# Patient Record
Sex: Female | Born: 1960 | Race: Black or African American | Hispanic: No | Marital: Married | Smoking: Current every day smoker
Health system: Southern US, Community
[De-identification: ages and names within clinical notes are randomized; demographics above are authoritative.]

## PROBLEM LIST (undated history)

## (undated) DIAGNOSIS — F32A Depression, unspecified: Secondary | ICD-10-CM

## (undated) DIAGNOSIS — I10 Essential (primary) hypertension: Secondary | ICD-10-CM

## (undated) DIAGNOSIS — Z87442 Personal history of urinary calculi: Secondary | ICD-10-CM

## (undated) DIAGNOSIS — K219 Gastro-esophageal reflux disease without esophagitis: Secondary | ICD-10-CM

## (undated) DIAGNOSIS — C183 Malignant neoplasm of hepatic flexure: Secondary | ICD-10-CM

## (undated) DIAGNOSIS — D649 Anemia, unspecified: Secondary | ICD-10-CM

## (undated) DIAGNOSIS — M1990 Unspecified osteoarthritis, unspecified site: Secondary | ICD-10-CM

## (undated) DIAGNOSIS — F329 Major depressive disorder, single episode, unspecified: Secondary | ICD-10-CM

## (undated) DIAGNOSIS — F1010 Alcohol abuse, uncomplicated: Secondary | ICD-10-CM

## (undated) DIAGNOSIS — F1910 Other psychoactive substance abuse, uncomplicated: Secondary | ICD-10-CM

## (undated) DIAGNOSIS — G4730 Sleep apnea, unspecified: Secondary | ICD-10-CM

## (undated) DIAGNOSIS — G4700 Insomnia, unspecified: Secondary | ICD-10-CM

## (undated) DIAGNOSIS — J449 Chronic obstructive pulmonary disease, unspecified: Secondary | ICD-10-CM

## (undated) DIAGNOSIS — I2699 Other pulmonary embolism without acute cor pulmonale: Secondary | ICD-10-CM

## (undated) DIAGNOSIS — F419 Anxiety disorder, unspecified: Secondary | ICD-10-CM

## (undated) DIAGNOSIS — R634 Abnormal weight loss: Secondary | ICD-10-CM

## (undated) HISTORY — DX: Depression, unspecified: F32.A

## (undated) HISTORY — DX: Alcohol abuse, uncomplicated: F10.10

## (undated) HISTORY — DX: Abnormal weight loss: R63.4

## (undated) HISTORY — DX: Major depressive disorder, single episode, unspecified: F32.9

## (undated) HISTORY — PX: CYST EXCISION: SHX5701

## (undated) HISTORY — PX: ABDOMINAL HYSTERECTOMY: SHX81

## (undated) HISTORY — DX: Insomnia, unspecified: G47.00

## (undated) HISTORY — PX: LITHOTRIPSY: SUR834

## (undated) HISTORY — DX: Other psychoactive substance abuse, uncomplicated: F19.10

## (undated) HISTORY — DX: Anxiety disorder, unspecified: F41.9

## (undated) HISTORY — DX: Other pulmonary embolism without acute cor pulmonale: I26.99

---

## 1999-01-05 ENCOUNTER — Inpatient Hospital Stay (HOSPITAL_COMMUNITY): Admission: AD | Admit: 1999-01-05 | Discharge: 1999-01-05 | Payer: Self-pay | Admitting: Obstetrics

## 1999-01-12 ENCOUNTER — Inpatient Hospital Stay (HOSPITAL_COMMUNITY): Admission: AD | Admit: 1999-01-12 | Discharge: 1999-01-12 | Payer: Self-pay | Admitting: Obstetrics

## 1999-01-14 ENCOUNTER — Ambulatory Visit (HOSPITAL_COMMUNITY): Admission: AD | Admit: 1999-01-14 | Discharge: 1999-01-14 | Payer: Self-pay | Admitting: *Deleted

## 1999-01-20 ENCOUNTER — Encounter: Admission: RE | Admit: 1999-01-20 | Discharge: 1999-01-20 | Payer: Self-pay | Admitting: Obstetrics

## 2000-03-15 ENCOUNTER — Inpatient Hospital Stay (HOSPITAL_COMMUNITY): Admission: AD | Admit: 2000-03-15 | Discharge: 2000-03-15 | Payer: Self-pay | Admitting: *Deleted

## 2000-03-17 ENCOUNTER — Inpatient Hospital Stay (HOSPITAL_COMMUNITY): Admission: EM | Admit: 2000-03-17 | Discharge: 2000-03-17 | Payer: Self-pay | Admitting: *Deleted

## 2000-03-24 ENCOUNTER — Inpatient Hospital Stay (HOSPITAL_COMMUNITY): Admission: EM | Admit: 2000-03-24 | Discharge: 2000-03-24 | Payer: Self-pay | Admitting: Obstetrics & Gynecology

## 2000-03-29 ENCOUNTER — Encounter: Admission: RE | Admit: 2000-03-29 | Discharge: 2000-03-29 | Payer: Self-pay | Admitting: Obstetrics & Gynecology

## 2001-07-22 ENCOUNTER — Inpatient Hospital Stay (HOSPITAL_COMMUNITY): Admission: AD | Admit: 2001-07-22 | Discharge: 2001-07-22 | Payer: Self-pay | Admitting: *Deleted

## 2001-07-25 ENCOUNTER — Encounter: Admission: RE | Admit: 2001-07-25 | Discharge: 2001-07-25 | Payer: Self-pay | Admitting: Obstetrics & Gynecology

## 2001-07-26 ENCOUNTER — Ambulatory Visit (HOSPITAL_COMMUNITY): Admission: RE | Admit: 2001-07-26 | Discharge: 2001-07-26 | Payer: Self-pay | Admitting: Obstetrics

## 2001-08-18 ENCOUNTER — Emergency Department (HOSPITAL_COMMUNITY): Admission: EM | Admit: 2001-08-18 | Discharge: 2001-08-18 | Payer: Self-pay | Admitting: Emergency Medicine

## 2001-10-31 ENCOUNTER — Emergency Department (HOSPITAL_COMMUNITY): Admission: EM | Admit: 2001-10-31 | Discharge: 2001-10-31 | Payer: Self-pay

## 2003-02-20 ENCOUNTER — Emergency Department (HOSPITAL_COMMUNITY): Admission: EM | Admit: 2003-02-20 | Discharge: 2003-02-20 | Payer: Self-pay | Admitting: Emergency Medicine

## 2003-03-14 ENCOUNTER — Emergency Department (HOSPITAL_COMMUNITY): Admission: EM | Admit: 2003-03-14 | Discharge: 2003-03-14 | Payer: Self-pay | Admitting: Emergency Medicine

## 2003-03-26 ENCOUNTER — Encounter: Admission: RE | Admit: 2003-03-26 | Discharge: 2003-03-26 | Payer: Self-pay | Admitting: Internal Medicine

## 2003-04-16 ENCOUNTER — Encounter: Admission: RE | Admit: 2003-04-16 | Discharge: 2003-04-16 | Payer: Self-pay | Admitting: Internal Medicine

## 2003-05-21 ENCOUNTER — Encounter: Admission: RE | Admit: 2003-05-21 | Discharge: 2003-05-21 | Payer: Self-pay | Admitting: Internal Medicine

## 2003-06-25 ENCOUNTER — Encounter (INDEPENDENT_AMBULATORY_CARE_PROVIDER_SITE_OTHER): Payer: Self-pay

## 2003-06-25 ENCOUNTER — Encounter: Admission: RE | Admit: 2003-06-25 | Discharge: 2003-06-25 | Payer: Self-pay | Admitting: Obstetrics and Gynecology

## 2003-06-25 ENCOUNTER — Other Ambulatory Visit: Admission: RE | Admit: 2003-06-25 | Discharge: 2003-06-25 | Payer: Self-pay | Admitting: *Deleted

## 2003-06-25 ENCOUNTER — Encounter (INDEPENDENT_AMBULATORY_CARE_PROVIDER_SITE_OTHER): Payer: Self-pay | Admitting: *Deleted

## 2003-07-04 ENCOUNTER — Encounter: Admission: RE | Admit: 2003-07-04 | Discharge: 2003-07-04 | Payer: Self-pay | Admitting: Internal Medicine

## 2003-07-16 ENCOUNTER — Encounter: Admission: RE | Admit: 2003-07-16 | Discharge: 2003-07-16 | Payer: Self-pay | Admitting: Obstetrics and Gynecology

## 2003-08-05 ENCOUNTER — Encounter: Admission: RE | Admit: 2003-08-05 | Discharge: 2003-08-05 | Payer: Self-pay | Admitting: Internal Medicine

## 2003-09-02 ENCOUNTER — Emergency Department (HOSPITAL_COMMUNITY): Admission: EM | Admit: 2003-09-02 | Discharge: 2003-09-02 | Payer: Self-pay | Admitting: *Deleted

## 2003-09-06 ENCOUNTER — Encounter: Admission: RE | Admit: 2003-09-06 | Discharge: 2003-09-06 | Payer: Self-pay | Admitting: Obstetrics and Gynecology

## 2003-09-19 ENCOUNTER — Encounter: Admission: RE | Admit: 2003-09-19 | Discharge: 2003-09-19 | Payer: Self-pay | Admitting: Obstetrics and Gynecology

## 2003-10-17 ENCOUNTER — Encounter: Admission: RE | Admit: 2003-10-17 | Discharge: 2003-10-17 | Payer: Self-pay | Admitting: Obstetrics and Gynecology

## 2003-10-21 ENCOUNTER — Encounter (INDEPENDENT_AMBULATORY_CARE_PROVIDER_SITE_OTHER): Payer: Self-pay | Admitting: Specialist

## 2003-10-21 ENCOUNTER — Inpatient Hospital Stay (HOSPITAL_COMMUNITY): Admission: RE | Admit: 2003-10-21 | Discharge: 2003-10-23 | Payer: Self-pay | Admitting: Obstetrics & Gynecology

## 2003-10-28 ENCOUNTER — Inpatient Hospital Stay (HOSPITAL_COMMUNITY): Admission: AD | Admit: 2003-10-28 | Discharge: 2003-10-28 | Payer: Self-pay | Admitting: Obstetrics and Gynecology

## 2003-11-14 ENCOUNTER — Encounter: Admission: RE | Admit: 2003-11-14 | Discharge: 2003-11-14 | Payer: Self-pay | Admitting: Obstetrics and Gynecology

## 2004-02-11 ENCOUNTER — Encounter: Admission: RE | Admit: 2004-02-11 | Discharge: 2004-02-11 | Payer: Self-pay | Admitting: Obstetrics and Gynecology

## 2004-03-14 ENCOUNTER — Inpatient Hospital Stay (HOSPITAL_COMMUNITY): Admission: EM | Admit: 2004-03-14 | Discharge: 2004-03-17 | Payer: Self-pay | Admitting: Emergency Medicine

## 2004-03-14 IMAGING — CT CT ANGIO CHEST
1 of 3 series · 18 of 30 positions shown · IV contrast (omnipaque)
Comparison: none

CLINICAL DATA: Left chest pain for one day.  Shortness of breath this morning.  Smoker.
 CHEST CT ANGIO, 03/14/04
 A pulmonary embolism chest CT angio was performed with 145 cc of Omnipaque 300 intravenous contrast.  These demonstrate a long segment, branching left lower lobe pulmonary embolus.  No other pulmonary emboli are seen.  Posterior and lateral left lower lobe air-space opacity is noted.  Also noted are changes of COPD.  Right apical parenchymal scar formation is demonstrated, including a 9 x 5 mm irregular masslike density and an 8 x 4 mm irregular masslike density.  No enlarged lymph nodes.  Unremarkable bones.
 IMPRESSION 
 Long segment left lower lobe pulmonary embolus with an associated left lower lobe pulmonary infarct.
 Right apical parenchymal scar formation, including two small, irregular masslike densities.  A follow-up chest CT is recommended in 3 months to assess stability (exclude an enlarging neoplasm). 
 COPD.

[Series 4: chest/pe 1.0 b10f · axial · 0.56mm/px · z∈[-211,-27]mm · 18 of 208 slices shown]
[im 12/208  lung]
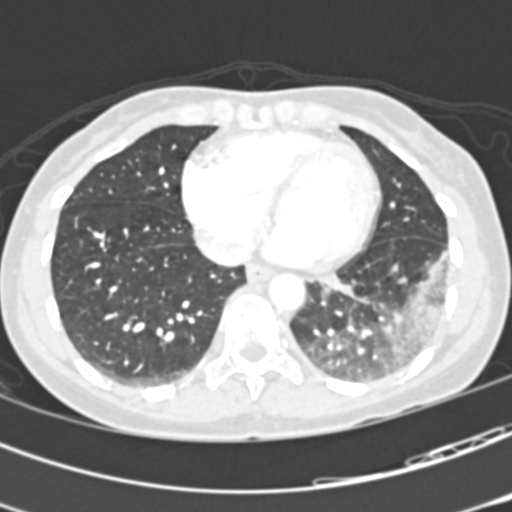
[im 24/208  mediastinal]
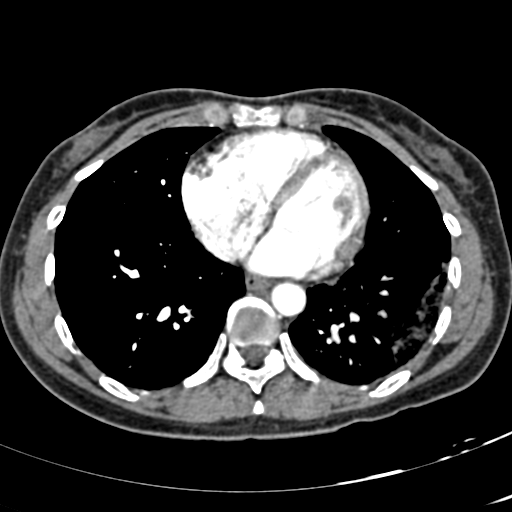
[im 35/208  lung]
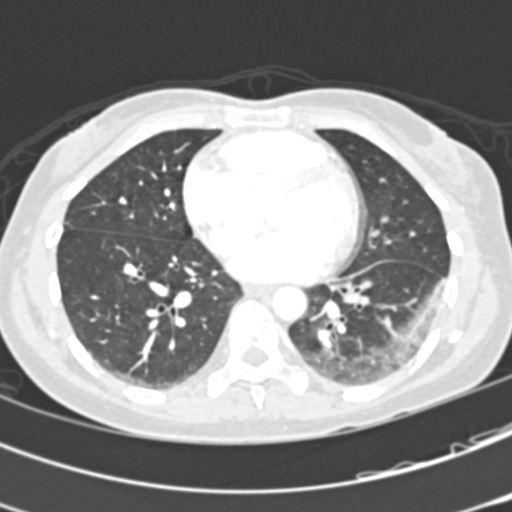
[im 43/208  mediastinal]
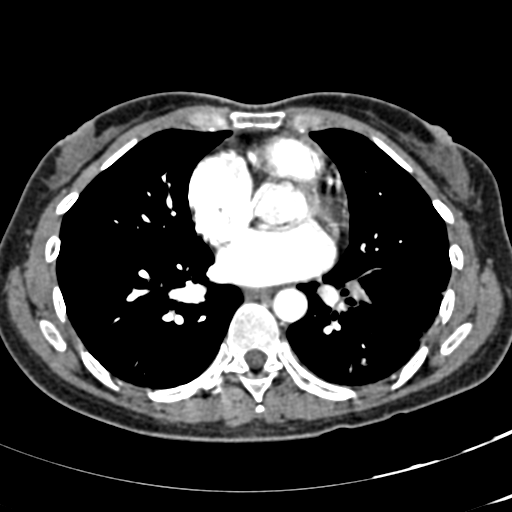
[im 47/208  lung]
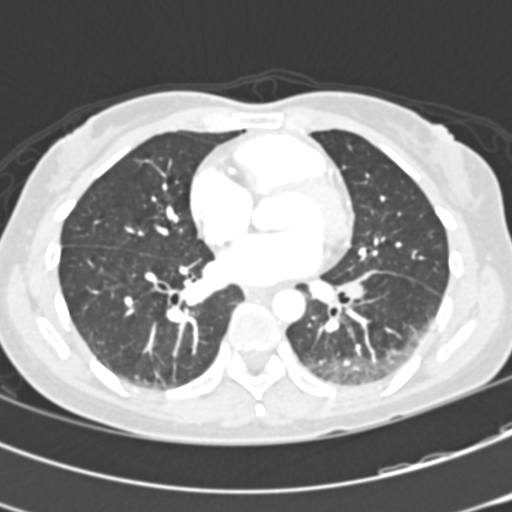
[im 58/208  mediastinal]
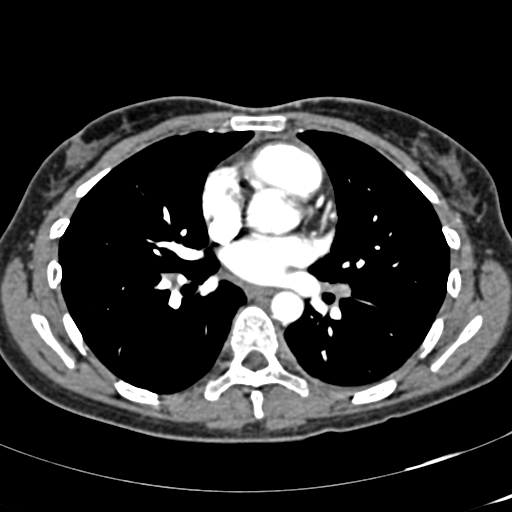
[im 70/208  lung]
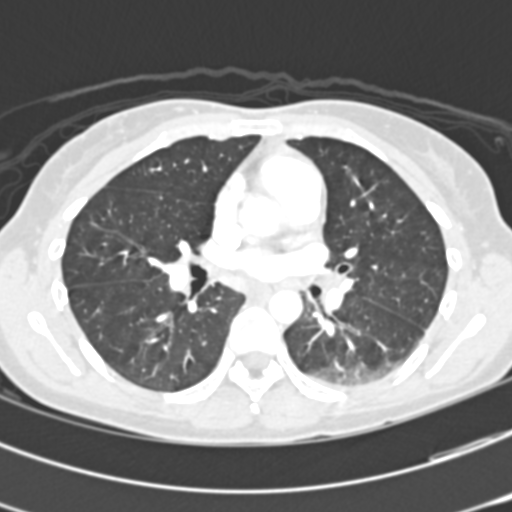
[im 81/208  mediastinal]
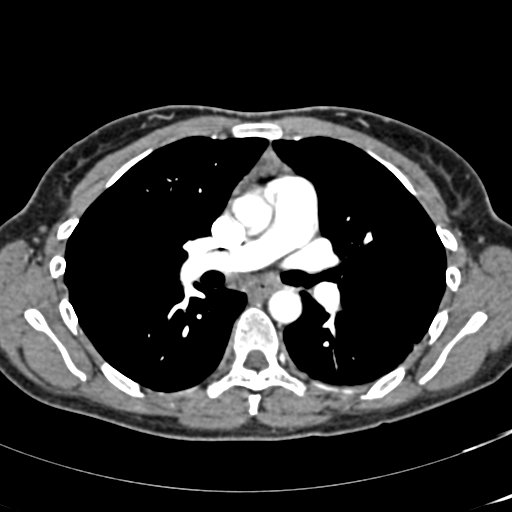
[im 93/208  lung]
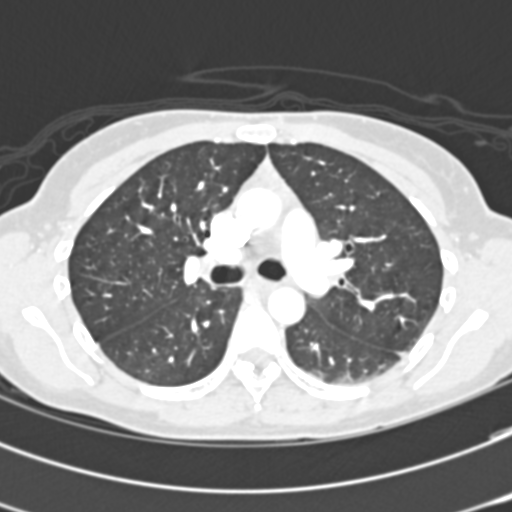
[im 104/208  mediastinal]
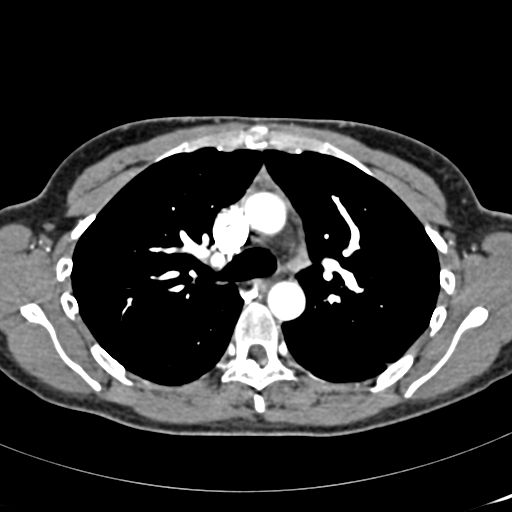
[im 116/208  lung]
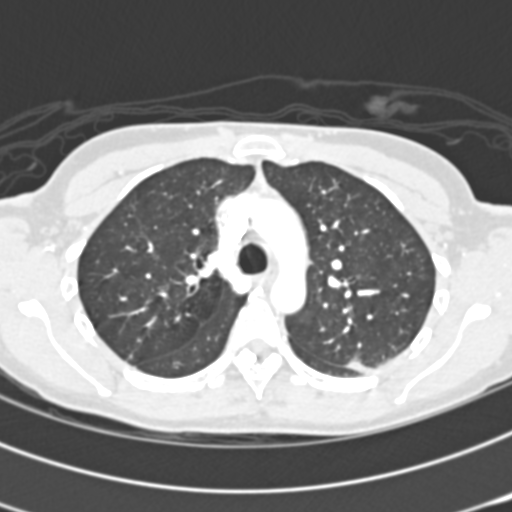
[im 127/208  mediastinal]
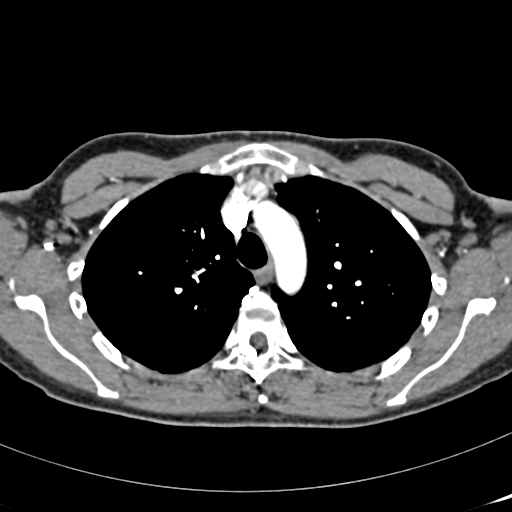
[im 139/208  lung]
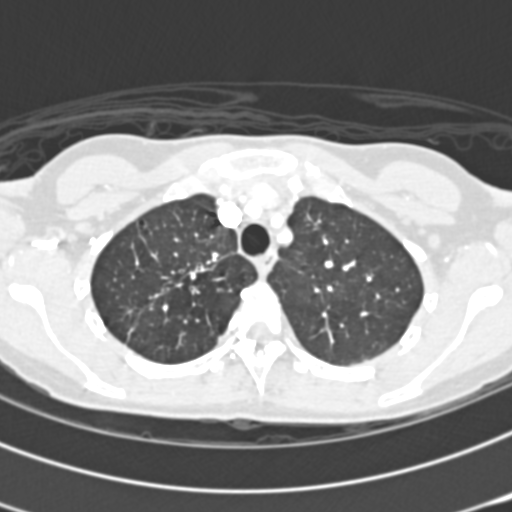
[im 150/208  mediastinal]
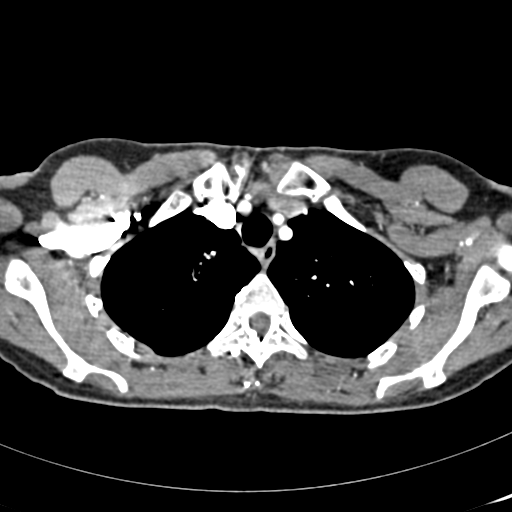
[im 162/208  lung]
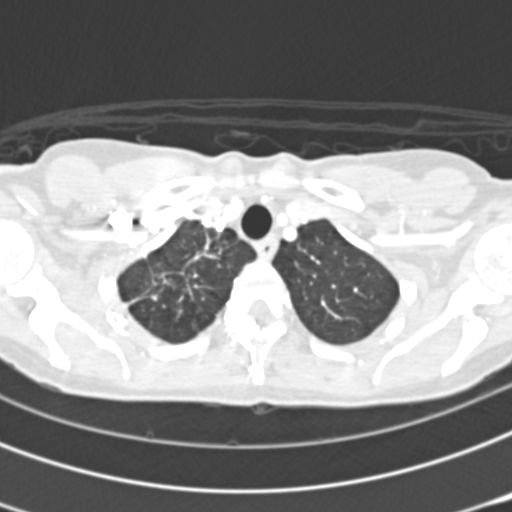
[im 173/208  mediastinal]
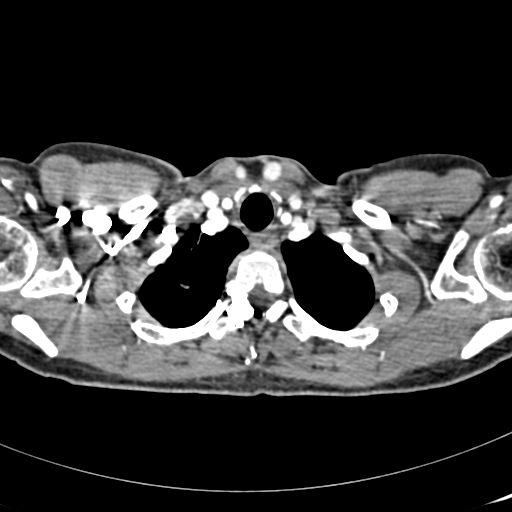
[im 185/208  lung]
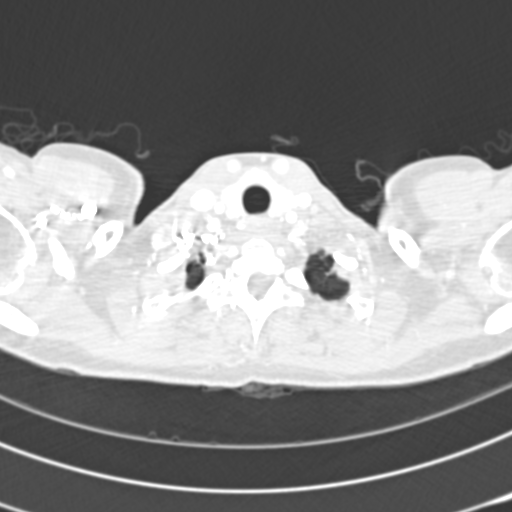
[im 196/208  mediastinal]
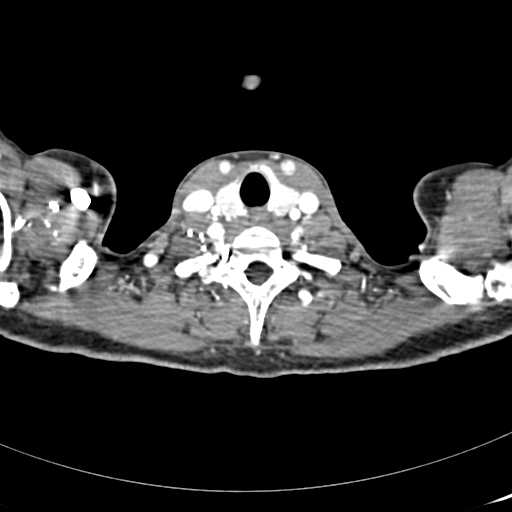

[18 of 30 positions shown; findings below may reference images not displayed]

## 2004-03-14 IMAGING — CR DG CHEST 2V
2 series · 2 of 2 positions shown · non-contrast
Comparison: none

CLINICAL DATA: Chest pain.  Dyspnea.  Smoker. 
 CHEST, TWO VIEWS 
 No prior study for comparison.  Mild cardiac enlargement.  Mediastinal contours and vascularity normal.  Changes of bronchitis with left basilar infiltrate.  Remaining lungs clear.  No acute bone lesions. 
 IMPRESSION
 Bronchitic changes with left basilar infiltrate.

[view not recorded (1 of 2)]
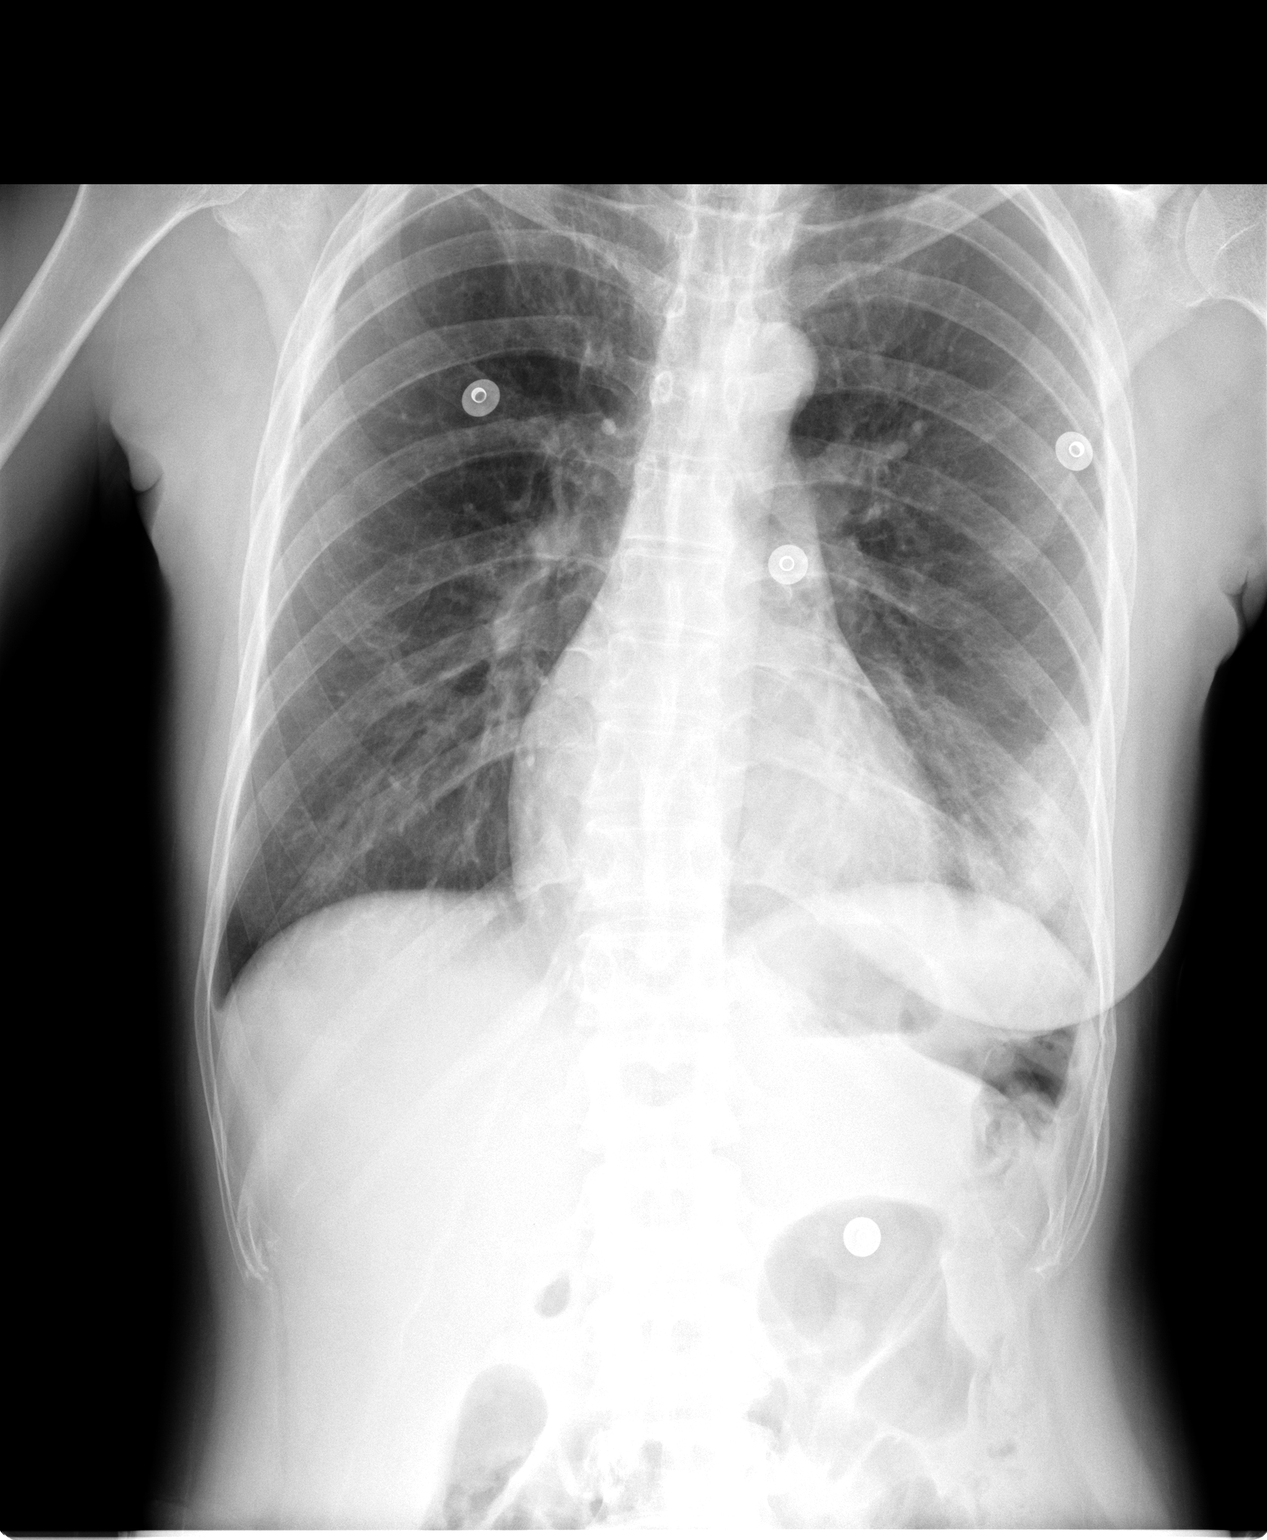

[view not recorded (2 of 2)]
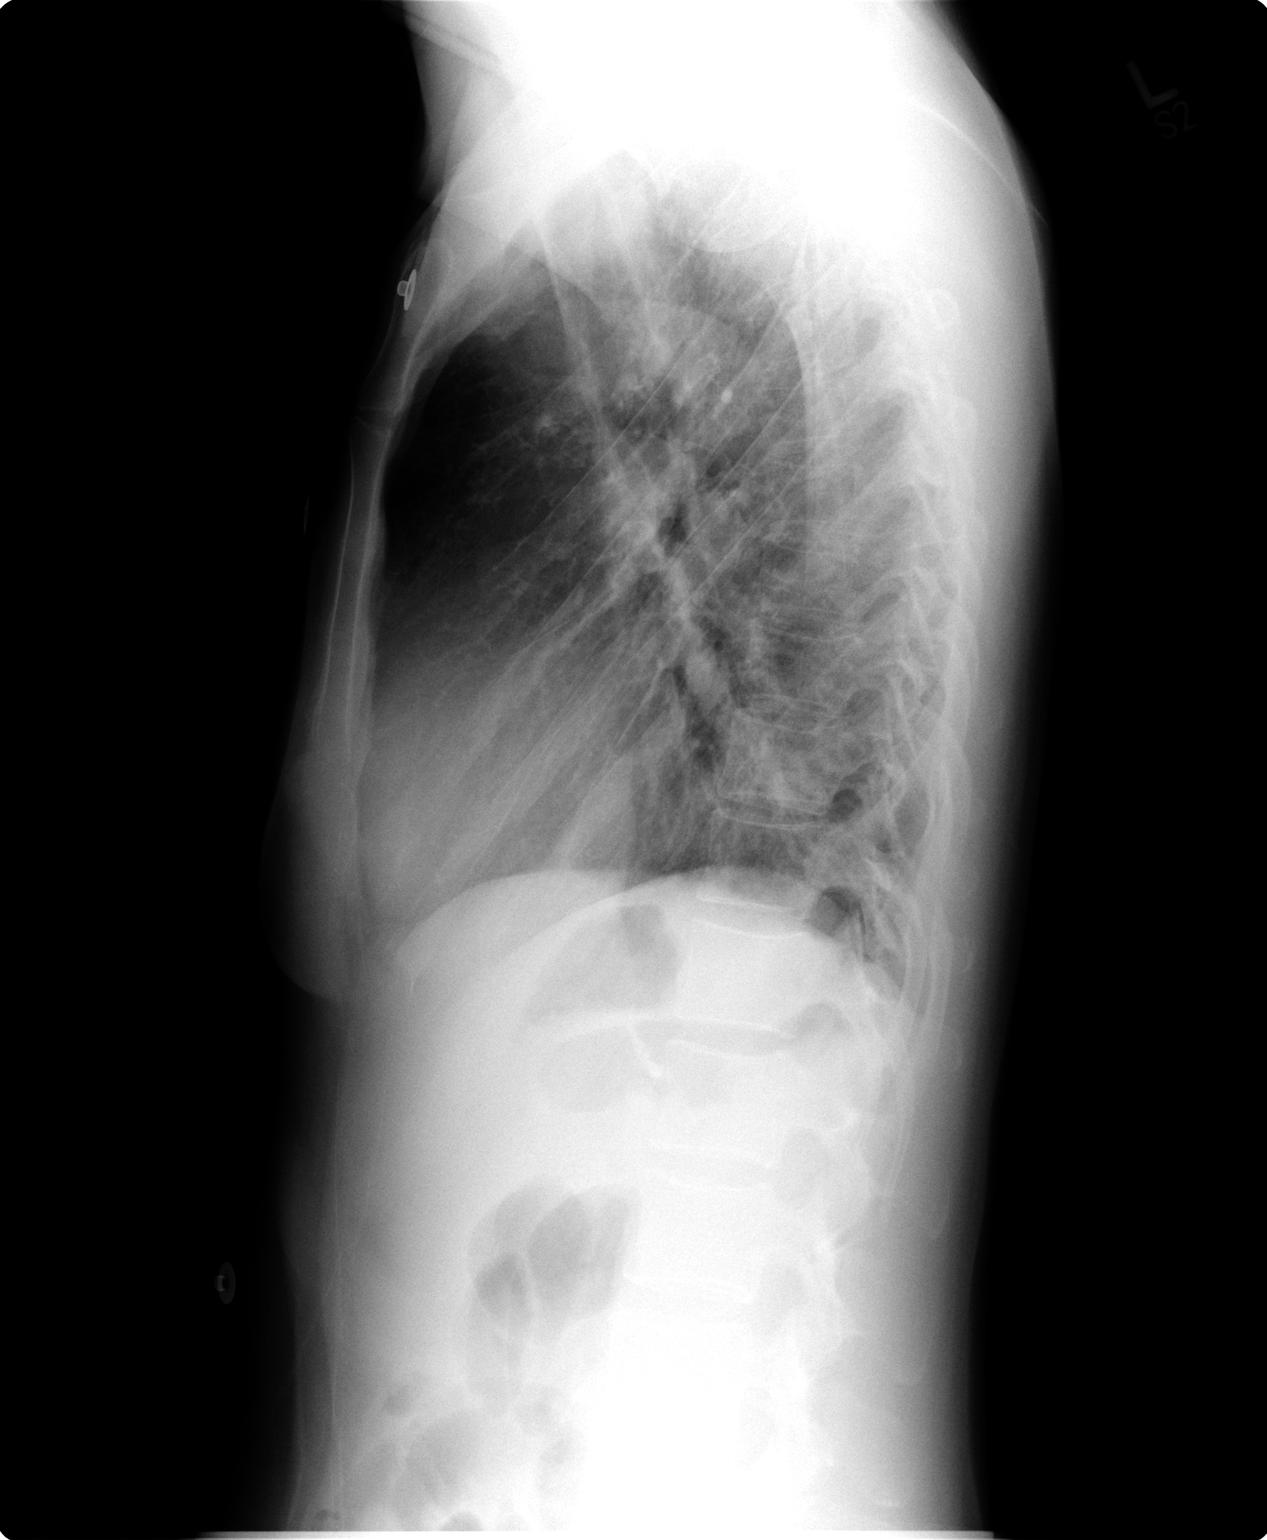

[2 of 2 positions shown; findings below may reference images not displayed]

## 2004-03-19 ENCOUNTER — Encounter: Admission: RE | Admit: 2004-03-19 | Discharge: 2004-03-19 | Payer: Self-pay | Admitting: Internal Medicine

## 2004-03-19 ENCOUNTER — Inpatient Hospital Stay (HOSPITAL_COMMUNITY): Admission: AD | Admit: 2004-03-19 | Discharge: 2004-03-24 | Payer: Self-pay | Admitting: Internal Medicine
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
## 2004-03-26 ENCOUNTER — Encounter: Admission: RE | Admit: 2004-03-26 | Discharge: 2004-03-26 | Payer: Self-pay | Admitting: Internal Medicine

## 2004-04-06 ENCOUNTER — Encounter: Admission: RE | Admit: 2004-04-06 | Discharge: 2004-04-06 | Payer: Self-pay | Admitting: Internal Medicine

## 2004-04-20 ENCOUNTER — Encounter: Admission: RE | Admit: 2004-04-20 | Discharge: 2004-04-20 | Payer: Self-pay | Admitting: Internal Medicine

## 2004-04-24 ENCOUNTER — Ambulatory Visit: Payer: Self-pay | Admitting: Internal Medicine

## 2004-05-06 ENCOUNTER — Ambulatory Visit: Payer: Self-pay | Admitting: Internal Medicine

## 2004-05-18 ENCOUNTER — Ambulatory Visit: Payer: Self-pay | Admitting: Internal Medicine

## 2004-05-25 ENCOUNTER — Ambulatory Visit: Payer: Self-pay | Admitting: Internal Medicine

## 2004-05-27 ENCOUNTER — Ambulatory Visit: Payer: Self-pay | Admitting: Internal Medicine

## 2004-06-15 ENCOUNTER — Ambulatory Visit: Payer: Self-pay | Admitting: Internal Medicine

## 2004-06-17 ENCOUNTER — Ambulatory Visit: Payer: Self-pay | Admitting: Internal Medicine

## 2004-06-18 ENCOUNTER — Ambulatory Visit (HOSPITAL_COMMUNITY): Admission: RE | Admit: 2004-06-18 | Discharge: 2004-06-18 | Payer: Self-pay | Admitting: Internal Medicine

## 2004-06-18 IMAGING — CT CT CHEST W/ CM
1 series · 15 of 31 positions shown, 19 images · IV contrast (100 ML OMNI 300)
Comparison: none

CLINICAL DATA: Right apical lung mass is seen on previous CT of 03/14/2004. Followup study. Patient
has been having hemoptysis for 3 days. Occasional shortness of breath. History of smoking,
bleeding gums. Previous history of left lower lobe pulmonary embolus which was seen on the CT scan
of 03/14/04.

[Series 2: chest w/ · axial · 0.59mm/px · z∈[-249,-9]mm · 15 of 52 slices shown, 19 images]
[im 2/52  mediastinal]
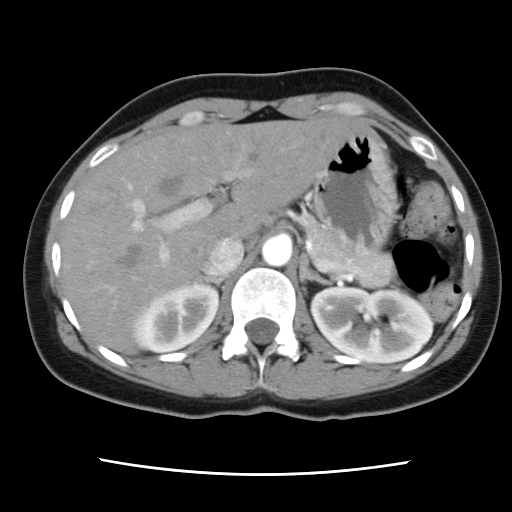
[im 2/52  lung]
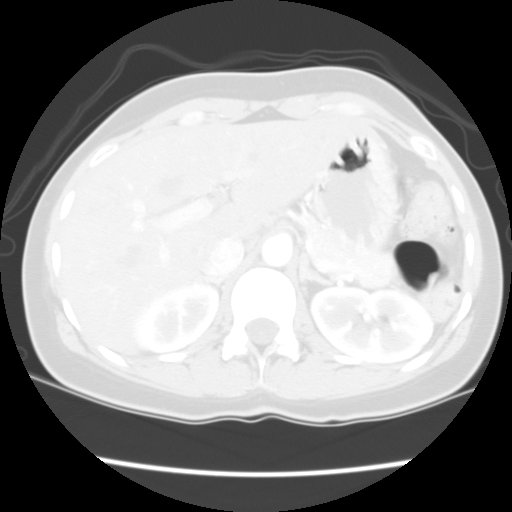
[im 6/52  lung]
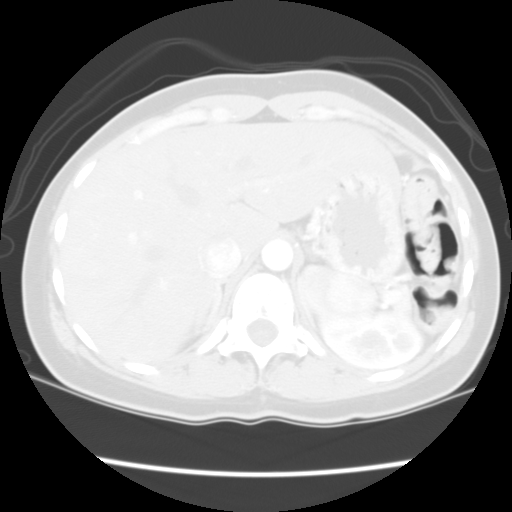
[im 10/52  lung]
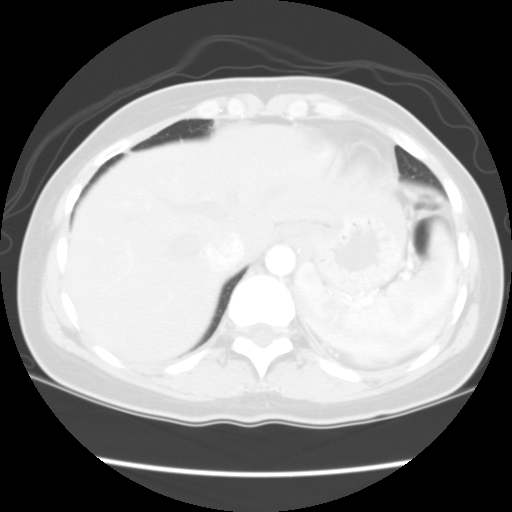
[im 12/52  lung]
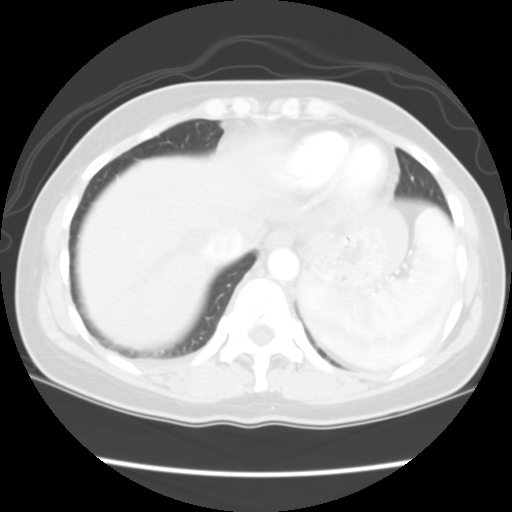
[im 16/52  mediastinal]
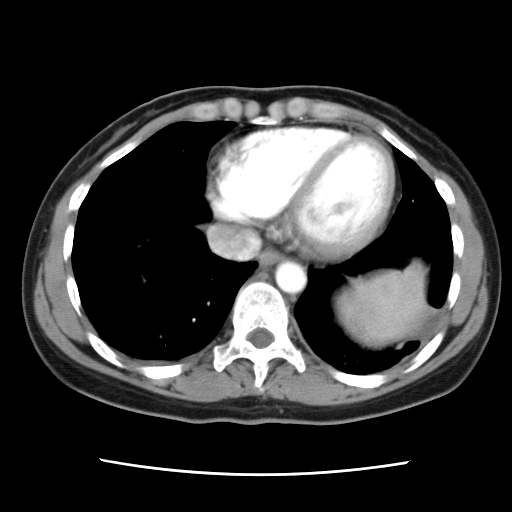
[im 16/52  lung]
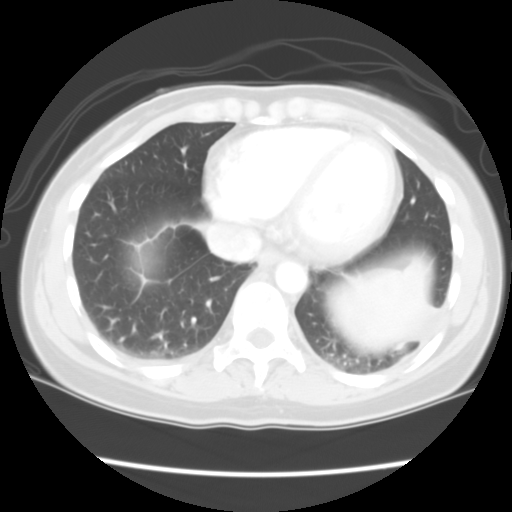
[im 19/52  lung]
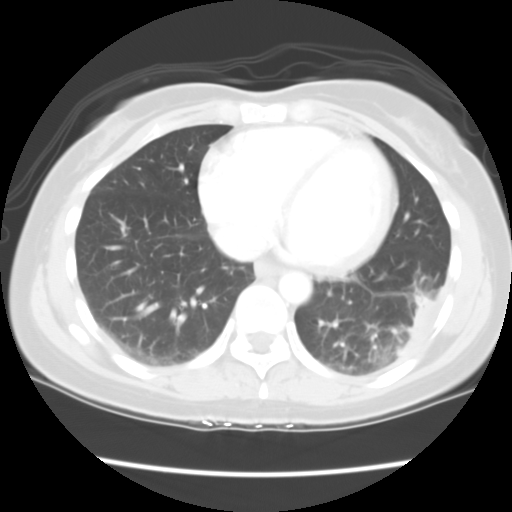
[im 23/52  lung]
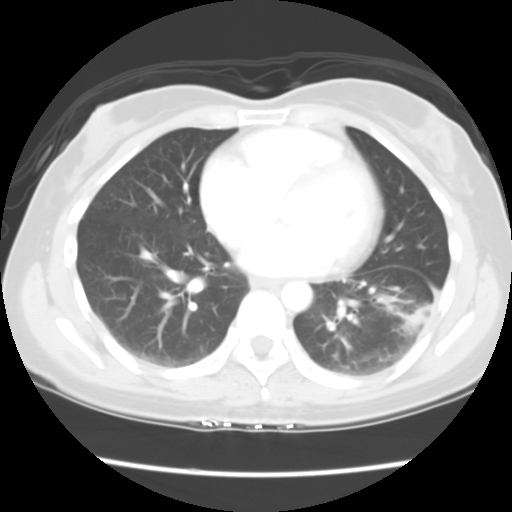
[im 27/52  lung]
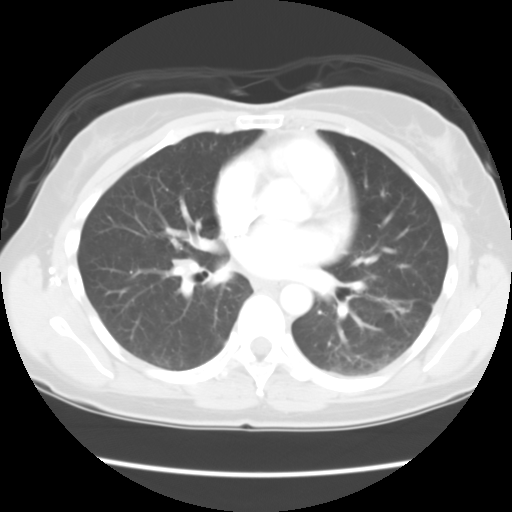
[im 29/52  mediastinal]
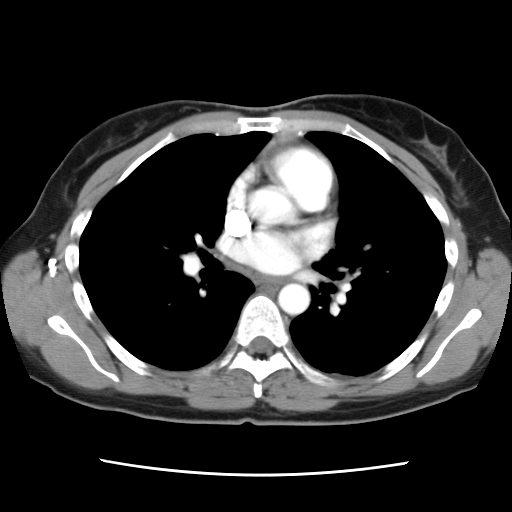
[im 29/52  lung]
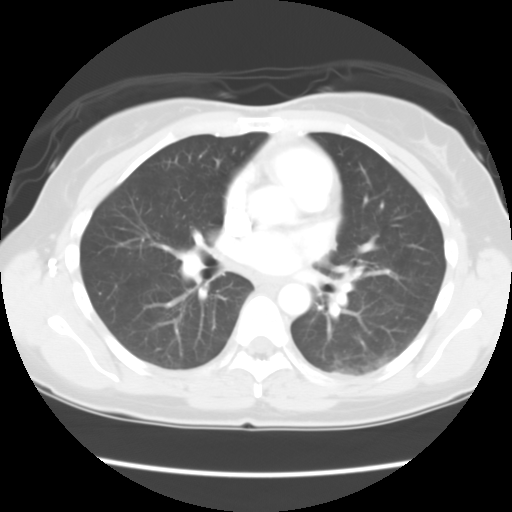
[im 33/52  lung]
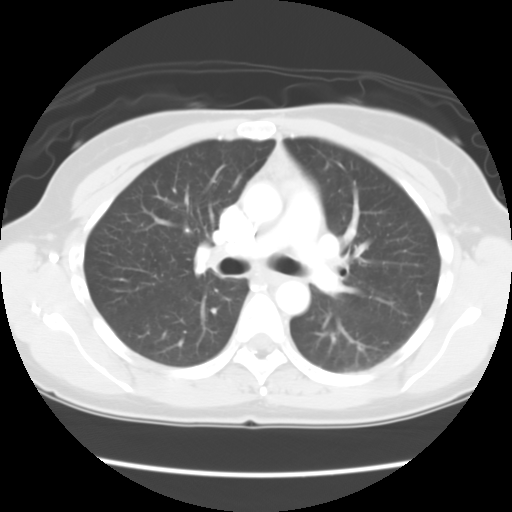
[im 36/52  lung]
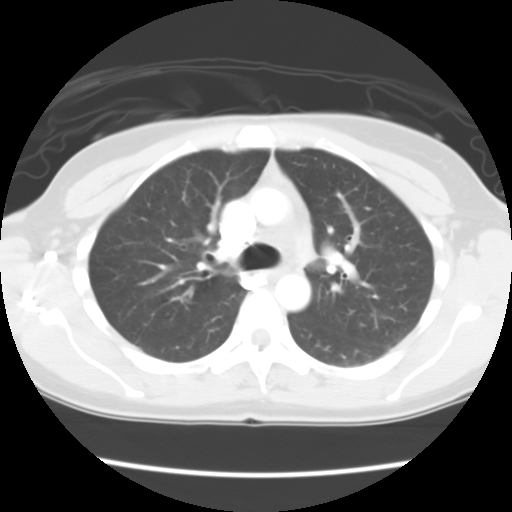
[im 40/52  lung]
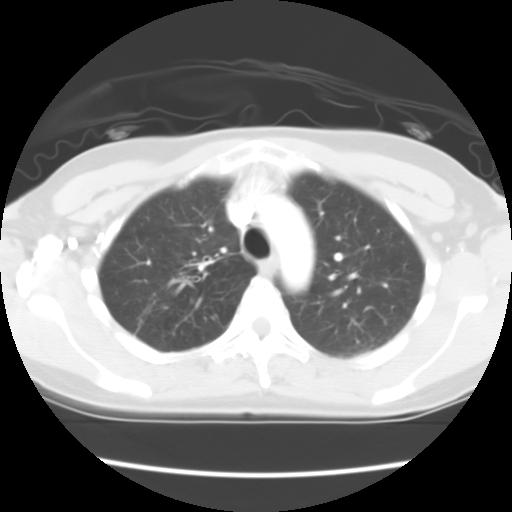
[im 42/52  mediastinal]
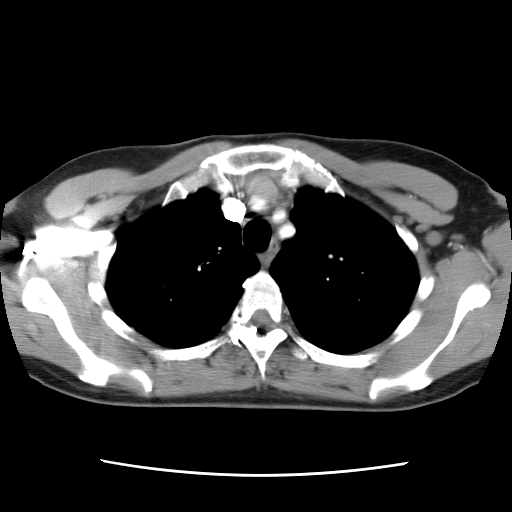
[im 42/52  lung]
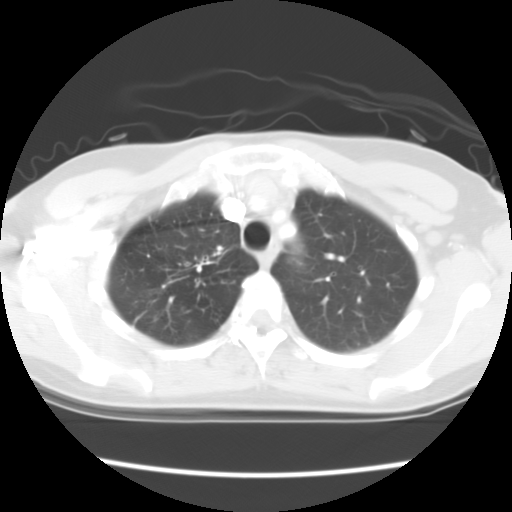
[im 46/52  lung]
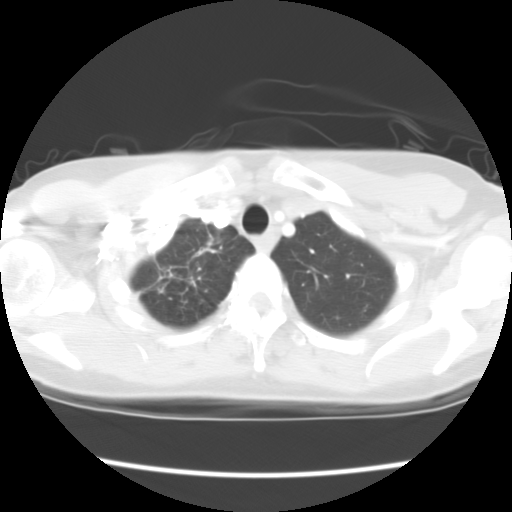
[im 50/52  lung]
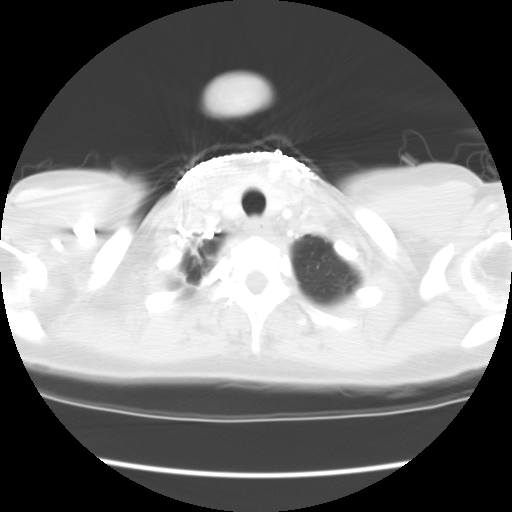

[15 of 31 positions shown; findings below may reference images not displayed]

CT CHEST WITH CONTRAST 

The patient was given 100 ml Mmnipaque-5SS and a series of scans for the entire chest were made and
show no interval change in the densities associated with the right lung apex. Both densities are
slightly smaller than previous. No new lesions are seen. These changes appear to be scars in the
right apices. In addition, there is noted at the left base in the pleural region a scar and some
thickening of the pleura and scarring of the lung parenchyma in the region that was supplied by the
area of the previous pulmonary embolus in a subsegmental area of the left lower lobe. This study
was not done for pulmonary emboli but no definite emboli are seen and the previously seen clot in
the left lower lobe now appears to have resolved. There nevertheless appears to be an area of
pulmonary infarction and pleural thickening in the area that was supplied by that branch of the
left lower pulmonary artery. Heart and mediastinum appear normal. Bony thorax is normal.

IMPRESSION 

Stable to slightly smaller densities right lung apex most consistent with apical scars.

There is an area of increased density which appears to probably have been associated with a
subsegmental infarct caused by the previously noted pulmonary embolus of 03/14/2004.

## 2004-06-22 ENCOUNTER — Ambulatory Visit: Payer: Self-pay | Admitting: Internal Medicine

## 2004-06-30 ENCOUNTER — Ambulatory Visit: Payer: Self-pay | Admitting: Internal Medicine

## 2004-07-06 ENCOUNTER — Ambulatory Visit: Payer: Self-pay | Admitting: Internal Medicine

## 2004-07-20 ENCOUNTER — Ambulatory Visit: Payer: Self-pay | Admitting: Internal Medicine

## 2004-08-10 ENCOUNTER — Ambulatory Visit: Payer: Self-pay | Admitting: Internal Medicine

## 2004-08-23 DIAGNOSIS — I2699 Other pulmonary embolism without acute cor pulmonale: Secondary | ICD-10-CM

## 2004-08-23 HISTORY — DX: Other pulmonary embolism without acute cor pulmonale: I26.99

## 2004-08-31 ENCOUNTER — Ambulatory Visit: Payer: Self-pay | Admitting: Internal Medicine

## 2004-09-03 ENCOUNTER — Ambulatory Visit: Payer: Self-pay | Admitting: Obstetrics and Gynecology

## 2004-09-21 ENCOUNTER — Ambulatory Visit: Payer: Self-pay | Admitting: Internal Medicine

## 2004-10-30 ENCOUNTER — Ambulatory Visit: Payer: Self-pay | Admitting: Internal Medicine

## 2004-11-09 ENCOUNTER — Ambulatory Visit: Payer: Self-pay | Admitting: Internal Medicine

## 2005-07-03 ENCOUNTER — Emergency Department (HOSPITAL_COMMUNITY): Admission: EM | Admit: 2005-07-03 | Discharge: 2005-07-03 | Payer: Self-pay | Admitting: Emergency Medicine
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
## 2006-03-09 ENCOUNTER — Ambulatory Visit: Payer: Self-pay | Admitting: Internal Medicine

## 2006-04-11 ENCOUNTER — Emergency Department (HOSPITAL_COMMUNITY): Admission: EM | Admit: 2006-04-11 | Discharge: 2006-04-11 | Payer: Self-pay | Admitting: Emergency Medicine

## 2006-09-14 ENCOUNTER — Emergency Department (HOSPITAL_COMMUNITY): Admission: EM | Admit: 2006-09-14 | Discharge: 2006-09-14 | Payer: Self-pay | Admitting: Emergency Medicine

## 2006-09-14 IMAGING — CR DG CHEST 2V
2 series · 2 of 2 positions shown · non-contrast
Comparison: 03/22/04.

CLINICAL DATA: 45 year-old with fever and flank pain.
 CHEST - 2 VIEW:

[w chest pa]
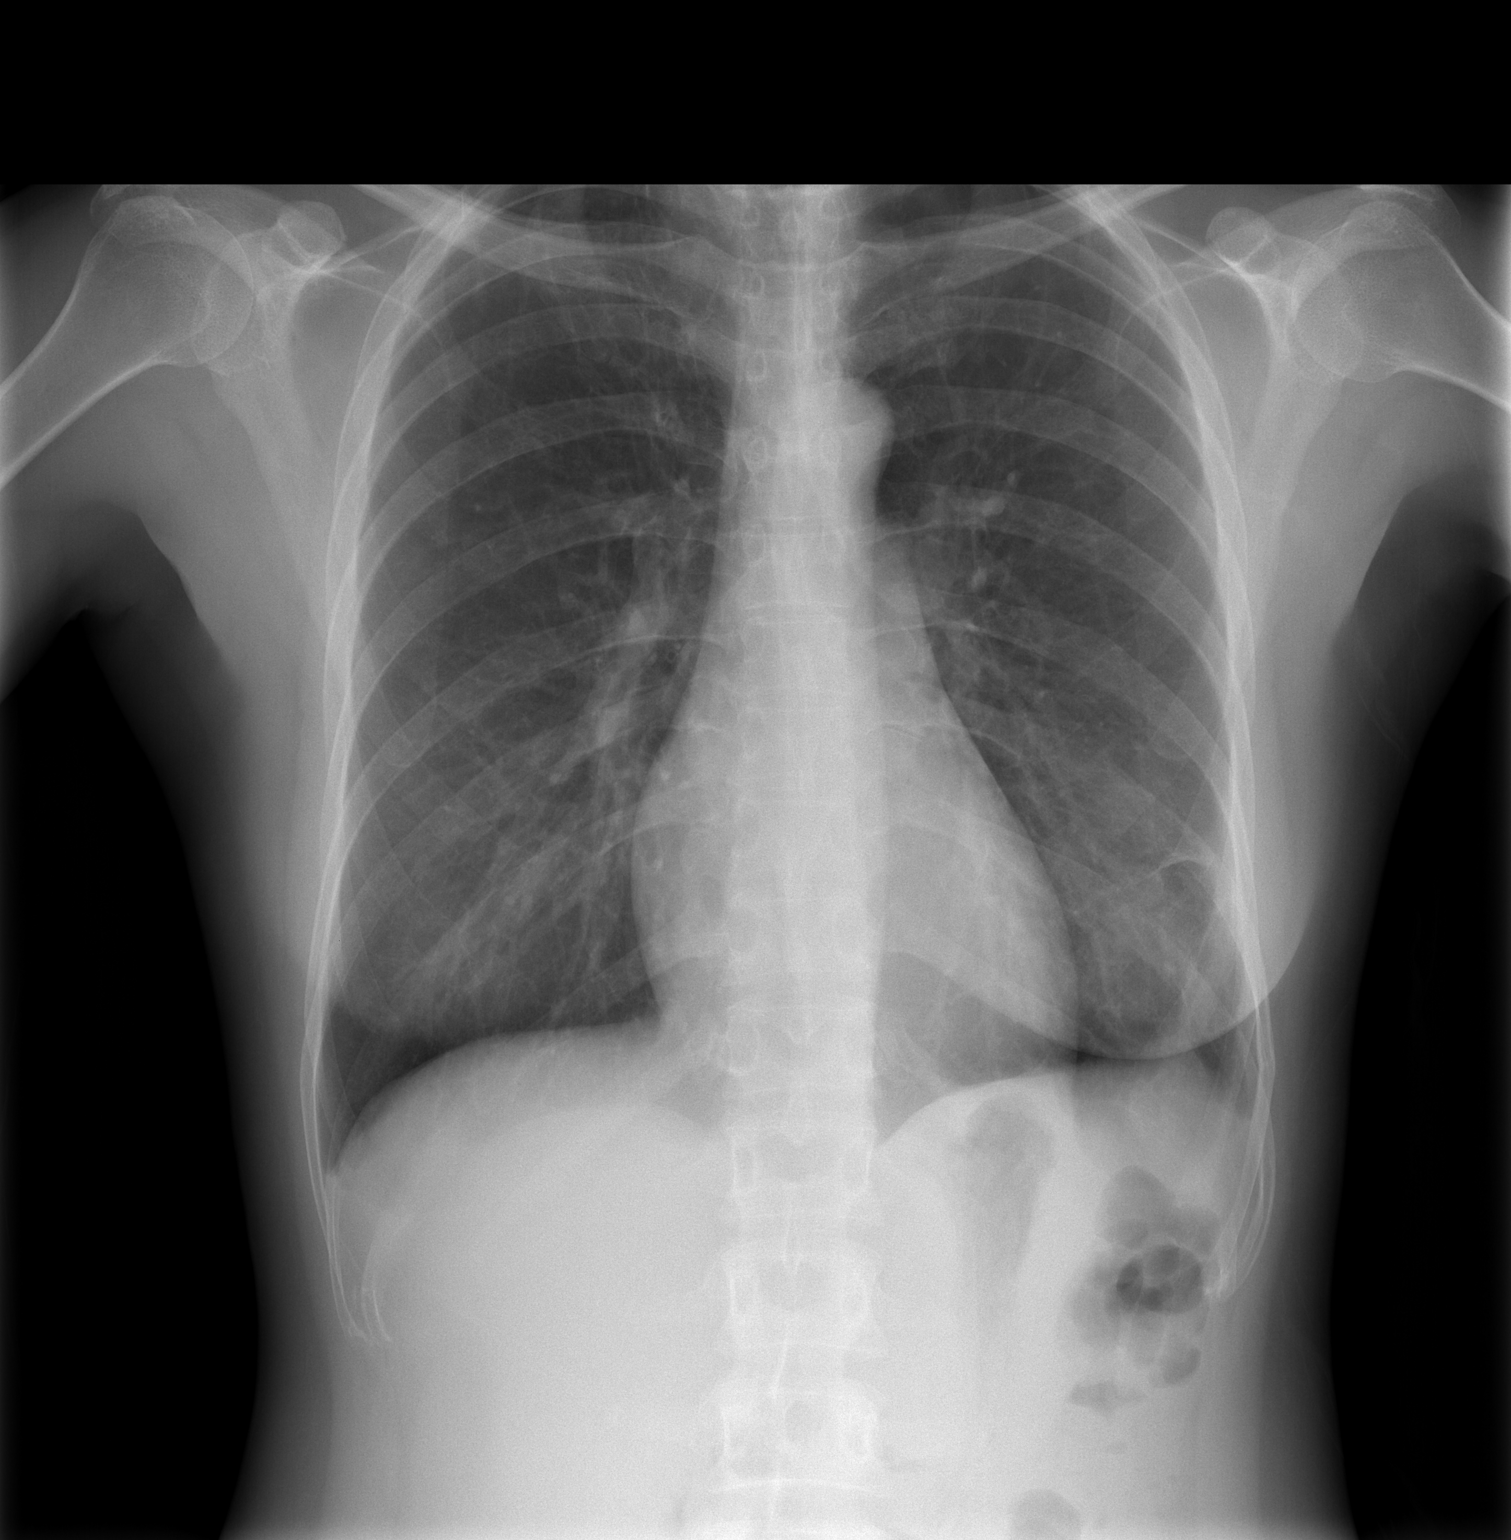

[w chest lat]
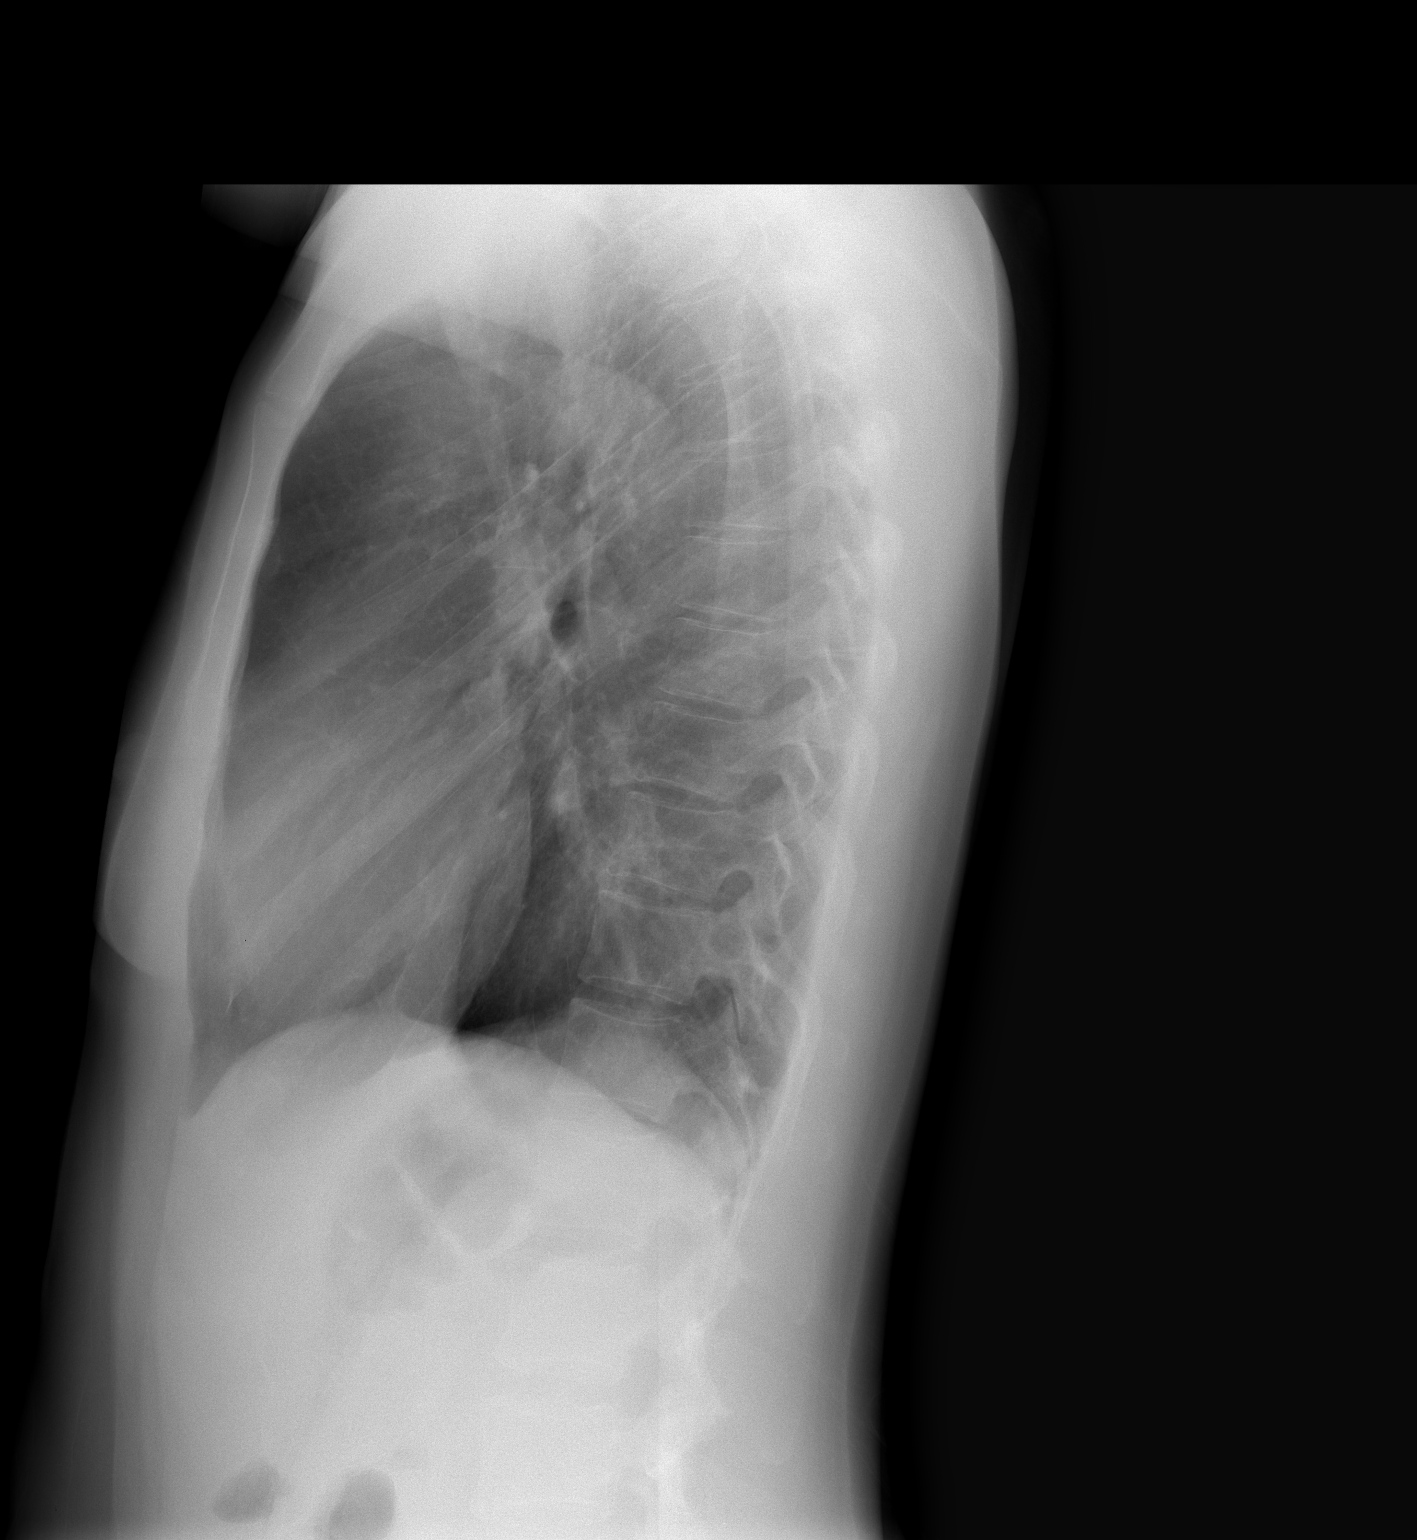

[2 of 2 positions shown; findings below may reference images not displayed]

FINDINGS: Cardiac silhouette, mediastinal contours are within normal limits. Left lower lobe scarring changes which are post pneumonic.  No acute pulmonary findings. Bony structures are intact.
IMPRESSION: Left lower lobe scarring changes.  No acute pulmonary findings.

## 2007-04-14 ENCOUNTER — Emergency Department (HOSPITAL_COMMUNITY): Admission: EM | Admit: 2007-04-14 | Discharge: 2007-04-14 | Payer: Self-pay | Admitting: Emergency Medicine

## 2007-06-29 ENCOUNTER — Ambulatory Visit: Payer: Self-pay | Admitting: Internal Medicine

## 2007-06-29 ENCOUNTER — Encounter (INDEPENDENT_AMBULATORY_CARE_PROVIDER_SITE_OTHER): Payer: Self-pay | Admitting: Internal Medicine

## 2007-06-29 DIAGNOSIS — F5102 Adjustment insomnia: Secondary | ICD-10-CM

## 2007-06-29 DIAGNOSIS — R630 Anorexia: Secondary | ICD-10-CM

## 2007-06-29 DIAGNOSIS — F3289 Other specified depressive episodes: Secondary | ICD-10-CM | POA: Insufficient documentation

## 2007-06-29 DIAGNOSIS — R634 Abnormal weight loss: Secondary | ICD-10-CM

## 2007-06-29 LAB — CONVERTED CEMR LAB
ALT: 11 units/L (ref 0–35)
AST: 13 units/L (ref 0–37)
Albumin: 4.2 g/dL (ref 3.5–5.2)
Alkaline Phosphatase: 57 units/L (ref 39–117)
BUN: 13 mg/dL (ref 6–23)
CO2: 28 meq/L (ref 19–32)
Calcium: 9.3 mg/dL (ref 8.4–10.5)
Chloride: 107 meq/L (ref 96–112)
Creatinine, Ser: 0.67 mg/dL (ref 0.40–1.20)
Glucose, Bld: 60 mg/dL — ABNORMAL LOW (ref 70–99)
HCT: 38.7 % (ref 36.0–46.0)
Hemoglobin: 12.9 g/dL (ref 12.0–15.0)
MCHC: 33.3 g/dL (ref 30.0–36.0)
MCV: 87 fL (ref 78.0–100.0)
Platelets: 245 10*3/uL (ref 150–400)
Potassium: 3.8 meq/L (ref 3.5–5.3)
RBC: 4.45 M/uL (ref 3.87–5.11)
RDW: 13 % (ref 11.5–14.0)
Sodium: 144 meq/L (ref 135–145)
Total Bilirubin: 0.4 mg/dL (ref 0.3–1.2)
Total Protein: 7.5 g/dL (ref 6.0–8.3)
WBC: 9 10*3/uL (ref 4.0–10.5)

## 2007-07-08 ENCOUNTER — Telehealth (INDEPENDENT_AMBULATORY_CARE_PROVIDER_SITE_OTHER): Payer: Self-pay | Admitting: *Deleted

## 2007-07-12 ENCOUNTER — Telehealth (INDEPENDENT_AMBULATORY_CARE_PROVIDER_SITE_OTHER): Payer: Self-pay | Admitting: Internal Medicine

## 2008-04-02 ENCOUNTER — Telehealth (INDEPENDENT_AMBULATORY_CARE_PROVIDER_SITE_OTHER): Payer: Self-pay | Admitting: *Deleted

## 2008-04-15 ENCOUNTER — Encounter (INDEPENDENT_AMBULATORY_CARE_PROVIDER_SITE_OTHER): Payer: Self-pay | Admitting: Internal Medicine

## 2008-04-15 ENCOUNTER — Ambulatory Visit: Payer: Self-pay | Admitting: Internal Medicine

## 2008-04-15 DIAGNOSIS — J301 Allergic rhinitis due to pollen: Secondary | ICD-10-CM

## 2008-04-15 DIAGNOSIS — N898 Other specified noninflammatory disorders of vagina: Secondary | ICD-10-CM | POA: Insufficient documentation

## 2008-04-16 ENCOUNTER — Telehealth: Payer: Self-pay | Admitting: Licensed Clinical Social Worker

## 2008-04-16 LAB — CONVERTED CEMR LAB
Candida species: NEGATIVE
Gardnerella vaginalis: POSITIVE — AB
Trichomonal Vaginitis: NEGATIVE

## 2008-05-27 ENCOUNTER — Ambulatory Visit: Payer: Self-pay | Admitting: Internal Medicine

## 2008-05-27 ENCOUNTER — Encounter (INDEPENDENT_AMBULATORY_CARE_PROVIDER_SITE_OTHER): Payer: Self-pay | Admitting: Internal Medicine

## 2008-05-27 ENCOUNTER — Ambulatory Visit (HOSPITAL_COMMUNITY): Admission: RE | Admit: 2008-05-27 | Discharge: 2008-05-27 | Payer: Self-pay | Admitting: Internal Medicine

## 2008-05-27 DIAGNOSIS — M25519 Pain in unspecified shoulder: Secondary | ICD-10-CM

## 2008-05-27 LAB — CONVERTED CEMR LAB
ALT: 13 units/L (ref 0–35)
AST: 15 units/L (ref 0–37)
Albumin: 4.3 g/dL (ref 3.5–5.2)
Alkaline Phosphatase: 57 units/L (ref 39–117)
BUN: 9 mg/dL (ref 6–23)
Basophils Absolute: 0 10*3/uL (ref 0.0–0.1)
Basophils Relative: 0 % (ref 0–1)
CO2: 21 meq/L (ref 19–32)
Calcium: 9.2 mg/dL (ref 8.4–10.5)
Chloride: 105 meq/L (ref 96–112)
Cholesterol: 199 mg/dL (ref 0–200)
Creatinine, Ser: 0.72 mg/dL (ref 0.40–1.20)
Eosinophils Absolute: 0.3 10*3/uL (ref 0.0–0.7)
Eosinophils Relative: 3 % (ref 0–5)
Glucose, Bld: 75 mg/dL (ref 70–99)
HCT: 40.4 % (ref 36.0–46.0)
HDL: 58 mg/dL (ref >39)
Hemoglobin: 13.6 g/dL (ref 12.0–15.0)
LDL Cholesterol: 126 mg/dL — ABNORMAL HIGH (ref 0–99)
Lymphocytes Relative: 30 % (ref 12–46)
Lymphs Abs: 3 10*3/uL (ref 0.7–4.0)
MCHC: 33.7 g/dL (ref 30.0–36.0)
MCV: 92.2 fL (ref 78.0–100.0)
Monocytes Absolute: 0.8 10*3/uL (ref 0.1–1.0)
Monocytes Relative: 8 % (ref 3–12)
Neutro Abs: 5.8 10*3/uL (ref 1.7–7.7)
Neutrophils Relative %: 59 % (ref 43–77)
Platelets: 225 10*3/uL (ref 150–400)
Potassium: 3.5 meq/L (ref 3.5–5.3)
RBC: 4.38 M/uL (ref 3.87–5.11)
RDW: 13.2 % (ref 11.5–15.5)
Sodium: 142 meq/L (ref 135–145)
TSH: 0.776 microintl units/mL (ref 0.350–4.50)
Total Bilirubin: 0.7 mg/dL (ref 0.3–1.2)
Total CHOL/HDL Ratio: 3.4
Total Protein: 7.5 g/dL (ref 6.0–8.3)
Triglycerides: 75 mg/dL (ref <150)
VLDL: 15 mg/dL (ref 0–40)
WBC: 9.8 10*3/uL (ref 4.0–10.5)

## 2008-05-27 IMAGING — CR DG SHOULDER 2+V*R*
3 series · 3 of 3 positions shown · non-contrast
Comparison: None

CLINICAL DATA: Pain, possible bone spur

RIGHT SHOULDER - 2+ VIEW

[w shoulder ap internal righ]
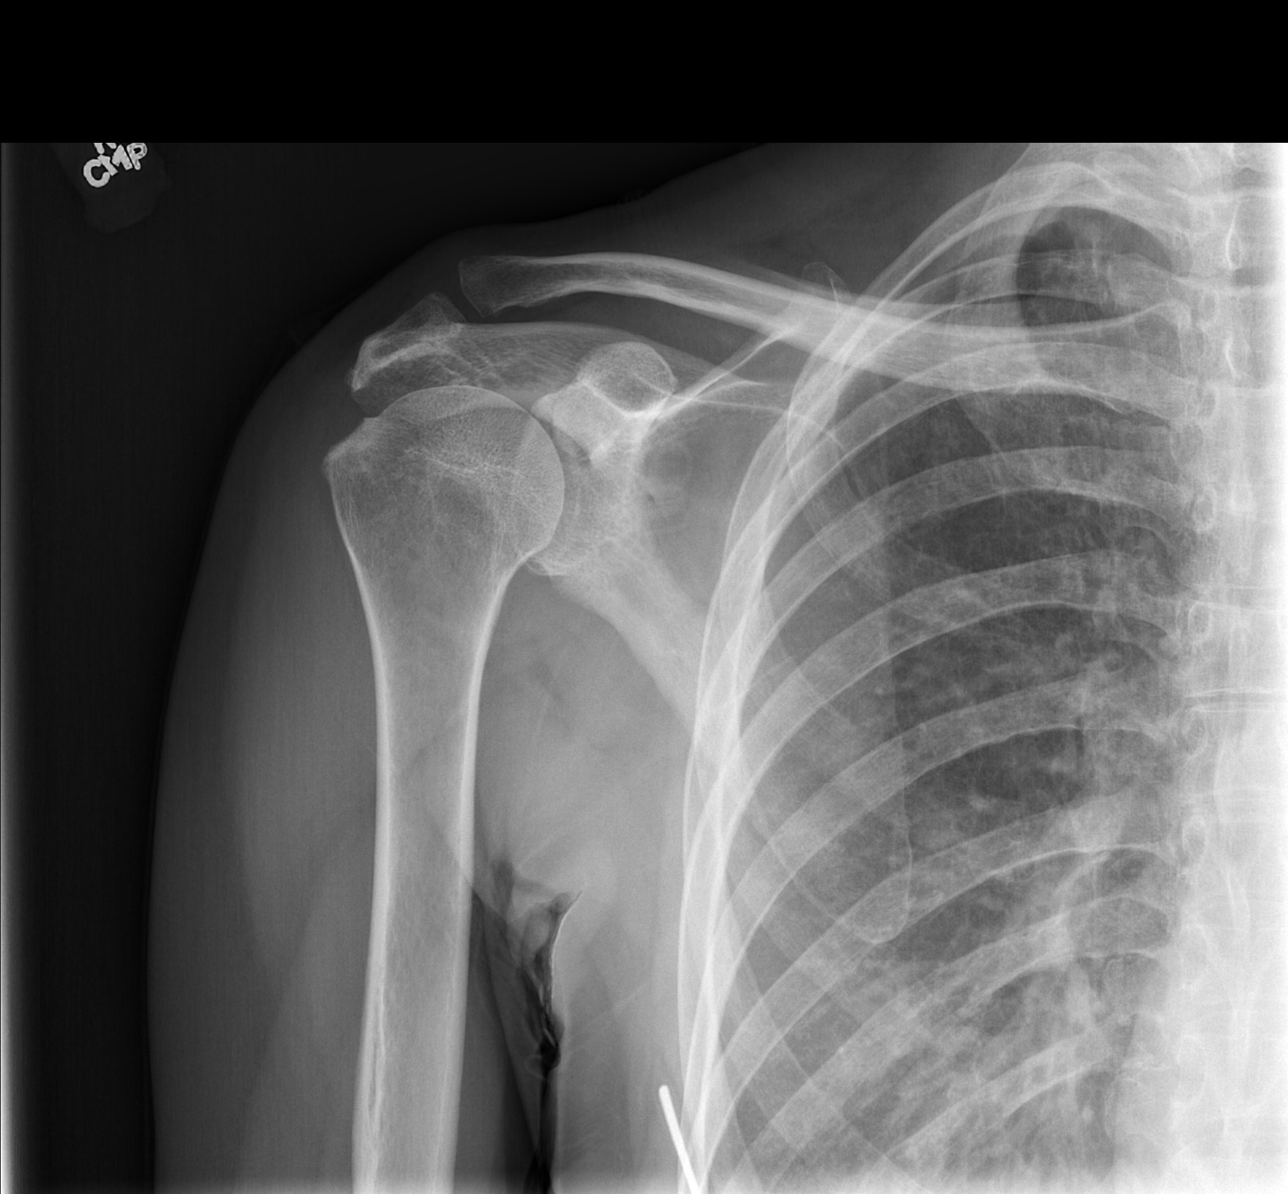

[w shoulder ap external righ]
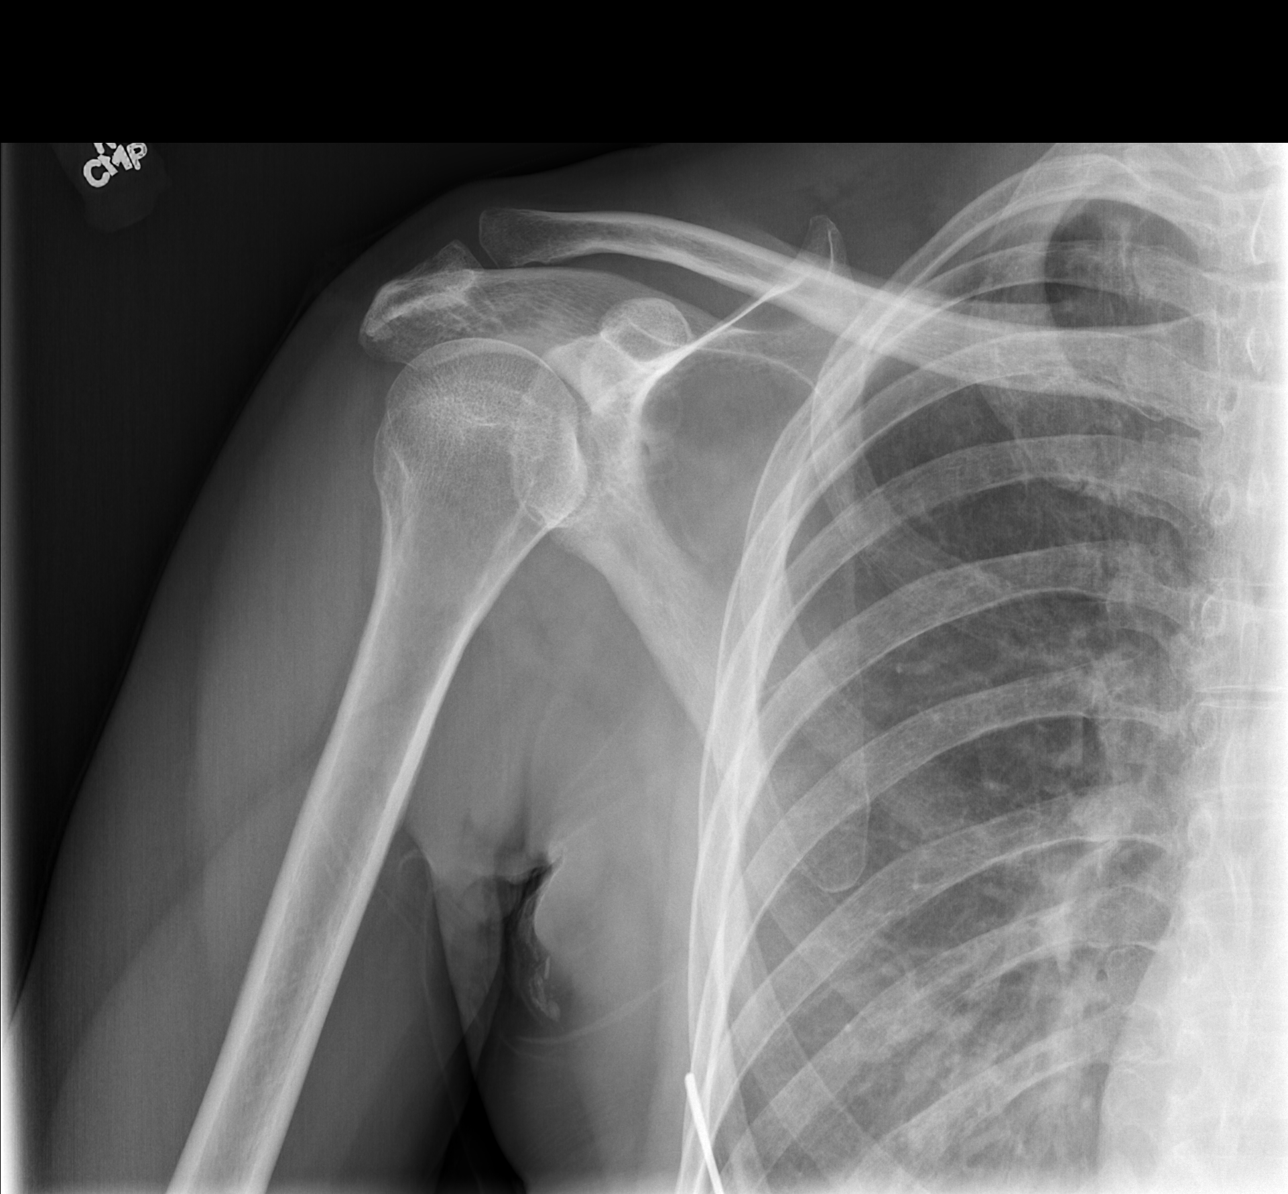

[w shoulder y view right]
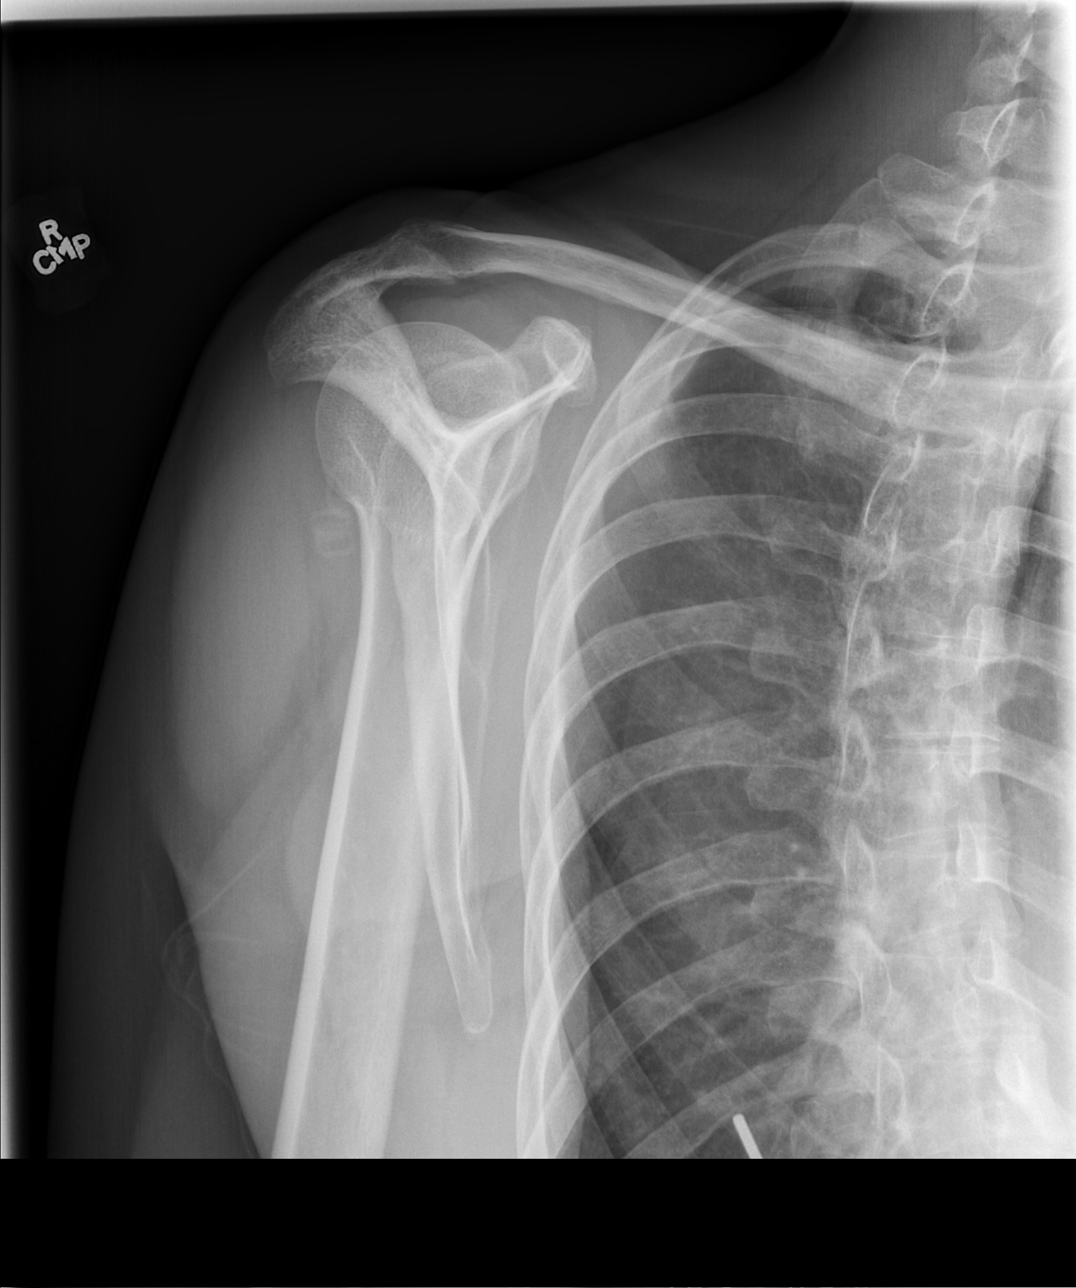

[3 of 3 positions shown; findings below may reference images not displayed]

FINDINGS: Three views of the right shoulder shows no acute fracture
or subluxation.  No radiopaque foreign bodies noted.
IMPRESSION: No right shoulder acute fracture or subluxation.

## 2008-05-30 ENCOUNTER — Telehealth (INDEPENDENT_AMBULATORY_CARE_PROVIDER_SITE_OTHER): Payer: Self-pay | Admitting: Internal Medicine

## 2008-06-05 ENCOUNTER — Encounter (INDEPENDENT_AMBULATORY_CARE_PROVIDER_SITE_OTHER): Payer: Self-pay | Admitting: Internal Medicine

## 2008-06-07 ENCOUNTER — Telehealth: Payer: Self-pay | Admitting: *Deleted

## 2008-06-07 ENCOUNTER — Telehealth (INDEPENDENT_AMBULATORY_CARE_PROVIDER_SITE_OTHER): Payer: Self-pay | Admitting: Internal Medicine

## 2008-08-06 ENCOUNTER — Ambulatory Visit: Payer: Self-pay | Admitting: Internal Medicine

## 2008-12-12 ENCOUNTER — Ambulatory Visit: Payer: Self-pay | Admitting: Internal Medicine

## 2008-12-12 ENCOUNTER — Encounter (INDEPENDENT_AMBULATORY_CARE_PROVIDER_SITE_OTHER): Payer: Self-pay | Admitting: Internal Medicine

## 2008-12-12 DIAGNOSIS — E785 Hyperlipidemia, unspecified: Secondary | ICD-10-CM

## 2008-12-12 LAB — CONVERTED CEMR LAB
Chlamydia, DNA Probe: NEGATIVE
GC Probe Amp, Genital: NEGATIVE

## 2008-12-13 ENCOUNTER — Encounter (INDEPENDENT_AMBULATORY_CARE_PROVIDER_SITE_OTHER): Payer: Self-pay | Admitting: Internal Medicine

## 2008-12-13 ENCOUNTER — Telehealth: Payer: Self-pay | Admitting: *Deleted

## 2008-12-17 LAB — CONVERTED CEMR LAB
Candida species: NEGATIVE
Gardnerella vaginalis: POSITIVE — AB
Trichomonal Vaginitis: NEGATIVE

## 2008-12-18 ENCOUNTER — Encounter (INDEPENDENT_AMBULATORY_CARE_PROVIDER_SITE_OTHER): Payer: Self-pay | Admitting: Internal Medicine

## 2008-12-18 ENCOUNTER — Ambulatory Visit: Payer: Self-pay | Admitting: Internal Medicine

## 2008-12-18 LAB — CONVERTED CEMR LAB
ALT: 11 units/L (ref 0–35)
AST: 15 units/L (ref 0–37)
Albumin: 3.8 g/dL (ref 3.5–5.2)
Alkaline Phosphatase: 63 units/L (ref 39–117)
BUN: 11 mg/dL (ref 6–23)
CO2: 22 meq/L (ref 19–32)
Calcium: 9.1 mg/dL (ref 8.4–10.5)
Chloride: 109 meq/L (ref 96–112)
Cholesterol: 153 mg/dL (ref 0–200)
Creatinine, Ser: 0.76 mg/dL (ref 0.40–1.20)
GFR calc Af Amer: >60 mL/min (ref >60)
GFR calc non Af Amer: >60 mL/min (ref >60)
Glucose, Bld: 86 mg/dL (ref 70–99)
HDL: 41 mg/dL (ref >39)
LDL Cholesterol: 99 mg/dL (ref 0–99)
Potassium: 3.7 meq/L (ref 3.5–5.3)
Sodium: 141 meq/L (ref 135–145)
Total Bilirubin: 0.6 mg/dL (ref 0.3–1.2)
Total CHOL/HDL Ratio: 3.7
Total Protein: 6.9 g/dL (ref 6.0–8.3)
Triglycerides: 67 mg/dL (ref <150)
VLDL: 13 mg/dL (ref 0–40)

## 2009-01-28 ENCOUNTER — Telehealth (INDEPENDENT_AMBULATORY_CARE_PROVIDER_SITE_OTHER): Payer: Self-pay | Admitting: Internal Medicine

## 2009-06-25 ENCOUNTER — Ambulatory Visit: Payer: Self-pay | Admitting: Infectious Disease

## 2009-06-26 LAB — CONVERTED CEMR LAB
Basophils Absolute: 0.1 10*3/uL (ref 0.0–0.1)
Basophils Relative: 1 % (ref 0–1)
Eosinophils Absolute: 0 10*3/uL (ref 0.0–0.7)
Eosinophils Relative: 0 % (ref 0–5)
HCT: 40.8 % (ref 36.0–46.0)
Hemoglobin: 14.1 g/dL (ref 12.0–15.0)
Lymphocytes Relative: 25 % (ref 12–46)
Lymphs Abs: 2 10*3/uL (ref 0.7–4.0)
MCHC: 34.6 g/dL (ref 30.0–36.0)
MCV: 95.7 fL (ref >=78.0)
Monocytes Absolute: 0.7 10*3/uL (ref 0.1–1.0)
Monocytes Relative: 10 % (ref 3–12)
Neutro Abs: 4.9 10*3/uL (ref 1.7–7.7)
Neutrophils Relative %: 64 % (ref 43–77)
Platelets: 210 10*3/uL (ref 150–400)
RBC: 4.27 M/uL (ref 3.87–5.11)
RDW: 14 % (ref 11.5–15.5)
TSH: 0.803 microintl units/mL (ref 0.350–4.5)
WBC: 7.7 10*3/uL (ref 4.0–10.5)

## 2009-07-01 ENCOUNTER — Telehealth: Payer: Self-pay | Admitting: *Deleted

## 2009-07-22 ENCOUNTER — Telehealth (INDEPENDENT_AMBULATORY_CARE_PROVIDER_SITE_OTHER): Payer: Self-pay | Admitting: Internal Medicine

## 2009-07-31 ENCOUNTER — Telehealth (INDEPENDENT_AMBULATORY_CARE_PROVIDER_SITE_OTHER): Payer: Self-pay | Admitting: Internal Medicine

## 2009-11-04 ENCOUNTER — Telehealth: Payer: Self-pay | Admitting: *Deleted

## 2009-11-20 ENCOUNTER — Emergency Department (HOSPITAL_COMMUNITY): Admission: EM | Admit: 2009-11-20 | Discharge: 2009-11-20 | Payer: Self-pay | Admitting: Family Medicine

## 2010-03-04 ENCOUNTER — Emergency Department (HOSPITAL_COMMUNITY): Admission: EM | Admit: 2010-03-04 | Discharge: 2010-03-04 | Payer: Self-pay | Admitting: Emergency Medicine

## 2010-03-17 ENCOUNTER — Ambulatory Visit: Payer: Self-pay | Admitting: Internal Medicine

## 2010-03-17 ENCOUNTER — Telehealth: Payer: Self-pay | Admitting: Internal Medicine

## 2010-04-01 ENCOUNTER — Telehealth (INDEPENDENT_AMBULATORY_CARE_PROVIDER_SITE_OTHER): Payer: Self-pay | Admitting: *Deleted

## 2010-04-01 ENCOUNTER — Telehealth: Payer: Self-pay | Admitting: Internal Medicine

## 2010-05-29 ENCOUNTER — Emergency Department (HOSPITAL_COMMUNITY): Admission: EM | Admit: 2010-05-29 | Discharge: 2010-05-29 | Payer: Self-pay | Admitting: Emergency Medicine

## 2010-07-03 IMAGING — CR DG CHEST 2V
2 series · 2 of 2 positions shown · non-contrast
Comparison: 09/14/2006.

CLINICAL DATA: Chest pain.

CHEST - 2 VIEW

[w chest pa]
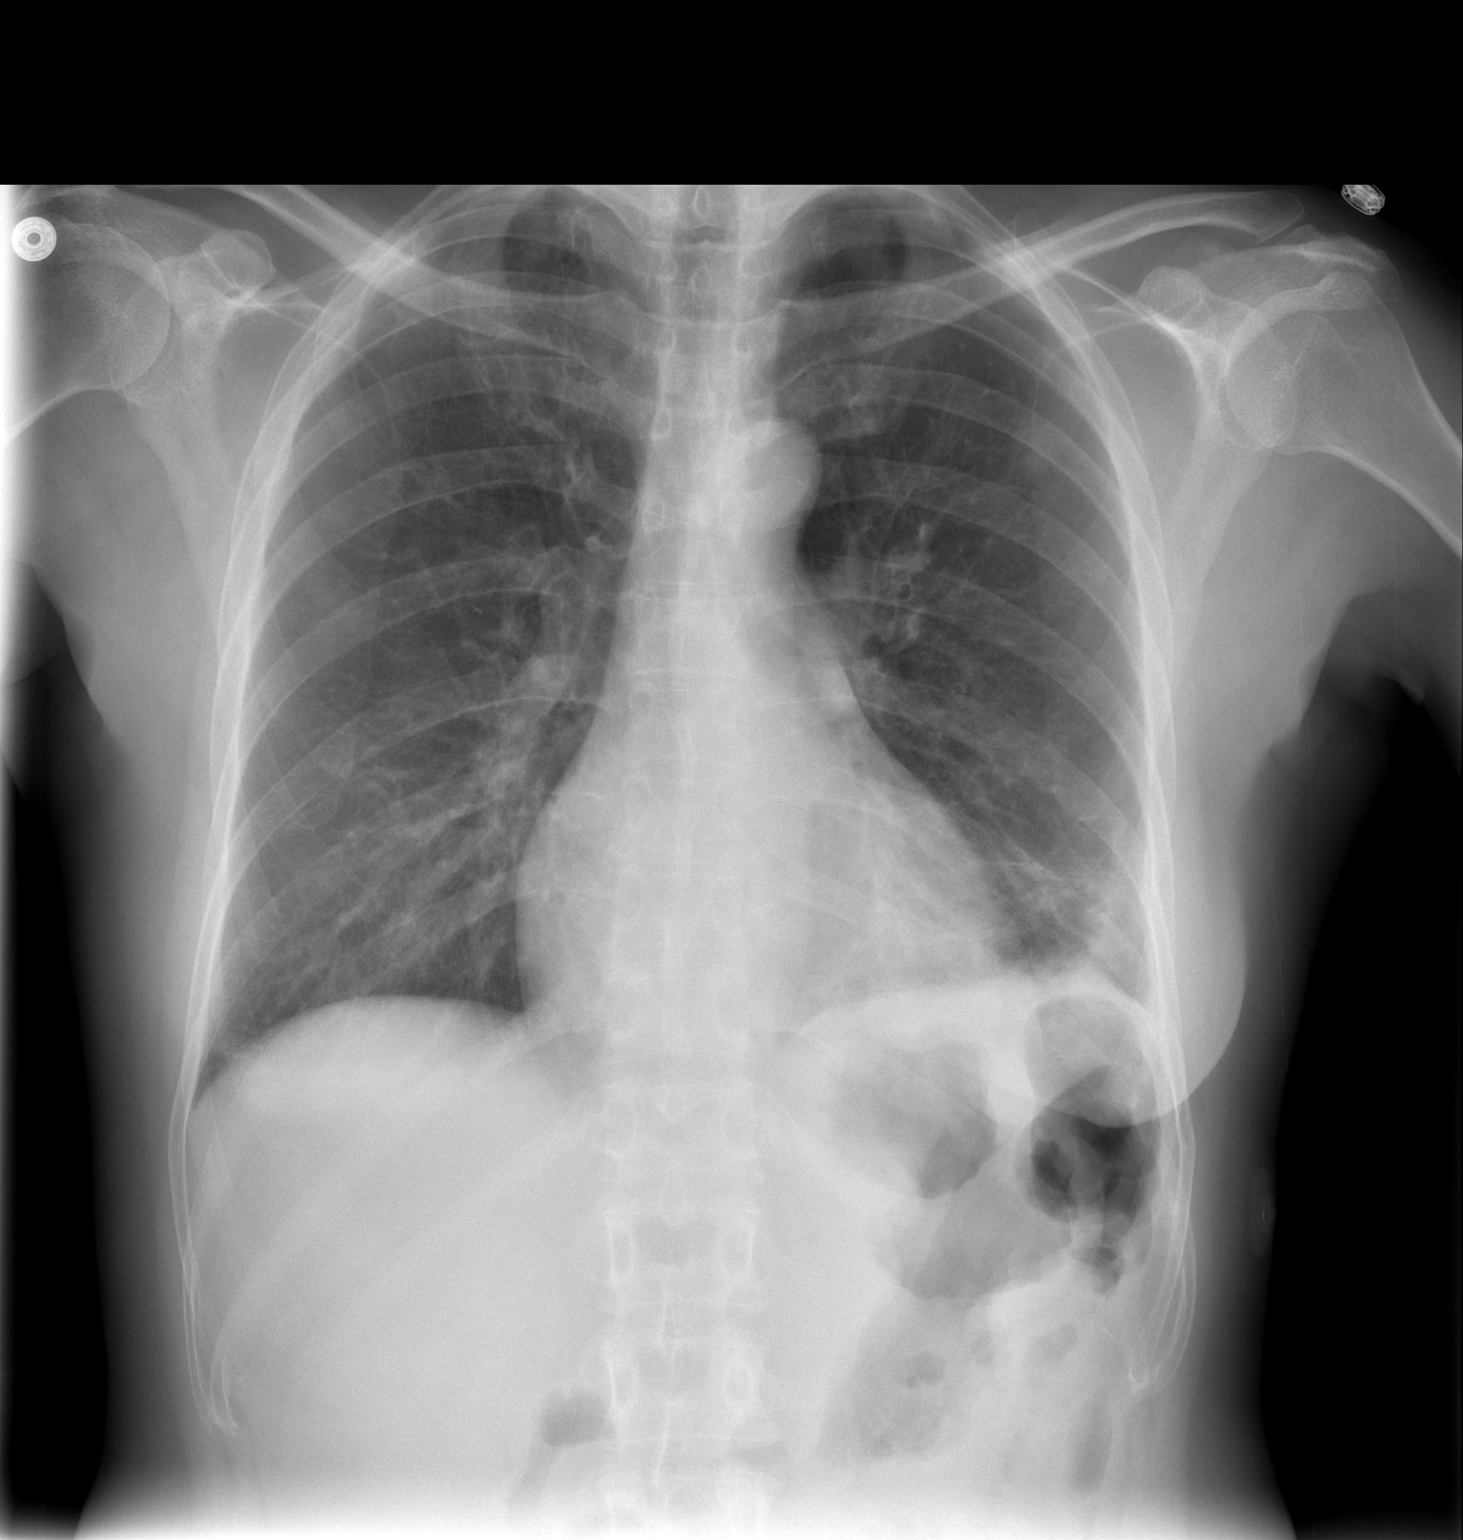

[w chest lat]
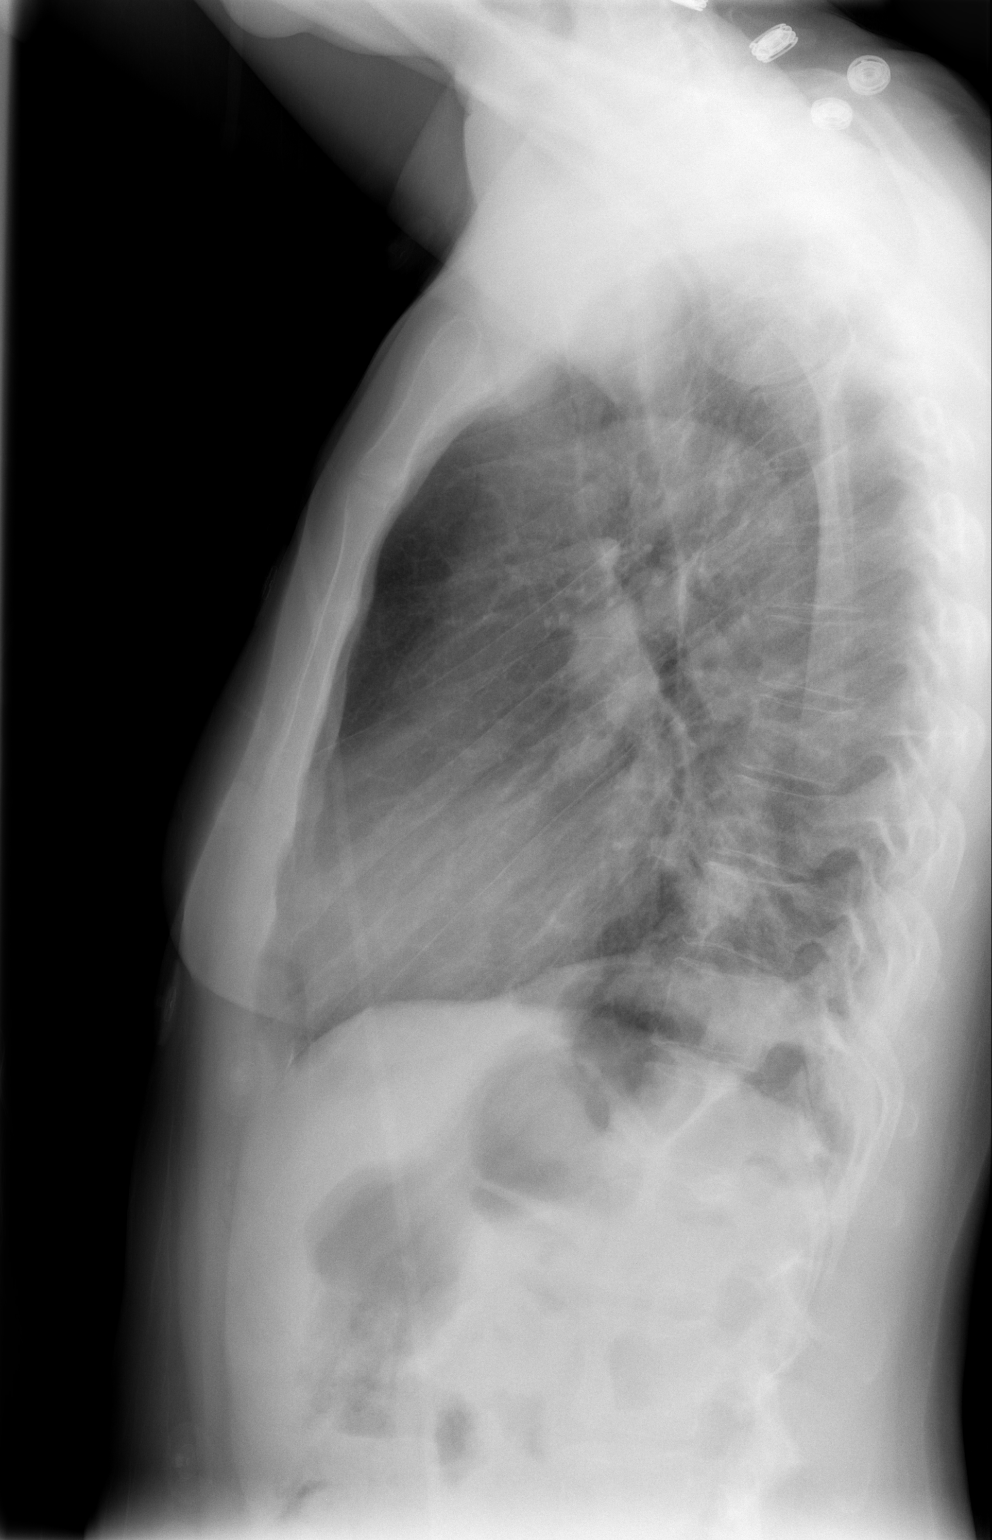

[2 of 2 positions shown; findings below may reference images not displayed]

FINDINGS: There is left lower lobe airspace disease.  Increased
density is present over the lower thoracic spine on the lateral
view.  Volume loss accompanies airspace disease.  No effusion.
Cardiopericardial silhouette appears within normal limits.

Radiographic follow-up is recommended to ensure clearing and
exclude an underlying lesion.  Radiographic clearing is usually
observed at 4-6 weeks.
IMPRESSION: Left lower lobe airspace disease and volume loss compatible with
pneumonia.  This is superimposed on left basilar scarring which is
chronic.

## 2010-07-08 ENCOUNTER — Emergency Department (HOSPITAL_COMMUNITY): Admission: EM | Admit: 2010-07-08 | Discharge: 2010-07-08 | Payer: Self-pay | Admitting: Emergency Medicine
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
## 2010-07-14 ENCOUNTER — Ambulatory Visit: Payer: Self-pay | Admitting: Internal Medicine

## 2010-07-14 DIAGNOSIS — J189 Pneumonia, unspecified organism: Secondary | ICD-10-CM

## 2010-07-30 ENCOUNTER — Emergency Department (HOSPITAL_COMMUNITY): Admission: EM | Admit: 2010-07-30 | Discharge: 2010-07-03 | Payer: Self-pay | Admitting: Emergency Medicine

## 2010-09-12 ENCOUNTER — Encounter: Payer: Self-pay | Admitting: *Deleted

## 2010-09-13 ENCOUNTER — Encounter: Payer: Self-pay | Admitting: Internal Medicine

## 2010-09-21 ENCOUNTER — Ambulatory Visit: Admit: 2010-09-21 | Payer: Self-pay

## 2010-09-24 NOTE — Assessment & Plan Note (Signed)
Summary: f/u ed, multiple complaints/pcp-illath/hla   Vital Signs:  Patient profile:   50 year old female Height:      63.5 inches (161.29 cm) Weight:      128.5 pounds (58.41 kg) BMI:     22.49 Temp:     98.0 degrees F oral Pulse rate:   76 / minute BP sitting:   114 / 68  (right arm) Cuff size:   regular  Vitals Entered By: Chinita Pester RN (July 14, 2010 9:45 AM) CC: ED fu (last Wednesday) -States she had walking pneumonia. Med refills. Is Patient Diabetic? No Pain Assessment Patient in pain? yes     Location: left side Intensity: 5 Type: sharp Onset of pain  when she coughs. Nutritional Status BMI of 19 -24 = normal  Have you ever been in a relationship where you felt threatened, hurt or afraid?No   Does patient need assistance? Functional Status Self care Ambulation Normal   Primary Care Provider:  Elby Showers MD  CC:  ED fu (last Wednesday) -States she had walking pneumonia. Med refills..  History of Present Illness: This is a 50 year old female with a history of depression, anorexia and hyperlipidemia who presents for ER follow up on 07/08/10.  Apparently pt was see in ED on 07/03/10 and diagnosed with a left lower lobe pneumonia.  Pt was started on azithromycin and rocephin at that time but returned on 11/16 with a complaint of coughing up blood.  Pt also complained of some blood in her stool at that time. Pt continues to have a cough and constantly feels like there is something stuck in her throat.  The cough is not particularly worse at any time of day and is non productive.  Pt currently denies any hemoptysis but does have some pain in her side when she coughs.  Pt complains of some mild nasal congestion and continued horsiness.    Ms. Devincent states that about 2 days after starting antibiotics she developed a yellow vaginal discharge assoicated with mild pruritis which has continued.  Pt does not feel that she requires STD testing at this time as she is in  a monogamous relationship with her husband.  Pt did explain however that she has been told in the past that because of her hx of hysterectomy, she cannot contract STD's.   Currently pt denies any further episodes of blood in her stool and occult blood testing was negative in the ED on the 16th.  Pt does state that she has a history of hemorrhoids. Pt denies any CP, SOB, fever, chills, diarrhea or constipation.      Depression History:      The patient is having a depressed mood most of the day but denies diminished interest in her usual daily activities.        The patient denies that she feels like life is not worth living, denies that she wishes that she were dead, and denies that she has thought about ending her life.         Preventive Screening-Counseling & Management  Alcohol-Tobacco     Alcohol drinks/day: occasional     Alcohol type: beer     Smoking Status: current     Smoking Cessation Counseling: yes     Packs/Day: 4 cigs/day     Year Started: age 29  Caffeine-Diet-Exercise     Caffeine use/day: 0     Does Patient Exercise: no  Allergies: No Known Drug Allergies  Past History:  Past Medical History: Last updated: 04/15/2008 hysterectomy anxiety depression insomnia weight loss due to anxiety history of PE in 2006 history of alcohol abuse  Family History: Reviewed history from 04/15/2008 and no changes required. Family History of CAD Female 1st degree relative <60 (sister)  Social History: Reviewed history from 05/27/2008 and no changes required. Occupation: dissability from PE since 2005 Single Current Smoker 1/2 pack per day Alcohol 2-3 beers per week Drug use-no Regular exercise-no Packs/Day:  4 cigs/day  Review of Systems       Negative as per HPI.   Physical Exam  General:  alert and well-hydrated.  alert and well-hydrated.   Head:  normocephalic and atraumatic.  normocephalic and atraumatic.   Eyes:  vision grossly intact, pupils equal,  pupils round, and pupils reactive to light.  vision grossly intact, pupils equal, pupils round, and pupils reactive to light.   Nose:  Mild boggyness of the turbinates bilaterally. No discharge.  Mouth:  poor dentition.  poor dentition.   Neck:  supple and full ROM.  supple and full ROM.   Chest Wall:  no tenderness.  no tenderness.   Lungs:  normal respiratory effort, normal breath sounds, no crackles, and no wheezes.  normal respiratory effort, normal breath sounds, no crackles, and no wheezes.   Heart:  normal rate, regular rhythm, no murmur, no gallop, and no rub.  normal rate, regular rhythm, no murmur, no gallop, and no rub.   Abdomen:  soft, non-tender, normal bowel sounds, and no distention.  soft, non-tender, normal bowel sounds, and no distention.   Pulses:  2+ dp/pt pulses bilaterally.  Extremities:  No edema.  Neurologic:  cranial nerves II-XII intact.  cranial nerves II-XII intact.     Impression & Recommendations:  Problem # 1:  PNEUMONIA (ICD-486)  Pt was recently treated for CAP with azithromycin and has now completed her course of antibiotics.  Pt continues to complain of residual cough and nasal congestion.  At this point the patient's lungs are clear and I feel that the pneumonia has likely resolved as her cough is no longer productive of any sputum.  For now we will treat symptomatically with tessalon pearls and saline nasal spray.  Given the pts age, and recent pneumonia I recommended that the patient be tested for HIV, however, she refuses to have the test done at this time.  Apparently, the patient has had conversations regarding HIV testing in the past with Dr. Clent Ridges but she refused at that time as well. I explained to the patient in detail that HIV was a treatable disease and the greatest risk is in not knowing her HIV status.  The patient continued to refuse testing but stated that she would go home and talk to her family about it and would come back for testing after  Christmas.  I told the patient to avoid any high risk behaviors until testing and she voiced understanding.   The pt was told that she can call the clinic at any time if she changes her mind and wishes to be tested.   The patient needs to have a follow up CT scan 3 months from now to rule out underlying mass lesion.  Her updated medication list for this problem includes:    Metronidazole 500 Mg Tabs (Metronidazole) .Marland Kitchen... Take 1 tablet by mouth two times a day for 7 days. do not drink alcohol on this drug.  Problem # 2:  VAGINAL DISCHARGE (ICD-623.5) Given patients hx, symptoms are most constant  with a vaginal yeast infection.  Given pts hx of bacterial vaginosis, however, I will treat for both fungal and bacterial causes at this point.   Pt was instructed to return to the clinic if she does get relief of her symptoms.    Problem # 3:  DEPRESSION (ICD-311)  Stable, pt feels that her alprazolam his greatly improving her anxiety and states that she does not currently feel depressed.  Pt wishes to discontinue remeron at this time as she states that it keeps her up at night.  Pt denies any HI or SI.   medications were removed from the medication list:    Remeron 15 Mg Tabs (Mirtazapine) .Marland Kitchen... Take 1 tab by mouth at bedtime Her updated medication list for this problem includes:    Alprazolam 0.25 Mg Tabs (Alprazolam) .Marland Kitchen... Take one tablet at bedime for sleep.  The following medications were removed from the medication list:    Remeron 15 Mg Tabs (Mirtazapine) .Marland Kitchen... Take 1 tab by mouth at bedtime Her updated medication list for this problem includes:    Alprazolam 0.25 Mg Tabs (Alprazolam) .Marland Kitchen... Take one tablet at bedime for sleep.  Problem # 4:  ? of BLOOD IN STOOL (ICD-578.1)  Pt has hx of hemorhoids and denies any blood in her stool or dark stools since her ED visit.  Pts HgB was 13.1 at that time and stool was heme negative.  At this point we will continue to monitor but will check a CBC to  insure that she is not becoming anemic.   Orders: T-CBC No Diff ([id number])  Problem # 5:  PREVENTIVE HEALTH CARE (ICD-V70.0) Given recent pneumonia, pt recieved a flu shot today.  I will also have the patient return for a fasting lipid panel.   Orders: Fasting lipid panel Flu Vaccine 22yrs + MEDICARE PATIENTS (U9811)  Problem # 6:  STD education I did inform the patient that she is still able to get any STD that a typical person is able to get and that a past hysterectomy does not change this.  Pt understood and stated that she did not need to be tested for any STDs at this time given that she is in a monogamous relationship.  Complete Medication List: 1)  Alprazolam 0.25 Mg Tabs (Alprazolam) .... Take one tablet at bedime for sleep. 2)  Pravachol 20 Mg Tabs (Pravastatin sodium) .... Take one tablet daily to decrease cholesterol. 3)  Ambien 10 Mg Tabs (Zolpidem tartrate) .... Take one tablet at bedtime. 4)  Deep Sea Nasal Spray 0.65 % Soln (Saline) .... Use 3-4 sprays per nostril per day.  use every day as needed. 5)  Megace Es 625 Mg/81ml Susp (Megestrol acetate) .... Take 5ml once daily. 6)  Allegra 180 Mg Tabs (Fexofenadine hcl) .... Take 1 tablet by mouth daily 7)  Benzonatate 100 Mg Caps (Benzonatate) .... Take 1 tablet by mouth three times a day as needed for cough 8)  Fluconazole 150 Mg Tabs (Fluconazole) .... Take 1 tablet by mouth and repeat in 3 days if you continue to have symptoms. 9)  Metronidazole 500 Mg Tabs (Metronidazole) .... Take 1 tablet by mouth two times a day for 7 days. do not drink alcohol on this drug.  Other Orders: T-Lipid Profile [id number]) Influenza Vaccine NON MCR (13086)  Patient Instructions: 1)  Please schedule an appointment with Dr. Loistine Chance in early January for possible HIV testing and for your general health managment.  Please avoid sexual contact or other high risk  behaviors untill you are tested.  Please come back for labs in the next week, no  not eat before.  I am starting you a new medication for your vaginal discharge.  Please do not drink alcohol while taking metronidazole.  Prescriptions: AMBIEN 10 MG TABS (ZOLPIDEM TARTRATE) Take one tablet at bedtime.  #32 x 0   Entered and Authorized by:   Sinda Du MD   Signed by:   Sinda Du MD on 07/14/2010   Method used:   Print then Give to Patient   RxID:   [id number] ALPRAZOLAM 0.25 MG TABS (ALPRAZOLAM) Take one tablet at bedime for sleep.  #8 x 0   Entered and Authorized by:   Sinda Du MD   Signed by:   Sinda Du MD on 07/14/2010   Method used:   Print then Give to Patient   RxID:   [id number] PRAVACHOL 20 MG TABS (PRAVASTATIN SODIUM) Take one tablet daily to decrease cholesterol.  #30 x 11   Entered and Authorized by:   Sinda Du MD   Signed by:   Sinda Du MD on 07/14/2010   Method used:   Print then Give to Patient   RxID:   [id number] MEGACE ES 625 MG/5ML SUSP (MEGESTROL ACETATE) Take 5mL once daily.  #150 mL x 0   Entered and Authorized by:   Sinda Du MD   Signed by:   Sinda Du MD on 07/14/2010   Method used:   Print then Give to Patient   RxID:   [id number] METRONIDAZOLE 500 MG TABS (METRONIDAZOLE) Take 1 tablet by mouth two times a day for 7 days. Do not drink alcohol on this drug.  #14 x 0   Entered and Authorized by:   Sinda Du MD   Signed by:   Sinda Du MD on 07/14/2010   Method used:   Print then Give to Patient   RxID:   [id number] FLUCONAZOLE 150 MG TABS (FLUCONAZOLE) Take 1 tablet by mouth and repeat in 3 days if you continue to have symptoms.  #2 x 0   Entered and Authorized by:   Sinda Du MD   Signed by:   Sinda Du MD on 07/14/2010   Method used:   Print then Give to Patient   RxID:   [id number] BENZONATATE 100 MG CAPS (BENZONATATE) Take 1 tablet by mouth three times a day as needed for cough  #42 x 0   Entered and Authorized by:   Sinda Du MD   Signed by:    Sinda Du MD on 07/14/2010   Method used:   Print then Give to Patient   RxID:   [id number]    Orders Added: 1)  Est. Patient Level III [70623] 2)  T-CBC No Diff [85027-10000] 3)  T-Lipid Profile [[id number]] 4)  Flu Vaccine 31yrs + MEDICARE PATIENTS [Q2039] 5)  Influenza Vaccine NON MCR [00028]   Immunizations Administered:  Influenza Vaccine # 1:    Vaccine Type: Fluvax Non-MCR    Site: right deltoid    Mfr: GlaxoSmithKline    Dose: 0.5 ml    Route: IM    Given by: Chinita Pester RN    Exp. Date: 02/20/2011    Lot #: [id number]    VIS given: 03/17/10 version given July 14, 2010.  Flu Vaccine Consent Questions:    Do you have a history of severe allergic reactions to this vaccine? no    Any prior history of allergic reactions to egg and/or gelatin? no  Do you have a sensitivity to the preservative Thimersol? no    Do you have a past history of Guillan-Barre Syndrome? no    Do you currently have an acute febrile illness? no    Have you ever had a severe reaction to latex? no    Vaccine information given and explained to patient? yes    Are you currently pregnant? no   Immunizations Administered:  Influenza Vaccine # 1:    Vaccine Type: Fluvax Non-MCR    Site: right deltoid    Mfr: GlaxoSmithKline    Dose: 0.5 ml    Route: IM    Given by: Chinita Pester RN    Exp. Date: 02/20/2011    Lot #: [id number]    VIS given: 03/17/10 version given July 14, 2010.  Prevention & Chronic Care Immunizations   Influenza vaccine: Fluvax Non-MCR  (07/14/2010)    Tetanus booster: Not documented    Pneumococcal vaccine: Not documented  Other Screening   Pap smear: Not documented    Mammogram: Not documented   Smoking status: current  (07/14/2010)   Smoking cessation counseling: yes  (07/14/2010)  Lipids   Total Cholesterol: 153  (12/18/2008)   LDL: 99  (12/18/2008)   LDL Direct: Not documented   HDL: 41  (12/18/2008)   Triglycerides: 67   (12/18/2008)    SGOT (AST): 15  (12/18/2008)   SGPT (ALT): 11  (12/18/2008)   Alkaline phosphatase: 63  (12/18/2008)   Total bilirubin: 0.6  (12/18/2008)  Self-Management Support :   Personal Goals (by the next clinic visit) :      Personal LDL goal: 100  (07/14/2010)    Patient will work on the following items until the next clinic visit to reach self-care goals:     Medications and monitoring: take my medicines every day, bring all of my medications to every visit  (07/14/2010)     Eating: eat more vegetables, use fresh or frozen vegetables, eat baked foods instead of fried foods  (07/14/2010)    Lipid self-management support: Written self-care plan  (07/14/2010)   Lipid self-care plan printed.  Process Orders Check Orders Results:     [facility]: ABN not required for this insurance Tests Sent for requisitioning (July 14, 2010 11:23 AM):     07/14/2010: [facility] -- T-CBC No Diff [[id number]] (signed)     07/14/2010: [facility] -- T-Lipid Profile [phone] (signed)

## 2010-09-24 NOTE — Progress Notes (Signed)
Summary: refill/gg  Phone Note Refill Request  on April 01, 2010 3:20 PM  Refills Requested: Medication #1:  DEEP SEA NASAL SPRAY 0.65 % SOLN Use 3-4 sprays per nostril per day.  Use every day as needed.  Method Requested: Electronic Initial call taken by: Merrie Roof RN,  April 01, 2010 3:20 PM

## 2010-09-24 NOTE — Assessment & Plan Note (Signed)
Summary: vag itching x 3 days, r/t to abx?/pcp-illath/hla   Vital Signs:  Patient profile:   50 year old female Height:      63.5 inches (161.29 cm) Weight:      121.9 pounds (55.41 kg) BMI:     21.33 Temp:     98.3 degrees F Pulse rate:   77 / minute BP sitting:   118 / 73  (left arm) Cuff size:   regular  Vitals Entered By: Dorie Rank RN (March 17, 2010 4:27 PM) CC: vaginal area itching - occasional white discharge at present ( tends to have discharge on and off chronically) - started Clindamycin for "boil" in mouth- started itching around vagina and had diarrhea so stopped Clindamycin - restarted yesterday but did not take any today, Depression Is Patient Diabetic? No Pain Assessment Patient in pain? no      Nutritional Status BMI of 19 -24 = normal  Have you ever been in a relationship where you felt threatened, hurt or afraid?No   Does patient need assistance? Functional Status Self care Ambulation Normal Comments wants refills on all meds   Primary Care Provider:  Elby Showers MD  CC:  vaginal area itching - occasional white discharge at present ( tends to have discharge on and off chronically) - started Clindamycin for "boil" in mouth- started itching around vagina and had diarrhea so stopped Clindamycin - restarted yesterday but did not take any today and Depression.  History of Present Illness: 50 y/o female with pmh as described in the EMR; who comes tot he clinic complaining of vaginal itching and white discharge after been using clindamycin for a mouth infection. Patient is now done with antibiotics and her mouth site is healed; she will schedule an appointment with her dentist and is contemplating about quitting smoking.  Patient is also complaining of allergic rhinitis (PND,sore throat, hoarseness and sometimes due tomuch irritation difficulty swallowing).  Patient is also having decreased, appetite, depressed mood and low energy. No suicidal ideation and/  or   Patient will like refill on all her meds.  Depression History:      The patient is having a depressed mood most of the day and has a diminished interest in her usual daily activities.  Positive alarm features for depression include insomnia, fatigue (loss of energy), feelings of worthlessness (guilt), and impaired concentration (indecisiveness).        The patient denies that she feels like life is not worth living, denies that she wishes that she were dead, and denies that she has thought about ending her life.        Comments:  ran out of meds "good month" - taking friend's meds - cry on and off - keeps dtr or grand dtr "to keep me moving".   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) 4)  Vaginal Discharge  (ICD-623.5) 5)  Family History of Cad Female 1st Degree Relative <60  (ICD-V16.49) 6)  Anorexia  (ICD-783.0) 7)  Depression  (  ICD-311) 8)  Insomnia  (ICD-780.52) 9)  Weight Loss  (ICD-783.21)  Current Problems (verified): 1)  Hyperlipidemia  (ICD-272.4) 2)  Shoulder Pain, Right  (ICD-719.41) 3)  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 Take One Tablet Daily. 2)  Alprazolam 0.25 Mg Tabs (Alprazolam) .... Take One Tablet At [facility]. 3)  Ibuprofen 800 Mg Tabs  (Ibuprofen) .... Take One Tablet Three Times A Day For 2 Weeks. 4)  Pravachol 20 Mg Tabs (Pravastatin Sodium) .... Take One Tablet Daily To Decrease Cholesterol. 5)  Ambien 10 Mg Tabs (Zolpidem Tartrate) .... Take One Tablet At Bedtime. 6)  Afrin Saline Nasal Mist 0.65 % Soln (Saline) .... Use 2-3 Sprays Per Nostril Per Day.  Only Use For 3 Days in A Row. 7)  Deep Sea Nasal Spray 0.65 % Soln (Saline) .... Use 3-4 Sprays Per Nostril Per Day.  Use Every Day As Needed. 8)  Metronidazole 500 Mg Tabs (Metronidazole) .... Take One Tablet Two Times A Day For 7 Days. 9)  Megestrol Acetate 800 Mg/62ml Susp (Megestrol Acetate) .... Take 15ml Once A Day.  Current Medications (verified): 1)  Alprazolam 0.25 Mg Tabs (Alprazolam) .... Take One Tablet At [facility]. 2)  Pravachol 20 Mg Tabs (Pravastatin Sodium) .... Take One Tablet Daily To Decrease Cholesterol. 3)  Ambien 10 Mg Tabs (Zolpidem Tartrate) .... Take One Tablet At Bedtime. 4)  Deep Sea Nasal Spray 0.65 % Soln (Saline) .... Use 3-4 Sprays Per Nostril Per Day.  Use Every Day As Needed. 5)  Megestrol Acetate 800 Mg/67ml Susp (Megestrol Acetate) .... Take 15ml Once A Day.  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  Physical  Exam  General:  alert, well-developed, and well-nourished.   Nose:  no external deformity and no nasal discharge. Patient sounds congested, reports PND and her throat is mildly erythematous.   Lungs:  normal respiratory effort, no intercostal retractions, no accessory muscle use, and normal breath sounds.   Heart:  normal rate, regular rhythm, and no murmur.   Abdomen:  soft, non-tender, normal bowel sounds, and no distention.   Extremities:  No clubbing, cyanosis, edema, or deformity noted with normal full range of motion of all joints.   Neurologic:  alert & oriented X3, cranial nerves II-XII intact, strength normal in all extremities, and gait normal.     Impression & Recommendations:  Problem # 1:  VAGINAL DISCHARGE (ICD-623.5) Patient description and recent use of antibiotics make vaginal candidiasis high in the differential; she reports not to be sexually active currently and will  preferred empirically treatment with antifungal drugs (symptoms has been treated in the past with that medication and she knows and remember the symptoms). Will use diflucan for 3 daysand if symptoms failed to improved will perform pelvic exam and cultures.  Problem # 2:  ALLERGIC RHINITIS DUE TO POLLEN (ICD-477.0) Patient symptoms of PND, sore throat and congestion are consistently with allergic rhinitis (condition already described in her records); she stop taking her meds for this problems because they were not helping her and endorses that afrin helps for the first days but then she feels worse. Will change claritin to allegra two times a day; will continue saline nasal rinse as previously directed and will encourage her to stop smoking.  Problem # 3:  HYPERLIPIDEMIA (ICD-272.4) Patient has been out of her medications for the last month; last LDL was 99; since labs are closed today will check lipid profile during next visit and will refill her meds today. Patient LFT's will be check during next visit as  well.  Her updated medication list for this problem includes:    Pravachol 20 Mg Tabs (Pravastatin sodium) .Marland Kitchen... Take one tablet daily to decrease cholesterol.  Labs Reviewed: SGOT: 15 (12/18/2008)   SGPT: 11 (12/18/2008)   HDL:41 (12/18/2008), 58 (05/27/2008)  LDL:99 (12/18/2008), 126 (53/66/4403)  Chol:153 (12/18/2008), 199 (05/27/2008)  Trig:67 (12/18/2008), 75 (05/27/2008)  Problem # 4:  DEPRESSION (ICD-311) Patient with depression and anxiety currently just taking alprazolam. Will start her on remeron, which will help with appetite and insomnia as well. Will follow symptoms response in 2 months.  Her updated medication list for this problem includes:    Alprazolam 0.25 Mg Tabs (Alprazolam) .Marland Kitchen... Take one tablet at bedime for sleep.    Remeron 15 Mg Tabs (Mirtazapine) .Marland Kitchen... Take 1 tab by mouth at bedtime  Complete Medication List: 1)  Alprazolam 0.25 Mg Tabs (Alprazolam) .... Take one tablet at bedime for sleep. 2)  Pravachol 20 Mg Tabs (Pravastatin sodium) .... Take one tablet daily to decrease cholesterol. 3)  Ambien 10 Mg Tabs (Zolpidem tartrate) .... Take one tablet at bedtime. 4)  Deep Sea Nasal Spray 0.65 % Soln (Saline) .... Use 3-4 sprays per nostril per day.  use every day as needed. 5)  Megestrol Acetate 800 Mg/68ml Susp (Megestrol acetate) .... Take 15ml once a day. 6)  Allegra 180 Mg Tabs (Fexofenadine hcl) .... Take 1 tablet by mouth two times a day 7)  Fluconazole 200 Mg Tabs (Fluconazole) .... Take 1 tablet by mouth once a day 8)  Remeron 15 Mg Tabs (Mirtazapine) .... Take 1 tab by mouth at bedtime  Patient Instructions: 1)  Please schedule a follow-up appointment in 2 months. 2)  Take medications a sprescribed. 3)  Tobacco is very bad for your health and your loved ones! You Should stop smoking!. 4)  Stop Smoking Tips: Choose a Quit date. Cut down before the Quit date. decide what you will do as a substitute when you feel the urge to  smoke(gum,toothpick,exercise). 5)  Call 1-800 quit now; please call to received further assistance during your smoking cessation process. 6)  Schedule and appointment to see your dentist. Prescriptions: REMERON 15 MG TABS (MIRTAZAPINE) Take 1 tab by mouth at bedtime  #31 x 3   Entered and Authorized by:   Vassie Loll MD   Signed by:   Vassie Loll MD on 03/17/2010   Method used:   Electronically to        [facility] [phone]* (retail)  [address]       [place], [place]  [address]       Ph: [phone]       Fax: [phone]   RxID:   [id number] FLUCONAZOLE 200 MG TABS (FLUCONAZOLE) Take 1 tablet by mouth once a day  #3 x 0   Entered and Authorized by:   Vassie Loll MD   Signed by:   Vassie Loll MD on 03/17/2010   Method used:   Electronically to        [facility] [phone]* (retail)       [address]       [place], [place]  [address]       Ph: [phone]       Fax: [phone]   RxID:   [id number] ALLEGRA 180 MG TABS (FEXOFENADINE HCL) Take 1 tablet by mouth two times a day  #62 x 3   Entered and Authorized by:   Vassie Loll MD   Signed by:   Vassie Loll MD on 03/17/2010   Method used:   Electronically to        [facility] [phone]* (retail)       [address]       [place], [place]  [address]       Ph: [phone]       Fax: [phone]   RxID:   [id number]    Prevention & Chronic Care Immunizations   Influenza vaccine: Fluvax Non-MCR  (05/27/2008)    Tetanus booster: Not documented    Pneumococcal vaccine: Not documented  Other Screening   Pap smear: Not documented    Mammogram: Not documented   Smoking status: current  (03/17/2010)   Smoking cessation counseling: yes  (03/17/2010)  Lipids   Total Cholesterol: 153  (12/18/2008)   LDL: 99  (12/18/2008)   LDL Direct: Not documented   HDL: 41  (12/18/2008)   Triglycerides: 67  (12/18/2008)    SGOT (AST): 15  (12/18/2008)   SGPT (ALT): 11   (12/18/2008)   Alkaline phosphatase: 63  (12/18/2008)   Total bilirubin: 0.6  (12/18/2008)  Self-Management Support :    Lipid self-management support: Not documented    Appended Document: vag itching x 3 days, r/t to abx?/pcp-illath/hla    Clinical Lists Changes  Medications: Changed medication from ALLEGRA 180 MG TABS (FEXOFENADINE HCL) Take 1 tablet by mouth two times a day to ALLEGRA 180 MG TABS (FEXOFENADINE HCL) Take 1 tablet by mouth daily - Signed Rx of ALLEGRA 180 MG TABS (FEXOFENADINE HCL) Take 1 tablet by mouth daily;  #31 x 3;  Signed;  Entered by: Vassie Loll MD;  Authorized by: Vassie Loll MD;  Method used: Electronically to [facility] [phone]*, [address] [place], [place]  [address], Ph: [phone], Fax: [phone]  Impression & Recommendations:  Problem # 1:  ALLERGIC RHINITIS DUE TO POLLEN (ICD-477.0) Patient with allergic rhinitis, currently not responding to claritin. plan was for initial tx with allegra two times a day and eventually make transition to once a day; due to insurance coverage will change prescription to once a day.  Complete Medication List: 1)  Alprazolam 0.25 Mg Tabs (Alprazolam) .... Take one tablet at bedime for sleep. 2)  Pravachol 20 Mg Tabs (Pravastatin sodium) .... Take one tablet daily to decrease cholesterol. 3)  Ambien 10 Mg Tabs (Zolpidem tartrate) .... Take one tablet at bedtime. 4)  Deep Sea Nasal Spray 0.65 % Soln (Saline) .... Use 3-4  sprays per nostril per day.  use every day as needed. 5)  Megace Es 625 Mg/20ml Susp (Megestrol acetate) .... Take 5ml once daily. 6)  Allegra 180 Mg Tabs (Fexofenadine hcl) .... Take 1 tablet by mouth daily 7)  Remeron 15 Mg Tabs (Mirtazapine) .... Take 1 tab by mouth at bedtime     Prescriptions: ALLEGRA 180 MG TABS (FEXOFENADINE HCL) Take 1 tablet by mouth daily  #31 x 3   Entered and Authorized by:   Vassie Loll MD   Signed by:   Vassie Loll MD on 04/05/2010   Method used:    Electronically to        [facility] [phone]* (retail)       [address]       [place], [place]  [address]       Ph: [phone]       Fax: [phone]   RxID:   [id number]    Appended Document: vag itching x 3 days, r/t to abx?/pcp-illath/hla Allegra Rx was changed to one daily and correction called into [facility], also changed # to 31 a month per order Dr Gwenlyn Perking.

## 2010-09-24 NOTE — Progress Notes (Signed)
Summary: vag itching/ hla  Phone Note Call from Patient   Summary of Call: pt calls to say she was seen in the emergent setting for a tooth and given abx, she now has vaginal itching, pt states this has been ongoing for 3 days, denies any other abnormal symptoms. has tried anything to help and nothing except wearing underwear makes it worse. appt is given this pm Initial call taken by: Marin Roberts RN,  March 17, 2010 11:58 AM

## 2010-09-24 NOTE — Progress Notes (Signed)
Summary: refill/gg  Phone Note Refill Request  on April 01, 2010 3:17 PM  Refills Requested: Medication #1:  AMBIEN 10 MG TABS Take one tablet at bedtime.  Medication #2:  ALPRAZOLAM 0.25 MG TABS Take one tablet at bedime for sleep.  Medication #3:  PRAVACHOL 20 MG TABS Take one tablet daily to decrease cholesterol.  Medication #4:  MEGESTROL ACETATE 800 MG/20ML SUSP Take 15ml once a day.  Method Requested: Electronic Initial call taken by: Merrie Roof RN,  April 01, 2010 3:19 PM  Follow-up for Phone Call        Pt seen in July for an acute visit. Last continuity visit was in Nov. She has sig med problems and is on meds with sig side effects - Megace. Has Aug 15 appt. Will refill one month and allow Dr Loistine Chance to decide whether to continue these meds. Follow-up by: Blanch Media MD,  April 01, 2010 3:38 PM  Additional Follow-up for Phone Call Additional follow up Details #1::        I called pharmacy and cancelled Rx for Ambien as I am not sure Dr Rogelia Boga realized pt just got Rx for REMERON 15 MG TABS to take at hs also.  Pt has appointment on Monday and will review meds at that time. This was per order Dr Coralee Pesa Additional Follow-up by: Merrie Roof RN,  April 02, 2010 3:58 PM    New/Updated Medications: MEGACE ES 625 MG/5ML SUSP (MEGESTROL ACETATE) Take 5mL once daily. Prescriptions: DEEP SEA NASAL SPRAY 0.65 % SOLN (SALINE) Use 3-4 sprays per nostril per day.  Use every day as needed.  #1 bottle x 0   Entered and Authorized by:   Blanch Media MD   Signed by:   Blanch Media MD on 04/01/2010   Method used:   Telephoned to ...       [facility]  [address] [phone]* (retail)       [address]       [place], [place]  [address]       Ph: [phone]       Fax: [phone]   RxID:   [id number] MEGACE ES 625 MG/5ML SUSP (MEGESTROL ACETATE) Take 5mL once daily.  #150 mL x 0   Entered and Authorized by:   Blanch Media MD   Signed by:   Blanch Media MD on 04/01/2010   Method used:   Telephoned to ...       [facility]  [address] [phone]* (retail)       [address]       [place], [place]  [address]       Ph: [phone]       Fax: [phone]   RxID:   [id number] AMBIEN 10 MG TABS (ZOLPIDEM TARTRATE) Take one tablet at bedtime.  #32 x 0   Entered and Authorized by:   Blanch Media MD   Signed by:   Blanch Media MD on 04/01/2010   Method used:   Telephoned to ...       [facility]  [address] [phone]* (retail)       [address]       [place], [place]  [address]       Ph: [phone]       Fax: [phone]   RxID:   [id number] ALPRAZOLAM 0.25 MG TABS (ALPRAZOLAM) Take one tablet at bedime for sleep.  #8 x 0   Entered and Authorized by:   Blanch Media MD   Signed  by:   Blanch Media MD on 04/01/2010   Method used:   Telephoned to ...       [facility]  [address] [phone]* (retail)       [address]       [place], [place]  [address]       Ph: [phone]       Fax: [phone]   RxID:   [id number]

## 2010-09-24 NOTE — Progress Notes (Signed)
Summary: PRIOR aUTHORIZATION FOR ZOLPIDEM  Phone Note Outgoing Call   Call placed by: Angelina Ok RN,  November 04, 2009 10:18 AM Call placed to: Insurer Summary of Call: Call for Prior Authorization on Zolpidem 10 mg tablets.  Medication had been approved for 07/22/2009 thru 01/18/2010. Angelina Ok RN  November 04, 2009 10:19 AM  Initial call taken by: Angelina Ok RN,  November 04, 2009 10:19 AM

## 2010-09-29 ENCOUNTER — Telehealth: Payer: Self-pay | Admitting: *Deleted

## 2010-09-29 ENCOUNTER — Other Ambulatory Visit: Payer: Self-pay

## 2010-09-29 NOTE — Telephone Encounter (Signed)
Pt. had future labs- CBC and Lipid profile- ordered 07/14/10 by Dr. Cathey Endow; French Ana in the lab stated MD still wanted these labs done, so pt. Was called.

## 2010-10-06 ENCOUNTER — Other Ambulatory Visit: Payer: Self-pay

## 2010-11-03 LAB — HEMOCCULT GUIAC POC 1CARD (OFFICE): Fecal Occult Bld: NEGATIVE

## 2010-11-03 LAB — POCT I-STAT, CHEM 8
BUN: 3 mg/dL — ABNORMAL LOW (ref 6–23)
BUN: 9 mg/dL (ref 6–23)
Calcium, Ion: 1.16 mmol/L (ref 1.12–1.32)
Calcium, Ion: 1.2 mmol/L (ref 1.12–1.32)
Chloride: 105 mEq/L (ref 96–112)
Chloride: 110 mEq/L (ref 96–112)
Creatinine, Ser: 0.7 mg/dL (ref 0.4–1.2)
Creatinine, Ser: 0.8 mg/dL (ref 0.4–1.2)
Glucose, Bld: 103 mg/dL — ABNORMAL HIGH (ref 70–99)
Glucose, Bld: 84 mg/dL (ref 70–99)
HCT: 44 % (ref 36.0–46.0)
HCT: 46 % (ref 36.0–46.0)
Hemoglobin: 15 g/dL (ref 12.0–15.0)
Hemoglobin: 15.6 g/dL — ABNORMAL HIGH (ref 12.0–15.0)
Potassium: 3.1 mEq/L — ABNORMAL LOW (ref 3.5–5.1)
Potassium: 3.2 mEq/L — ABNORMAL LOW (ref 3.5–5.1)
Sodium: 143 mEq/L (ref 135–145)
Sodium: 143 mEq/L (ref 135–145)
TCO2: 22 mmol/L (ref 0–100)
TCO2: 28 mmol/L (ref 0–100)

## 2010-11-03 LAB — CBC
HCT: 39.2 % (ref 36.0–46.0)
Hemoglobin: 13.9 g/dL (ref 12.0–15.0)
MCH: 33 pg (ref 26.0–34.0)
MCHC: 35.5 g/dL (ref 30.0–36.0)
MCV: 93.1 fL (ref 78.0–100.0)
Platelets: 216 10*3/uL (ref 150–400)
RBC: 4.21 MIL/uL (ref 3.87–5.11)
RDW: 12.8 % (ref 11.5–15.5)
WBC: 8.4 10*3/uL (ref 4.0–10.5)

## 2010-11-03 LAB — DIFFERENTIAL
Basophils Absolute: 0 10*3/uL (ref 0.0–0.1)
Basophils Relative: 0 % (ref 0–1)
Eosinophils Absolute: 0.2 10*3/uL (ref 0.0–0.7)
Eosinophils Relative: 2 % (ref 0–5)
Lymphocytes Relative: 34 % (ref 12–46)
Lymphs Abs: 2.8 10*3/uL (ref 0.7–4.0)
Monocytes Absolute: 0.7 10*3/uL (ref 0.1–1.0)
Monocytes Relative: 9 % (ref 3–12)
Neutro Abs: 4.6 10*3/uL (ref 1.7–7.7)
Neutrophils Relative %: 55 % (ref 43–77)

## 2010-11-03 LAB — APTT: aPTT: 29 seconds (ref 24–37)

## 2010-11-03 LAB — PROTIME-INR
INR: 0.98 (ref 0.00–1.49)
Prothrombin Time: 13.2 seconds (ref 11.6–15.2)

## 2010-11-10 ENCOUNTER — Other Ambulatory Visit: Payer: Self-pay | Admitting: *Deleted

## 2010-11-11 NOTE — Telephone Encounter (Signed)
Last BMI 22. I refilled this med last year and asked that it be addressed at the next appt and it wasn't. No AIDS, no cancer. BMI not underweight. Med not appropriate.

## 2010-11-23 ENCOUNTER — Other Ambulatory Visit: Payer: Self-pay | Admitting: *Deleted

## 2010-11-25 LAB — T-HELPER CELLS (CD4) COUNT (NOT AT ARMC)
CD4 % Helper T Cell: 50 % (ref 33–55)
CD4 T Cell Abs: 1000 uL (ref 400–2700)

## 2010-11-25 NOTE — Telephone Encounter (Signed)
Patient needs to come in to the clinic and be evaluated if she if a refill is appropriate.

## 2010-11-26 NOTE — Telephone Encounter (Signed)
Message to scheduler to schedule pt with an appointment.  Denial faxed to pt's pharmacy.

## 2011-01-08 NOTE — H&P (Signed)
NAME:  Emily Cameron, Emily Cameron                          ACCOUNT NO.:  [id number]   MEDICAL RECORD NO.:  [id number]                   PATIENT TYPE:  INP   LOCATION:  0351                                 FACILITY:  [facility]   PHYSICIAN:  Jackie Plum, M.D.             DATE OF BIRTH:  1961/07/13   DATE OF ADMISSION:  03/14/2004  DATE OF DISCHARGE:                                HISTORY & PHYSICAL   CHIEF COMPLAINT:  Chest pain and back pain.   HISTORY OF PRESENT ILLNESS:  The patient is a 50 year old African-American  lady with history of cigarette smoking.  No cardiac issues.  Presents with a  history of sternal chest pain and back pain.  Pain was said to be constant,  worsened by a deep breath without any known alleviating factor.  It was said  to be sharp and moderate in intensity.  She denies any history of incidental  traumatic injuries.  No history of palpitations, PND, orthopnea, hematuria,  hemoptysis, melena, bright red blood per rectum.  She does not have any  history of peptic ulcer disease.  Denied any history of fever or chills,  cough or sputum production.  She has not noted any cough or leg pain or  ankle swelling.  In the emergency room CT scan of the chest was done on  account of chest pain with elevated D-dimer and it came out to be positive  for pulmonary embolism.  Official report is still pending.   PAST MEDICAL HISTORY:  1. Kidney stones.  2. Anxiety.  3. She is status post hysterectomy.  4. She does not have a history of hypertension, diabetes, or dyslipidemia.   FAMILY HISTORY:  Positive for heart disease in her sister and diabetes  mellitus.  She also gives history of blood clot in her mother.   MEDICATIONS:  Xanax.   ALLERGIES:  She is not allergic to any known medications.   SOCIAL HISTORY:  The patient is unemployed.  She smokes half a pack to one  pack of cigarettes daily for several years, about 25 years so far according  to the patient.  She drinks  alcohol occasionally on social basis.  Does not  use any illicit drugs.   REVIEW OF SYSTEMS:  Significant positives/negatives as noted in HPI.  Remainder of review of systems was unremarkable.   PHYSICAL EXAMINATION:  VITAL SIGNS:  Blood pressure was 110/62, pulse 75,  temperature 99.5 degrees Fahrenheit, respiratory rate 18, saturation of 97%.  GENERAL:  Significant for African-American lady lying on a stretcher.  She  was not in acute cardiopulmonary distress.  HEENT:  Normocephalic, atraumatic.  Pupils are equal, round, and reactive to  light.  Extraocular movements are intact.  Pharynx was moist without  exudate, erythema.  NECK:  Supple.  No JVD.  No thyromegaly.  CHEST:  No producible chest wall tenderness, fascicular breath sounds which  were adequate without any  wheezes or crackles.  CARDIAC:  Notable for regular rhythm without any gallops or murmur.  ABDOMEN:  Soft, nontender.  EXTREMITIES:  Negative for any edema.  NEUROLOGIC:  She was alert and oriented x3.  No acute __________.   LABORATORIES:  A 12-lead EKG showed sinus rhythm at 87 beats per minute  without any acute ST-wave changes.  CT scan as noted above.  WBC count 12.5,  hemoglobin 14.2, hematocrit 41.6, MCV 90.7, platelet count 250.  Point of  care cardiac enzymes first set was negative.  Sodium 135, potassium 3.7,  chloride 105, CO2 32, glucose 80, BUN 5, potassium 0.6, calcium 9.   ASSESSMENT:  Pulmonary embolism in a 50 year old lady without any history of  __________ diabetes.  She has history of cigarette smoking.  She is admitted  to telemetry bed.  Will start her on heparin and subsequently bridge her up  with Coumadin.  She needs a work-up for hypercoagulability.  She will  received smoking cessation counseling while she is in the hospital.  We will  follow up on the official results of the CT scan.                                               Jackie Plum, M.D.    GO/MEDQ  D:  03/14/2004  T:   03/14/2004  Job:  [id number]

## 2011-01-08 NOTE — Discharge Summary (Signed)
NAME:  Emily Cameron, Emily Cameron                          ACCOUNT NO.:  [id number]   MEDICAL RECORD NO.:  [id number]                   PATIENT TYPE:  INP   LOCATION:  9306                                 FACILITY:  [facility]   PHYSICIAN:  Phil D. Okey Dupre, M.D.                  DATE OF BIRTH:  [birth year]   DATE OF ADMISSION:  10/21/2003  DATE OF DISCHARGE:                                 DISCHARGE SUMMARY   HOSPITAL COURSE:  The patient, a 50 year old black female, was admitted  because of symptomatic fibroids which caused secondary anemia.  On the day  of admission she was taken to the OR and total abdominal hysterectomy was  undertaken.  The patient has had a satisfactory postoperative course, has  been completely afebrile.  Physical examination on discharge:  The lungs are  clear. The abdomen is soft, flat, normally tender, with a clean incision,  and active bowel sounds but no flatus as yet.  The patient is voiding well.  Extremities are negative.  The patient had a preoperative hemoglobin of 40  with a hematocrit of 13.1 and at day of discharge she had a 33 hematocrit  with a 10.9 hemoglobin.  Her white count was 10.3 at discharge.  The  pathology has not returned as yet.  The patient has been given detailed  instructions as to activity with special emphasis on stairs and lifting.  As  to follow-up, she is to return on Monday, October 28, 2003 to the [facility] for  staple removal and to be seen in the GYN clinic in 2 weeks.  Dietary  instructions have been given to the patient and she has been encouraged to  eat a high fiber diet and drink plenty of fluids.   DISCHARGE DIAGNOSIS:  Satisfactory, status post total abdominal  hysterectomy.                                               Phil D. Okey Dupre, M.D.    PDR/MEDQ  D:  10/23/2003  T:  10/23/2003  Job:  [id number]

## 2011-01-08 NOTE — H&P (Signed)
NAME:  Emily Cameron, Emily Cameron                          ACCOUNT NO.:  [id number]   MEDICAL RECORD NO.:  [id number]                   PATIENT TYPE:  OUT   LOCATION:  OPC                                  FACILITY:  [facility]   PHYSICIAN:  Phil D. Okey Dupre, M.D.                  DATE OF BIRTH:  Jul 27, 1961   DATE OF ADMISSION:  10/17/2003  DATE OF DISCHARGE:                                HISTORY & PHYSICAL   DATE OF PHYSICAL EXAMINATION:  October 17, 2003   DATE OF SURGERY AT [facility] HOSPITAL:  October 21, 2003   HISTORY OF PRESENT ILLNESS:  Symptomatic fibroids with secondary anemia.   The patient is a 50 year old black female gravida 6 para 6-0-0-6 who was  referred in Fall 2004 because of severe menorrhagia and chronic anemia, had  a hemoglobin of around 6, from the internal medicine department.  At that  time she was found to have large leiomyomata almost up to the umbilicus with  a uterus that sounded 14 cm.  She was placed in November on Lupron Depot and  got a second injection in January 2005.  During that time she was able to  build her hemoglobin up to normal and on the day of the exam she had a  hematocrit of 40 and a hemoglobin of 13.  This was up from a hematocrit of  25 and a hemoglobin of 8.2 on January 11.  However, unfortunately, the  uterus has not shrunken down the way that we had hoped, so the patient is  scheduled for total abdominal hysterectomy.  We had a long consult with the  patient today and discussed leaving the ovaries unless they were severely  pathological.  We talked about the possible different complications  especially those related to anesthesia; injury to bowel, blood vessel,  urinary tract; and postoperative infection.  We discussed postoperative care  and activities with the patient.   PAST MEDICAL HISTORY:  The patient is a smoker.  Otherwise, in fairly good  health.  She had had surgery previously for a kidney stone and has had two  Bartholin abscesses  operated on in the past.  All her deliveries of her six  children were normal vaginal deliveries, and she has positive sickle cell  trait.   SOCIAL HISTORY:  She smokes a pack of cigarettes a day and has for many  years.   ALLERGIES:  No known allergies.   CURRENT MEDICATIONS:  Ferrous sulfate and Xanax.   FAMILY HISTORY:  Heart attacks, diabetes, and stroke, with many cases of  hypertension.   REVIEW OF SYSTEMS:  Negative with exception of the present illness which  gives her abdominal pressure and pelvic pressure.  Also she has been fairly  weak feeling because of the anemia.  She has anxiety attacks frequently for  which she is on the Xanax.   PHYSICAL EXAMINATION:  VITAL SIGNS:  Blood pressure is 106/55 with a pulse  of 88, respirations are 18 per minute, temperature is 98.6.  The weight is  121.7 pounds.  GENERAL:  Well-developed, well-nourished, black, slender female in no acute  distress.  HEENT:  Within normal limits.  PERRLA.  NECK:  Supple with no masses.  Thyroid is symmetrical with no masses.  BACK:  Erect.  BREASTS:  Symmetrical with no dominant masses and no nipple discharge.  HEART:  No murmur, normal sinus rhythm.  LUNGS:  Clear to auscultation and percussion.  ABDOMEN:  Soft, flat, with palpable mass up to the umbilicus, seems  symmetrical.  Nontender, no rebound, no guarding.  No CVA tenderness.  PELVIC:  External genitalia is normal.  BUS within normal limits.  The  vagina is clean and well rugated.  The cervix is clean and parous.  The  uterus is about the size of an 18-week pregnancy.  Adnexa could not be  palpated.  RECTAL:  There are no masses, hemoccult negative.  EXTREMITIES:  Normal without edema and no varices.  SKIN:  Normal turgor.   IMPRESSION:  1. Symptomatic leiomyomata uteri.  The patient for total abdominal     hysterectomy.  2. History of anxiety.   ADDENDUM:  Normal Pap smear and normal endometrial biopsy.                                                Phil D. Okey Dupre, M.D.    PDR/MEDQ  D:  10/17/2003  T:  10/17/2003  Job:  [id number]

## 2011-01-08 NOTE — Discharge Summary (Signed)
NAME:  Emily Cameron, Emily Cameron                          ACCOUNT NO.:  [id number]   MEDICAL RECORD NO.:  [id number]                   PATIENT TYPE:  INP   LOCATION:  5740                                 FACILITY:  [facility]   PHYSICIAN:  Artist Beach, MD                     DATE OF BIRTH:  [birth year]   DATE OF ADMISSION:  03/19/2004  DATE OF DISCHARGE:  03/24/2004                                 DISCHARGE SUMMARY   DISCHARGE MEDICATIONS:  1. Nicotine patch 21 mg one q.d.  2. Ambien 5 mg one q.d. at night orally.  3. Coumadin 2.5 and 5 mg on alternate days, until she follows the physician     on Thursday on the Coumadin Clinic.  4. Protonix 5 mg once daily.   PROBLEMS  #1 - HEMOPTYSIS.  With INR of 6.70.  #2 - PULMONARY EMBOLISM.  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  Two small irregular masses at the right lung apex.  #3 - TOBACCO ABUSE.  Trying to quit.  #4 - POLYSUBSTANCE ABUSE.  Has been clean for six months.  #5 - GENERAL ANXIETY DISORDER.  #6 - HISTORY OF GASTROESOPHAGEAL REFLUX DISEASE.  #7 - SICKLE CELL TRAIT.   PROCEDURE:  Chest x-ray showed left lower lobe consolidated airspace disease  and basilar atelectasis with small effusion -- consistent with an area of  pulmonary infarction, which is resolving.   EKG reveals right bundle branch block pattern; nothing unchanged.   DIAGNOSTIC TESTING:  Cardiac enzymes -- three sets were negative on  admission.   CONSULTATIONS:  None.   HISTORY OF PRESENT ILLNESS:  This is a 50 year old, African-American female  with history of pulmonary embolism March 14, 2004.  She is now on Warfarin  therapy; presented with two-day history of hemoptysis.  She was in the  hospital from March 14, 2004 through March 17, 2004, secondary to pulmonary  embolism which caused considerable chest pain.  The patient's chest pain,  which she characterized as feeling of pressure behind the left intercostal  space; that got worse with exertion and anxiety,  improved with rest and  radiated to her back.  Continued after discharge and was followed by  hemoptysis one day before admission.  The patient reports coughing up two  tablespoons of blood with some sputum mixed over the last two days.  Also  complains of bleeding from the gums when she brushed her teeth.  She  complained of nausea and dizziness when she stands up.  No history of  dyspnea.  The patient denies vomiting, diarrhea, hematochezia, hemoptysis,  melena, hematuria and joint pain or swelling.   HOSPITAL COURSE:  During her stay in the hospital she has improved in terms  of no cough, no history of hemoptysis.  Her INR has returned from 6.7 to a  stable of 2.9 today, and 3.1 yesterday.  DISPOSITION:  So, she is being discharged with her a stable Coumadin, and  followed up on Thursday with Dr. __________ in the Coumadin Clinic at 3 p.m.   DISCHARGE LABS:  Sodium 134, potassium 4.0, chloride 100, bicarbonate 27.  BNP 7, creatinine 0.8, glucose 84, calcium 0.8.  Hemoglobin 12.2, hematocrit  36.1, WBC 9.1, platelets 564.  INR today is 2.9.  Prothrombin time 20.7.  A  urine culture was negative.  Her INR on presentation was 6.7.  Antithrombin-  3 85 (which is normal).  PTT 52.  Lupus anticoagulant  negative.  Cardiolipin negative.  Antibody __________ negative.  Total protein C 63,  (which is low normal function).  Protein C was 85 (which is low).  Total  protein S 88 (which is normal function).  Protein S was 59 (low).   ASSESSMENT AND PLAN:  1. HEMOPTYSIS.  The patient was supratherapeutic on warfarin on admission     (6.7), which has been controlled with alternate regimen of 2.5 and 5 mg     of Coumadin.  She stands on INR of 2.9 today, and follows up with Dr.     ___________ on Thursday; he will manage the patient further regarding     Coumadin.  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   1. LUNG MASS.   Suspicious with history of smoking.  Will consider biopsy on     later course if the mass has changed.  Need to follow up in three months     with x-ray.   1. GASTROESOPHAGEAL REFLUX DISEASE.  She is on Protonix 40 mg for control of     her symptoms.  She is guaiac negative.   1. TOBACCO ABUSE.  The patient is using patch and has been continuously     encouraged to quit smoking.  She is doing well on nicotine patch.   FOLLOW UP:  The patient is discharged on March 24, 2004; to follow up on  March 26, 2004 in Coumadin Clinic.                                                Artist Beach, MD    SP/MEDQ  D:  03/24/2004  T:  03/24/2004  Job:  [id number]   cc:   Chapman Fitch, MD  Fax: [phone]

## 2011-01-08 NOTE — H&P (Signed)
[facility]  Patient:    Emily Cameron, Emily Cameron [facility] Visit Number: [id number] MRN: [id number]          Service Type: GYN Location: [facility] Attending Physician:  Michaelle Copas Dictated by:   Roseanna Rainbow, M.D. Admit Date:  07/22/2001 Discharge Date: 07/22/2001   CC:         [facility]   History and Physical  CHIEF COMPLAINT:              The patient is a 50 year old with a left-sided Bartholin cyst.  HISTORY OF PRESENT ILLNESS:   The patient has had a right-sided Bartholin abscess on the right that has been marsupialized in May 2000.  In August 2001, she had a recurrence of a left-sided Bartholin gland cyst.  I&D were performed, and a Word catheter was placed; however, the Word catheter was not allowed to remain in to form a fistulous tract.  The patient presents today with a painful left-sided Bartholin cyst.  ALLERGIES:                    No known drug allergies.  MEDICATIONS:                  None.  PAST OB/GYN HISTORY:          She has a history of trichomatosis and gonorrhea.  She is status post a bilateral tubal ligation and six vaginal deliveries.  PAST MEDICAL HISTORY:         Nephrolithiasis.  SOCIAL HISTORY:               One-half pack per day tobacco use.  Possible chronic alcoholism with six-pack of beer per day use.  FAMILY HISTORY:               She denies.  PHYSICAL EXAMINATION:  VITAL SIGNS:                  Pulse 78, blood pressure 95/46, weight 130.9.  GENERAL:                      Well-developed, well-nourished.  No significant distress.  HEENT:                        Normocephalic, atraumatic.  NECK:                         Supple.  LUNGS:                        Clear to auscultation bilaterally.  HEART:                        Regular rate and rhythm.  ABDOMEN:                      No organomegaly.  Soft, nontender.  PELVIC:                       The left Bartholin gland is  enlarged, approximately 2 to 3 cm in diameter, cystic, slightly tender.  There is also some cellulitis extending and edema involving the left labia as well. Speculum exam was deferred.  EXTREMITIES:                  No clubbing, cyanosis,  or edema.  ASSESSMENT:                   Left Bartholin cyst.  Rule out abscess, recurrent.  PLAN:                         Marsupialization of left Bartholin gland. ictated by:   Roseanna Rainbow, M.D. Attending Physician:  Michaelle Copas DD:  07/25/01 TD:  07/25/01 Job: [id number]

## 2011-01-08 NOTE — Op Note (Signed)
NAME:  Emily Cameron, Emily Cameron                          ACCOUNT NO.:  [id number]   MEDICAL RECORD NO.:  [id number]                   PATIENT TYPE:  INP   LOCATION:  9306                                 [facility]ACILITY[facility]   PHYSICIAN:  Phil D. Okey Dupre, M.D.                  DATE OF BIRTH:  1961/06/18   DATE OF PROCEDURE:  10/21/2003  DATE OF DISCHARGE:                                 OPERATIVE REPORT   PROCEDURE:  Total abdominal hysterectomy.   PREOPERATIVE DIAGNOSIS:  Symptomatic fibroids.   POSTOPERATIVE DIAGNOSIS:  Symptomatic fibroids.   ANESTHESIA:  General.   SURGEON:  Phil D. Okey Dupre, M.D.   ASSISTANT:  Lesly Dukes, M.D.   ESTIMATED BLOOD LOSS:  250 mL.   PATHOLOGY SPECIMEN:  Uterus.   OPERATIVE FINDINGS:  On entry into the peritoneal cavity, the uterus was  found to be about the size of a 14 week pregnancy, markedly irregular, with  multiple intramural leiomyomata uteri.  The procedure went as follows:  Under satisfactory general anesthesia with the patient in a dorsal supine  position, the abdomen was prepped and draped in the usual sterile manner.  The vagina had been previously prepped and a Foley catheter in the urinary  bladder.  The abdomen was entered through a transverse suprapubic incision,  measuring and situated 3 cm above the symphysis pubis, extending for a total  length of 16 cm.  The abdomen was entered by layers in a Pfannenstiel  fashion.  I then entered into the peritoneal cavity.  The uterus was  exteriorized and was as described above.  The ovaries and tubes were normal.  A Balfour retractor was placed into the peritoneal cavity for exposure and  the bowel packed away.  Straight Kocher clamps were placed across parallel  to the uterus and across the fallopian tubes with the meso beneath it and  down to conclude the round ligament, and this was used for traction as well  as hemostasis.  The round ligaments were then ligated, divided, and the  anterior leaf of  the broad ligament opened parallel to the uterus and  extending around the anterior-superior portion of the cervix.  The sutures  were then cut short, openings made in the avascular portions of broad  ligament, sutures placed through that and tied medial to the ovary to  include all the tissue from the round ligament, up.  The Kocher clamp was  placed medial to the aforementioned tie, tissue medially divided, and the  lateral pedicle ligated with 1 chromic catgut suture ligature, and all  sutures cut short.  The bladder was pushed away from the lower uterine  segment by blunt dissection, the uterine vessels skeletonized, doubly  clamped, doubly divided, doubly ligated with 1 chromic catgut suture  ligatures.  The cardinal ligaments were then clamped, divided, and ligated  with 1 chromic catgut suture of ligature.  The cervix  was dissected away  from the apex of the vagina and the uterus removed en toto and sent for  pathological diagnosis.  Angled sutures of 1 chromic were placed in each of  the lateral uterine cuff angles and figure-of-eights used to close the  vaginal cuff.  Area is observed for bleeding; none was noted.  The pelvis  was irrigated.  Sponges, the L4 retractor, and lap tapes were removed, the  upper abdominal viscera explored.  The large bowel was placed over the  pelvic suture line so that the ovaries would not drop into the cul-de-sac.  Some small bleeders on the surface of the muscle were coagulated with  electrocautery.  The fascia was then closed with a continuous running 0  chromic suture __________ either __________ incision, meeting in the  midpoint.  Subcutaneous bleeders were controlled with hot cautery, skin  staples used with skin edge approximation, dry sterile dressings applied.  The patient transferred to the recovery room in satisfactory condition with  250 mL blood loss.  Tape, instrument, sponge, and needle count were reported  correct at the end of the  procedure.  The Foley catheter was draining clear-  amber urine.                                               Phil D. Okey Dupre, M.D.    PDR/MEDQ  D:  10/21/2003  T:  10/21/2003  Job:  [id number]

## 2011-01-08 NOTE — Group Therapy Note (Signed)
NAME:  Emily Cameron, Emily Cameron                          ACCOUNT NO.:  [id number]   MEDICAL RECORD NO.:  [id number]                   PATIENT TYPE:  OUT   LOCATION:  [facility]                           FACILITY:  [facility]   PHYSICIAN:  Argentina Donovan, MD                     DATE OF BIRTH:  02/27/1961   DATE OF SERVICE:  02/11/2004                                    CLINIC NOTE   REASON FOR VISIT:  The patient is a 50 year old black female who underwent  abdominal hysterectomy in February 2005.  She has been on Xanax long-term  and went to the mental health facility where they took her off of it and  placed her on Paxil.  She was unable to tolerate the Paxil.  She is not on  Xanax at the present time but her chief complaint is sweats and hot flashes,  night sweats, and insomnia.  We told her we would screen her for hormone  levels and did an FSH, LH, and estradiol on her, and depending on whether or  not she is menopausal, we will treat her.  If she is, we will treat her with  estrogen.  If she is not, we will refer her back to mental health and  probably screen her for other causes of hot sweats.                                               Argentina Donovan, MD    PR/MEDQ  D:  02/11/2004  T:  02/12/2004  Job:  [id number]

## 2011-01-08 NOTE — Discharge Summary (Signed)
NAME:  Emily, Cameron                          ACCOUNT NO.:  [id number]   MEDICAL RECORD NO.:  [id number]                   PATIENT TYPE:  INP   LOCATION:  0351                                 FACILITY:  [facility]   PHYSICIAN:  Sherin Quarry, MD                   DATE OF BIRTH:  1961/08/17   DATE OF ADMISSION:  03/14/2004  DATE OF DISCHARGE:                                 DISCHARGE SUMMARY   HISTORY:  Emily Cameron is a 50 year old lady with a long-standing history  of cigarette smoking who initially presented on March 14, 2004 with  substernal pleuritic chest pain described as sharp.  This was not associated  with palpitations, PND, hemoptysis.  In the emergency room a CT scan of the  chest was performed which showed a long segment left lower lobe pulmonary  embolus with associated left lower lobe pulmonary infarct.  The patient was  therefore admitted for management of this problem.  Physical exam at the  time of admission is described by Dr. Julio Sicks.  The blood pressure was  110/62, pulse was 75, temperature was 99, respirations 18, O2 saturation was  97%.  HEENT exam was within normal limits.  The chest was remarkable for the  absence of wheezes or rhonchi.  Cardiovascular exam revealed normal S1 and  S2 without rubs, murmurs or gallops.  The abdomen was benign.  Neurologic  testing and examination of the extremities was normal.   LABORATORY STUDIES OBTAINED:  Included a sodium of 135, potassium of 2.8,  creatinine was 0.6, BUN of 5.  Serial cardiac enzymes were negative.  A  coagulation panel including factor S, factor C, and antiphospholipid  antibodies were obtained and these are pending.  The patient was placed on  heparin per pharmacy protocol.  Coumadin was initiated and patient appeared  to tolerate this regimen well.  By March 17, 2004 INR was 2.5 and it was felt  reasonable to manage the patient as an outpatient.  Her O2 saturation was  97%.  It should be pointed out that  in addition on CT scan of the chest the  patient was noted to have 2 small irregular mass-like densities in the right  apex and a follow up CT was recommended in 3 months.  On March 17, 2004 the patient was discharged.   DISCHARGE DIAGNOSES:  1. Pulmonary embolus with infarct.  2. Past history of kidney stones.  3. Chronic anxiety.  4. Status post hysterectomy.  5. Abnormal CT scan of the chest as described above.  6. Thirty pack year smoking history.   DISCHARGE MEDICATIONS:  1. Coumadin 5 mg on Tuesday, 5 mg on Wednesday, she is then instructed to     return to the [facility] for prothrombin time and INR on     Thursday.  The importance of attendance at the clinic and follow up of  prothrombin time was very strongly emphasized.  2. She was advised to take K-Dur 20 mEq daily. She is to refrain from     smoking cigarettes.  3. Darvocet 1-2 q.4h p.r.n. for pain.                                              Sherin Quarry, MD   SY/MEDQ  D:  03/17/2004  T:  03/17/2004  Job:  [id number]   cc:   [facility]

## 2011-01-08 NOTE — Group Therapy Note (Signed)
NAME:  Emily Cameron, Emily Cameron NO.:  [id number]   MEDICAL RECORD NO.:  [id number]                   PATIENT TYPE:  OUT   LOCATION:  [facility]                           FACILITY:  [facility]   PHYSICIAN:  Ellis Parents, MD                 DATE OF BIRTH:  1961/06/25   DATE OF SERVICE:  06/25/2003                                    CLINIC NOTE   CHIEF COMPLAINT:  This 50 year old gravida 6 para 6; last menstrual period  June 16, 2003; is referred from internal medicine clinic because of  severe menorrhagia and chronic anemia.  The patient was seen in internal  medicine on May 21, 2003 for menorrhagia and anemia.  She had a  previous history of a hemoglobin of 6.0 and a ferritin of 1.  Her menses  have been profuse for the past six months although cyclic.  She bleeds  five to six days requiring 18 pads a day, large clots, and severe  progressive dysmenorrhea.  The patient has no intermenstrual bleeding.  She  is currently on ferrous sulfate 325 mg t.i.d. and her hemoglobin on  September 28 was reported at 10.3 grams and hematocrit 32%.   PAST MEDICAL HISTORY:  She is positive for sickle cell trait.   SURGICAL HISTORY:  She had an excision of a Bartholin duct cyst  approximately eight years ago.  The patient also has history of  polysubstance abuse but has been off illicit drugs for about eight years.  She smokes probably a pack a day.   CURRENT MEDICATIONS:  Ferrous sulfate and Xanax.   PHYSICAL EXAMINATION:  ABDOMEN:  Soft and nontender.  There is an irregular  mass arising from the pelvis to about one to two fingerbreadths below the  umbilicus compatible with 18-19 weeks size.  PELVIC:  External genitalia are normal.  The vagina is clean.  The cervix is  well epithelialized.  The uterus is enlarged approximately 18 weeks size  with a small pedunculated fibroid arising from the left side up to the level  of the umbilicus.  The mass is irregular and  nodulate although it is  slightly soft.  The pelvic sidewalls are free.  An endometrial biopsy is  performed.  The uterus is deep and sounds to 14 cm in depth.  A generous  specimen was obtained.  GC and chlamydia probes were performed.  Pap smear  was performed.   The patient was given Lupron 3.75 mg IM and is to return in three weeks for  repeat CBC and discussion of probable TAH.                                               Ellis Parents, MD    SA/MEDQ  D:  06/25/2003  T:  06/25/2003  Job:  [id number]

## 2011-01-08 NOTE — Op Note (Signed)
[facility]  Patient:    Emily Cameron, Emily Cameron [facility] Visit Number: [id number] MRN: [id number]          Service Type: DSU Location: [facility] Attending Physician:  Tammi Sou Dictated by:   Bing Neighbors Clearance Coots, M.D. Proc. Date: 07/26/01 Admit Date:  07/26/2001   CC:         [facility]   Operative Report  PREOPERATIVE DIAGNOSES:       Left Bartholin gland cyst.  POSTOPERATIVE DIAGNOSES:      Left Bartholin gland cyst.  PROCEDURE:                    Marsupialization of left Bartholin gland cyst.  SURGEON:                      Charles A. Clearance Coots, M.D.  ANESTHESIA:                   General.  ESTIMATED BLOOD LOSS:         Negligible.  COMPLICATIONS:                None.  SPECIMEN:                     None.  OPERATION:                    Patient was brought to the operating room and after satisfactory general endotracheal anesthesia the vagina was prepped and draped in the usual sterile fashion.  A large left Bartholin gland cyst was then palpated and the cyst was then punctured through the skin and internal cyst capsule and moderate amount of pus was expelled.  The incision was then extended superiorly and inferiorly approximately 2 cm.  The inner cyst wall and the skin was then grasped circumferentially with curved hemostats.  The inner cyst wall and skin was then marsupialized with a continuous interlocking suture of 3-0 Monocryl in a routine fashion creating a marsupial pouch within with all aspects of the inner cyst wall approximated to the outer cutaneous skin.  Hemostasis was excellent.  The gland was then irrigated with Betadine solution and the marsupial pouch was packed gently with 0.5 inch iodoform dressing which will be removed in five to seven days.  There was no active bleeding at the conclusion of the procedure.  All instruments were retired. Patient tolerated procedure well and was transported to the  recovery room in satisfactory condition. Dictated by:   Bing Neighbors Clearance Coots, M.D. Attending Physician:  Tammi Sou DD:  07/26/01 TD:  07/26/01 Job: [id number]

## 2011-01-08 NOTE — Group Therapy Note (Signed)
NAME:  PILAR, WESTERGAARD NO.:  [id number]   MEDICAL RECORD NO.:  [id number]                   PATIENT TYPE:  OUT   LOCATION:  [facility]                           FACILITY:  [facility]   PHYSICIAN:  Argentina Donovan, MD                     DATE OF BIRTH:  [birth year]   DATE OF SERVICE:  07/15/2002                                    CLINIC NOTE   CHIEF COMPLAINT:  The patient is a 50 year old gravida 6 para 6-0-0-6 who  has had heavy vaginal bleeding over the past six months.  Is currently been  placed on iron therapy and had been given a shot of Lupron Depot 3.75 on  June 25, 2003.  We are going to have her come back the beginning of  December for another injection and we will schedule her in mid January for a  vaginal hysterectomy, possible abdominal, and hopefully she will be able to  shrink that uterus down to a reasonable size that at the present time is  approximately [redacted] weeks gestational size.  The patient is a smoker, about one  pack a day.  She does not drink or take illicit drugs and has no other  significant medical history.  She has always been in good health although  she is positive for sickle cell trait.   IMPRESSION:  1. Symptomatic fibroids.  2. Menorrhagia.  3. Anemia.   PLAN:  Vaginal hysterectomy, possible abdominal.                                               Argentina Donovan, MD    PR/MEDQ  D:  07/16/2003  T:  07/16/2003  Job:  [id number]

## 2011-01-08 NOTE — Group Therapy Note (Signed)
NAME:  Emily Cameron, Emily Cameron NO.:  [id number]   MEDICAL RECORD NO.:  [id number]          PATIENT TYPE:  WOC   LOCATION:  [facility]                   FACILITY:  [facility]   PHYSICIAN:  Argentina Donovan, MD        DATE OF BIRTH:  Apr 16, 1961   DATE OF SERVICE:  09/03/2004                                    CLINIC NOTE   Patient is a 50 year old black female who underwent total abdominal  hysterectomy one year ago.  Was last seen in June of 2005 with a complaint  of menopausal symptoms which were not confirmed by hormone evaluation.  Since that time those symptoms are pretty well gone.  In July she had an  episode of chest pain, shortness of breath and was seen at the [facility], diagnosed with a pulmonary embolism, etiology unknown and since  then has been on Coumadin.  Her other complaint has been paresthesias, i.e.,  burning of the bilateral feet severe enough that they keep her awake at  night.  No work-up has been done on this.  She sees her internist bi-weekly  so I have told her to make sure he starts working her up to find out why she  has the paresthesias.   PHYSICAL EXAMINATION:  GYNECOLOGIC:  Breasts symmetrical with no dominant  masses.  She is due for an ultrasound _________ mammogram tomorrow.  ABDOMEN:  Soft, flat, nontender.  No masses.  No organomegaly.  GENITALIA:  External genitalia is normal.  BUS within normal limits.  Vagina  is clean, well rugated with a positive whiff test.  Adnexa is normal  bimanual.  RECTAL:  No masses.   IMPRESSION:  1.  Bacterial vaginosis, otherwise normal gynecologic examination.  2.  In addition, history of pulmonary embolism on Coumadin.  3.  Paresthesias bilateral lower extremities.   Prescription Flagyl 500 #14 one b.i.d. x1 week.      PR/MEDQ  D:  09/03/2004  T:  09/03/2004  Job:  [id number]

## 2011-01-13 ENCOUNTER — Encounter: Payer: Self-pay | Admitting: Internal Medicine

## 2011-01-13 DIAGNOSIS — F1010 Alcohol abuse, uncomplicated: Secondary | ICD-10-CM | POA: Insufficient documentation

## 2011-01-13 DIAGNOSIS — G4700 Insomnia, unspecified: Secondary | ICD-10-CM | POA: Insufficient documentation

## 2011-01-26 ENCOUNTER — Other Ambulatory Visit (HOSPITAL_COMMUNITY)
Admission: RE | Admit: 2011-01-26 | Discharge: 2011-01-26 | Disposition: A | Discharge status: Home / Self Care | Payer: Medicaid Other | Source: Ambulatory Visit | Attending: Internal Medicine | Admitting: Internal Medicine

## 2011-01-26 ENCOUNTER — Encounter: Payer: Self-pay | Admitting: Internal Medicine

## 2011-01-26 ENCOUNTER — Ambulatory Visit (INDEPENDENT_AMBULATORY_CARE_PROVIDER_SITE_OTHER): Payer: Medicaid Other | Admitting: Internal Medicine

## 2011-01-26 DIAGNOSIS — Z01419 Encounter for gynecological examination (general) (routine) without abnormal findings: Secondary | ICD-10-CM | POA: Insufficient documentation

## 2011-01-26 DIAGNOSIS — R630 Anorexia: Secondary | ICD-10-CM

## 2011-01-26 DIAGNOSIS — R05 Cough: Secondary | ICD-10-CM

## 2011-01-26 DIAGNOSIS — R21 Rash and other nonspecific skin eruption: Secondary | ICD-10-CM

## 2011-01-26 DIAGNOSIS — G4700 Insomnia, unspecified: Secondary | ICD-10-CM

## 2011-01-26 DIAGNOSIS — E785 Hyperlipidemia, unspecified: Secondary | ICD-10-CM

## 2011-01-26 DIAGNOSIS — Z299 Encounter for prophylactic measures, unspecified: Secondary | ICD-10-CM | POA: Insufficient documentation

## 2011-01-26 DIAGNOSIS — N898 Other specified noninflammatory disorders of vagina: Secondary | ICD-10-CM

## 2011-01-26 MED ORDER — MEGESTROL ACETATE 625 MG/5ML PO SUSP: 625.0000 mg | Freq: Every day | ORAL | Status: DC

## 2011-01-26 MED ORDER — DIPHENHYDRAMINE HCL 12.5 MG PO TBDP: 12.5000 mg | ORAL_TABLET | Freq: Four times a day (QID) | ORAL | Status: DC | PRN

## 2011-01-26 MED ORDER — PRAVASTATIN SODIUM 20 MG PO TABS: 20.0000 mg | ORAL_TABLET | Freq: Every day | ORAL | Status: DC

## 2011-01-26 MED ORDER — ALPRAZOLAM 0.25 MG PO TABS: 0.2500 mg | ORAL_TABLET | Freq: Every evening | ORAL | Status: DC | PRN

## 2011-01-26 MED ORDER — ZOLPIDEM TARTRATE 10 MG PO TABS: 10.0000 mg | ORAL_TABLET | Freq: Every evening | ORAL | Status: DC | PRN

## 2011-01-26 MED ORDER — FLUCONAZOLE 150 MG PO TABS: ORAL_TABLET | ORAL | Status: DC

## 2011-01-26 MED ORDER — BENZONATATE 100 MG PO CAPS: 100.0000 mg | ORAL_CAPSULE | Freq: Three times a day (TID) | ORAL | Status: DC | PRN

## 2011-01-26 NOTE — Assessment & Plan Note (Signed)
Most likely due to yeast infection. The pelvic exam was performed which was notable for whitish discharge. No lesions were noted on the lab he put the cervix area. No erythema noted. Patient was prescribed Diflucan. A wet prep and GC and Chlamydia was sent. Awaiting results for possible changes in management. The patient is at high risk due to sexual activity with a new boyfriend without any protection during intercourse. She is also at high-risk for HIV.

## 2011-01-26 NOTE — Assessment & Plan Note (Signed)
Unclear etiology at this point considering that the patient has been using new medication, new detergent , creams and lotions. At this point I recommended  Only symptomatic therapy with Benadryl. Patient was advised to call the clinic if the rash worsen.

## 2011-01-26 NOTE — Assessment & Plan Note (Signed)
Patient had a partial hysterectomy in 2000. The recommendation included at that time to have a Pap smear within 10 years. We don't have any Pap smear records. The patient has increased risk due to sexual activity.

## 2011-01-26 NOTE — Assessment & Plan Note (Addendum)
Patient a long history of anorexia. TSH was within normal limits. Patient has a long history of depression which may be contributing to her decreased appetite. Furthermore increased for risk for HIV. Per records her ex-husband had HIV. I did not have enough time to discuss this topic during this office visit . She noted she had occasionally some troubles with swallowing which has been present since 5 years. She was never evaluated by GI per patient.  I will have her come back in 2-3 weeks. I will check CBC, complete metabolic panel and HIV. I continued her on Megace at this point but would like to discontinue it as soon as possible after discussing more in detail about patient's history of weight loss and receiving the results of blood work.

## 2011-01-26 NOTE — Progress Notes (Signed)
  Subjective:    Patient ID: Emily Cameron, female    DOB: 1961-07-08, 50 y.o.   MRN: [id number]  HPI This is a 50 year old female with possibly history significant for hyperlipidemia, depression, anorexia and recurrent vaginal discharge who presented to the outpatient clinic with a rash, vaginal discharge and medication refill. 1. Rash: Started 4 days ago. She first noted it on her arms  and then it spread on her face and all over her body. It is very itchy. She reports that she took an antibiotic therapy for  UTI one month ago but does not recall which medication she took. She further used her daughter's medication for vaginal itching and soar  throat but again cannot recall what kind of drugs she used. She mentioned using new detergent and lotions. 2. Vaginal discharge started 3 weeks ago after completing antibiotic therapy for UTI. She tried to use her daughter's medication but it did not improve her symptoms. She is sexually active with a new boyfriend since six-month.  She does not use any condoms. She had a partial hysterectomy in 2000 . She had history of trichomonas in the past. She denies any pain with sexual intercourse. Denies any burning sensation with urination. Denies any abdominal pain, fevers chills nausea vomiting.   Review of Systems As per history of present illness otherwise denies any fever such as, chest pain or shortness of breath, abdominal pain, nausea or vomiting, diarrhea or urinary changes. Denies any joint pain.    Objective:   Physical Exam  Constitutional: Vital signs are normal. She appears well-developed.  HENT:  Head: Normocephalic.  Neck: Neck supple.  Cardiovascular: Normal rate, regular rhythm and normal heart sounds.   Pulmonary/Chest: Effort normal and breath sounds normal.  Abdominal: Soft. She exhibits no distension. There is no tenderness.  Genitourinary: There is no rash or lesion on the right labia. There is no rash or lesion on the left labia. Cervix  exhibits discharge. Cervix exhibits no friability. No erythema, tenderness or bleeding around the vagina. Vaginal discharge found.  Skin: Skin is warm and dry. Rash noted. Rash is macular.          Assessment & Plan:

## 2011-01-27 ENCOUNTER — Other Ambulatory Visit: Payer: Self-pay | Admitting: Internal Medicine

## 2011-01-27 ENCOUNTER — Telehealth: Payer: Self-pay | Admitting: Internal Medicine

## 2011-01-27 DIAGNOSIS — N898 Other specified noninflammatory disorders of vagina: Secondary | ICD-10-CM

## 2011-01-27 LAB — GC/CHLAMYDIA PROBE AMP, GENITAL
Chlamydia, DNA Probe: NEGATIVE
GC Probe Amp, Genital: NEGATIVE

## 2011-01-27 LAB — WET PREP BY MOLECULAR PROBE
Gardnerella vaginalis: POSITIVE — AB
Trichomonas vaginosis: NEGATIVE

## 2011-01-27 MED ORDER — METRONIDAZOLE 500 MG PO TABS: 500.0000 mg | ORAL_TABLET | Freq: Two times a day (BID) | ORAL | Status: AC

## 2011-01-27 NOTE — Assessment & Plan Note (Addendum)
Patient was fount to have Bacterial Vaginosis . Patient was informed about the results and was started on Flagyl for 7 days.

## 2011-01-27 NOTE — Telephone Encounter (Signed)
Patient was fount to have Bacterial Vaginosis . Patient was informed about the results and was started on Flagyl for 7 days.                                    

## 2011-01-29 ENCOUNTER — Other Ambulatory Visit: Payer: Self-pay | Admitting: *Deleted

## 2011-01-29 DIAGNOSIS — G4700 Insomnia, unspecified: Secondary | ICD-10-CM

## 2011-01-29 NOTE — Telephone Encounter (Signed)
Opened by mistake.

## 2011-02-10 ENCOUNTER — Other Ambulatory Visit: Payer: Medicaid Other

## 2011-02-10 ENCOUNTER — Other Ambulatory Visit: Payer: Self-pay | Admitting: *Deleted

## 2011-02-10 DIAGNOSIS — N898 Other specified noninflammatory disorders of vagina: Secondary | ICD-10-CM

## 2011-02-10 NOTE — Telephone Encounter (Signed)
Patient does not need Diflucan anymore.

## 2011-02-15 ENCOUNTER — Other Ambulatory Visit: Payer: Self-pay | Admitting: *Deleted

## 2011-02-15 DIAGNOSIS — R630 Anorexia: Secondary | ICD-10-CM

## 2011-02-16 NOTE — Telephone Encounter (Signed)
Patient has an appointment with me on 6/27. I will discuss with her the need of Megace.

## 2011-02-16 NOTE — Telephone Encounter (Signed)
The patient has an appointment with me on 6/27. I will discuss with her the need of the Megace.

## 2011-02-17 ENCOUNTER — Other Ambulatory Visit: Payer: Self-pay | Admitting: *Deleted

## 2011-02-17 ENCOUNTER — Ambulatory Visit (INDEPENDENT_AMBULATORY_CARE_PROVIDER_SITE_OTHER): Payer: Medicaid Other | Admitting: Internal Medicine

## 2011-02-17 ENCOUNTER — Encounter: Payer: Self-pay | Admitting: Internal Medicine

## 2011-02-17 VITALS — BP 105/63 | HR 69 | Temp 97.6°F | Ht 63.0 in | Wt 123.5 lb

## 2011-02-17 DIAGNOSIS — F3289 Other specified depressive episodes: Secondary | ICD-10-CM

## 2011-02-17 DIAGNOSIS — R634 Abnormal weight loss: Secondary | ICD-10-CM

## 2011-02-17 DIAGNOSIS — R21 Rash and other nonspecific skin eruption: Secondary | ICD-10-CM

## 2011-02-17 DIAGNOSIS — R630 Anorexia: Secondary | ICD-10-CM

## 2011-02-17 DIAGNOSIS — F329 Major depressive disorder, single episode, unspecified: Secondary | ICD-10-CM

## 2011-02-17 DIAGNOSIS — E785 Hyperlipidemia, unspecified: Secondary | ICD-10-CM

## 2011-02-17 DIAGNOSIS — N898 Other specified noninflammatory disorders of vagina: Secondary | ICD-10-CM

## 2011-02-17 LAB — CBC
HCT: 41.8 % (ref 36.0–46.0)
Hemoglobin: 14.3 g/dL (ref 12.0–15.0)
MCH: 30.8 pg (ref 26.0–34.0)
MCHC: 34.2 g/dL (ref 30.0–36.0)
MCV: 89.9 fL (ref 78.0–100.0)
RBC: 4.65 MIL/uL (ref 3.87–5.11)

## 2011-02-17 LAB — LIPID PANEL
Cholesterol: 199 mg/dL (ref 0–200)
Triglycerides: 85 mg/dL (ref <150)

## 2011-02-17 LAB — COMPREHENSIVE METABOLIC PANEL
BUN: 10 mg/dL (ref 6–23)
CO2: 26 mEq/L (ref 19–32)
Creat: 0.86 mg/dL (ref 0.50–1.10)
Glucose, Bld: 74 mg/dL (ref 70–99)
Sodium: 140 mEq/L (ref 135–145)
Total Bilirubin: 0.4 mg/dL (ref 0.3–1.2)
Total Protein: 7.1 g/dL (ref 6.0–8.3)

## 2011-02-17 LAB — TSH: TSH: 0.923 u[IU]/mL (ref 0.350–4.500)

## 2011-02-17 MED ORDER — CALCIUM CITRATE-VITAMIN D 315-200 MG-UNIT PO TABS: 1.0000 | ORAL_TABLET | Freq: Every day | ORAL | Status: DC

## 2011-02-17 MED ORDER — MEGESTROL ACETATE 400 MG/10ML PO SUSP: 400.0000 mg | Freq: Every day | ORAL | Status: DC

## 2011-02-17 NOTE — Telephone Encounter (Signed)
Pt called asking for refill on Megace.  Her insurance will not cover the above Rx of megace ES. Pharmacy called and they will cover megace 40mg /ml Pt has appointment today but wanted to get refill early.

## 2011-02-17 NOTE — Telephone Encounter (Signed)
Pt seen in clinic today.  

## 2011-02-21 NOTE — Assessment & Plan Note (Signed)
Trichomonas infection and was treated with Flagyl. Symptoms improved.

## 2011-02-21 NOTE — Assessment & Plan Note (Signed)
Patient did not get lab work at the last office visit since she was afraid that she would be tested for HIV which is included in the differential diagnosis of weight loss. She has been refusing it in the past. Discussed with Dr. Phillips Odor informed  the  patient the importance to be checked for HIV. Patient noted that there is therapy available to treat HIV and this is not a death sentence which the patient is worried of. She would be  Agreeable if her boyfriend would ask for it. She further needs some more time to think about it but at this point she does not want to be tested for HIV. Will proved Megace for now only. Patient was informed if no real indication is noted then Megace will be discontinued.

## 2011-02-21 NOTE — Assessment & Plan Note (Signed)
Patient did not get lab work at the last office visit since she was afraid that she would be tested for HIV which is included in the differential diagnosis of weight loss. She has been refusing it in the past. Discussed with Dr. Phillips Odor informed  the  patient the importance to be checked for HIV. Patient noted that there is therapy available to treat HIV and this is not a death sentence which the patient is worried of. She would be  Agreeable if her boyfriend would ask for it. She further needs some more time to think about it but at this point she does not want to be tested for HIV.

## 2011-02-21 NOTE — Assessment & Plan Note (Signed)
Rash resolved without further intervention.

## 2011-02-21 NOTE — Assessment & Plan Note (Signed)
Patient noted that she has episode when the mood is down but does not want to speak with a Counselor or Psychiatrist.  Will readdress at the next office visit.

## 2011-02-21 NOTE — Assessment & Plan Note (Signed)
Will check Lipid panel today.

## 2011-02-21 NOTE — Progress Notes (Signed)
  Subjective:    Patient ID: Emily Cameron, female    DOB: [birth year], 50 y.o.   MRN: [id number]  HPI  This is a 50 year old female with possible medical history as noted above who is here for regular  followup from  last office visit. At that time patient was seen for the first time after a long time. She was complaining about decreased appetite and significant weight loss. Patient refused to get blood drawn since she does not want to be tested for HIV. She is to afraid to know that she needs to die. She is happy with her life right now and happy with her new current boy friend. She noted whenever her Depression is not well controlled she looses her appetite more. She definitely would like to have Megace prescribed today.   Review of Systems  Constitutional: Positive for appetite change and unexpected weight change. Negative for chills and fatigue.  HENT: Negative for neck pain.   Eyes: Negative for visual disturbance.  Respiratory: Negative for cough, chest tightness and shortness of breath.   Cardiovascular: Negative for chest pain and palpitations.  Genitourinary: Positive for vaginal discharge. Negative for vaginal bleeding, difficulty urinating and vaginal pain.  Musculoskeletal: Negative for arthralgias.  Neurological: Negative for dizziness, weakness and light-headedness.       Objective:   Physical Exam  Constitutional: She appears well-developed.  HENT:  Head: Normocephalic.  Mouth/Throat: Normal dentition.  Neck: Neck supple.  Cardiovascular: Normal rate, regular rhythm and normal heart sounds.   Pulmonary/Chest: Effort normal and breath sounds normal.  Abdominal: Soft. Bowel sounds are normal.  Musculoskeletal: Normal range of motion.  Neurological: She is alert.  Skin: No rash noted.          Assessment & Plan:

## 2011-03-01 ENCOUNTER — Encounter: Payer: Self-pay | Admitting: Internal Medicine

## 2011-03-01 ENCOUNTER — Ambulatory Visit (INDEPENDENT_AMBULATORY_CARE_PROVIDER_SITE_OTHER): Payer: Medicaid Other | Admitting: Internal Medicine

## 2011-03-01 ENCOUNTER — Ambulatory Visit (HOSPITAL_COMMUNITY): Payer: Medicaid Other

## 2011-03-01 VITALS — BP 105/66 | HR 84 | Temp 97.9°F | Resp 20 | Ht 64.0 in | Wt 124.7 lb

## 2011-03-01 DIAGNOSIS — G4700 Insomnia, unspecified: Secondary | ICD-10-CM

## 2011-03-01 DIAGNOSIS — Z23 Encounter for immunization: Secondary | ICD-10-CM

## 2011-03-01 DIAGNOSIS — Z299 Encounter for prophylactic measures, unspecified: Secondary | ICD-10-CM

## 2011-03-01 DIAGNOSIS — F329 Major depressive disorder, single episode, unspecified: Secondary | ICD-10-CM

## 2011-03-01 DIAGNOSIS — R634 Abnormal weight loss: Secondary | ICD-10-CM

## 2011-03-01 DIAGNOSIS — R630 Anorexia: Secondary | ICD-10-CM

## 2011-03-01 DIAGNOSIS — J189 Pneumonia, unspecified organism: Secondary | ICD-10-CM

## 2011-03-01 MED ORDER — ALPRAZOLAM 0.25 MG PO TABS: 0.2500 mg | ORAL_TABLET | Freq: Every evening | ORAL | Status: DC | PRN

## 2011-03-01 MED ORDER — ESCITALOPRAM OXALATE 10 MG PO TABS: 10.0000 mg | ORAL_TABLET | Freq: Every day | ORAL | Status: DC

## 2011-03-01 NOTE — Patient Instructions (Addendum)
1. Bone scan appointment: 03/08/2011 at 2:15 pm. Do not take calcium or Vitamin D on Sunday and Monday.  Phone number [phone]. CT chest: 03/09/2011 at 2:15 pm at the Radiology Department at [facility].

## 2011-03-01 NOTE — Progress Notes (Signed)
  Subjective:    Patient ID: Emily Cameron, female    DOB: 1961-05-09, 50 y.o.   MRN: [id number]  HPI This is a 50 year old female with PMH significant for depression, Hyperlipidemia, Weight loss who presented to the clinic for a follow up visit. Patient wanted to think about at the last office if she wanted to be tested for HIV. She discussed it with her boyfriend and family and decided to do so. She noted since she is taking the Megace her appetite is better and her mood is somewhat better. She is keeping herself busy with family. She is taking care of her 3 grand-daughters and is happy with it.Nevertheless she is needing Xanax on a daily basis since especially in the evening she get depressed and occasionally very angry, anxious and needs it to calm down her nerves.  Patient was complaining today that she is having tingling /numbness in the left leg since one month. It is on and off. Denies any trauma,or  weakness.    Review of Systems  Constitutional: Negative for fever, chills, appetite change and fatigue.  HENT: Negative for neck pain.   Respiratory: Negative for chest tightness and shortness of breath.   Cardiovascular: Negative for chest pain, palpitations and leg swelling.  Gastrointestinal: Negative for abdominal pain, constipation and abdominal distention.  Genitourinary: Negative for difficulty urinating.  Musculoskeletal: Negative for back pain and arthralgias.  Neurological: Negative for dizziness and light-headedness.  Psychiatric/Behavioral: Positive for decreased concentration and agitation.       Objective:   Physical Exam  Constitutional: She is oriented to person, place, and time. She appears well-developed.  HENT:  Head: Normocephalic.  Neck: Neck supple.  Cardiovascular: Normal rate, regular rhythm and normal heart sounds.   Pulmonary/Chest: Effort normal and breath sounds normal. No respiratory distress.  Abdominal: Soft. Bowel sounds are normal. She exhibits  distension. There is no tenderness.  Musculoskeletal: Normal range of motion.  Neurological: She is alert and oriented to person, place, and time. She displays normal reflexes. No cranial nerve deficit or sensory deficit. She exhibits normal muscle tone. Coordination normal.  Skin: Skin is warm and dry.  Psychiatric: She has a normal mood and affect.          Assessment & Plan:

## 2011-03-02 ENCOUNTER — Encounter: Payer: Self-pay | Admitting: Internal Medicine

## 2011-03-02 NOTE — Assessment & Plan Note (Signed)
Received Tetanus shot today.

## 2011-03-02 NOTE — Assessment & Plan Note (Signed)
Patient is now exceptable to check HIV today. All other labs including Thyroid function has been within normal limits. Reviewing the chart patient had been evaluated in the ED in 06/2010 for bloody cough and a CT angio was performed which showed a peripheral area of density in the lingula likely represents atelectasis but a follow-up CT was recommended in 3-4 months to definitively exclude mass lesion. I think unlikely any malignant lesion but in the setting of weight loss ( which has been present for years) and long history of smoking will obtain a CT of the chest without contrast for further evaluation.  The likely cause of patient's weight loss is due to decreased appetite in the setting of depression. Will treat Depression and continue to monitor.

## 2011-03-02 NOTE — Assessment & Plan Note (Signed)
Reviewing the chart patient has been prescribed Zoloft, Paxil and Remeron without any success due to non-compliance. Reasoning were that the patient noted to many side effect and therefore did not want to take it. Patient also was asked to see a Psychologist, forensic which she did not. I will start her on Lexapro which will also help with weight gain and will continue with Xanax at this point. Furthermore I will refer the patient to SW to assist with referral to Psychiatry and/or Counseling.

## 2011-03-04 ENCOUNTER — Telehealth: Payer: Self-pay | Admitting: *Deleted

## 2011-03-04 NOTE — Telephone Encounter (Signed)
Will see tomorrow  Pt will go to ED if she gets worse.

## 2011-03-04 NOTE — Telephone Encounter (Signed)
Pt called with c/o pain to left side of chest. Around breast and to back area. Pain is sharp and moves like gas bubble.  Happens every 20 minutes last about 20 seconds. Onset yesterday am but more frequent today. Denies cough but c/o dizziness, and headache. Feels pain more when she breaths. Denies SOB and diaphoresis. No known cause. Good intake.  Pt # [id number]

## 2011-03-04 NOTE — Telephone Encounter (Signed)
Would like for her to be seen for F/U appointment, next available, any provider- She had an old lung abnormality of CXR last year - she needs a repeat CT scan-Dr. Loistine Chance aware if this. If increases in intensity and persists I want her to go to ED but I highly doubt this is cardiac- more likely pulmonary. Needs to be seen.

## 2011-03-05 ENCOUNTER — Ambulatory Visit (INDEPENDENT_AMBULATORY_CARE_PROVIDER_SITE_OTHER): Payer: Medicaid Other | Admitting: Internal Medicine

## 2011-03-05 ENCOUNTER — Ambulatory Visit (HOSPITAL_COMMUNITY)
Admission: RE | Admit: 2011-03-05 | Discharge: 2011-03-05 | Disposition: A | Discharge status: Home / Self Care | Payer: Medicaid Other | Source: Ambulatory Visit | Attending: Internal Medicine | Admitting: Internal Medicine

## 2011-03-05 ENCOUNTER — Encounter: Payer: Self-pay | Admitting: Internal Medicine

## 2011-03-05 ENCOUNTER — Inpatient Hospital Stay (HOSPITAL_COMMUNITY)
Admission: AD | Admit: 2011-03-05 | Discharge: 2011-03-06 | DRG: 176 | Disposition: A | Discharge status: Home / Self Care | Payer: Medicaid Other | Source: Ambulatory Visit | Attending: Internal Medicine | Admitting: Internal Medicine

## 2011-03-05 ENCOUNTER — Encounter: Payer: Self-pay | Admitting: Ophthalmology

## 2011-03-05 ENCOUNTER — Encounter (HOSPITAL_COMMUNITY): Payer: Self-pay

## 2011-03-05 VITALS — BP 106/64 | HR 95 | Temp 98.1°F | Ht 64.0 in | Wt 127.0 lb

## 2011-03-05 DIAGNOSIS — R0602 Shortness of breath: Secondary | ICD-10-CM | POA: Insufficient documentation

## 2011-03-05 DIAGNOSIS — F411 Generalized anxiety disorder: Secondary | ICD-10-CM | POA: Diagnosis present

## 2011-03-05 DIAGNOSIS — R630 Anorexia: Secondary | ICD-10-CM

## 2011-03-05 DIAGNOSIS — R918 Other nonspecific abnormal finding of lung field: Secondary | ICD-10-CM | POA: Insufficient documentation

## 2011-03-05 DIAGNOSIS — Z7901 Long term (current) use of anticoagulants: Secondary | ICD-10-CM

## 2011-03-05 DIAGNOSIS — I2699 Other pulmonary embolism without acute cor pulmonale: Secondary | ICD-10-CM | POA: Insufficient documentation

## 2011-03-05 DIAGNOSIS — R071 Chest pain on breathing: Secondary | ICD-10-CM | POA: Insufficient documentation

## 2011-03-05 DIAGNOSIS — M549 Dorsalgia, unspecified: Secondary | ICD-10-CM | POA: Insufficient documentation

## 2011-03-05 DIAGNOSIS — R911 Solitary pulmonary nodule: Secondary | ICD-10-CM | POA: Insufficient documentation

## 2011-03-05 DIAGNOSIS — F3289 Other specified depressive episodes: Secondary | ICD-10-CM | POA: Diagnosis present

## 2011-03-05 DIAGNOSIS — F17200 Nicotine dependence, unspecified, uncomplicated: Secondary | ICD-10-CM | POA: Diagnosis present

## 2011-03-05 DIAGNOSIS — F329 Major depressive disorder, single episode, unspecified: Secondary | ICD-10-CM | POA: Diagnosis present

## 2011-03-05 DIAGNOSIS — K5900 Constipation, unspecified: Secondary | ICD-10-CM | POA: Diagnosis present

## 2011-03-05 HISTORY — DX: Other pulmonary embolism without acute cor pulmonale: I26.99

## 2011-03-05 LAB — CARDIAC PANEL(CRET KIN+CKTOT+MB+TROPI): CK, MB: 1 ng/mL (ref 0.3–4.0)

## 2011-03-05 LAB — CBC
HCT: 37.6 % (ref 36.0–46.0)
Hemoglobin: 13.7 g/dL (ref 12.0–15.0)
MCH: 32.4 pg (ref 26.0–34.0)
MCH: 31.4 pg (ref 26.0–34.0)
MCHC: 36.4 g/dL — ABNORMAL HIGH (ref 30.0–36.0)
MCV: 88.9 fL (ref 78.0–100.0)
MCV: 88.3 fL (ref 78.0–100.0)
Platelets: 218 10*3/uL (ref 150–400)
RBC: 4.2 MIL/uL (ref 3.87–5.11)
RDW: 15.2 % (ref 11.5–15.5)
RDW: 15.1 % (ref 11.5–15.5)
WBC: 10 10*3/uL (ref 4.0–10.5)

## 2011-03-05 LAB — COMPREHENSIVE METABOLIC PANEL
Albumin: 3.6 g/dL (ref 3.5–5.2)
Alkaline Phosphatase: 63 U/L (ref 39–117)
BUN: 11 mg/dL (ref 6–23)
CO2: 25 mEq/L (ref 19–32)
Chloride: 106 mEq/L (ref 96–112)
Creatinine, Ser: 0.63 mg/dL (ref 0.50–1.10)
GFR calc non Af Amer: >60 mL/min (ref >60)
Potassium: 4.1 mEq/L (ref 3.5–5.1)
Total Bilirubin: 0.1 mg/dL — ABNORMAL LOW (ref 0.3–1.2)

## 2011-03-05 LAB — PROTIME-INR: Prothrombin Time: 12.9 seconds (ref 11.6–15.2)

## 2011-03-05 LAB — ETHANOL: Alcohol, Ethyl (B): <11 mg/dL (ref 0–11)

## 2011-03-05 IMAGING — CT CT ANGIO CHEST
2 of 7 series · 18 of 36 positions shown · IV contrast (CONTRAST)
Comparison: 07/08/2010

CLINICAL DATA: Chest pain with inspiration, shortness of breath,
left side back pain, history of pulmonary embolism

CT ANGIOGRAPHY CHEST WITH CONTRAST
TECHNIQUE: Multidetector CT imaging of the chest was performed
using the standard protocol during bolus administration of
intravenous contrast.  Multiplanar CT image reconstructions
including MIPs were obtained to evaluate the vascular anatomy.
Contrast:  80 ml Omnipaque 300 IV.

[Series 5: pe thins · axial · 0.62mm/px · z∈[-249,-24]mm · 17 of 255 slices shown]
[im 15/255  lung]
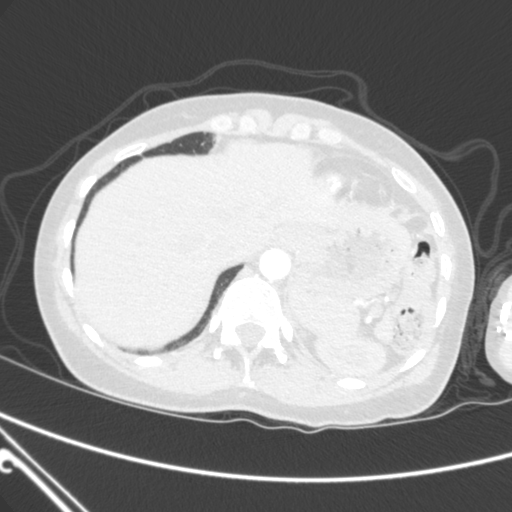
[im 29/255  mediastinal]
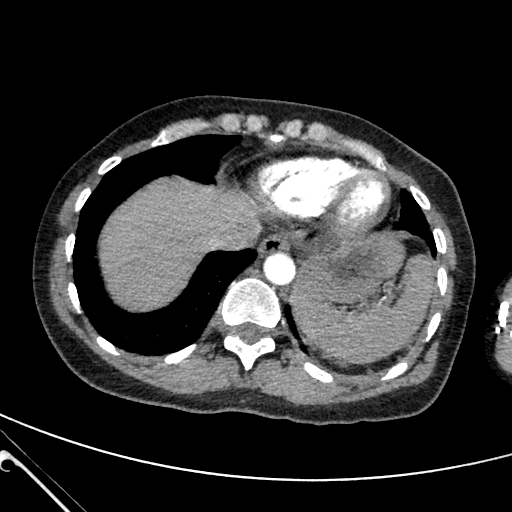
[im 43/255  lung]
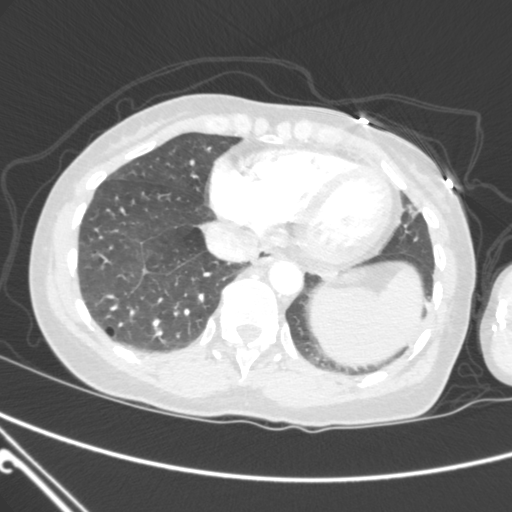
[im 57/255  mediastinal]
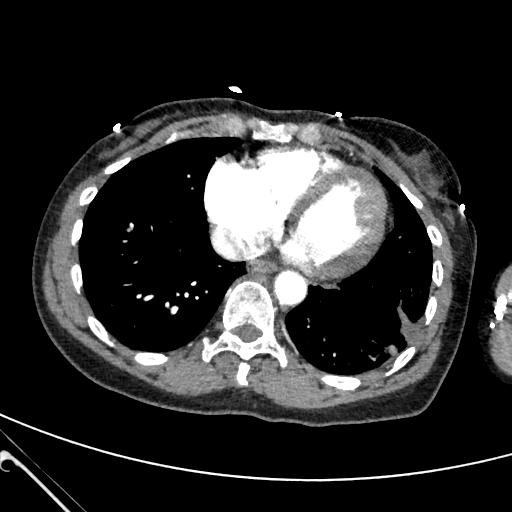
[im 71/255  lung]
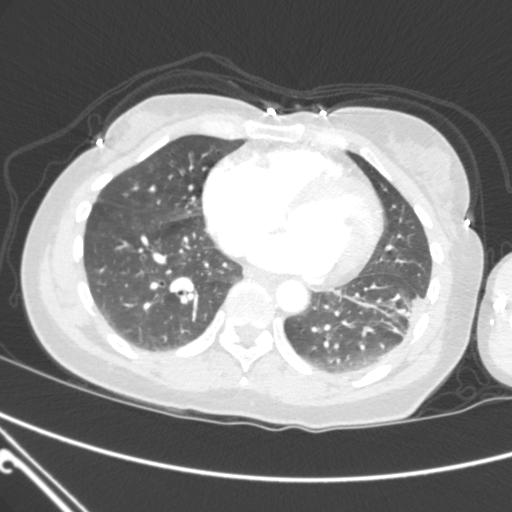
[im 85/255  mediastinal]
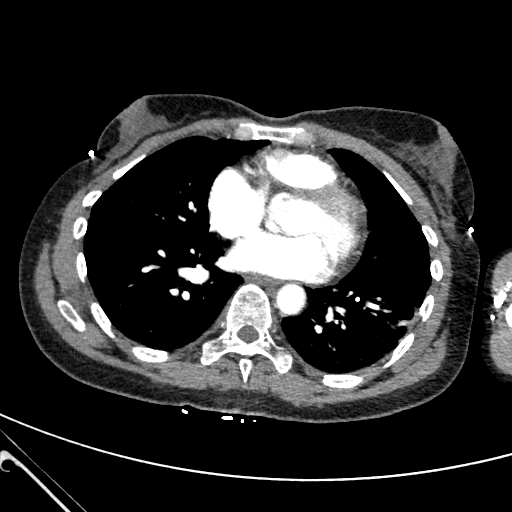
[im 99/255  lung]
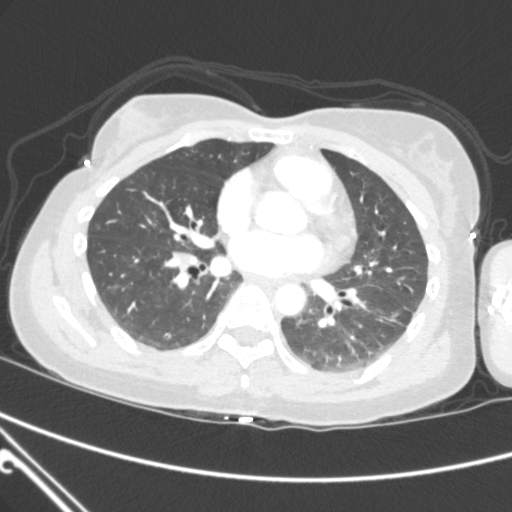
[im 113/255  mediastinal]
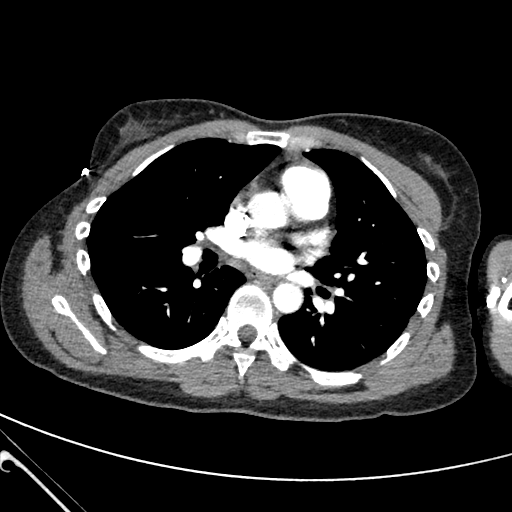
[im 128/255  lung]
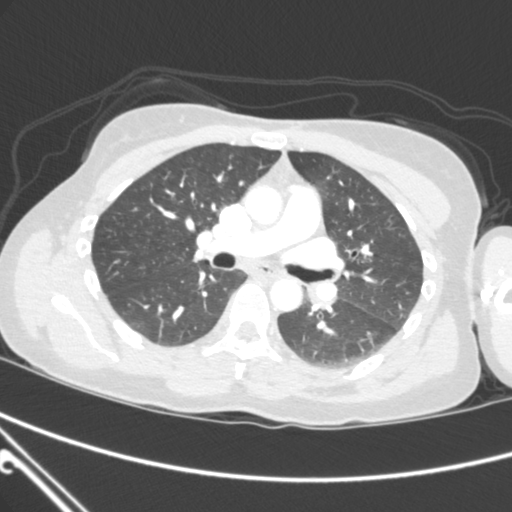
[im 142/255  mediastinal]
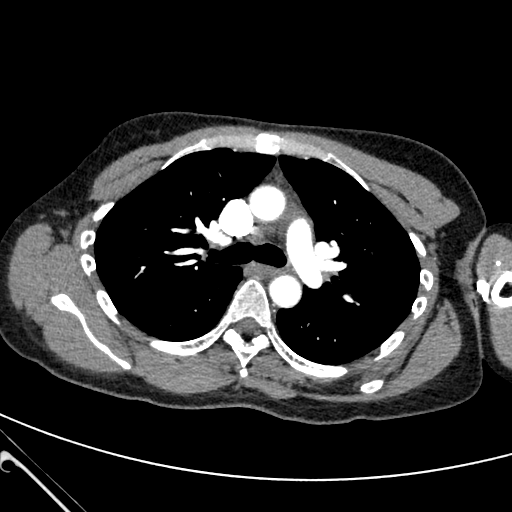
[im 156/255  lung]
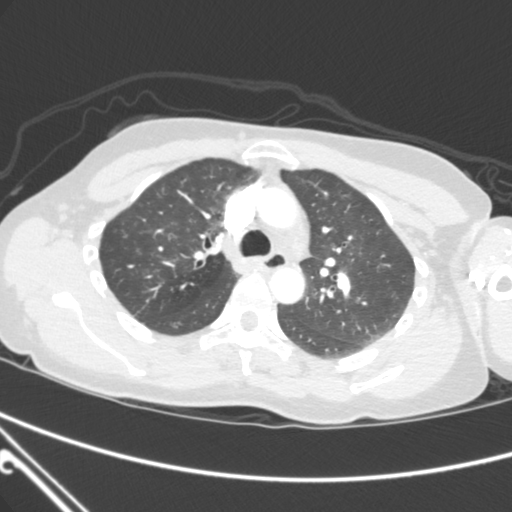
[im 170/255  mediastinal]
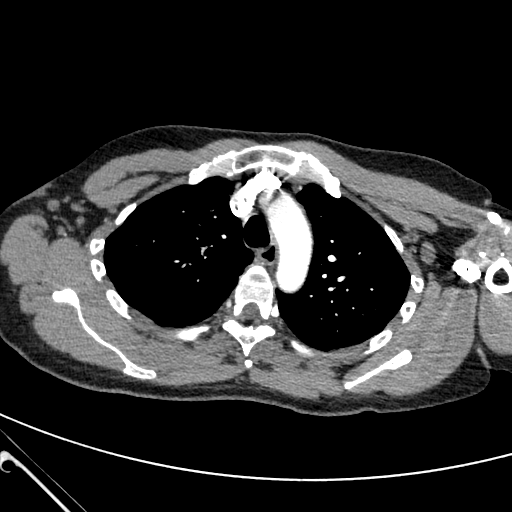
[im 184/255  lung]
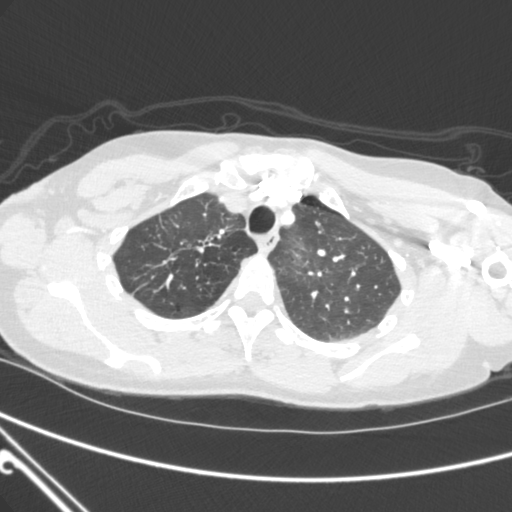
[im 198/255  mediastinal]
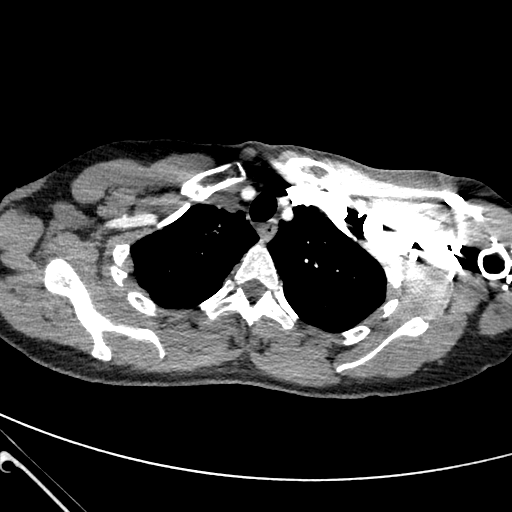
[im 212/255  lung]
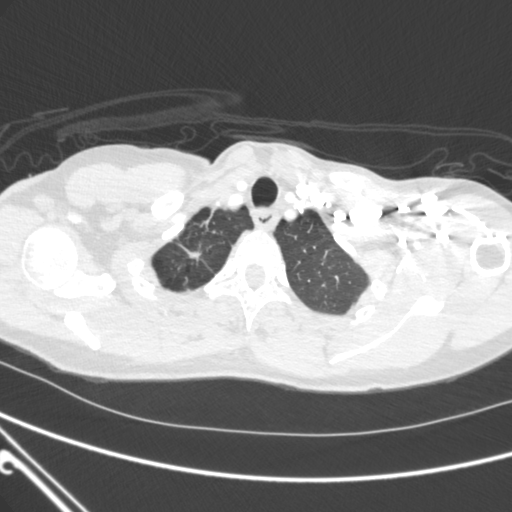
[im 226/255  mediastinal]
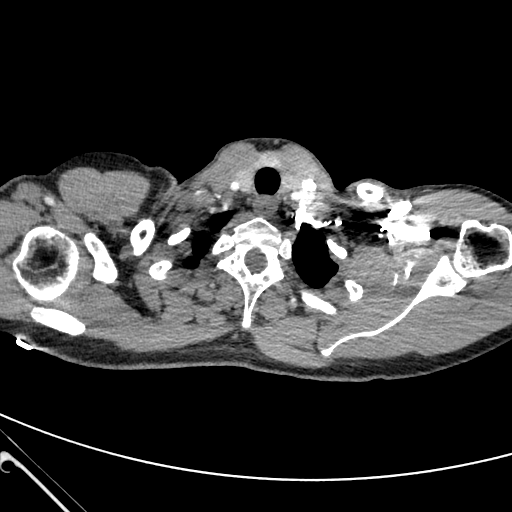
[im 240/255  lung]
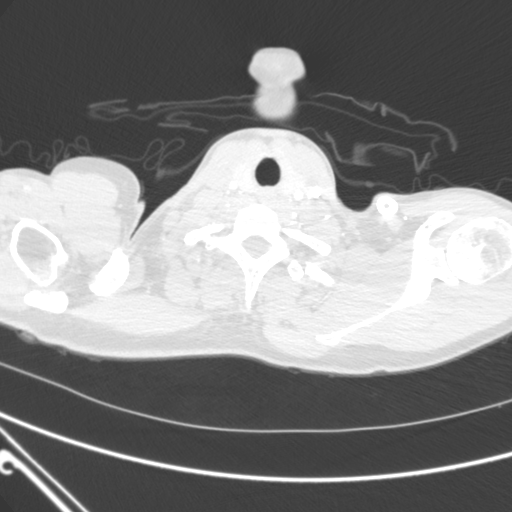

[coronal · coronal · 0.62mm/px · 1 of 94 slices shown]
[im 47/94  mediastinal]
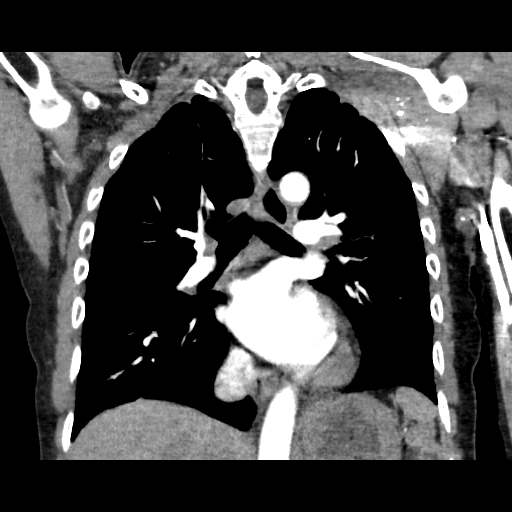

[18 of 36 positions shown; findings below may reference images not displayed]

FINDINGS: Aorta normal caliber without aneurysm or dissection.
Single tiny filling defect identified within the right lower lobe
pulmonary artery compatible with pulmonary embolus.
Remaining pulmonary arteries appear patent.
Scattered normal-sized axillary, mediastinal and hilar lymph nodes.
Visualized portion of upper abdomen unremarkable.
Somewhat spiculated nodular density at right apex measures 9 x 6 mm
image 24, previously 8 x 5 mm.
Scattered emphysematous changes and peribronchial thickening.
Dependent atelectasis in both lungs greater on left.
No pulmonary infiltrate, pleural effusion or pneumothorax.
No acute osseous findings.

Review of the MIP images confirms the above findings.
IMPRESSION: Single tiny filling defect identified within a right lower lobe
pulmonary artery.
No definite filling defect is seen within this vessel on the
previous exam.
Finding is compatible with a small acute pulmonary embolus.
Atelectasis particularly left lower lobe.
Emphysematous changes with right apical scarring, including a
somewhat stellate nodular area of increased density at the right
apex; recommend follow-up CT chest imaging of this lesion in 4-6
months to insure stability and exclude scar carcinoma.

Critical Value/emergent results were called by telephone at the
time of interpretation on 03/05/2011 at 1434 hrs to Dr. Kiho, who
verbally acknowledged these results.

## 2011-03-05 MED ORDER — MEGESTROL ACETATE 400 MG/10ML PO SUSP: 400.0000 mg | Freq: Every day | ORAL | Status: DC

## 2011-03-05 MED ORDER — MIRTAZAPINE 15 MG PO TABS: 15.0000 mg | ORAL_TABLET | Freq: Every day | ORAL | Status: DC

## 2011-03-05 MED ORDER — IOHEXOL 300 MG/ML  SOLN
80.0000 mL | Freq: Once | INTRAMUSCULAR | Status: AC | PRN
  Administered 2011-03-05: 80 mL via INTRAVENOUS

## 2011-03-05 NOTE — H&P (Signed)
Hospital Admission Note Date: 03/05/2011  Patient name: Emily Cameron Medical record number: [id number] Date of birth: 1960-09-16 Age: 50 y.o. Gender: female PCP: Almyra Deforest, MD  Medical Service: Internal Medicine Teaching Service  Attending physician:  Dr. Aundria Rud Resident (R2/R3): Dr. Denton Meek    Pager: [phone] Resident (R1): Dr. Dorise Hiss     Pager: [phone]  Chief Complaint: pleuritic left chest pain  History of Present Illness: Emily Cameron is a 50 yo woman with a past medical history of pulmonary embolus in July 2005 who presented to clinic today with pleuritic left chest/subcostal pain that radiates to her back, as well as dyspnea and lightheadedness which all began 3-4 days ago. She also reports a frontal headache. She denies fever, productive cough, urinary symptoms or diarrhea. She has a recent history of a URI about 2 weeks ago that was productive of green phlegm. Of note, she has been on Megace for anorexia for several years and she has been unwilling to discontinue it despite her history of PE and current smoking status. She was admitted directly to the floor after obtaining a chest CTA which showed evidence of a small pulmonary embolus.  Medication Sig  . ALPRAZolam (XANAX) 0.25 MG tablet Take 1 tablet (0.25 mg total) by mouth at bedtime as needed. Take on tablet at bedtime for sleep  . benzonatate (TESSALON) 100 MG capsule Take 1 capsule (100 mg total) by mouth 3 (three) times daily as needed. For cough  . calcium citrate-vitamin D (CITRACAL+D) 315-200 MG-UNIT per tablet Take 1 tablet by mouth daily.  . DiphenhydrAMINE HCl 12.5 MG TBDP Take 12.5 mg by mouth every 6 (six) hours as needed (itching).  . megestrol (MEGACE) 400 MG/10ML suspension Take 10 mLs (400 mg total) by mouth daily. Will taper off half weekly x 3 weeks then stop completely.  . mirtazapine (REMERON) 15 MG tablet Take 1 tablet (15 mg total) by mouth at bedtime.  . pravastatin (PRAVACHOL) 20 MG tablet Take 1 tablet  (20 mg total) by mouth daily. To decrease cholesterol  . zolpidem (AMBIEN) 10 MG tablet Take 1 tablet (10 mg total) by mouth at bedtime as needed.   Allergies: Review of patient's allergies indicates no known allergies.  Past Medical History  Diagnosis Date  . Anxiety   . Depression   . Insomnia   . Weight loss     due to anxiety  . PE (pulmonary embolism) 2006  . Alcohol abuse   . Polysubstance abuse     Current smoking and alcohol. 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 Marijuna not taking since 7 month.    Past Surgical History  Procedure Date  . Abdominal hysterectomy    Family History  Problem Relation Age of Onset  . Coronary artery disease Sister   . Heart disease Mother   . Hyperlipidemia Mother   . Hypertension Mother   . Aneurysm Brother 43  . Aneurysm Paternal Aunt 52    Died   . Heart disease Maternal Grandmother   . Pulmonary embolism Brother    History   Social History  . Marital Status: Single    Spouse Name: N/A    Number of Children: N/A  . Years of Education: N/A   Occupational History  . Psychiatric nurse   . housekeeping    Social History Main Topics  . Smoking status: Current Everyday Smoker -- 0.5 packs/day for 31 years    Types: Cigarettes  . Smokeless tobacco: Not on file  Comment: not ready to discuss stopping smoking now.  Cutting back on her own.  . Alcohol Use: Yes     2-3 beers a week  . Drug Use: No  . Sexually Active: Yes   Other Topics Concern  . Not on file   Social History Narrative   Single, does not exercise regularly   Review of Systems: ROS otherwise negative except as noted in HPI.  Physical Exam: Vitals: T: 98.8   HR: 68   RR: 20   BP: 105/68   O2 sat: 95% RA General: pleasant slim woman sitting up in bed, no apparent distress, cooperative with exam Cardiac/Chest: RRR, no r/m/g. Patient tender to palpation of sternum as well as inferolaterally around her left breast Pulm: soft crackles heard in  left lower lung field, moving good air volume Abd: soft, nondistended, BS present. Mildly tender in epigastric area. Ext: no pedal edema, no lesions or skin changes Neuro: alert and oriented X3, cranial nerves II-XII intact  Lab results: Admission on 03/05/2011  Component Date Value Range Status  . WBC (K/uL) 03/05/2011 10.7* 4.0-10.5 Final  . RBC (MIL/uL) 03/05/2011 4.23  3.87-5.11 Final  . Hemoglobin (g/dL) 60/45/4098 11.9  14.7-82.9 Final  . HCT (%) 03/05/2011 37.6  36.0-46.0 Final  . MCV (fL) 03/05/2011 88.9  78.0-100.0 Final  . MCH (pg) 03/05/2011 32.4  26.0-34.0 Final  . MCHC (g/dL) 56/21/3086 57.8* 46.9-62.9 Final  . RDW (%) 03/05/2011 15.2  11.5-15.5 Final  . Platelets (K/uL) 03/05/2011 232  150-400 Final  . AntiThromb III Func (%) 03/05/2011 108  75-120 Final  . Prothrombin Time (seconds) 03/05/2011 12.9  11.6-15.2 Final  . INR  03/05/2011 0.95  0.00-1.49 Final  . Sodium (mEq/L) 03/05/2011 139  135-145 Final  . Potassium (mEq/L) 03/05/2011 4.1  3.5-5.1 Final  . Chloride (mEq/L) 03/05/2011 106  96-112 Final  . CO2 (mEq/L) 03/05/2011 25  19-32 Final  . Glucose, Bld (mg/dL) 52/84/1324 88  40-10 Final  . BUN (mg/dL) 27/25/3664 11  4-03 Final  . Creatinine, Ser (mg/dL) 47/42/5956 3.87  5.64-3.32 Final     . Calcium (mg/dL) 95/18/8416 9.3  6.0-63.0 Final  . Total Protein (g/dL) 16/08/930 7.3  3.5-5.7 Final  . Albumin (g/dL) 32/20/2542 3.6  7.0-6.2 Final  . AST (U/L) 03/05/2011 13  0-37 Final  . ALT (U/L) 03/05/2011 13  0-35 Final  . Alkaline Phosphatase (U/L) 03/05/2011 63  39-117 Final  . Total Bilirubin (mg/dL) 37/62/8315 0.1* 1.7-6.1 Final  . GFR calc non Af Amer (mL/min) 03/05/2011 >60  >60 Final  . GFR calc Af Amer (mL/min) 03/05/2011 >60  >60 Final   Imaging results:  CT ANGIOGRAPHY CHEST WITH CONTRAST  Technique: Multidetector CT imaging of the chest was performed  using the standard protocol during bolus administration of  intravenous contrast. Multiplanar CT  image reconstructions  including MIPs were obtained to evaluate the vascular anatomy.  Contrast: 80 ml Omnipaque 300 IV.  Comparison: 07/08/2010  Findings:  Aorta normal caliber without aneurysm or dissection.  Single tiny filling defect identified within the right lower lobe  pulmonary artery compatible with pulmonary embolus.  Remaining pulmonary arteries appear patent.  Scattered normal-sized axillary, mediastinal and hilar lymph nodes.  Visualized portion of upper abdomen unremarkable.  Somewhat spiculated nodular density at right apex measures 9 x 6 mm  image 24, previously 8 x 5 mm.  Scattered emphysematous changes and peribronchial thickening.  Dependent atelectasis in both lungs greater on left.  No pulmonary infiltrate, pleural effusion or  pneumothorax.  No acute osseous findings.  Review of the MIP images confirms the above findings.  IMPRESSION:  Single tiny filling defect identified within a right lower lobe  pulmonary artery.  No definite filling defect is seen within this vessel on the  previous exam.  Finding is compatible with a small acute pulmonary embolus.  Atelectasis particularly left lower lobe.  Emphysematous changes with right apical scarring, including a  somewhat stellate nodular area of increased density at the right  apex; recommend follow-up CT chest imaging of this lesion in 4-6  months to insure stability and exclude scar carcinoma.  Critical Value/emergent results were called by telephone at the  time of interpretation on 03/05/2011 at 1635 hrs to Dr. Phillips Odor, who  verbally acknowledged these results.  Other results: EKG normal sinus rhythm  Assessment & Plan by Problem: 1. Left sided Chest pain: CTA of the chest shows a tiny right lower lobe PE which does not correlate with where her pain is. She does have risk factors for PE with a previous history as well as smoking while using Megace which has similar risk profile as oral birth control pills. CT  also noted emphysema changes, peribronchial thickening, and atelectasis as well. It is noted in the clinic record that she had what sounds like a viral URI a few weeks ago that has since resolved. Other possible differential for the chest pain includes ACS, bronchitis, musculoskeletal chest pain, and anxiety. She has a significant smoking history. She does have pain to palpation of her sternum on exam today but no pain to compression of the lateral ribs. Her blood pressure, pulse and O2 saturation is normal today on admission.  Plan:  - Admit to telemetry - Full dose Lovenox and coumadin per pharmacy - Repeat her EKG in the am - cycle cardiac enzymes q6 x2 - Continue her Lipitor - Tessalon perrles for cough - Xanax for anxiety - Zofran for nausea  2. History of polysubstance abuse: She has a history of polysubstance abuse with cocaine, marijuana, tobacco, and alcohol. She states that she is 15 years clean from crack and 7 months clean from marijuana. She states that she drinks 1-2 beers every other day. She has no history of withdrawal but it has been noted in the chart previously that she has a history of alcohol abuse. We will monitor on CIWA protocol but it is doubtful that she will go into withdrawal. Folate and thiamine will be started as well.  3. Depression/Anxiety: She does appear on chart review to have a significant anxiety and depressive history. We will continue her on her outpatient regimen of Remeron and lexapro with PRN Xanax and Ativan.   4. Constipation: the patient states that she does have a problem with constipation. She has been using Colace as an outpatient. We will give her miralax here in the hospital.   5. Tobacco abuse: She is a current smoker and has since she was 50 years old. She used to smoke between 1-1.5 ppd but has recently cut down to 6-7 cigarettes per day. We will give her a nicotine patch while here in the hospital and have CSW come by and speak with her about  tobacco cessation and offer any assistance she needs since she expressed interest in quitting at her clinic visit today.  6. VTE: Not needed since on full dose Lovenox and coumadin for problem #1.     R2/3______________________________     R1________________________________  ATTENDING: I performed and/or observed a  history and physical examination of the patient. I discussed the case with the residents as noted and reviewed the residents' notes. I agree with the findings and plan--please refer to the attending physician note for more details.  Signature________________________________  Printed Name_____________________________

## 2011-03-05 NOTE — Assessment & Plan Note (Signed)
Stable.  Will stop Lexapro and start Remeron to also help increase her appetite. -Prescribed Remeron 15 mg by mouth each bedtime

## 2011-03-05 NOTE — Progress Notes (Signed)
Report called to Cat on 5500 at 1535PM.

## 2011-03-05 NOTE — Assessment & Plan Note (Addendum)
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 Clinical presentation is consistent with PE.  Patient is not tachycardic or hypoxic clinically.  I explained to her the side effects of Megace and smoking which is extremely dangerous. -Will obtain stat CT angiogram of chest -CTA result:  Single tiny filling defect identified within a right lower lobe   pulmonary artery. Finding is compatible with a small acute pulmonary embolus.   Atelectasis particularly left lower lobe. -Will admit patient to in-patient for further treatment -Instructed patient to taper off Megace since she has been on the medication for a long period of time.  She agrees to stop Megace and will stop smoking as well.  Case discussed with Dr. Phillips Odor.

## 2011-03-05 NOTE — Patient Instructions (Signed)
Please taper your Megace to 200mg  for the first week, then 100mg   for the 2nd week, then 50mg  3rd week, then stop Will get a stat CT angiogram of chest  Will start Remeron 15mg  one tablet at bedtime Follow up in 1-2 weeks

## 2011-03-05 NOTE — Progress Notes (Signed)
History of present illness: Ms. Emily Cameron is a 50 year old woman with past medical history of pulmonary embolus, depression, alcohol abuse, weight loss on Megace time 5 years presents to clinic today for lightheadedness, dizziness, shortness of breath, and chest pain on inspiration. She states that the onset was gradual in the past 3-4 days. She has sharp pain across the upper abdomen which radiates to her left chest and to her back. She has severe sharp as well as nagging chest pain on inspiration. The pain usually lasts 10 seconds, intermittent. It is associated with a frontal headache. She did have a cold about 2 weeks ago with green phlegm but now resolved.  Pain is aggravated by moving around and alleviated by relaxation. She endorses chills feeling subjectively but denies any nausea or vomiting She denies any trauma. Of note, patient has been on Megace for at least 5 years even after her first pulmonary embolus. She states that she needs Megace for appetite. Multiple physicians and residents have try to discontinue Megace because of the adverse affect especially in the setting of her previous pulmonary embolus and continued to smoke; however, patient has refused to be taken off of Megace. Patient also has depression and which she has try multiple SSRIs and could not tolerate because of side effects per patient report. She was prescribed Lexapro during her last office visit; however, patient has not been taking Lexapro because she just picked up her prescription yesterday.  ROS: As per history of present illness  PE:  General: alert, thin appearing, and cooperative to examination.  Lungs: normal respiratory effort, no accessory muscle use, normal breath sounds, no crackles, and no wheezes. Heart: normal rate, regular rhythm, no murmur, no gallop, and no rub.  Abdomen: soft, non-tender, normal bowel sounds, no distention, no guarding, no rebound tenderness  Msk: no joint swelling, no joint warmth, and  no redness over joints.  Pulses: 2+ DP/PT pulses bilaterally Extremities: No cyanosis, clubbing, edema Neurologic: alert & oriented X3, cranial nerves II-XII intact, strength normal in all extremities, sensation intact to light touch, and gait normal.  Skin: turgor normal and no rashes.  Psych: Oriented X3, memory intact for recent and remote, normally interactive, good eye contact, + anxious appearing and depressed apprearing

## 2011-03-06 ENCOUNTER — Other Ambulatory Visit (HOSPITAL_COMMUNITY): Payer: Medicaid Other

## 2011-03-06 LAB — PROTIME-INR
INR: 1.04 (ref 0.00–1.49)
Prothrombin Time: 13.8 seconds (ref 11.6–15.2)

## 2011-03-06 LAB — HOMOCYSTEINE: Homocysteine: 7.1 umol/L (ref 4.0–15.4)

## 2011-03-06 LAB — CBC
HCT: 35 % — ABNORMAL LOW (ref 36.0–46.0)
Hemoglobin: 12.2 g/dL (ref 12.0–15.0)
RBC: 3.97 MIL/uL (ref 3.87–5.11)

## 2011-03-06 LAB — CARDIAC PANEL(CRET KIN+CKTOT+MB+TROPI)
Relative Index: INVALID (ref 0.0–2.5)
Total CK: 54 U/L (ref 7–177)
Troponin I: <0.3 ng/mL (ref <0.30)

## 2011-03-06 NOTE — Discharge Summary (Signed)
Pt admitted for PE. Started on coumadin and lovenox. Will F/U in coumadin clinic and with OPC. Please follow levels and resolution of pain.

## 2011-03-08 ENCOUNTER — Ambulatory Visit (HOSPITAL_COMMUNITY): Payer: Medicaid Other

## 2011-03-08 ENCOUNTER — Other Ambulatory Visit (HOSPITAL_COMMUNITY): Payer: Medicaid Other

## 2011-03-08 LAB — LUPUS ANTICOAGULANT PANEL
Lupus Anticoagulant: NOT DETECTED
PTT Lupus Anticoagulant: 36.6 secs (ref 30.0–45.6)

## 2011-03-08 LAB — PROTEIN S ACTIVITY: Protein S Activity: 91 % (ref 69–129)

## 2011-03-08 LAB — PROTEIN C ACTIVITY: Protein C Activity: 121 % (ref 75–133)

## 2011-03-08 LAB — PROTHROMBIN GENE MUTATION

## 2011-03-09 ENCOUNTER — Other Ambulatory Visit (HOSPITAL_COMMUNITY): Payer: Medicaid Other

## 2011-03-09 ENCOUNTER — Ambulatory Visit (INDEPENDENT_AMBULATORY_CARE_PROVIDER_SITE_OTHER): Payer: Medicaid Other | Admitting: Pharmacist

## 2011-03-09 DIAGNOSIS — I2699 Other pulmonary embolism without acute cor pulmonale: Secondary | ICD-10-CM

## 2011-03-09 DIAGNOSIS — Z5181 Encounter for therapeutic drug level monitoring: Secondary | ICD-10-CM | POA: Insufficient documentation

## 2011-03-09 DIAGNOSIS — Z7901 Long term (current) use of anticoagulants: Secondary | ICD-10-CM

## 2011-03-09 LAB — CARDIOLIPIN ANTIBODIES, IGG, IGM, IGA
Anticardiolipin IgA: 7 APL U/mL — ABNORMAL LOW (ref <22)
Anticardiolipin IgG: 7 GPL U/mL — ABNORMAL LOW (ref <23)
Anticardiolipin IgM: 1 MPL U/mL — ABNORMAL LOW (ref <11)

## 2011-03-09 LAB — PROTEIN C, TOTAL: Protein C, Total: 106 % (ref 72–160)

## 2011-03-09 LAB — POCT INR: INR: 3.5

## 2011-03-09 LAB — BETA-2-GLYCOPROTEIN I ABS, IGG/M/A: Beta-2 Glyco I IgG: 0 G Units (ref <20)

## 2011-03-09 MED ORDER — WARFARIN SODIUM 5 MG PO TABS: ORAL_TABLET | ORAL | Status: DC

## 2011-03-09 NOTE — Patient Instructions (Signed)
Patient instructed to take medications as defined in the Anti-coagulation Track section of this encounter.  Patient instructed to take today's dose.  Patient verbalized understanding of these instructions.    

## 2011-03-09 NOTE — Progress Notes (Signed)
Anti-Coagulation Progress Note  Emily Cameron is a 50 y.o. female who is currently on an anti-coagulation regimen.    RECENT RESULTS: Recent results are below, the most recent result is correlated with a dose of 7.5 mg daily since discharge. Lab Results  Component Value Date   INR 3.5 03/09/2011   INR 1.04 03/06/2011   INR 0.95 03/05/2011    ANTI-COAG DOSE:   Latest dosing instructions   Total Sun Mon Tue Wed Thu Fri Sat   27.5 5 mg  5 mg 2.5 mg 5 mg 5 mg 5 mg    (5 mg1)  (5 mg1) (5 mg0.5) (5 mg1) (5 mg1) (5 mg1)         ANTICOAG SUMMARY: Anticoagulation Episode Summary              Current INR goal 2.0-3.0 Next INR check 03/15/2011   INR from last check 3.5! (03/09/2011)     Weekly max dose (mg)  Target end date Indefinite   Indications Pulmonary embolism, Monitoring for long-term anticoagulant use   INR check location [facility]erred lab    Send INR reminders to    Comments             ANTICOAG TODAY: Anticoagulation Summary as of 03/09/2011              INR goal 2.0-3.0     Selected INR 3.5! (03/09/2011) Next INR check 03/15/2011   Weekly max dose (mg)  Target end date Indefinite   Indications Pulmonary embolism, Monitoring for long-term anticoagulant use    Anticoagulation Episode Summary              INR check location [facility] Preferred lab    Send INR reminders to    Comments             PATIENT INSTRUCTIONS: Patient Instructions  Patient instructed to take medications as defined in the Anti-coagulation Track section of this encounter.  Patient instructed to take today's dose.  Patient verbalized understanding of these instructions.        FOLLOW-UP Return in 6 days (on 03/15/2011) for Follow up INR.  Hulen Luster, III Pharm.D., CACP

## 2011-03-15 ENCOUNTER — Ambulatory Visit (INDEPENDENT_AMBULATORY_CARE_PROVIDER_SITE_OTHER): Payer: Medicaid Other | Admitting: Pharmacist

## 2011-03-15 DIAGNOSIS — I2699 Other pulmonary embolism without acute cor pulmonale: Secondary | ICD-10-CM

## 2011-03-15 DIAGNOSIS — Z5181 Encounter for therapeutic drug level monitoring: Secondary | ICD-10-CM

## 2011-03-15 DIAGNOSIS — Z7901 Long term (current) use of anticoagulants: Secondary | ICD-10-CM

## 2011-03-15 NOTE — Patient Instructions (Signed)
Patient instructed to take medications as defined in the Anti-coagulation Track section of this encounter.  Patient instructed to OMIT today's dose.  Patient verbalized understanding of these instructions.    

## 2011-03-15 NOTE — Progress Notes (Signed)
Anti-Coagulation Progress Note  [name] is a 50 y.o. female who is currently on an anti-coagulation regimen.    RECENT RESULTS: Recent results are below, the most recent result is correlated with a dose of 25 mg. per week: Lab Results  Component Value Date   INR 4.90 03/15/2011   INR 3.5 03/09/2011   INR 1.04 03/06/2011    ANTI-COAG DOSE:   Latest dosing instructions   Total Sun Mon Tue Wed Thu Fri Sat   25 2.5 mg 5 mg 2.5 mg 5 mg 2.5 mg 5 mg 2.5 mg    (5 mg0.5) (5 mg1) (5 mg0.5) (5 mg1) (5 mg0.5) (5 mg1) (5 mg0.5)         ANTICOAG SUMMARY: Anticoagulation Episode Summary              Current INR goal 2.0-3.0 Next INR check 03/22/2011   INR from last check 4.90! (03/15/2011)     Weekly max dose (mg)  Target end date Indefinite   Indications Pulmonary embolism, Monitoring for long-term anticoagulant use   INR check location [facility]erred lab    Send INR reminders to    Comments             ANTICOAG TODAY: Anticoagulation Summary as of 03/15/2011              INR goal 2.0-3.0     Selected INR 4.90! (03/15/2011) Next INR check 03/22/2011   Weekly max dose (mg)  Target end date Indefinite   Indications Pulmonary embolism, Monitoring for long-term anticoagulant use    Anticoagulation Episode Summary              INR check location [facility] Preferred lab    Send INR reminders to    Comments             PATIENT INSTRUCTIONS: Patient Instructions  Patient instructed to take medications as defined in the Anti-coagulation Track section of this encounter.  Patient instructed to OMIT today's dose.  Patient verbalized understanding of these instructions.        FOLLOW-UP Return in 7 days (on 03/22/2011) for Follow up INR.  Hulen Luster, III Pharm.D., CACP

## 2011-03-19 ENCOUNTER — Encounter: Payer: Self-pay | Admitting: Internal Medicine

## 2011-03-19 ENCOUNTER — Ambulatory Visit (INDEPENDENT_AMBULATORY_CARE_PROVIDER_SITE_OTHER): Payer: Medicaid Other | Admitting: Internal Medicine

## 2011-03-19 VITALS — BP 108/66 | HR 98 | Temp 97.7°F | Ht 64.0 in | Wt 128.3 lb

## 2011-03-19 DIAGNOSIS — R42 Dizziness and giddiness: Secondary | ICD-10-CM | POA: Insufficient documentation

## 2011-03-19 DIAGNOSIS — M62838 Other muscle spasm: Secondary | ICD-10-CM | POA: Insufficient documentation

## 2011-03-19 DIAGNOSIS — Z720 Tobacco use: Secondary | ICD-10-CM | POA: Insufficient documentation

## 2011-03-19 DIAGNOSIS — F329 Major depressive disorder, single episode, unspecified: Secondary | ICD-10-CM

## 2011-03-19 DIAGNOSIS — R634 Abnormal weight loss: Secondary | ICD-10-CM

## 2011-03-19 DIAGNOSIS — J069 Acute upper respiratory infection, unspecified: Secondary | ICD-10-CM | POA: Insufficient documentation

## 2011-03-19 DIAGNOSIS — F17200 Nicotine dependence, unspecified, uncomplicated: Secondary | ICD-10-CM

## 2011-03-19 DIAGNOSIS — G4700 Insomnia, unspecified: Secondary | ICD-10-CM

## 2011-03-19 DIAGNOSIS — I2699 Other pulmonary embolism without acute cor pulmonale: Secondary | ICD-10-CM

## 2011-03-19 MED ORDER — TRAZODONE HCL 50 MG PO TABS: 50.0000 mg | ORAL_TABLET | Freq: Every day | ORAL | Status: DC

## 2011-03-19 MED ORDER — AMOXICILLIN-POT CLAVULANATE 875-125 MG PO TABS: 1.0000 | ORAL_TABLET | Freq: Two times a day (BID) | ORAL | Status: AC

## 2011-03-19 MED ORDER — CYCLOBENZAPRINE HCL 5 MG PO TABS: 5.0000 mg | ORAL_TABLET | Freq: Three times a day (TID) | ORAL | Status: AC | PRN

## 2011-03-19 MED ORDER — VARENICLINE TARTRATE 0.5 MG PO TABS: 0.5000 mg | ORAL_TABLET | Freq: Two times a day (BID) | ORAL | Status: DC

## 2011-03-19 NOTE — Assessment & Plan Note (Signed)
Stable. Patient reports that the Remeron is working well for her except that it makes her wake up in the middle of the night sometimes she would like to continue to be on Remeron. She will try taking half of a tablet instead.

## 2011-03-19 NOTE — Assessment & Plan Note (Signed)
Improving with Remeron. She has stopped Megace after hospital discharge because of the risk of PE.  She reports having good appetite and is planning to quit smoking which would also help to increase her appetite as well. -Will continue Remeron

## 2011-03-19 NOTE — Assessment & Plan Note (Signed)
Lightly upper respiratory infection from viral versus bacterial.  Patient does have hoarseness, left mandibular lymphadenopathy, green sputum, and mild erythema of oropharynx.  Nasal mucosal slightly erythematous and edematous with clear discharge.   -Will empirically treat with Augmentin 875 mg by mouth twice a day x10 days -If patient continues to have hoarseness, I will refer patient to ENT for further evaluation.

## 2011-03-19 NOTE — Assessment & Plan Note (Addendum)
Likely orthostatic hypotension. Patient is orthostatic by pulse, 76 laying and 98 standing.  Other diagnose includes dehydration.  Unlikely vertigo since patient denies any spinning sensation or dizziness with certain head movement. -Patient was instructed to not to stand up quickly and also wait a few minutes so that her body can stabilize before she start walking. -Encourage oral hydration -Check BMP today -Will continue to monitor

## 2011-03-19 NOTE — Patient Instructions (Signed)
-  Start taking Chantix as prescribed, if you start feeling more depressed or suicidal thoughts, please stop and call our clinic immediately -Throw away all your cigarettes and ash-tray -Set up a quit date and start your Chantix within 1 week of the quit date -Follow up with Dr. Anselm Jungling in 2 weeks -Get labs today, and I will call you if your lab results is abnormal -You can take Flexeril 5mg  one tablet 3 times daily for muscle spasm -Take Trazodone 50mg  one tablet 30 minutes before bedtime for sleep -Stop Ambien

## 2011-03-19 NOTE — Assessment & Plan Note (Signed)
Well-controlled. INR today is 3.0. Patient will followup with Dr. Alexandria Lodge on 03/22/11 -Continue warfarin as prescribed

## 2011-03-19 NOTE — Assessment & Plan Note (Signed)
Improving with Ambien. However given patient history of substance abuse/ dependence, I discussed with patient the possibility of becoming dependent on Ambien.  Patient agrees to try trazodone instead. Prescribed trazodone 50 mg by mouth each bedtime Patient was instructed to stop Ambien

## 2011-03-19 NOTE — Assessment & Plan Note (Signed)
Muscle spasm of left leg. Unclear etiology at this point.   -Will check BMP to make sure that her potassium is within normal limit -Also prescribed flexeril 5 mg by mouth every 8 hours when necessary spasm -I will call patient with any lab abnormalities

## 2011-03-19 NOTE — Progress Notes (Signed)
History of present illness: Ms. Emily Cameron is a 50 year old woman with past medical history of recurrent PE, depression, tobacco abuse, insomnia presents today for pain on her left mandible, dizziness, left leg muscle spasm, insomnia, and smoking cessation.  1. patient reports having pain on her left mandible and has a knot on her left mandible area associated with a headache. The duration is one-week and reports feeling feverish at home a few days ago. She also reports some throat pain and sensation of food sticking in her throat.  She has greenish sputum and what she can not bring it up and denies any cough.  She denies any sick contact, nausea or vomiting, or any systemic symptoms.  2. patient has been feeling disease in the past one week as well. She describes her dizziness as lightheadedness and denies any vertigo.  She does not have any loss of consciousness, numbness or tingling, or weakness.  Patient blood pressure has always been in the 90s over 50s which is her baseline.  Today, does show signs of orthostatic hypotension by pulse, 76 on laying and 98 on standing.  She denies any episode of bleeding or dehydration.  3.  left leg muscle spasm: 4 days in duration.  She reports cramping sensation and attributed to been very active recently.    4. Insomnia: She reports sleeping much better with the Ambien however the Remeron as may her wake up in the middle of the night and she feels like someone is taking her soul out of her body. She does not want to stop Remeron and would like to continue at half dose because it is helping with her depression as well as her appetite stimulant.  5. smoking cessation: Patient is very concerned with recurrent PE and would like to quit smoking. She was prescribed NicoDerm patch from hospital discharge however she could not afford the medication. She would like to try a pill instead.  She states that she will set the quit date this Sunday, 03/21/2011.

## 2011-03-19 NOTE — Assessment & Plan Note (Signed)
I had a long discussion with patient about the risks of recurrent PE the patient continues to smoke. She expresses her desire to quit smoking and would like to start on some medication to help her.  In addition, tobacco cessation will also have her increase her appetite.  She was given prescription for nicotine patch however she could not afford the medication.  I discussed the side effects of Chantix and patient was advised to stop the medication and notify our office immediately if she experienced worsening depression or suicidal ideation. -Chantix 0.5 mg, take one tablet daily on day 1-3, then take one tablet twice daily on day4-7, then take 2 tablets twice daily x 12 week -Patient is to set a quit date and to start taking the medication one week prior to her quit date -She was also instructed to throw away all of her cigarettes as well as her ashtray and try to stay away from people who smoke -Patient will followup with me in 2 weeks

## 2011-03-20 LAB — BASIC METABOLIC PANEL WITH GFR
BUN: 13 mg/dL (ref 6–23)
CO2: 22 mEq/L (ref 19–32)
Chloride: 109 mEq/L (ref 96–112)
Creat: 0.76 mg/dL (ref 0.50–1.10)

## 2011-03-22 ENCOUNTER — Ambulatory Visit (INDEPENDENT_AMBULATORY_CARE_PROVIDER_SITE_OTHER): Payer: Medicaid Other | Admitting: Pharmacist

## 2011-03-22 DIAGNOSIS — Z7901 Long term (current) use of anticoagulants: Secondary | ICD-10-CM

## 2011-03-22 DIAGNOSIS — I2699 Other pulmonary embolism without acute cor pulmonale: Secondary | ICD-10-CM

## 2011-03-22 DIAGNOSIS — Z5181 Encounter for therapeutic drug level monitoring: Secondary | ICD-10-CM

## 2011-03-22 NOTE — Patient Instructions (Signed)
Patient instructed to take medications as defined in the Anti-coagulation Track section of this encounter.  Patient instructed to take today's dose.  Patient verbalized understanding of these instructions.    

## 2011-03-22 NOTE — Progress Notes (Signed)
Anti-Coagulation Progress Note  WANA MOUNT is a 50 y.o. female who is currently on an anti-coagulation regimen.    RECENT RESULTS: Recent results are below, the most recent result is correlated with a dose of 25 mg. per week: Lab Results  Component Value Date   INR 2.4 03/22/2011   INR 3.0 03/19/2011   INR 4.90 03/15/2011    ANTI-COAG DOSE:   Latest dosing instructions   Total Sun Mon Tue Wed Thu Fri Sat   25 2.5 mg 5 mg 2.5 mg 5 mg 2.5 mg 5 mg 2.5 mg    (5 mg0.5) (5 mg1) (5 mg0.5) (5 mg1) (5 mg0.5) (5 mg1) (5 mg0.5)         ANTICOAG SUMMARY: Anticoagulation Episode Summary              Current INR goal 2.0-3.0 Next INR check 04/12/2011   INR from last check 2.4 (03/22/2011)     Weekly max dose (mg)  Target end date Indefinite   Indications Pulmonary embolism, Monitoring for long-term anticoagulant use   [facility] check location [facility] lab    Send INR reminders to    Comments             ANTICOAG TODAY: Anticoagulation Summary as of 03/22/2011              INR goal 2.0-3.0     Selected INR 2.4 (03/22/2011) Next INR check 04/12/2011   Weekly max dose (mg)  Target end date Indefinite   Indications Pulmonary embolism, Monitoring for long-term anticoagulant use    Anticoagulation Episode Summary              INR check location [facility]erred lab    Send INR reminders to    Comments             PATIENT INSTRUCTIONS: Patient Instructions  Patient instructed to take medications as defined in the Anti-coagulation Track section of this encounter.  Patient instructed to take today's dose.  Patient verbalized understanding of these instructions.        FOLLOW-UP Return in 3 weeks (on 04/12/2011) for Follow up INR.  Hulen Luster, III Pharm.D., CACP

## 2011-03-29 NOTE — Discharge Summary (Signed)
Emily Cameron, LEMLER NO.:  [id number]  MEDICAL RECORD NO.:  [id number]  LOCATION:  5504                         FACILITY:  [facility]  PHYSICIAN:  C. Ulyess Mort, M.D.DATE OF BIRTH:  04-15-1961  DATE OF ADMISSION:  03/05/2011 DATE OF DISCHARGE:  03/06/2011                              DISCHARGE SUMMARY   DISCHARGE DIAGNOSES: 1. Pulmonary embolism. 2. History of polysubstance abuse. 3. Depression. 4. Anxiety. 5. Constipation. 6. Tobacco abuse.  DISCHARGE MEDICATIONS: 1. Warfarin 7.5 mg p.o. daily. 2. Lovenox 60 mg/0.6 mL injection subcutaneously twice daily. 3. Megace 200 mg by mouth daily for 1 week, then 100 mg by mouth daily     for 1 week, then stop. 4. Remeron 15 mg by mouth daily at bedtime. 5. NicoDerm CQ patch 14 mg for 24 hours transdermally by once daily. 6. MiraLax 17 g by mouth daily as needed for constipation. 7. Alprazolam 0.25 mg p.o. by mouth 1 pill daily at bedtime as needed     for sleep. 8. Benzonatate 100 mg 1 capsule by mouth three times daily as needed     for cough. 9. Calcium carbonate/vitamin D OTC 1 tablet by mouth daily. 10.Benadryl 25 mg p.o. one-half tablet by mouth q.6 h. as needed for     itching. 11.Pravastatin 20 mg 1 tablet by mouth daily. 12.Ambien was 10 mg 1 tablet by mouth daily at bedtime p.r.n anxiety.  DISPOSITION AND FOLLOWUP:  Appointment with outpatient [facility]. We will call the patient back with appointment information as the office was closed this weekend, as well a followup visit in the next several weeks with Outpatient Clinic to also be called to the patient at this visit, please follow up on symptom control as well as the tapering of the Megace as well as smoking cessation.  PROCEDURES PERFORMED:  None.  CONSULTATIONS:  None.  ADMITTING HISTORY AND PHYSICAL:  This is a 50 year old woman past history of pulmonary embolism in July 2005, who presented with pleuritic left chest subcostal pain.   She also has a frontal headache.  She does deny fever, productive cough, urinary symptoms or diarrhea.  Has a recent history of URI 2 weeks ago that was productive of green phlegm which is resolved since.  Of note, she has been on Megace for her anorexia for several years and has been unwilling to discontinue it despite history of PE and current smoking status.  She was seen in clinic for shortness of breath.  A CTA was obtained that did show pulmonary embolism and she was directly admitted to the floor after this was found out.  HOME MEDICATIONS: 1. Xanax 0.25 mg tablets 1 tablet as needed at bedtime for sleep. 2. Tessalon  100 mg capsule by mouth three times daily as needed for     cough. 3. Calcium citrate/vitamin D 315/200 mg 1 tablet by mouth daily. 4. Benadryl 12.5 mg by mouth daily every 6 hours as needed for     itching. 5. Megace 400 mg and 10 mL suspension, take 400 mg by mouth daily.     They will taper off half weekly times 3 weeks and stop completely. 6. Remeron 15  mg take 1 tablet by mouth at bedtime. 7. Pravachol 20 mg take 1 tablet by mouth daily. 8. Ambien 10 mg take 1 capsule by mouth daily as needed for insomnia.  ALLERGIES:  The patient has no known allergies.  PAST MEDICAL HISTORY:  Anxiety, depression, insomnia, weight loss, PE, alcohol abuse, and polysubstance abuse.  PAST SURGICAL HISTORY:  She did have an abdominal hysterectomy.  PHYSICAL EXAMINATION:  VITAL SIGNS:  Her vital signs on this exam temperature was 98.8, heart rate 68, pulse 20, BP 105/68, and O2 saturation 95% on room air. GENERAL:  She is a pleasant woman sitting up in bed no apparent distress, cooperative with exam. CARDIAC/CHEST:  Regular rate and rhythm.  No rubs, murmurs, or gallops. Tender to palpation of the sternum as well inferolaterally around the left breast. PULMONARY:  Soft crackles were heard.  No left lower lung field.  She is moving good air volumes. ABDOMEN:  Soft and  nondistended.  Bowel sounds were present.  Mildly tender in the epigastric area. EXTREMITIES:  No pedal edema, no lesions, no skin changes. NEURO:  Alert and oriented x3.  Nerves II-XII are fully intact.  Lab results, a sodium of 139, potassium 4.2, chloride 106, bicarb 25, BUN 11, creatinine 0.63, glucose of 88, a calcium of 9.3, total protein 7.3, albumin 3.6, AST 13, ALT 16, alk phos 63, total bili 0.1, white blood count 10.7, hemoglobin 13.7, and platelet count 232.  IMAGING:  There was a CT angio the chest done with contrast that did find the aorta in normal caliber without aneurysm or dissection, single tiny filling defect identified within the right lower lobe, pulmonary artery compatible with pulmonary embolus, remaining pulmonary arteries appear pain, scattered normal sized axillary, mediastinal and hilar lymph nodes, visualized portion of upper abdomen is unremarkable, somewhat spiculated nodular density at the right apex measures 9 x 6 mm image 24, previously 8 x 5 mm, scattered emphysematous changes and peribronchial thickening, dependent atelectasis in both lungs, greater on the left.  No pulmonary infiltrates, pleural effusion or pneumothorax.  No acute osseous findings.  Other results, there was an EKG done there, did show no change from baseline normal sinus rhythm.  HOSPITAL COURSE BY PROBLEM: 1. Pulmonary embolisms.  A CT of the chest did show a small right     lower lobe PE does not actually correlate to her pain.  She does have     risk factors for pulmonary embolism as well as a previous history     of a pulmonary embolism.  She is a smoker.  She is using Megace     which a similar risk profile as OTC pills of birth control.  Other     differential would include ACS, bronchitis, musculoskeletal pain,     possible anxiety.  She does have pain to palpation on the sternum,     but is reproducible.  So we did admit to telemetry.  We started     Lovenox and Coumadin  per pharmacy.  We did get a repeat EKG in the     morning which did show normal sinus rhythm as well.  We did cycle     cardiac enzymes which were all negative.  We did continue her     Lipitor.  We gave her Tessalon for her cough.  We did give her     Xanax for anxiety and Zofran for nausea.  She was stable throughout     this admission in this  regard and was stable for discharge the     following day. 2. History of polysubstance abuse.  She did get a counseling regarding     tobacco cessation and was on nicotine patch while she was here and     did continue to wish to stop smoking at discharge and was sent home     with a nicotine patches prescription. 3. Depression.  We did continue on her Remeron and we also continued     that she was stable in this regard throughout this admission. 4. Anxiety.  We did have her on Xanax her home dose to control her     anxiety as well she was also on CIWA protocol for possible history     of alcohol which was not utilized during this admission. 5. Constipation.  We did prescribe Colace and MiraLax here in the     hospital an did send her home with MiraLax for any problem with     this in the future. 6. Tobacco abuse.  She was counseled regarding cessation and did have     a nicotine patch during this admission and would like to stop     smoking and with did send her home with nicotine patches.  She was     stable at the end of this admission for discharge and she will have     followup in [facility].  We will call her with this appointment on Monday.  VITALS ON THE DAY OF DISCHARGE:  Her temperature was 98.5, pulse 70, respirations 17, blood pressure 94/59 and she was satting 97% on room air.  Results on the day of discharge, there is a three cardiac enzymes that were negative.  There was a hypercoagulable panel that did show a normal ATP III enzymes and a normal homocysteine level.  Her white count of 9.2, hemoglobin 12.2, platelet count  225.  There was a PT/INR done the morning of discharge, PT was 13.8 seconds, INR was 1.04.  There was an antibody that was nonreactive.  Alcohol level drawn during this admission that was undetectable and sodium was 139, potassium 4.1, chloride 106, bicarb 25, BUN 11, creatinine 0.63, glucose of 88.  There is no other radiology for this admission and at this point the patient was stable for discharge.    ______________________________ Genella Mech, MD   ______________________________ C. Ulyess Mort, M.D.    EK/MEDQ  D:  03/06/2011  T:  03/06/2011  Job:  [id number]  cc:   Outpatient Clinic  Electronically Signed by Genella Mech MD on 03/11/2011 11:45:46 AM Electronically Signed by Eliezer Lofts M.D. on 03/29/2011 11:14:27 AM

## 2011-04-02 ENCOUNTER — Encounter: Payer: Medicaid Other | Admitting: Internal Medicine

## 2011-04-02 NOTE — Progress Notes (Signed)
  Subjective:    Patient ID: Emily Cameron, female    DOB: 05/31/1961, 49 y.o.   MRN: [id number]  HPI    Review of Systems     Objective:   Physical Exam        Assessment & Plan:   

## 2011-04-12 ENCOUNTER — Ambulatory Visit (INDEPENDENT_AMBULATORY_CARE_PROVIDER_SITE_OTHER): Payer: Medicaid Other | Admitting: Pharmacist

## 2011-04-12 DIAGNOSIS — Z5181 Encounter for therapeutic drug level monitoring: Secondary | ICD-10-CM

## 2011-04-12 DIAGNOSIS — I2699 Other pulmonary embolism without acute cor pulmonale: Secondary | ICD-10-CM

## 2011-04-12 DIAGNOSIS — Z7901 Long term (current) use of anticoagulants: Secondary | ICD-10-CM

## 2011-04-12 NOTE — Patient Instructions (Signed)
Patient instructed to take medications as defined in the Anti-coagulation Track section of this encounter.  Patient instructed to take today's dose.  Patient verbalized understanding of these instructions.    

## 2011-04-12 NOTE — Progress Notes (Signed)
Anti-Coagulation Progress Note  Emily Cameron is a 50 y.o. female who is currently on an anti-coagulation regimen.    RECENT RESULTS: Recent results are below, the most recent result is correlated with a dose of 25 mg. per week: Lab Results  Component Value Date   INR 2.00 04/12/2011   INR 2.4 03/22/2011   INR 3.0 03/19/2011    ANTI-COAG DOSE:   Latest dosing instructions   Total Sun Mon Tue Wed Thu Fri Sat   27.5 2.5 mg 5 mg 5 mg 5 mg 2.5 mg 5 mg 2.5 mg    (5 mg0.5) (5 mg1) (5 mg1) (5 mg1) (5 mg0.5) (5 mg1) (5 mg0.5)         ANTICOAG SUMMARY: Anticoagulation Episode Summary              Current INR goal 2.0-3.0 Next INR check 05/03/2011   INR from last check 2.00 (04/12/2011)     Weekly max dose (mg)  Target end date Indefinite   Indications Pulmonary embolism, Monitoring for long-term anticoagulant use   [facility] check location [facility] Preferred lab    Send INR reminders to    Comments             ANTICOAG TODAY: Anticoagulation Summary as of 04/12/2011              INR goal 2.0-3.0     Selected INR 2.00 (04/12/2011) Next INR check 05/03/2011   Weekly max dose (mg)  Target end date Indefinite   Indications Pulmonary embolism, Monitoring for long-term anticoagulant use    Anticoagulation Episode Summary              INR check location [facility] Preferred lab    Send INR reminders to    Comments             PATIENT INSTRUCTIONS: Patient Instructions  Patient instructed to take medications as defined in the Anti-coagulation Track section of this encounter.  Patient instructed to take today's dose.  Patient verbalized understanding of these instructions.        FOLLOW-UP Return in 3 weeks (on 05/03/2011) for Follow up INR.  Hulen Luster, III Pharm.D., CACP

## 2011-04-28 ENCOUNTER — Telehealth: Payer: Self-pay | Admitting: Licensed Clinical Social Worker

## 2011-04-28 NOTE — Telephone Encounter (Signed)
Telephone consult with patient.  She reported [facility].  I have explained to her how the new [facility] works and that they now have walk-in hours from 8 AM to 3 PM.   I have asked her to get reconnected with them due to our physicians wanting a psychiatric assessment and review regarding her medications.  I have also asked her to consider counseling as well which is offered thru [facility].  The patient has Medicaid as payor source.   I've asked her to please reconnect and let us know the plan by her next appmt.

## 2011-05-03 ENCOUNTER — Ambulatory Visit (INDEPENDENT_AMBULATORY_CARE_PROVIDER_SITE_OTHER): Payer: Medicaid Other | Admitting: Pharmacist

## 2011-05-03 ENCOUNTER — Encounter: Payer: Self-pay | Admitting: Internal Medicine

## 2011-05-03 ENCOUNTER — Ambulatory Visit (INDEPENDENT_AMBULATORY_CARE_PROVIDER_SITE_OTHER): Payer: Medicaid Other | Admitting: Internal Medicine

## 2011-05-03 VITALS — BP 112/71 | HR 77 | Temp 98.0°F | Ht 64.0 in | Wt 135.3 lb

## 2011-05-03 DIAGNOSIS — K219 Gastro-esophageal reflux disease without esophagitis: Secondary | ICD-10-CM

## 2011-05-03 DIAGNOSIS — Z7901 Long term (current) use of anticoagulants: Secondary | ICD-10-CM

## 2011-05-03 DIAGNOSIS — G4700 Insomnia, unspecified: Secondary | ICD-10-CM

## 2011-05-03 DIAGNOSIS — Z5181 Encounter for therapeutic drug level monitoring: Secondary | ICD-10-CM

## 2011-05-03 DIAGNOSIS — R079 Chest pain, unspecified: Secondary | ICD-10-CM

## 2011-05-03 DIAGNOSIS — Z23 Encounter for immunization: Secondary | ICD-10-CM

## 2011-05-03 DIAGNOSIS — E785 Hyperlipidemia, unspecified: Secondary | ICD-10-CM

## 2011-05-03 DIAGNOSIS — I2699 Other pulmonary embolism without acute cor pulmonale: Secondary | ICD-10-CM

## 2011-05-03 LAB — POCT INR: INR: 2.7

## 2011-05-03 MED ORDER — ZOLPIDEM TARTRATE 5 MG PO TABS: 5.0000 mg | ORAL_TABLET | Freq: Every evening | ORAL | Status: DC | PRN

## 2011-05-03 MED ORDER — OMEPRAZOLE 20 MG PO TBEC: 20.0000 mg | DELAYED_RELEASE_TABLET | Freq: Every day | ORAL | Status: DC

## 2011-05-03 NOTE — Assessment & Plan Note (Signed)
Patient noted that Xanax was not helping her at along but it made her very jittery. I will d/c it and will start her on ambien.

## 2011-05-03 NOTE — Progress Notes (Signed)
Subjective:   Patient ID: Emily Cameron female   DOB: [birth year] 50 y.o.   MRN: [id number]  HPI: [name] is a 50 y.o.  With PMH significant as outline below who presented with chest discomfort.  Patient noted since 1 weeks she was experiencing chest pain, dull in character, substernal, non-radiating, present while resting and activity, not aggravated or elevated by anything. She further mentioned SOB, dizziness and some  Chills. No recent travel, immobilization.  She further noted that she sig. Troubles sleeping. When she lies in bed she feels very anxious and jittery.   Past Medical History  Diagnosis Date  . Anxiety   . Depression   . Insomnia   . Weight loss     due to anxiety  . PE (pulmonary embolism) 2006  . Alcohol abuse   . Polysubstance abuse     Current smoking and alcohol. 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 Marijuna not taking since 7 month.    Current Outpatient Prescriptions  Medication Sig Dispense Refill  . cyclobenzaprine (FLEXERIL) 5 MG tablet Take 1 tablet (5 mg total) by mouth every 8 (eight) hours as needed for muscle spasms.  30 tablet  0  . mirtazapine (REMERON) 15 MG tablet Take 1 tablet (15 mg total) by mouth at bedtime.  30 tablet  2  . pravastatin (PRAVACHOL) 20 MG tablet Take 1 tablet (20 mg total) by mouth daily. To decrease cholesterol  30 tablet  3  . warfarin (COUMADIN) 5 MG tablet Take as directed by anticoagulation clinic provider.  30 tablet  2  . benzonatate (TESSALON) 100 MG capsule Take 1 capsule (100 mg total) by mouth 3 (three) times daily as needed. For cough  30 capsule  3  . calcium citrate-vitamin D (CITRACAL+D) 315-200 MG-UNIT per tablet Take 1 tablet by mouth daily.  100 tablet  3  . Omeprazole 20 MG TBEC Take 1 tablet (20 mg total) by mouth daily.  30 each  3  . polyethylene glycol (MIRALAX / GLYCOLAX) packet Take 17 g by mouth daily.        Marland Kitchen zolpidem (AMBIEN) 5 MG tablet Take 1 tablet (5 mg total) by mouth at  bedtime as needed for sleep.  30 tablet  0  Review of Systems: Constitutional: Denies fever,  diaphoresis, appetite change and fatigue. HEENT: Denies photophobia, eye pain,congestion, , neck stiffness and tinnitus.  But she noted some neck pain.  Respiratory: Denies, DOE, cough,  and wheezing.  but noted some  chest tightness and SOB.  Cardiovascular: noted chest pain, palpitations and leg swelling.  Gastrointestinal: Denies nausea, vomiting, abdominal pain, diarrhea, constipation, blood in stool and abdominal distention.  Genitourinary: Denies dysuria, urgency, frequency, hematuria, flank pain and difficulty urinating.  Musculoskeletal: Denies myalgias, back pain, joint swelling, arthralgias and gait problem.  Skin: Denies pallor, rash and wound.  Neurological: Denies dizziness, seizures, syncope, weakness, light-headedness, numbness and headaches.  Psychiatric/Behavioral:nervousness, sleep disturbance and agitation  Objective:  Physical Exam: Filed Vitals:   05/03/11 1641  BP: 112/71  Pulse: 77  Temp: 98 F (36.7 C)  TempSrc: Oral  Height: 5\' 4"  (1.626 m)  Weight: 135 lb 4.8 oz (61.372 kg)   Constitutional: Vital signs reviewed.  Patient is a well-developed and well-nourished,  in no acute distress and cooperative with exam. Alert and oriented x3.  Head: Normocephalic and atraumatic Mouth: no erythema or exudates, MMM Eyes: PERRL, EOMI, conjunctivae normal, No scleral icterus.  Neck: Supple, Trachea midline  normal ROM, No JVD, mass, Cardiovascular: RRR, S1 normal, S2 normal, no MRG, pulses symmetric and intact bilaterally. Mild chest wall tenderness.  Pulmonary/Chest: CTAB, no wheezes, rales, or rhonchi Abdominal: Soft. Non-tender, non-distended, bowel sounds are normal, no masses, organomegaly, or guarding present.  GU: no CVA tenderness Musculoskeletal: No joint deformities, erythema, or stiffness, ROM full and no nontender Neurological: A&O x3, Strenght is normal and symmetric  bilaterally, cranial nerve II-XII are grossly intact, no focal motor deficit, sensory intact to light touch bilaterally.  Skin: Warm, dry and intact. No rash, cyanosis, or clubbing.  Psychiatric: Normal mood and affect. speech and behavior is normal. Judgment and thought content normal. Cognition and memory are normal.    Assessment & Plan:

## 2011-05-03 NOTE — Progress Notes (Deleted)
  Subjective:    Patient ID: Emily Cameron, female    DOB: 23-Feb-1961, 50 y.o.   MRN: [id number]  HPI Restless, foot gets hot, chest pain, sob, cough  Since 1 week : dull , rsting and excersice , no pain , relax. Chills,     Review of Systems     Objective:   Physical Exam        Assessment & Plan:

## 2011-05-03 NOTE — Assessment & Plan Note (Signed)
Well controlled. Will continue current regimen. Lab Results  Component Value Date   CHOL 199 02/17/2011   CHOL 153 12/18/2008   CHOL 199 05/27/2008   Lab Results  Component Value Date   HDL 58 02/17/2011   HDL 41 1/61/0960   HDL 58 45/11/979   Lab Results  Component Value Date   LDLCALC 124* 02/17/2011   LDLCALC 99 12/18/2008   LDLCALC 126* 05/27/2008   Lab Results  Component Value Date   TRIG 85 02/17/2011   TRIG 67 12/18/2008   TRIG 75 05/27/2008   Lab Results  Component Value Date   CHOLHDL 3.4 02/17/2011   CHOLHDL 3.7 Ratio 12/18/2008   CHOLHDL 3.4 Ratio 05/27/2008   No results found for this basename: LDLDIRECT

## 2011-05-03 NOTE — Progress Notes (Signed)
Anti-Coagulation Progress Note  Emily Cameron is a 50 y.o. female who is currently on an anti-coagulation regimen.    RECENT RESULTS: Recent results are below, the most recent result is correlated with a dose of 27.5 mg. per week: Lab Results  Component Value Date   INR 2.7 05/03/2011   INR 2.00 04/12/2011   INR 2.4 03/22/2011    ANTI-COAG DOSE:   Latest dosing instructions   Total Sun Mon Tue Wed Thu Fri Sat   27.5 2.5 mg 5 mg 5 mg 5 mg 2.5 mg 5 mg 2.5 mg    (5 mg0.5) (5 mg1) (5 mg1) (5 mg1) (5 mg0.5) (5 mg1) (5 mg0.5)         ANTICOAG SUMMARY: Anticoagulation Episode Summary              Current INR goal 2.0-3.0 Next INR check 05/31/2011   INR from last check 2.7 (05/03/2011)     Weekly max dose (mg)  Target end date Indefinite   Indications Pulmonary embolism, Monitoring for long-term anticoagulant use   INR check location [facility] Preferred lab    Send INR reminders to    Comments             ANTICOAG TODAY: Anticoagulation Summary as of 05/03/2011              INR goal 2.0-3.0     Selected INR 2.7 (05/03/2011) Next INR check 05/31/2011   Weekly max dose (mg)  Target end date Indefinite   Indications Pulmonary embolism, Monitoring for long-term anticoagulant use    Anticoagulation Episode Summary              INR check location [facility] Preferred lab    Send INR reminders to    Comments             PATIENT INSTRUCTIONS: Patient Instructions  Patient instructed to take medications as defined in the Anti-coagulation Track section of this encounter.  Patient instructed to take today's dose.  Patient verbalized understanding of these instructions.  CONTINUE taking 1/2 x 5mg  (2.5mg ) on SUN/THURSDAY/SATURDAY; take 1x5mg  tablet on all other days.      FOLLOW-UP Return in 4 weeks (on 05/31/2011) for Follow up INR.  Hulen Luster, III Pharm.D., CACP

## 2011-05-03 NOTE — Assessment & Plan Note (Signed)
DD include any ACS although very atypical presentation. An ECG was performed which did not show any acute changes compared to last ECG. Other DD include PE but patient is coumadin and INR therapeutic . I think at this point anxiety plays a role but may be some reflux,too. Patient has some chronic cough and this has not been improving which may be underlying reflux disease. Started on omeprazole. I informed the patient if she is noticing significant pain she should be evaluated again in the clinic.

## 2011-05-03 NOTE — Patient Instructions (Addendum)
Patient instructed to take medications as defined in the Anti-coagulation Track section of this encounter.  Patient instructed to take today's dose.  Patient verbalized understanding of these instructions.  CONTINUE taking 1/2 x 5mg  (2.5mg ) on SUN/THURSDAY/SATURDAY; take 1x5mg  tablet on all other days.   Return to clinic on May 31, 2011 at 2:30PM

## 2011-05-25 ENCOUNTER — Other Ambulatory Visit: Payer: Self-pay | Admitting: *Deleted

## 2011-05-25 DIAGNOSIS — E785 Hyperlipidemia, unspecified: Secondary | ICD-10-CM

## 2011-05-25 DIAGNOSIS — G4700 Insomnia, unspecified: Secondary | ICD-10-CM

## 2011-05-25 DIAGNOSIS — F329 Major depressive disorder, single episode, unspecified: Secondary | ICD-10-CM

## 2011-05-25 DIAGNOSIS — R630 Anorexia: Secondary | ICD-10-CM

## 2011-05-25 MED ORDER — PRAVASTATIN SODIUM 20 MG PO TABS: 20.0000 mg | ORAL_TABLET | Freq: Every day | ORAL | Status: DC

## 2011-05-25 MED ORDER — CALCIUM CITRATE-VITAMIN D 315-200 MG-UNIT PO TABS: 1.0000 | ORAL_TABLET | Freq: Every day | ORAL | Status: DC

## 2011-05-25 MED ORDER — MIRTAZAPINE 15 MG PO TABS: 15.0000 mg | ORAL_TABLET | Freq: Every day | ORAL | Status: DC

## 2011-05-25 MED ORDER — ZOLPIDEM TARTRATE 5 MG PO TABS: 5.0000 mg | ORAL_TABLET | Freq: Every evening | ORAL | Status: DC | PRN

## 2011-05-25 NOTE — Telephone Encounter (Signed)
Ambien and Remeron rxs faxed to [facility] pharmacy.

## 2011-05-31 ENCOUNTER — Encounter: Payer: Medicaid Other | Admitting: Internal Medicine

## 2011-05-31 ENCOUNTER — Ambulatory Visit (INDEPENDENT_AMBULATORY_CARE_PROVIDER_SITE_OTHER): Payer: Medicaid Other | Admitting: Pharmacist

## 2011-05-31 DIAGNOSIS — Z5181 Encounter for therapeutic drug level monitoring: Secondary | ICD-10-CM

## 2011-05-31 DIAGNOSIS — Z7901 Long term (current) use of anticoagulants: Secondary | ICD-10-CM

## 2011-05-31 DIAGNOSIS — I2699 Other pulmonary embolism without acute cor pulmonale: Secondary | ICD-10-CM

## 2011-05-31 NOTE — Progress Notes (Signed)
Indication: Recurrent Pulmonary emboli (2006 and 2012) Duration Life long  Please note that a spiculated nodule was seen on chest CT on 03/05/2011.  This requires CT follow-up in 3 months (now).  Please order CT scan of thorax to follow-up spiculated lung nodule.  Thanks.

## 2011-05-31 NOTE — Patient Instructions (Signed)
Patient instructed to take medications as defined in the Anti-coagulation Track section of this encounter.  Patient instructed to take today's dose.  Patient verbalized understanding of these instructions.    

## 2011-05-31 NOTE — Progress Notes (Signed)
Anti-Coagulation Progress Note  Emily Cameron is a 50 y.o. female who is currently on an anti-coagulation regimen.    RECENT RESULTS: Recent results are below, the most recent result is correlated with a dose of 27.5 mg. per week: Lab Results  Component Value Date   INR 1.30 05/31/2011   INR 2.7 05/03/2011   INR 2.00 04/12/2011    ANTI-COAG DOSE:   Latest dosing instructions   Total Sun Mon Tue Wed Thu Fri Sat   27.5 2.5 mg 5 mg 5 mg 5 mg 2.5 mg 5 mg 2.5 mg    (5 mg0.5) (5 mg1) (5 mg1) (5 mg1) (5 mg0.5) (5 mg1) (5 mg0.5)         ANTICOAG SUMMARY: Anticoagulation Episode Summary              Current INR goal 2.0-3.0 Next INR check 06/21/2011   INR from last check 1.30! (05/31/2011)     Weekly max dose (mg)  Target end date Indefinite   Indications Pulmonary embolism, Monitoring for long-term anticoagulant use   [facility]    Send INR reminders to    Comments             ANTICOAG TODAY: Anticoagulation Summary as of 05/31/2011              INR goal 2.0-3.0     Selected INR 1.30! (05/31/2011) Next INR check 06/21/2011   Weekly max dose (mg)  Target end date Indefinite   Indications Pulmonary embolism, Monitoring for long-term anticoagulant use    Anticoagulation Episode Summary              [facility] check location [facility]    Send INR reminders to    Comments             PATIENT INSTRUCTIONS: Patient Instructions  Patient instructed to take medications as defined in the Anti-coagulation Track section of this encounter.  Patient instructed to take today's dose.  Patient verbalized understanding of these instructions.        FOLLOW-UP Return in 3 weeks (on 06/21/2011) for Follow up INR.  Hulen Luster, III Pharm.D., CACP

## 2011-06-04 LAB — URINALYSIS, ROUTINE W REFLEX MICROSCOPIC
Glucose, UA: NEGATIVE
Hgb urine dipstick: NEGATIVE
Ketones, ur: 15 — AB
Protein, ur: NEGATIVE
Urobilinogen, UA: 1

## 2011-06-04 LAB — I-STAT 8, (EC8 V) (CONVERTED LAB)
BUN: 4 — ABNORMAL LOW
Bicarbonate: 25.2 — ABNORMAL HIGH
Glucose, Bld: 77
HCT: 47 — ABNORMAL HIGH
Operator id: 285841
pCO2, Ven: 44.4 — ABNORMAL LOW
pH, Ven: 7.362 — ABNORMAL HIGH

## 2011-06-04 LAB — URINE MICROSCOPIC-ADD ON

## 2011-06-08 ENCOUNTER — Ambulatory Visit (INDEPENDENT_AMBULATORY_CARE_PROVIDER_SITE_OTHER): Payer: Medicaid Other | Admitting: Internal Medicine

## 2011-06-08 ENCOUNTER — Encounter: Payer: Self-pay | Admitting: Internal Medicine

## 2011-06-08 VITALS — BP 108/66 | HR 71 | Temp 98.7°F | Ht 63.0 in | Wt 136.9 lb

## 2011-06-08 DIAGNOSIS — I2699 Other pulmonary embolism without acute cor pulmonale: Secondary | ICD-10-CM

## 2011-06-08 DIAGNOSIS — R079 Chest pain, unspecified: Secondary | ICD-10-CM

## 2011-06-08 DIAGNOSIS — R911 Solitary pulmonary nodule: Secondary | ICD-10-CM

## 2011-06-08 DIAGNOSIS — J984 Other disorders of lung: Secondary | ICD-10-CM

## 2011-06-08 DIAGNOSIS — K219 Gastro-esophageal reflux disease without esophagitis: Secondary | ICD-10-CM

## 2011-06-08 NOTE — Assessment & Plan Note (Signed)
Anticoagulation work up within normal limits in 02/2011.

## 2011-06-08 NOTE — Assessment & Plan Note (Addendum)
Unclear if throat discomfort and chest pain is coming from possible dysmotility or any strictures. Omeprazole did not improve symptoms but it was low dose. Will obtain barium swallow to evaluate for possible dysmotility and manage accordingly.

## 2011-06-08 NOTE — Assessment & Plan Note (Addendum)
May be related to esophageal spasm. ECG at the last office visit was negative. Omeprazole did not improve her symptoms. Will obtain barium swallow to evaluate for possible dysmotility and manage accordingly. Sharp chest pain is also worrisome for PE but patient INR has been in therapeutic range except on 10/8 when she missed 4 days of coumadin. She has been experiencing the chest discomfort for more then one month therefore highly unlikely.

## 2011-06-08 NOTE — Progress Notes (Signed)
Subjective:   Patient ID: Emily Cameron female   DOB: Oct 09, 1960 50 y.o.   MRN: [id number]  HPI: [name] is a 50 y.o. female with PMH significant as outlined below who presented to the clinic for continues problems with swallowing and chest pain. Patient noted that whenever she is eating she has the feeling that she is choking . It seems there is a lump in there throat and she can not get it down. She further noted scratchiness all the time and she has to clear her throat on a regular basis. She further reports of chest pain which she has been present for more then one month ( patient was evaluated on 9/10 and ECG was normal at that time) . She noted it comes and goes, it is sometimes worse with food. It is in the middle of the chest and sometimes moves to the left side and associated occasionally with some SOB. The pain has been aching, sharp and dull in character.     Past Medical History  Diagnosis Date  . Anxiety   . Depression   . Insomnia   . Weight loss     due to anxiety  . PE (pulmonary embolism) 2006  . Alcohol abuse   . Polysubstance abuse     Current smoking and alcohol. 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 Marijuna not taking since 7 month.    Current Outpatient Prescriptions  Medication Sig Dispense Refill  . benzonatate (TESSALON) 100 MG capsule Take 1 capsule (100 mg total) by mouth 3 (three) times daily as needed. For cough  30 capsule  3  . calcium citrate-vitamin D (CITRACAL+D) 315-200 MG-UNIT per tablet Take 1 tablet by mouth daily.  100 tablet  3  . cyclobenzaprine (FLEXERIL) 5 MG tablet Take 1 tablet (5 mg total) by mouth every 8 (eight) hours as needed for muscle spasms.  30 tablet  0  . mirtazapine (REMERON) 15 MG tablet Take 1 tablet (15 mg total) by mouth at bedtime.  30 tablet  3  . Omeprazole 20 MG TBEC Take 1 tablet (20 mg total) by mouth daily.  30 each  3  . polyethylene glycol (MIRALAX / GLYCOLAX) packet Take 17 g by mouth daily.         . pravastatin (PRAVACHOL) 20 MG tablet Take 1 tablet (20 mg total) by mouth daily. To decrease cholesterol  30 tablet  3  . warfarin (COUMADIN) 5 MG tablet Take as directed by anticoagulation clinic provider.  30 tablet  2  . zolpidem (AMBIEN) 5 MG tablet Take 1 tablet (5 mg total) by mouth at bedtime as needed for sleep.  30 tablet  0   Family History  Problem Relation Age of Onset  . Coronary artery disease Sister   . Heart disease Mother   . Hyperlipidemia Mother   . Hypertension Mother   . Aneurysm Brother 43  . Aneurysm Paternal Aunt 58    Died   . Heart disease Maternal Grandmother   . Pulmonary embolism Brother    Review of Systems: Constitutional: Denies fever, chills, diaphoresis, appetite change and fatigue.  HEENT: Denies photophobia, eye pain, redness, hearing loss, ear pain, congestion, hinorrhea, sneezing, mouth sores, neck pain, neck stiffness and tinnitus.   Respiratory: Denies , DOE, cough, chest tightness,  and wheezing.   Cardiovascular: Noted chest pain, palpitations Gastrointestinal: Denies nausea, vomiting, abdominal pain, diarrhea, constipation, blood in stool and abdominal distention.  Genitourinary: Denies dysuria, urgency,  frequency, hematuria, flank pain and difficulty urinating.  Skin: Denies pallor, rash and wound.  Neurological: Denies dizziness, seizures, syncope, weakness, light-headedness, numbness and headaches.  Hematological: Denies adenopathy. Easy bruising, personal or family bleeding history    Objective:  Physical Exam: Filed Vitals:   06/08/11 1613  BP: 108/66  Pulse: 71  Temp: 98.7 F (37.1 C)  TempSrc: Oral  Height: 5\' 3"  (1.6 m)  Weight: 136 lb 14.4 oz (62.097 kg)   Constitutional: Vital signs reviewed.  Patient is a well-developed and well-nourished  in no acute distress and cooperative with exam. Alert and oriented x3.  Mouth: no erythema or exudates, MMM Neck: Supple, Trachea midline normal ROM,  thyromegaly,    Cardiovascular: RRR, S1 normal, S2 normal, no MRG, pulses symmetric and intact bilaterally Pulmonary/Chest: CTAB, no wheezes, rales, or rhonchi Abdominal: Soft. Non-tender, non-distended, bowel sounds are normal, no masses, organomegaly, or guarding present.  GU: no CVA tenderness Musculoskeletal: No joint deformities, erythema, or stiffness, ROM full and no nontender Hematology: no cervical Neurological: A&O x3, , no focal motor deficit, sensory intact to light touch bilaterally.  Skin: Warm, dry and intact. No rash, cyanosis, or clubbing.  Psychiatric: Normal mood and affect. speech and behavior is normal.

## 2011-06-08 NOTE — Progress Notes (Deleted)
  Subjective:    Patient ID: Emily Cameron, female    DOB: 05/03/1961, 50 y.o.   MRN: [id number]  HPI    Review of Systems     Objective:   Physical Exam        Assessment & Plan:

## 2011-06-21 ENCOUNTER — Ambulatory Visit (INDEPENDENT_AMBULATORY_CARE_PROVIDER_SITE_OTHER): Payer: Medicaid Other | Admitting: Pharmacist

## 2011-06-21 DIAGNOSIS — I2699 Other pulmonary embolism without acute cor pulmonale: Secondary | ICD-10-CM

## 2011-06-21 DIAGNOSIS — Z7901 Long term (current) use of anticoagulants: Secondary | ICD-10-CM

## 2011-06-21 DIAGNOSIS — Z5181 Encounter for therapeutic drug level monitoring: Secondary | ICD-10-CM

## 2011-06-21 LAB — POCT INR: INR: 1.8

## 2011-06-21 MED ORDER — WARFARIN SODIUM 5 MG PO TABS: ORAL_TABLET | ORAL | Status: DC

## 2011-06-21 NOTE — Progress Notes (Signed)
Anti-Coagulation Progress Note  Emily Cameron is a 50 y.o. female who is currently on an anti-coagulation regimen.    RECENT RESULTS: Recent results are below, the most recent result is correlated with a dose of 27.5 mg. per week: Lab Results  Component Value Date   INR 1.80 06/21/2011   INR 1.30 05/31/2011   INR 2.7 05/03/2011    ANTI-COAG DOSE:   Latest dosing instructions   Total Sun Mon Tue Wed Thu Fri Sat   32.5 2.5 mg 5 mg 5 mg 5 mg 5 mg 5 mg 5 mg    (5 mg0.5) (5 mg1) (5 mg1) (5 mg1) (5 mg1) (5 mg1) (5 mg1)         ANTICOAG SUMMARY: Anticoagulation Episode Summary              Current INR goal 2.0-3.0 Next INR check 07/12/2011   INR from last check 1.80! (06/21/2011)     Weekly max dose (mg)  Target end date Indefinite   Indications Pulmonary embolism, Monitoring for long-term anticoagulant use   [facility] check location [facility] Preferred [facility]    Send INR reminders to    Comments             ANTICOAG TODAY: Anticoagulation Summary as of 06/21/2011              INR goal 2.0-3.0     Selected INR 1.80! (06/21/2011) Next INR check 07/12/2011   Weekly max dose (mg)  Target end date Indefinite   Indications Pulmonary embolism, Monitoring for long-term anticoagulant use    Anticoagulation Episode Summary              IN[facility] check location [facility] Preferred [facility]    Send INR reminders to    Comments             PATIENT INSTRUCTIONS: Patient Instructions  Patient instructed to take medications as defined in the Anti-coagulation Track section of this encounter.  Patient instructed to take today's dose.  Patient verbalized understanding of these instructions.        FOLLOW-UP Return in 3 weeks (on 07/12/2011) for Follow up INR.  Hulen Luster, III Pharm.D., CACP

## 2011-06-21 NOTE — Patient Instructions (Signed)
Patient instructed to take medications as defined in the Anti-coagulation Track section of this encounter.  Patient instructed to take today's dose.  Patient verbalized understanding of these instructions.    

## 2011-06-29 ENCOUNTER — Other Ambulatory Visit (HOSPITAL_COMMUNITY): Payer: Medicaid Other

## 2011-07-08 ENCOUNTER — Other Ambulatory Visit (HOSPITAL_COMMUNITY): Payer: Medicaid Other

## 2011-07-08 ENCOUNTER — Inpatient Hospital Stay (HOSPITAL_COMMUNITY): Admission: RE | Admit: 2011-07-08 | Payer: Medicaid Other | Source: Ambulatory Visit

## 2011-07-16 ENCOUNTER — Encounter: Payer: Self-pay | Admitting: Internal Medicine

## 2011-07-16 ENCOUNTER — Other Ambulatory Visit: Payer: Self-pay | Admitting: Internal Medicine

## 2011-07-16 DIAGNOSIS — R911 Solitary pulmonary nodule: Secondary | ICD-10-CM

## 2011-07-19 ENCOUNTER — Ambulatory Visit (INDEPENDENT_AMBULATORY_CARE_PROVIDER_SITE_OTHER): Payer: Medicaid Other | Admitting: Pharmacist

## 2011-07-19 DIAGNOSIS — I2699 Other pulmonary embolism without acute cor pulmonale: Secondary | ICD-10-CM

## 2011-07-19 DIAGNOSIS — Z7901 Long term (current) use of anticoagulants: Secondary | ICD-10-CM

## 2011-07-19 LAB — POCT INR: INR: 3.2

## 2011-07-19 NOTE — Progress Notes (Signed)
Anti-Coagulation Progress Note  Emily Cameron is a 50 y.o. female who is currently on an anti-coagulation regimen.    RECENT RESULTS: Recent results are below, the most recent result is correlated with a dose of 35 mg. per week: Lab Results  Component Value Date   INR 3.2 07/19/2011   INR 1.80 06/21/2011   INR 1.30 05/31/2011    ANTI-COAG DOSE:   Latest dosing instructions   Total Sun Mon Tue Wed Thu Fri Sat   32.5 2.5 mg 5 mg 5 mg 5 mg 5 mg 5 mg 5 mg    (5 mg0.5) (5 mg1) (5 mg1) (5 mg1) (5 mg1) (5 mg1) (5 mg1)         ANTICOAG SUMMARY: Anticoagulation Episode Summary              Current INR goal 2.0-3.0 Next INR check 08/30/2011   INR from last check 3.2! (07/19/2011)     Weekly max dose (mg)  Target end date Indefinite   Indications Pulmonary embolism, Monitoring for long-term anticoagulant use   [facility] check location [facility] Pref[facility]    Send INR reminders to    Comments 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             ANTICOAG TODAY: Anticoagulation Summary as of 07/19/2011              INR goal 2.0-3.0     Selected INR 3.2! (07/19/2011) Next INR check 08/30/2011   Weekly max dose (mg)  Target end date Indefinite   Indications Pulmonary embolism, Monitoring for long-term anticoagulant use    Anticoagulation Episode Summary              [facility] check location [facility] Preferred [facility]    Send INR reminders to    Comments 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             PATIENT INSTRUCTIONS: Patient Instructions  Patient instructed to take medications as defined in the Anti-coagulation Track section of this encounter.  Patient instructed to take today's dose.  Patient verbalized understanding of these instructions.  Patient instructed to take warfarin (5mg  strength tablet) as follows:  Take ONE WHOLE TABLET (5mg ) on EVERY DAY OF THE WEEK----EXCEPT on Sundays of each week. On Sundays-take ONLY  ONE-HALF (1/2) tablet.        FOLLOW-UP Return in 6 weeks (on 08/30/2011) for Follow up INR at 3:30PM.  Hulen Luster, III Pharm.D., CACP

## 2011-07-19 NOTE — Patient Instructions (Signed)
Patient instructed to take medications as defined in the Anti-coagulation Track section of this encounter.  Patient instructed to take today's dose.  Patient verbalized understanding of these instructions.  Patient instructed to take warfarin (5mg  strength tablet) as follows:  Take ONE WHOLE TABLET (5mg ) on EVERY DAY OF THE WEEK----EXCEPT on Sundays of each week. On Sundays-take ONLY ONE-HALF (1/2) tablet.

## 2011-07-22 ENCOUNTER — Ambulatory Visit (HOSPITAL_COMMUNITY)
Admission: RE | Admit: 2011-07-22 | Discharge: 2011-07-22 | Disposition: A | Discharge status: Home / Self Care | Payer: Medicaid Other | Source: Ambulatory Visit | Attending: Internal Medicine | Admitting: Internal Medicine

## 2011-07-22 ENCOUNTER — Other Ambulatory Visit (HOSPITAL_COMMUNITY): Payer: Medicaid Other

## 2011-07-22 DIAGNOSIS — J984 Other disorders of lung: Secondary | ICD-10-CM | POA: Insufficient documentation

## 2011-07-22 DIAGNOSIS — R911 Solitary pulmonary nodule: Secondary | ICD-10-CM

## 2011-07-22 DIAGNOSIS — Z09 Encounter for follow-up examination after completed treatment for conditions other than malignant neoplasm: Secondary | ICD-10-CM | POA: Insufficient documentation

## 2011-07-22 IMAGING — RF DG ESOPHAGUS
15 of 24 series · 15 of 24 positions shown · non-contrast
Comparison: None.

CLINICAL DATA: Full sensation in throat.  Dysphagia.

ESOPHOGRAM/BARIUM SWALLOW
TECHNIQUE: Combined double contrast and single contrast
examination performed using effervescent crystals, thick barium
liquid, and thin barium liquid.
Fluoroscopy time:  2.27 minutes.

[Series 1: run · 1 of 1 slices shown (1 of 15)]
[im 1/1]
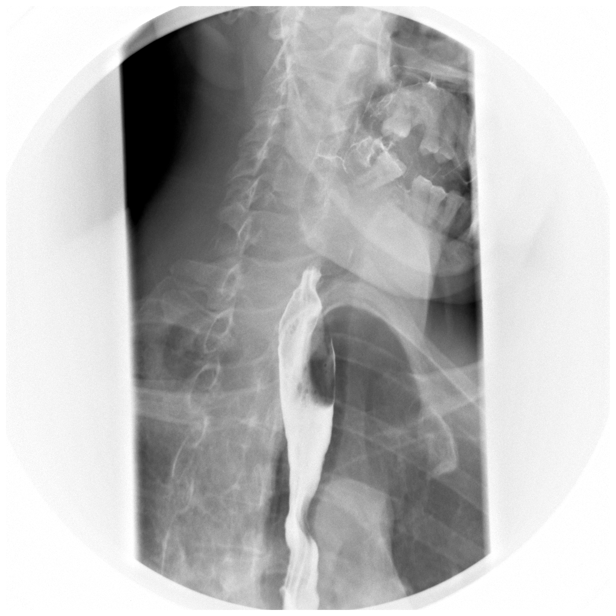

[Series 3: run · 1 of 1 slices shown (2 of 15)]
[im 1/1]
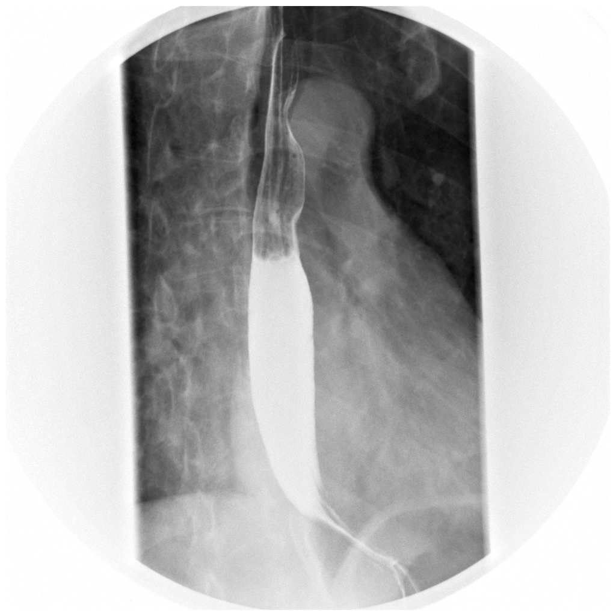

[Series 5: run · 1 of 1 slices shown (3 of 15)]
[im 1/1]
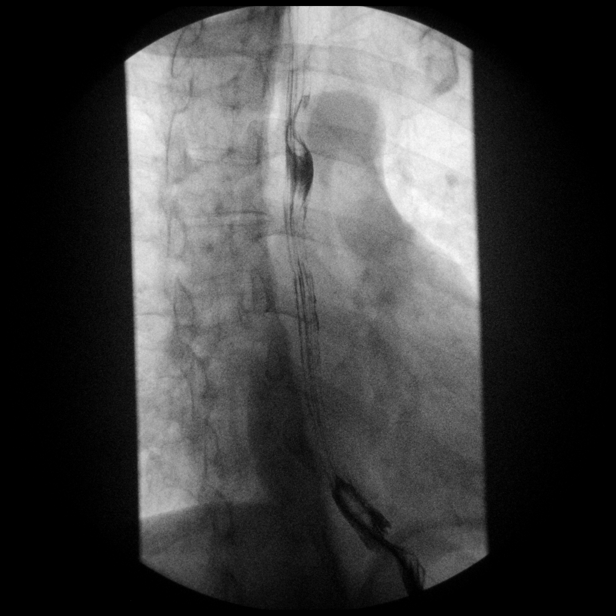

[Series 6: run · 1 of 1 slices shown (4 of 15)]
[im 1/1]
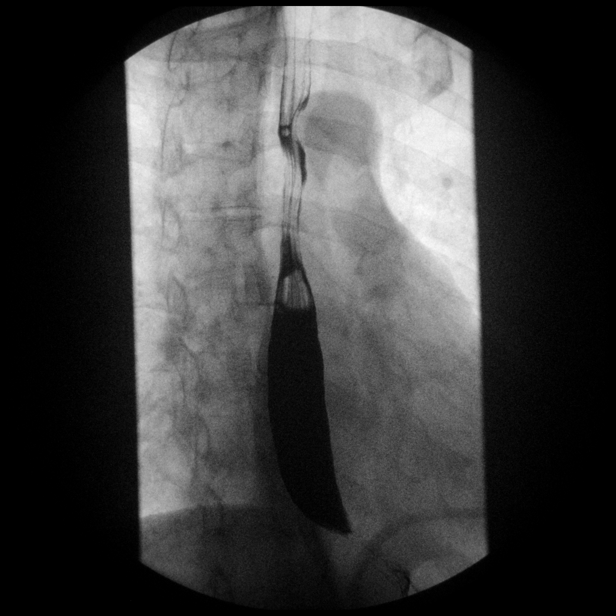

[Series 8: run · 1 of 1 slices shown (5 of 15)]
[im 1/1]
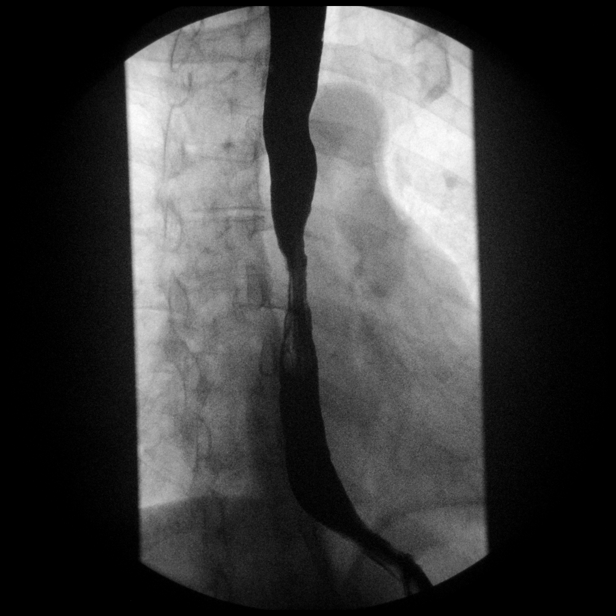

[Series 9: run · 1 of 1 slices shown (6 of 15)]
[im 1/1]
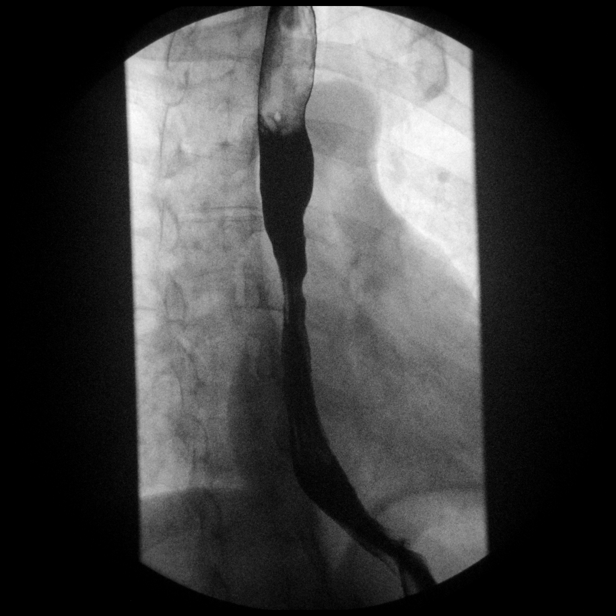

[Series 11: run · 1 of 4 slices shown (7 of 15)]
[im 1/4]
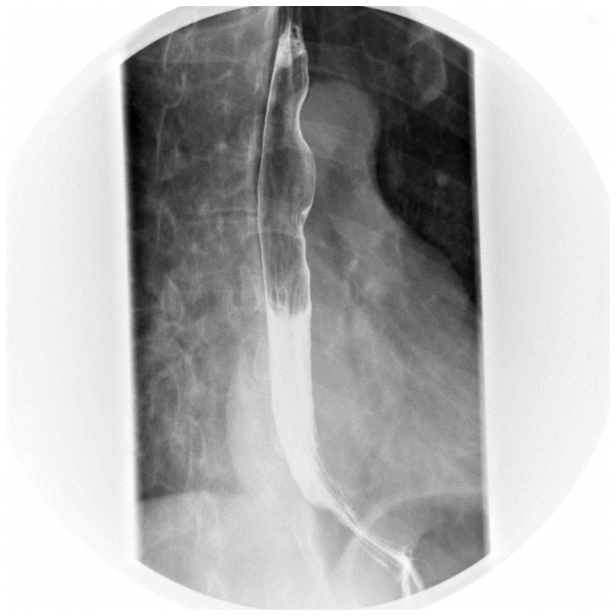

[Series 13: run · 1 of 1 slices shown (8 of 15)]
[im 1/1]
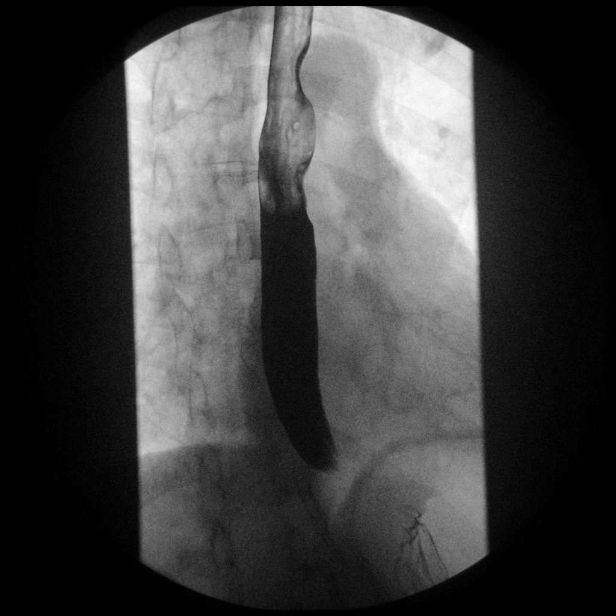

[Series 14: run · 1 of 1 slices shown (9 of 15)]
[im 1/1]
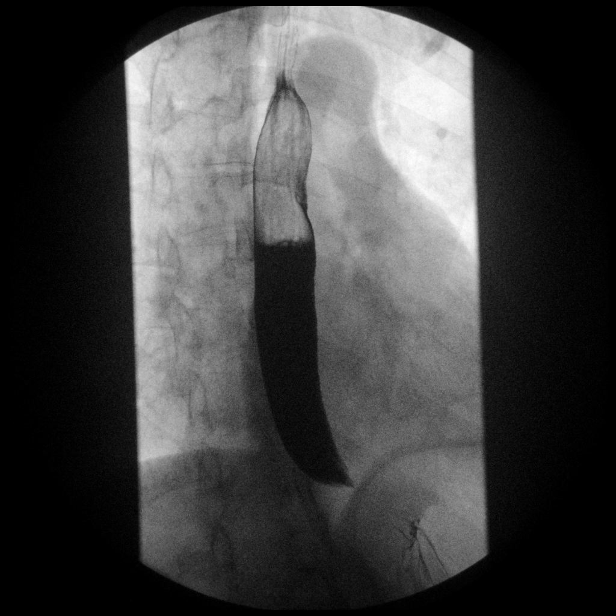

[Series 16: run · 1 of 1 slices shown (10 of 15)]
[im 1/1]
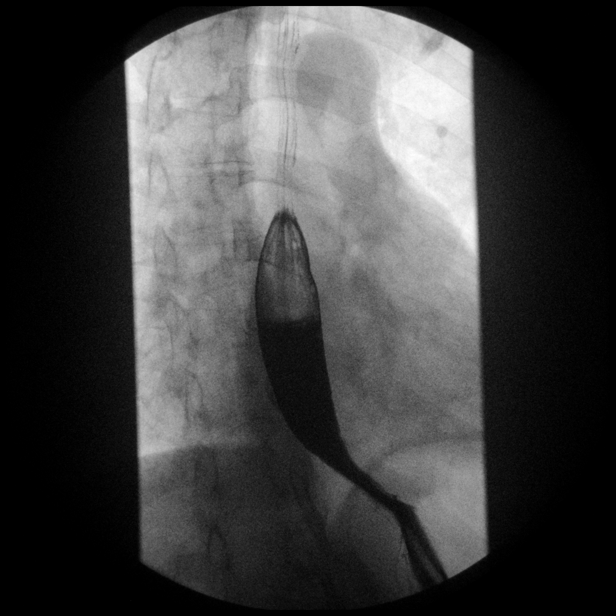

[Series 17: run · 1 of 1 slices shown (11 of 15)]
[im 1/1]
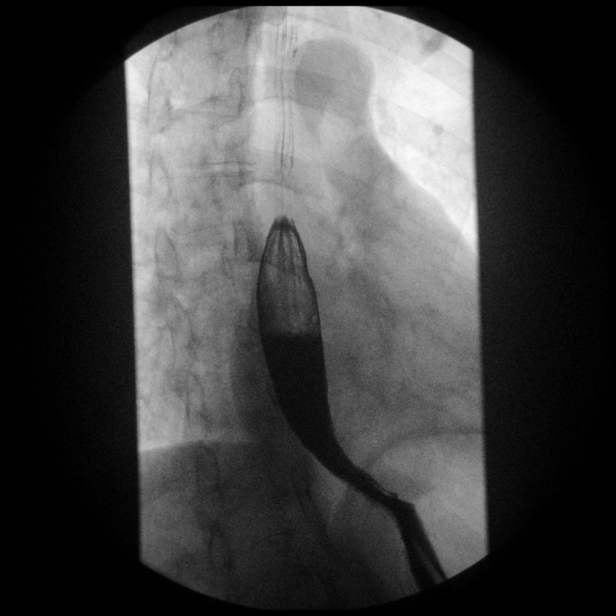

[Series 19: run · 1 of 1 slices shown (12 of 15)]
[im 1/1]
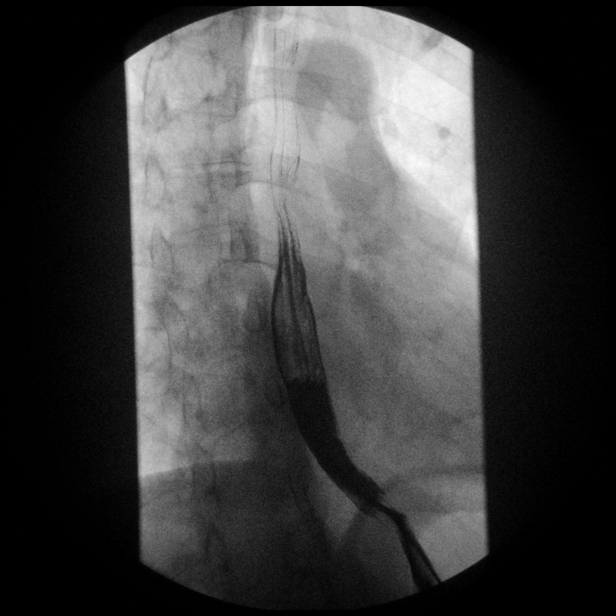

[Series 21: run · 1 of 1 slices shown (13 of 15)]
[im 1/1]
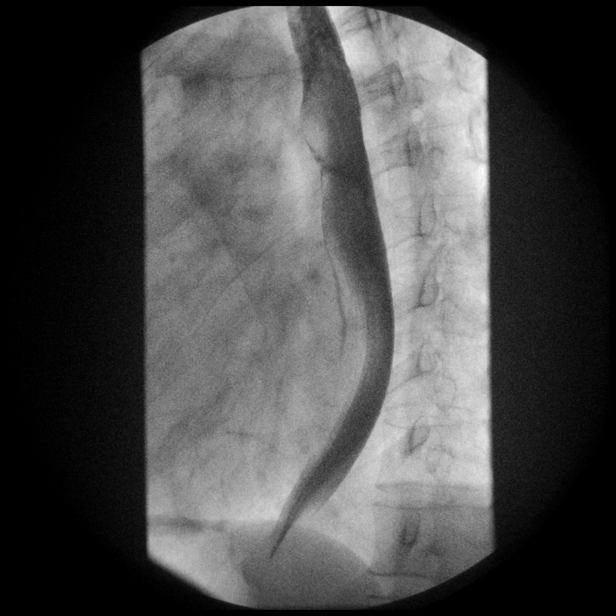

[Series 22: run · 1 of 1 slices shown (14 of 15)]
[im 1/1]
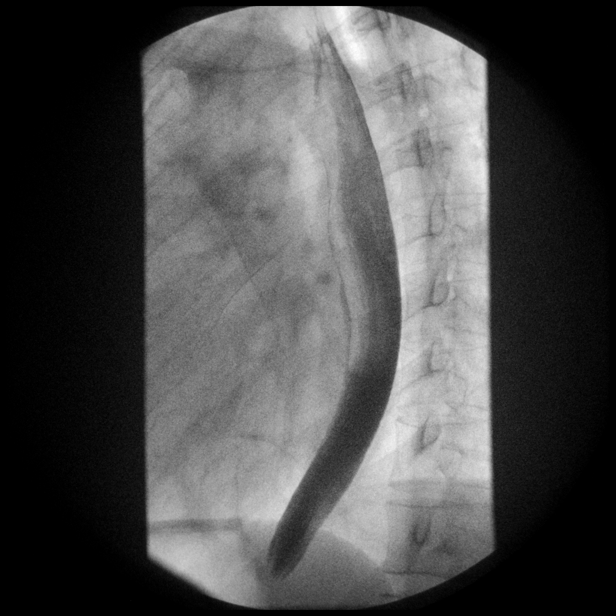

[Series 24: run · 1 of 1 slices shown (15 of 15)]
[im 1/1]
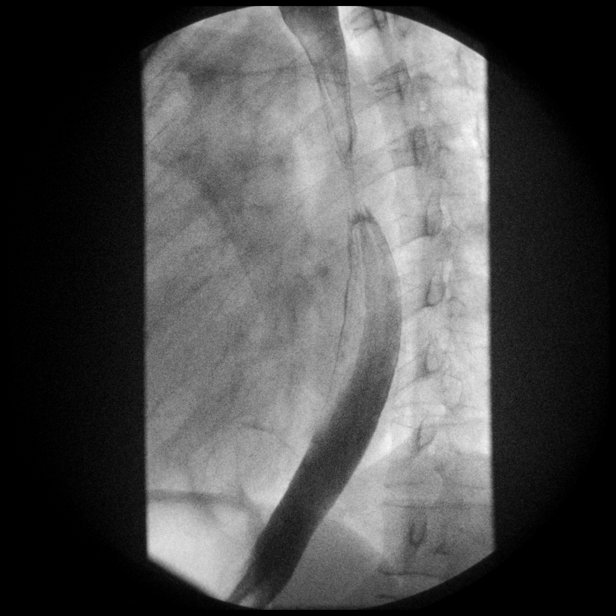

[15 of 24 positions shown; findings below may reference images not displayed]

FINDINGS: Fluoroscopic evaluation of swallowing shows normal
primary esophageal peristaltic waves.  No stricture, fold
thickening or mass.  No reflux with water siphon maneuver.  The
patient swallowed a 13 mm barium tablet which freely passed into
the stomach.
IMPRESSION: Unremarkable barium swallow.

## 2011-07-22 IMAGING — CT CT CHEST W/O CM
2 of 3 series · 15 of 36 positions shown, 18 images · non-contrast
Comparison: CT chest 03/05/2011.

CLINICAL DATA: Follow-up apical lung lesion.

CT CHEST WITHOUT CONTRAST
TECHNIQUE: Multidetector CT imaging of the chest was performed
following the standard protocol without IV contrast.

[Series 2: routine chest 5.0 st · axial · 0.56mm/px · z∈[-238,-13]mm · 12 of 53 slices shown, 15 images]
[im 4/53  mediastinal]
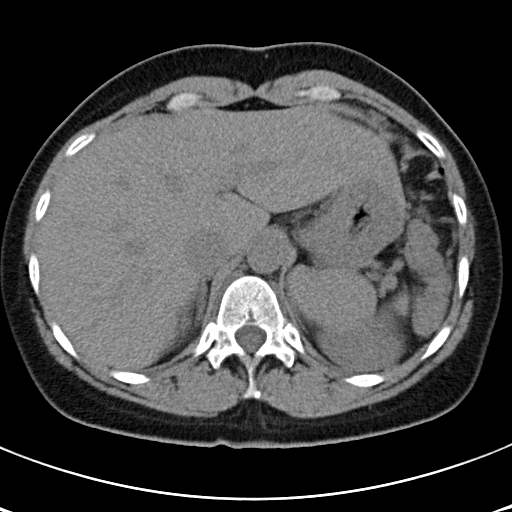
[im 4/53  lung]
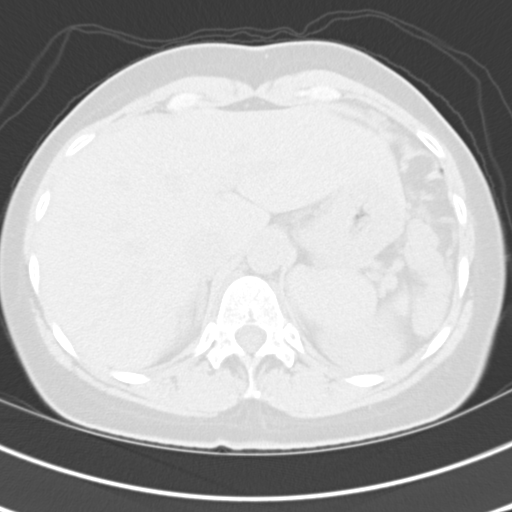
[im 8/53  lung]
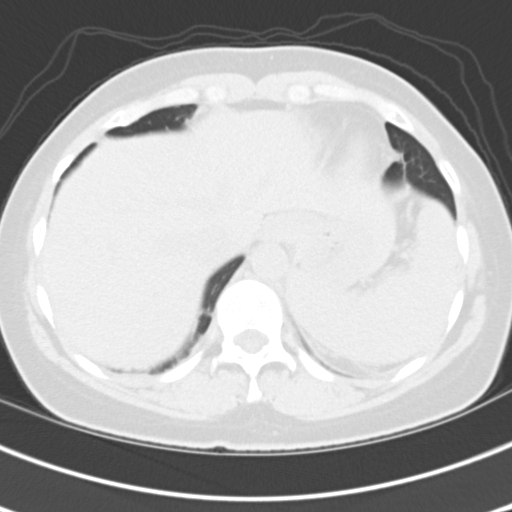
[im 12/53  lung]
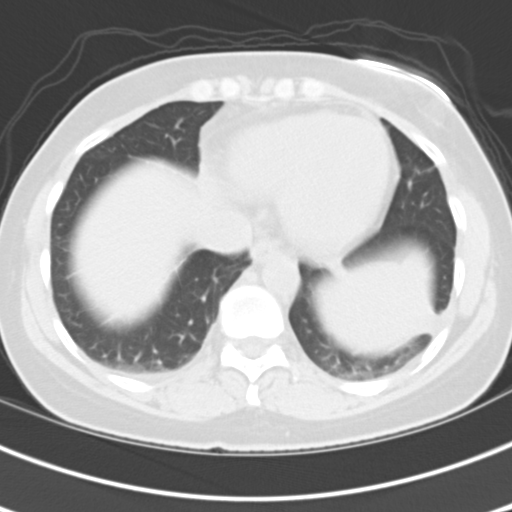
[im 16/53  lung]
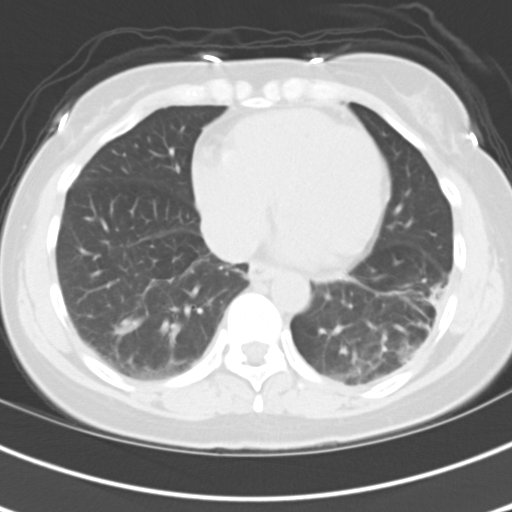
[im 20/53  mediastinal]
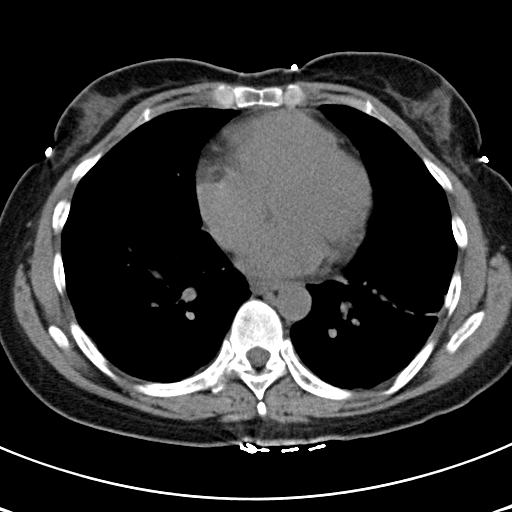
[im 20/53  lung]
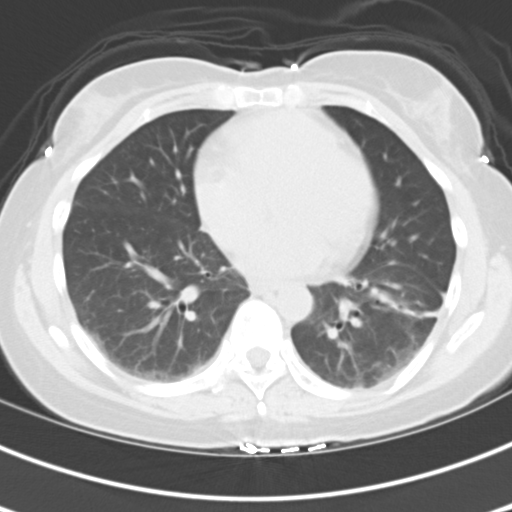
[im 24/53  lung]
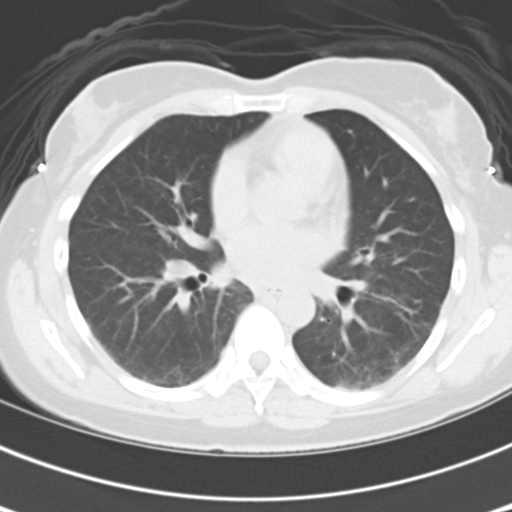
[im 29/53  lung]
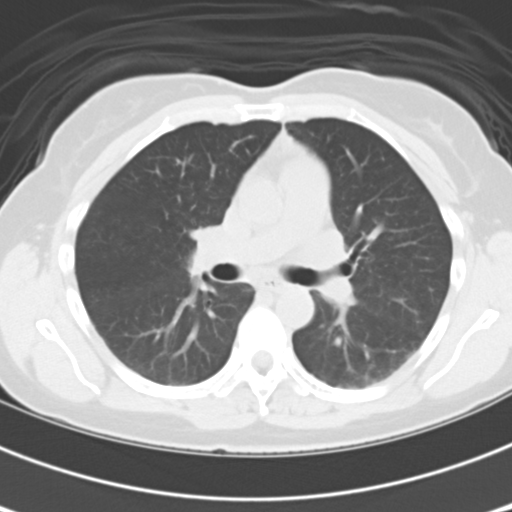
[im 33/53  lung]
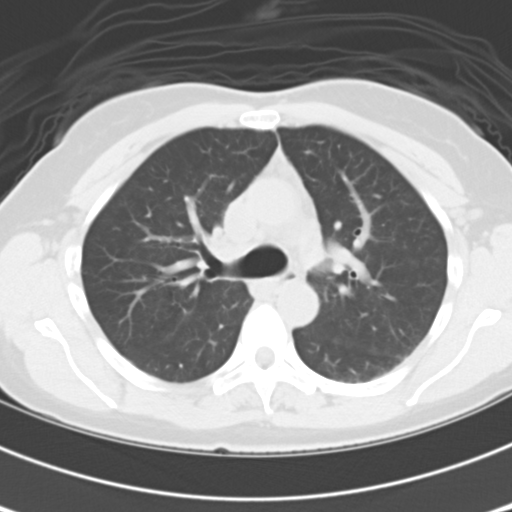
[im 37/53  mediastinal]
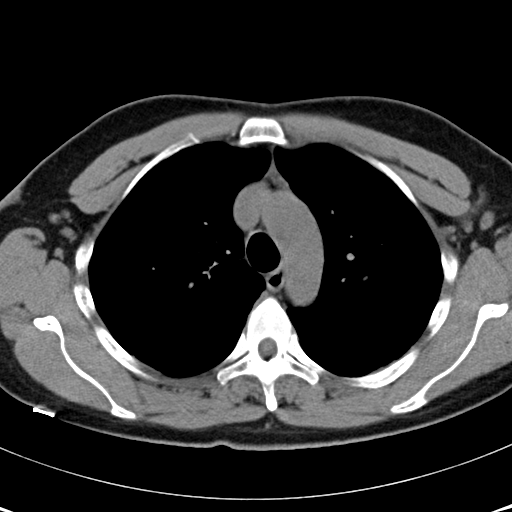
[im 37/53  lung]
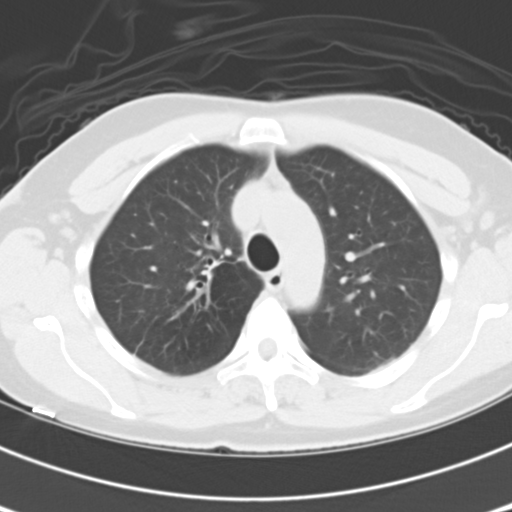
[im 41/53  lung]
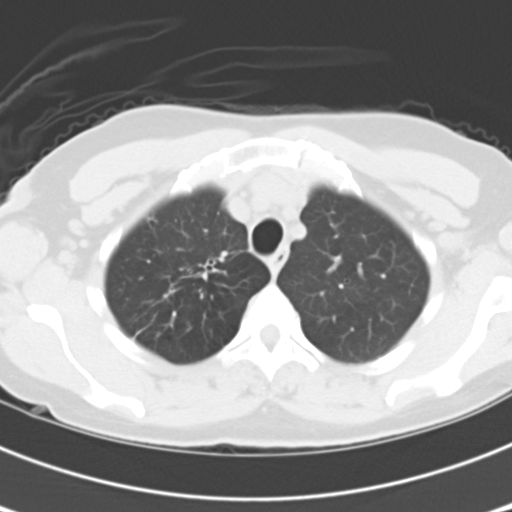
[im 45/53  lung]
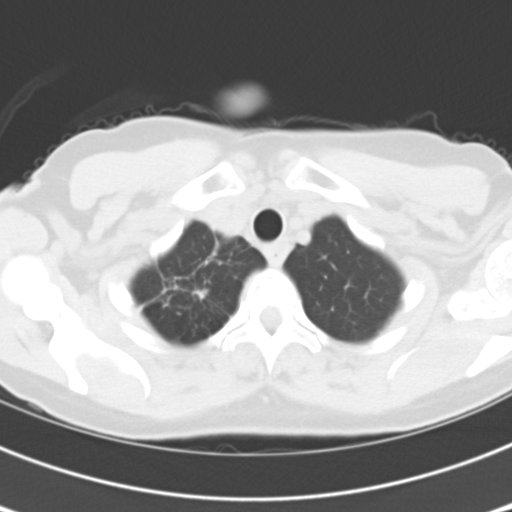
[im 49/53  lung]
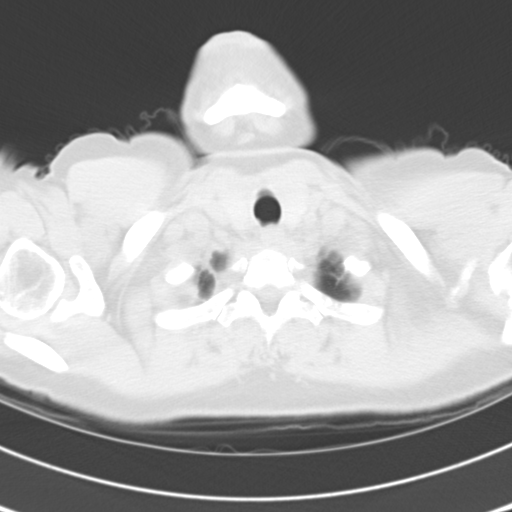

[Series 7: coronals · coronal · 0.65mm/px · 3 of 108 slices shown]
[im 22/108  lung]
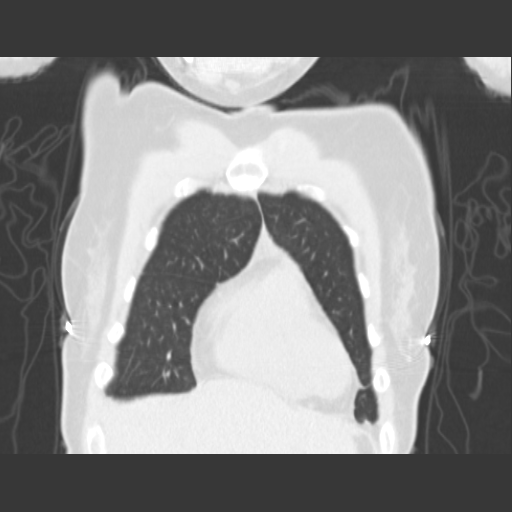
[im 43/108  lung]
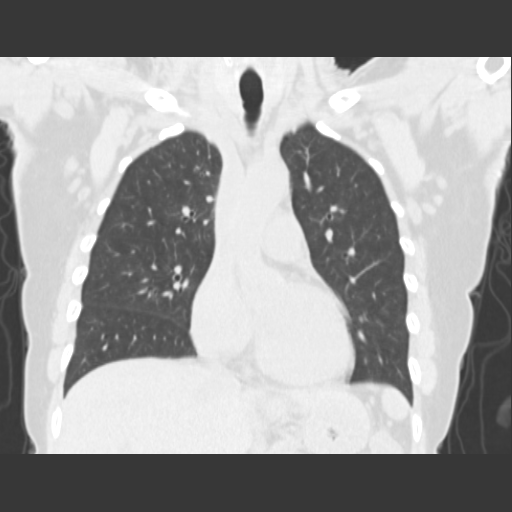
[im 65/108  lung]
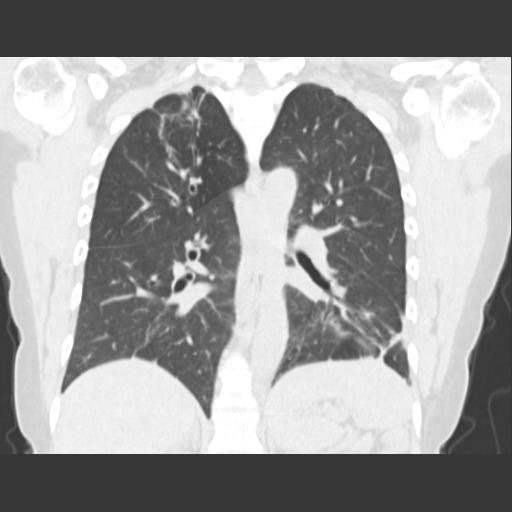

[15 of 36 positions shown; findings below may reference images not displayed]

FINDINGS: The chest wall is unremarkable and stable.  No breast
masses, supraclavicular or axillary lymphadenopathy.  There are
small stable axillary lymph nodes bilaterally.  The bony thorax is
intact.

The heart is normal in size.  No pericardial effusion.  No
mediastinal or hilar lymphadenopathy.  The aorta is normal in
caliber.  The esophagus is grossly normal.

Examination of the lung parenchyma demonstrates a stable right
apical lung density measuring 8.0 x 5.5 mm.  This is surrounded by
linear scarring type changes.  The lungs are clear except for
dependent bibasilar subsegmental atelectasis.  Left basilar
scarring changes are also noted.  No worrisome pulmonary nodules or
mass lesions.  No acute pulmonary findings.  The tracheobronchial
tree appears normal.

The upper abdomen is unremarkable.
IMPRESSION: 1.  Stable right upper lobe scarring changes and mild
bronchiectasis.  The nodular density is unchanged since 2333 CT
scans.  No further imaging evaluation/follow-up is necessary.
Stable left basilar scarring changes also.
2.  No acute pulmonary findings.
3.  No mediastinal or hilar lymphadenopathy.

## 2011-07-27 ENCOUNTER — Encounter: Payer: Self-pay | Admitting: Internal Medicine

## 2011-08-04 ENCOUNTER — Other Ambulatory Visit (HOSPITAL_COMMUNITY): Payer: Medicaid Other

## 2011-09-01 ENCOUNTER — Other Ambulatory Visit: Payer: Self-pay | Admitting: *Deleted

## 2011-09-01 DIAGNOSIS — E785 Hyperlipidemia, unspecified: Secondary | ICD-10-CM

## 2011-09-01 DIAGNOSIS — Z5181 Encounter for therapeutic drug level monitoring: Secondary | ICD-10-CM

## 2011-09-01 DIAGNOSIS — I2699 Other pulmonary embolism without acute cor pulmonale: Secondary | ICD-10-CM

## 2011-09-01 DIAGNOSIS — Z7901 Long term (current) use of anticoagulants: Secondary | ICD-10-CM

## 2011-09-01 MED ORDER — PRAVASTATIN SODIUM 20 MG PO TABS: 20.0000 mg | ORAL_TABLET | Freq: Every day | ORAL | Status: DC

## 2011-09-01 MED ORDER — OMEPRAZOLE 20 MG PO TBEC: 20.0000 mg | DELAYED_RELEASE_TABLET | Freq: Every day | ORAL | Status: DC

## 2011-09-06 ENCOUNTER — Ambulatory Visit (INDEPENDENT_AMBULATORY_CARE_PROVIDER_SITE_OTHER): Payer: Medicaid Other | Admitting: Internal Medicine

## 2011-09-06 ENCOUNTER — Ambulatory Visit (INDEPENDENT_AMBULATORY_CARE_PROVIDER_SITE_OTHER): Payer: Medicaid Other | Admitting: Pharmacist

## 2011-09-06 ENCOUNTER — Encounter: Payer: Self-pay | Admitting: Internal Medicine

## 2011-09-06 DIAGNOSIS — Z7901 Long term (current) use of anticoagulants: Secondary | ICD-10-CM

## 2011-09-06 DIAGNOSIS — G4730 Sleep apnea, unspecified: Secondary | ICD-10-CM

## 2011-09-06 DIAGNOSIS — R079 Chest pain, unspecified: Secondary | ICD-10-CM

## 2011-09-06 DIAGNOSIS — K5909 Other constipation: Secondary | ICD-10-CM | POA: Insufficient documentation

## 2011-09-06 DIAGNOSIS — E559 Vitamin D deficiency, unspecified: Secondary | ICD-10-CM | POA: Insufficient documentation

## 2011-09-06 DIAGNOSIS — F419 Anxiety disorder, unspecified: Secondary | ICD-10-CM | POA: Insufficient documentation

## 2011-09-06 DIAGNOSIS — K5900 Constipation, unspecified: Secondary | ICD-10-CM

## 2011-09-06 DIAGNOSIS — F329 Major depressive disorder, single episode, unspecified: Secondary | ICD-10-CM

## 2011-09-06 DIAGNOSIS — Z720 Tobacco use: Secondary | ICD-10-CM

## 2011-09-06 DIAGNOSIS — E785 Hyperlipidemia, unspecified: Secondary | ICD-10-CM

## 2011-09-06 DIAGNOSIS — F17200 Nicotine dependence, unspecified, uncomplicated: Secondary | ICD-10-CM

## 2011-09-06 DIAGNOSIS — I2699 Other pulmonary embolism without acute cor pulmonale: Secondary | ICD-10-CM

## 2011-09-06 DIAGNOSIS — Z5181 Encounter for therapeutic drug level monitoring: Secondary | ICD-10-CM

## 2011-09-06 DIAGNOSIS — F411 Generalized anxiety disorder: Secondary | ICD-10-CM

## 2011-09-06 DIAGNOSIS — G4700 Insomnia, unspecified: Secondary | ICD-10-CM

## 2011-09-06 LAB — COMPREHENSIVE METABOLIC PANEL
ALT: 31 U/L (ref 0–35)
AST: 26 U/L (ref 0–37)
Albumin: 4.4 g/dL (ref 3.5–5.2)
CO2: 26 mEq/L (ref 19–32)
Calcium: 9 mg/dL (ref 8.4–10.5)
Chloride: 107 mEq/L (ref 96–112)
Potassium: 3.8 mEq/L (ref 3.5–5.3)
Sodium: 140 mEq/L (ref 135–145)
Total Protein: 7.5 g/dL (ref 6.0–8.3)

## 2011-09-06 MED ORDER — POLYETHYLENE GLYCOL 3350 17 G PO PACK: 17.0000 g | PACK | Freq: Every day | ORAL | Status: DC

## 2011-09-06 MED ORDER — ALPRAZOLAM 0.25 MG PO TABS: 0.2500 mg | ORAL_TABLET | Freq: Every day | ORAL | Status: DC | PRN

## 2011-09-06 MED ORDER — MIRTAZAPINE 15 MG PO TABS: 15.0000 mg | ORAL_TABLET | Freq: Every day | ORAL | Status: DC

## 2011-09-06 MED ORDER — PRAVASTATIN SODIUM 20 MG PO TABS: 20.0000 mg | ORAL_TABLET | Freq: Every day | ORAL | Status: DC

## 2011-09-06 MED ORDER — ZOLPIDEM TARTRATE 10 MG PO TABS: 10.0000 mg | ORAL_TABLET | Freq: Every evening | ORAL | Status: DC | PRN

## 2011-09-06 NOTE — Progress Notes (Signed)
Subjective:   Patient ID: Emily Cameron female   DOB: [birth year] 51 y.o.   MRN: [id number]  HPI: [name] is a 51 y.o. female with past medical history significant as outlined below who presented to the clinic for a regular office visit. 1. Chest pain: Patient continues to experience chest pain which is thought in character. She reports that it is persistent every single day worse in the morning not associated with shortness of breath or dizziness. The pain has been present for almost one year. No improvement with PPI. 2.  Insomnia: Patient has long history of sleep trouble. She does not go to bed until 2:30 in the morning and then wakes up early. She watches TV in bed. Denies any smoking, bleeding, drinking alcohol in the bed. Furthermore she does not drink any coffee. She states she has been using Remeron and Ambien and trazodone without any significant improvement 3. Anxiety: The patient reports she has  episodes of anxiety attacks during the day whenever she is alone . She has been using Xanax in the past which has helped. Has not been seen by a psychiatrist or counseling. 4. patient has gained weight since last year. 5. Tobacco: Quit smoking 5 cigarettes a day 6. Alcohol: Drinks only over the weekend    Past Medical History  Diagnosis Date  . Anxiety   . Depression   . Insomnia   . Weight loss     due to anxiety  . PE (pulmonary embolism) 2006  . Alcohol abuse   . Polysubstance abuse     Current smoking and alcohol. 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 Marijuna not taking since 7 month.    Current Outpatient Prescriptions  Medication Sig Dispense Refill  . benzonatate (TESSALON) 100 MG capsule Take 1 capsule (100 mg total) by mouth 3 (three) times daily as needed. For cough  30 capsule  3  . calcium citrate-vitamin D (CITRACAL+D) 315-200 MG-UNIT per tablet Take 1 tablet by mouth daily.  100 tablet  3  . cyclobenzaprine (FLEXERIL) 5 MG tablet Take 1 tablet  (5 mg total) by mouth every 8 (eight) hours as needed for muscle spasms.  30 tablet  0  . mirtazapine (REMERON) 15 MG tablet Take 1 tablet (15 mg total) by mouth at bedtime.  30 tablet  3  . Omeprazole 20 MG TBEC Take 1 tablet (20 mg total) by mouth daily.  30 each  3  . polyethylene glycol (MIRALAX / GLYCOLAX) packet Take 17 g by mouth daily.        . pravastatin (PRAVACHOL) 20 MG tablet Take 1 tablet (20 mg total) by mouth daily. To decrease cholesterol  30 tablet  1  . warfarin (COUMADIN) 5 MG tablet Take as directed by anticoagulation clinic provider.  30 tablet  2  . zolpidem (AMBIEN) 5 MG tablet Take 1 tablet (5 mg total) by mouth at bedtime as needed for sleep.  30 tablet  0   Review of Systems: Constitutional: Denies fever, chills, diaphoresis, appetite change and fatigue.  Respiratory: Denies SOB, DOE, cough, chest tightness,  and wheezing.   Cardiovascular: Denies  palpitations and leg swelling.  Gastrointestinal: Denies nausea, vomiting, abdominal pain, diarrhea, constipation, Genitourinary: Denies dysuria, urgency, frequency, hematuria, flank pain and difficulty urinating.  Skin: Denies pallor, rash and wound.  Neurological: Denies dizziness, light-headedness,  Psychiatric/Behavioral: Denies suicidal ideation, but noted , nervousness, sleep disturbance   Objective:  Physical Exam: Filed Vitals:   09/06/11  1329  BP: 106/68  Pulse: 66  Temp: 97.2 F (36.2 C)  TempSrc: Oral  Height: 5\' 3"  (1.6 m)  Weight: 139 lb 3.2 oz (63.141 kg)   Constitutional: Vital signs reviewed.  Patient is a well-developed and well-nourished  in no acute distress and cooperative with exam. Alert and oriented x3.  Neck: Supple,  Cardiovascular: RRR, S1 normal, S2 normal, no MRG, pulses symmetric and intact bilaterally. Mild tenderness on chest wall with palpation Pulmonary/Chest: CTAB, no wheezes, rales, or rhonchi Abdominal: Soft. Non-tender, non-distended, bowel sounds are normal,  GU: no CVA  tenderness Neurological: A&O x3, no focal motor deficit, sensory intact to light touch bilaterally.  Skin: Warm, dry and intact. No rash, cyanosis, or clubbing.

## 2011-09-06 NOTE — Assessment & Plan Note (Signed)
Daughter was present during office visit and reports episodes of apnea during the night. For patient for sleep study for possible changes in management.

## 2011-09-06 NOTE — Assessment & Plan Note (Signed)
Patient is noted to have severe insomnia. Discussed about sleep hygiene and provided hand out. Furthermore recommended to continue to Remeron increased Ambien to 10 mg as needed for sleep. I recommended to discontinue trazodone at this point. I emphasized the importance about sleep hygiene multiple times.

## 2011-09-06 NOTE — Assessment & Plan Note (Signed)
Patient has history of anxiety. Is currently using Remeron only at bedtime and has been using Xanax when necessary during the day. I have provided patient a one-month supply only and will not refill the patient is seen by psychiatrist or  counseling. Patient was provided information on, family practice [place] and referred to social worker was placed on to home is to establish care with [facility].

## 2011-09-06 NOTE — Assessment & Plan Note (Signed)
Most likely musculoskeletal. Recommended to use Flexeril and to do exercise. Other differential diagnoses include pleuritic chest pain history on PE and scarring changes noted on the CT chest in November 2012. We'll continue to monitor at this point.

## 2011-09-06 NOTE — Patient Instructions (Addendum)
Please exercise at least 3 times a week.

## 2011-09-06 NOTE — Assessment & Plan Note (Signed)
Recommended diet changes and some MiraLax which he has used in the past.

## 2011-09-06 NOTE — Assessment & Plan Note (Addendum)
Currently smoking 5 cigaretts a day. Patient had has cut down but continues to smoke. She was counseled about the importance to quit smoking. The patient was she's not yet ready to let us know to get help.

## 2011-09-07 LAB — VITAMIN D 25 HYDROXY (VIT D DEFICIENCY, FRACTURES): Vit D, 25-Hydroxy: 15 ng/mL — ABNORMAL LOW (ref 30–89)

## 2011-09-07 NOTE — Patient Instructions (Signed)
Patient instructed to take medications as defined in the Anti-coagulation Track section of this encounter.  Patient instructed to take today's dose.  Patient verbalized understanding of these instructions.    

## 2011-09-07 NOTE — Progress Notes (Signed)
Anti-Coagulation Progress Note  Emily Cameron is a 51 y.o. female who is currently on an anti-coagulation regimen.    RECENT RESULTS: Recent results are below, the most recent result is correlated with a dose of 32.5 mg. per week: Lab Results  Component Value Date   INR 2.10 09/06/2011   INR 3.2 07/19/2011   INR 1.80 06/21/2011    ANTI-COAG DOSE:   Latest dosing instructions   Total Sun Mon Tue Wed Thu Fri Sat   35 5 mg 5 mg 5 mg 5 mg 5 mg 5 mg 5 mg    (5 mg1) (5 mg1) (5 mg1) (5 mg1) (5 mg1) (5 mg1) (5 mg1)         ANTICOAG SUMMARY: Anticoagulation Episode Summary              Current INR goal 2.0-3.0 Next INR check 10/04/2011   INR from last check 2.10 (09/06/2011) Most recent INR 2.10 (09/07/2011)   Weekly max dose (mg)  Target end date Indefinite   Indications Pulmonary embolism, Monitoring for long-term anticoagulant use   [facility] location [facility]    Send INR reminders to    Comments 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             ANTICOAG TODAY: Anticoagulation Summary as of 09/06/2011              INR goal 2.0-3.0     Selected INR 2.10 (09/06/2011) Next INR check 10/04/2011   Weekly max dose (mg)  Target end date Indefinite   Indications Pulmonary embolism, Monitoring for long-term anticoagulant use    Anticoagulation Episode Summary              INR check location [facility] lab    Send INR reminders to    Comments 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             PATIENT INSTRUCTIONS: Patient Instructions  Patient instructed to take medications as defined in the Anti-coagulation Track section of this encounter.  Patient instructed to take today's dose.  Patient verbalized understanding of these instructions.        FOLLOW-UP Return in 4 weeks (on 10/04/2011) for Follow up INR.  Hulen Luster, III Pharm.D., CACP

## 2011-09-08 ENCOUNTER — Other Ambulatory Visit: Payer: Self-pay | Admitting: Internal Medicine

## 2011-09-08 MED ORDER — CHOLECALCIFEROL 1.25 MG (50000 UT) PO TABS: 1.0000 | ORAL_TABLET | ORAL | Status: DC

## 2011-09-08 NOTE — Progress Notes (Signed)
Split sleep night visit sch 09/23/11 8:30PM - tp reach pt at [facility] [phone]. Stanton Kidney Remedios Mckone RN 09/07/11 1PM

## 2011-09-10 ENCOUNTER — Telehealth: Payer: Self-pay | Admitting: Licensed Clinical Social Worker

## 2011-09-10 NOTE — Telephone Encounter (Signed)
Left message on 1/17 and again today.   09/10/11  Koleen Distance

## 2011-09-13 NOTE — Telephone Encounter (Signed)
Mailed [facility] information on Friday.  Emily Cameron

## 2011-09-15 ENCOUNTER — Ambulatory Visit (HOSPITAL_COMMUNITY): Admission: RE | Admit: 2011-09-15 | Payer: Medicaid Other | Source: Ambulatory Visit

## 2011-09-23 ENCOUNTER — Ambulatory Visit (HOSPITAL_BASED_OUTPATIENT_CLINIC_OR_DEPARTMENT_OTHER): Payer: Medicaid Other | Attending: Internal Medicine | Admitting: Radiology

## 2011-09-23 VITALS — Ht 63.0 in | Wt 140.0 lb

## 2011-09-23 DIAGNOSIS — Z79899 Other long term (current) drug therapy: Secondary | ICD-10-CM | POA: Insufficient documentation

## 2011-09-23 DIAGNOSIS — R0989 Other specified symptoms and signs involving the circulatory and respiratory systems: Secondary | ICD-10-CM | POA: Insufficient documentation

## 2011-09-23 DIAGNOSIS — R0609 Other forms of dyspnea: Secondary | ICD-10-CM | POA: Insufficient documentation

## 2011-09-23 DIAGNOSIS — G4730 Sleep apnea, unspecified: Secondary | ICD-10-CM

## 2011-09-23 DIAGNOSIS — G4710 Hypersomnia, unspecified: Secondary | ICD-10-CM | POA: Insufficient documentation

## 2011-10-03 DIAGNOSIS — Z79899 Other long term (current) drug therapy: Secondary | ICD-10-CM

## 2011-10-03 DIAGNOSIS — R0609 Other forms of dyspnea: Secondary | ICD-10-CM

## 2011-10-03 DIAGNOSIS — G4730 Sleep apnea, unspecified: Secondary | ICD-10-CM

## 2011-10-03 DIAGNOSIS — R0989 Other specified symptoms and signs involving the circulatory and respiratory systems: Secondary | ICD-10-CM

## 2011-10-03 DIAGNOSIS — G4710 Hypersomnia, unspecified: Secondary | ICD-10-CM

## 2011-10-04 ENCOUNTER — Ambulatory Visit (INDEPENDENT_AMBULATORY_CARE_PROVIDER_SITE_OTHER): Payer: Medicaid Other | Admitting: Pharmacist

## 2011-10-04 DIAGNOSIS — Z7901 Long term (current) use of anticoagulants: Secondary | ICD-10-CM

## 2011-10-04 DIAGNOSIS — I2699 Other pulmonary embolism without acute cor pulmonale: Secondary | ICD-10-CM

## 2011-10-04 NOTE — Procedures (Signed)
NAME:  REVIA, NGHIEM NO.:  [id number]  MEDICAL RECORD NO.:  [id number]          PATIENT TYPE:  OUT  LOCATION:  SLEEP CENTER                 FACILITY:  [facility]  PHYSICIAN:  Danea Manter D. Maple Hudson, MD, FCCP, FACPDATE OF BIRTH:  [birth year] 08, 1961  DATE OF STUDY:  09/23/2011                           NOCTURNAL POLYSOMNOGRAM  REFERRING PHYSICIAN:  Almyra Deforest, MD  REFERRING DOCTOR:  Almyra Deforest, MD.  INDICATION FOR STUDY:  Hypersomnia with sleep apnea.  EPWORTH SLEEPINESS SCORE:  2/24.  BMI 24.8, weight 140 pounds, height 63 inches, neck 13 inches.  MEDICATIONS:  Home medications were charted and reviewed.  SLEEP ARCHITECTURE:  Total sleep time 376 minutes with sleep efficiency 93.4%.  Stage I 8.5%.  Stage II 74.2%.  Stage III absent.  REM 17.3% of total sleep time.  Sleep latency 6 minutes.  REM latency 137 minutes. Awake after sleep onset 20 minutes.  Arousal index 17.2.  BEDTIME MEDICATION: 1. Alprazolam. 2. Zolpidem. 3. Mirtazapine.  RESPIRATORY DATA:  Apnea-hypopnea index (AHI) 0.2 per hour.  A single event was recorded as an obstructive apnea while sleeping supine in non- REM sleep.  There were insufficient numbers of events to permit application of split protocol, CPAP titration on this study night.  OXYGEN DATA:  Mild snoring with oxygen desaturation to a nadir of 87% and a mean oxygen saturation through the study of 95.1% on room air.  CARDIAC DATA:  Sinus rhythm with PACs.  MOVEMENT-PARASOMNIA:  A few incidental limb jerks were noted with insignificant effect on sleep.  No bathroom trips.  IMPRESSION-RECOMMENDATIONS: 1. Unremarkable sleep architecture for sleep center environment.     There were several nonspecific spontaneous brief wakings during the     night.  Video review indicated some frequent shifting of body     position with movement of arms and legs, but nonspecific and with     no significant movement- associated sleep disturbance. 2. A  single respiratory event with sleep disturbance was noted, AHI     0.2 per hour, within normal limits.  The     normal range for adult is from 0-5 events per hour.  Mild snoring     with oxygen desaturation to a nadir of 87% and a mean oxygen     saturation through the study of 95.1% on room air.     Kadedra Vanaken D. Maple Hudson, MD, [facility], FACP Diplomate, Biomedical engineer of Sleep Medicine Electronically Signed    CDY/MEDQ  D:  10/03/2011 16:10:96  T:  10/04/2011 05:39:52  Job:  [id number]

## 2011-10-04 NOTE — Patient Instructions (Signed)
Patient instructed to take medications as defined in the Anti-coagulation Track section of this encounter.  Patient instructed to take today's dose.  Patient verbalized understanding of these instructions.    

## 2011-10-04 NOTE — Progress Notes (Signed)
Anti-Coagulation Progress Note  Emily Cameron is a 51 y.o. female who is currently on an anti-coagulation regimen.    RECENT RESULTS: Recent results are below, the most recent result is correlated with a dose of 35 mg. per week: Lab Results  Component Value Date   INR 1.10 10/04/2011   INR 2.10 09/06/2011   INR 3.2 07/19/2011    ANTI-COAG DOSE:   Latest dosing instructions   Total Sun Mon Tue Wed Thu Fri Sat   42.5 5 mg 7.5 mg 5 mg 7.5 mg 5 mg 7.5 mg 5 mg    (5 mg1) (5 mg1.5) (5 mg1) (5 mg1.5) (5 mg1) (5 mg1.5) (5 mg1)         ANTICOAG SUMMARY: Anticoagulation Episode Summary              Current INR goal 2.0-3.0 Next INR check 10/18/2011   INR from last check 1.10! 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             ANTICOAG TODAY: Anticoagulation Summary as of 10/04/2011              INR goal 2.0-3.0     Selected INR 1.10! 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             PATIENT INSTRUCTIONS: Patient Instructions  Patient instructed to take medications as defined in the Anti-coagulation Track section of this encounter.  Patient instructed to take today's dose.  Patient verbalized understanding of these instructions.        FOLLOW-UP Return in 2 weeks (on 10/18/2011) for Follow up INR.  Hulen Luster, III Pharm.D., CACP

## 2011-10-11 ENCOUNTER — Encounter: Payer: Medicaid Other | Admitting: Internal Medicine

## 2011-10-11 ENCOUNTER — Encounter: Payer: Self-pay | Admitting: Internal Medicine

## 2011-10-18 ENCOUNTER — Encounter: Payer: Self-pay | Admitting: Internal Medicine

## 2011-10-18 ENCOUNTER — Ambulatory Visit (INDEPENDENT_AMBULATORY_CARE_PROVIDER_SITE_OTHER): Payer: Medicaid Other | Admitting: Pharmacist

## 2011-10-18 ENCOUNTER — Ambulatory Visit (INDEPENDENT_AMBULATORY_CARE_PROVIDER_SITE_OTHER): Payer: Medicaid Other | Admitting: Internal Medicine

## 2011-10-18 DIAGNOSIS — Z5181 Encounter for therapeutic drug level monitoring: Secondary | ICD-10-CM

## 2011-10-18 DIAGNOSIS — F411 Generalized anxiety disorder: Secondary | ICD-10-CM

## 2011-10-18 DIAGNOSIS — F419 Anxiety disorder, unspecified: Secondary | ICD-10-CM

## 2011-10-18 DIAGNOSIS — Z7901 Long term (current) use of anticoagulants: Secondary | ICD-10-CM

## 2011-10-18 DIAGNOSIS — I2699 Other pulmonary embolism without acute cor pulmonale: Secondary | ICD-10-CM

## 2011-10-18 DIAGNOSIS — G4730 Sleep apnea, unspecified: Secondary | ICD-10-CM

## 2011-10-18 DIAGNOSIS — E785 Hyperlipidemia, unspecified: Secondary | ICD-10-CM

## 2011-10-18 DIAGNOSIS — G4700 Insomnia, unspecified: Secondary | ICD-10-CM

## 2011-10-18 MED ORDER — PRAVASTATIN SODIUM 20 MG PO TABS: 20.0000 mg | ORAL_TABLET | Freq: Every day | ORAL | Status: DC

## 2011-10-18 MED ORDER — ZOLPIDEM TARTRATE 10 MG PO TABS: 10.0000 mg | ORAL_TABLET | Freq: Every evening | ORAL | Status: DC | PRN

## 2011-10-18 MED ORDER — OMEPRAZOLE 20 MG PO TBEC: 20.0000 mg | DELAYED_RELEASE_TABLET | Freq: Every day | ORAL | Status: DC

## 2011-10-18 MED ORDER — ALPRAZOLAM 0.25 MG PO TABS: 0.2500 mg | ORAL_TABLET | Freq: Every day | ORAL | Status: DC | PRN

## 2011-10-18 NOTE — Assessment & Plan Note (Signed)
Patient informed about the sleep study results. No significant desaturation overnight requiring CPAP

## 2011-10-18 NOTE — Patient Instructions (Signed)
Patient instructed to take medications as defined in the Anti-coagulation Track section of this encounter.  Patient instructed to take today's dose.  Patient verbalized understanding of these instructions.    

## 2011-10-18 NOTE — Assessment & Plan Note (Signed)
Patient is going to a psychotherapy session next week but she could not give me any details about the name of the place or date of her appointment. She was very insistent on getting just one month Xanax prescription before she has a good follow up with psychotherapy or psychiatry I will give her 1 month Xanax prescription and will ask her to bring documentation or the name of the doctor on a psychologist she would be seeing  She is aware of the community resources and [facility] Followup in one month. FYI added to the chart

## 2011-10-18 NOTE — Progress Notes (Signed)
Anti-Coagulation Progress Note  Emily Cameron is a 51 y.o. female who is currently on an anti-coagulation regimen.    RECENT RESULTS: Recent results are below, the most recent result is correlated with a dose of 42.5 mg. per week: Lab Results  Component Value Date   INR 2.40 10/18/2011   INR 1.10 10/04/2011   INR 2.10 09/06/2011    ANTI-COAG DOSE:   Latest dosing instructions   Total Sun Mon Tue Wed Thu Fri Sat   42.5 5 mg 7.5 mg 5 mg 7.5 mg 5 mg 7.5 mg 5 mg    (5 mg1) (5 mg1.5) (5 mg1) (5 mg1.5) (5 mg1) (5 mg1.5) (5 mg1)         ANTICOAG SUMMARY: Anticoagulation Episode Summary              Current INR goal 2.0-3.0 Next INR check 11/15/2011   INR from last check 2.40 (10/18/2011)     Weekly max dose (mg)  Target end date Indefinite   Indications Pulmonary embolism, Monitoring for long-term anticoagulant use   [facility] check location [facility]    Send INR reminders to    Comments 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             PATIENT INSTRUCTIONS: Patient Instructions  Patient instructed to take medications as defined in the Anti-coagulation Track section of this encounter.  Patient instructed to take today's dose.  Patient verbalized understanding of these instructions.        FOLLOW-UP Return in 4 weeks (on 11/15/2011) for Follow up INR.  Hulen Luster, III Pharm.D., CACP

## 2011-10-18 NOTE — Progress Notes (Signed)
Patient ID: Emily Cameron, female   DOB: 03-30-1961, 51 y.o.   MRN: [id number] 51 year old woman with past medical history listed below comes to the clinic for her test results and medications refilled  Physical exam  General Appearance:     Filed Vitals:   10/18/11 1431  BP: 105/59  Pulse: 75  Temp: 97 F (36.1 C)  TempSrc: Oral  Height: 5\' 6"  (1.676 m)  Weight: 149 lb (67.586 kg)  SpO2: 100%     Alert, cooperative, no distress, appears stated age  Head:    Normocephalic, without obvious abnormality, atraumatic  Eyes:    PERRL, conjunctiva/corneas clear, EOM's intact, fundi    benign, both eyes       Neck:   Supple, symmetrical, trachea midline, no adenopathy;       thyroid:  No enlargement/tenderness/nodules; no carotid   bruit or JVD  Lungs:     Clear to auscultation bilaterally, respirations unlabored  Chest wall:    No tenderness or deformity  Heart:    Regular rate and rhythm, S1 and S2 normal, no murmur, rub   or gallop  Abdomen:     Soft, non-tender, bowel sounds active all four quadrants,    no masses, no organomegaly  Extremities:   Extremities normal, atraumatic, no cyanosis or edema  Pulses:   2+ and symmetric all extremities  Skin:   Skin color, texture, turgor normal, no rashes or lesions  Neurologic:  nonfocal grossly    Review of system  Constitutional: Denies fever, chills, diaphoresis, appetite change and fatigue.  Respiratory: Denies SOB, DOE, cough, chest tightness,  and wheezing.   Cardiovascular: Denies chest pain, palpitations and leg swelling.  Gastrointestinal: Denies nausea, vomiting, abdominal pain, diarrhea, constipation, blood in stool and abdominal distention.  Skin: Denies pallor, rash and wound.  Neurological: Denies dizziness, light-headedness, numbness and headaches.

## 2011-10-25 ENCOUNTER — Other Ambulatory Visit: Payer: Self-pay | Admitting: *Deleted

## 2011-10-25 DIAGNOSIS — F419 Anxiety disorder, unspecified: Secondary | ICD-10-CM

## 2011-10-25 NOTE — Telephone Encounter (Signed)
The following was placed in the Controlled Substances warning:  Patient is getting monthly Xanax prescription for anxiety. She has not been evaluated by psychiatry or psychotherapy. She should get this done before she gets Next prescription. Last prescription given on 10/18/2011.  Review of the last note suggests that she was seeing someone for psychotherapy this week but was not clear as to whom and exactly when.  She also received a 30 day supply on 10/18/2011 so this refill request is 3 weeks too early if it was to be refilled.  This refill request will be rejected at this time for the above reasons.

## 2011-10-27 NOTE — Telephone Encounter (Signed)
i spoke w/ pharmacy xanax last filled 09/14/2011, i tried to call pt to clarify whether she rec'd a hard script at her appt or if [facility] had [facility], i was unable to reach pt, at one # someone identified himself as her son and gave me this # to call [phone] i called no one answered i left a message for a rtc.

## 2011-11-01 ENCOUNTER — Telehealth: Payer: Self-pay | Admitting: *Deleted

## 2011-11-01 NOTE — Telephone Encounter (Signed)
Pt called about Xanax and Ambien.  Stated she took rxs to [facility] but could not fill d/t no DEA # on the rxs and then was told she did not drop off any rxs.  I called the pharmacy; Dr Scot Dock had written the rxs on 10/18/11; DEA # was given; problem resolved. Pt was called back and made awared.

## 2011-11-13 ENCOUNTER — Other Ambulatory Visit: Payer: Self-pay | Admitting: Internal Medicine

## 2011-11-13 DIAGNOSIS — I2699 Other pulmonary embolism without acute cor pulmonale: Secondary | ICD-10-CM

## 2011-11-15 ENCOUNTER — Ambulatory Visit (INDEPENDENT_AMBULATORY_CARE_PROVIDER_SITE_OTHER): Payer: Medicaid Other | Admitting: Pharmacist

## 2011-11-15 DIAGNOSIS — Z7901 Long term (current) use of anticoagulants: Secondary | ICD-10-CM

## 2011-11-15 DIAGNOSIS — I2699 Other pulmonary embolism without acute cor pulmonale: Secondary | ICD-10-CM

## 2011-11-15 MED ORDER — WARFARIN SODIUM 5 MG PO TABS: ORAL_TABLET | ORAL | Status: DC

## 2011-11-15 NOTE — Progress Notes (Signed)
Anti-Coagulation Progress Note  Emily Cameron is a 51 y.o. female who is currently on an anti-coagulation regimen.    RECENT RESULTS: Recent results are below, the most recent result is correlated with a dose of 42.5 mg. per week: Lab Results  Component Value Date   INR 1.50 11/15/2011   INR 2.40 10/18/2011   INR 1.10 10/04/2011    ANTI-COAG DOSE:   Latest dosing instructions   Total Sun Mon Tue Wed Thu Fri Sat   45 5 mg 7.5 mg 7.5 mg 7.5 mg 5 mg 7.5 mg 5 mg    (5 mg1) (5 mg1.5) (5 mg1.5) (5 mg1.5) (5 mg1) (5 mg1.5) (5 mg1)         ANTICOAG SUMMARY: Anticoagulation Episode Summary              Current INR goal 2.0-3.0 Next INR check 12/06/2011   INR from last check 1.50! 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             ANTICOAG TODAY: Anticoagulation Summary as of 11/15/2011              INR goal 2.0-3.0     Selected INR 1.50! 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             PATIENT INSTRUCTIONS: Patient Instructions  Patient instructed to take medications as defined in the Anti-coagulation Track section of this encounter.  Patient instructed to takd today's dose.  Patient verbalized understanding of these instructions.        FOLLOW-UP Return in 3 weeks (on 12/06/2011) for Follow up INR.  Hulen Luster, III Pharm.D., CACP

## 2011-11-15 NOTE — Patient Instructions (Signed)
Patient instructed to take medications as defined in the Anti-coagulation Track section of this encounter.  Patient instructed to takd today's dose.  Patient verbalized understanding of these instructions.     

## 2011-11-16 ENCOUNTER — Other Ambulatory Visit: Payer: Self-pay | Admitting: Internal Medicine

## 2011-11-16 NOTE — Telephone Encounter (Signed)
Patient states that she is running out of her coumadin and is requesting prescription.  In our system ,prescription was already sent and the patient was informed about it. Thanks, [employer]

## 2011-12-06 ENCOUNTER — Ambulatory Visit (INDEPENDENT_AMBULATORY_CARE_PROVIDER_SITE_OTHER): Payer: Medicaid Other | Admitting: Pharmacist

## 2011-12-06 DIAGNOSIS — I2699 Other pulmonary embolism without acute cor pulmonale: Secondary | ICD-10-CM

## 2011-12-06 DIAGNOSIS — Z7901 Long term (current) use of anticoagulants: Secondary | ICD-10-CM

## 2011-12-06 NOTE — Patient Instructions (Signed)
Patient instructed to take medications as defined in the Anti-coagulation Track section of this encounter.  Patient instructed to OMIT today's dose.  Patient verbalized understanding of these instructions.    

## 2011-12-06 NOTE — Progress Notes (Signed)
Anti-Coagulation Progress Note  Emily Cameron is a 51 y.o. female who is currently on an anti-coagulation regimen.    RECENT RESULTS: Recent results are below, the most recent result is correlated with a dose of 45 mg. per week: Lab Results  Component Value Date   INR 5.00 12/06/2011   INR 1.50 11/15/2011   INR 2.40 10/18/2011    ANTI-COAG DOSE:   Latest dosing instructions   Total Sun Mon Tue Wed Thu Fri Sat   40 5 mg 5 mg 7.5 mg 5 mg 7.5 mg 5 mg 5 mg    (5 mg1) (5 mg1) (5 mg1.5) (5 mg1) (5 mg1.5) (5 mg1) (5 mg1)         ANTICOAG SUMMARY: Anticoagulation Episode Summary              Current INR goal 2.0-3.0 Next INR check 12/20/2011   INR from last check 5.00! 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             ANTICOAG TODAY: Anticoagulation Summary as of 12/06/2011              INR goal 2.0-3.0     Selected INR 5.00! 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             PATIENT INSTRUCTIONS: Patient Instructions  Patient instructed to take medications as defined in the Anti-coagulation Track section of this encounter.  Patient instructed to OMIT today's dose.  Patient verbalized understanding of these instructions.        FOLLOW-UP Return in 2 weeks (on 12/20/2011) for Follow up INR.  Hulen Luster, III Pharm.D., CACP

## 2011-12-20 ENCOUNTER — Ambulatory Visit: Payer: Medicaid Other

## 2011-12-27 ENCOUNTER — Ambulatory Visit (INDEPENDENT_AMBULATORY_CARE_PROVIDER_SITE_OTHER): Payer: Medicaid Other | Admitting: Pharmacist

## 2011-12-27 DIAGNOSIS — Z7901 Long term (current) use of anticoagulants: Secondary | ICD-10-CM

## 2011-12-27 DIAGNOSIS — I2699 Other pulmonary embolism without acute cor pulmonale: Secondary | ICD-10-CM

## 2011-12-27 LAB — POCT INR: INR: 2.3

## 2011-12-27 MED ORDER — WARFARIN SODIUM 5 MG PO TABS: ORAL_TABLET | ORAL | Status: DC

## 2011-12-27 NOTE — Progress Notes (Signed)
Anti-Coagulation Progress Note  Emily Cameron is a 51 y.o. female who is currently on an anti-coagulation regimen.    RECENT RESULTS: Recent results are below, the most recent result is correlated with a dose of 40 mg. per week: Lab Results  Component Value Date   INR 2.30 12/27/2011   INR 5.00 12/06/2011   INR 1.50 11/15/2011    ANTI-COAG DOSE:   Latest dosing instructions   Total Sun Mon Tue Wed Thu Fri Sat   40 5 mg 5 mg 7.5 mg 5 mg 7.5 mg 5 mg 5 mg    (5 mg1) (5 mg1) (5 mg1.5) (5 mg1) (5 mg1.5) (5 mg1) (5 mg1)         ANTICOAG SUMMARY: Anticoagulation Episode Summary              Current INR goal 2.0-3.0 Next INR check 01/24/2012   INR from last check 2.30 (12/27/2011)     Weekly max dose (mg)  Target end date Indefinite   Indications Pulmonary embolism, Monitoring for long-term anticoagulant use   [facility] check location [facility]    Send INR reminders to    Comments 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             PATIENT INSTRUCTIONS: Patient Instructions  Patient instructed to take medications as defined in the Anti-coagulation Track section of this encounter.  Patient instructed to take today's dose.  Patient verbalized understanding of these instructions.        FOLLOW-UP Return in 4 weeks (on 01/24/2012) for Follow up INR.  Hulen Luster, III Pharm.D., CACP

## 2011-12-27 NOTE — Patient Instructions (Signed)
Patient instructed to take medications as defined in the Anti-coagulation Track section of this encounter.  Patient instructed to take today's dose.  Patient verbalized understanding of these instructions.    

## 2011-12-28 NOTE — Progress Notes (Signed)
Coumadin rx called to [facility] ([facility] closed) - pt awared.

## 2012-01-24 ENCOUNTER — Ambulatory Visit: Payer: Medicaid Other

## 2012-01-31 ENCOUNTER — Ambulatory Visit (INDEPENDENT_AMBULATORY_CARE_PROVIDER_SITE_OTHER): Payer: Medicaid Other | Admitting: Pharmacist

## 2012-01-31 DIAGNOSIS — I2699 Other pulmonary embolism without acute cor pulmonale: Secondary | ICD-10-CM

## 2012-01-31 DIAGNOSIS — Z7901 Long term (current) use of anticoagulants: Secondary | ICD-10-CM

## 2012-01-31 LAB — POCT INR: INR: 2

## 2012-01-31 NOTE — Progress Notes (Signed)
Anti-Coagulation Progress Note  Emily Cameron is a 51 y.o. female who is currently on an anti-coagulation regimen.    RECENT RESULTS: Recent results are below, the most recent result is correlated with a dose of 40 mg. per week: Lab Results  Component Value Date   INR 2.0 01/31/2012   INR 2.30 12/27/2011   INR 5.00 12/06/2011    ANTI-COAG DOSE:   Latest dosing instructions   Total Sun Mon Tue Wed Thu Fri Sat   42.5 5 mg 7.5 mg 5 mg 7.5 mg 5 mg 7.5 mg 5 mg    (5 mg1) (5 mg1.5) (5 mg1) (5 mg1.5) (5 mg1) (5 mg1.5) (5 mg1)         ANTICOAG SUMMARY: Anticoagulation Episode Summary              Current INR goal 2.0-3.0 Next INR check 02/28/2012   INR from last check 2.0 (01/31/2012)     Weekly max dose (mg)  Target end date Indefinite   Indications Pulmonary embolism, Monitoring for long-term anticoagulant use   INR check location [facility]    Send INR reminders to    Comments 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             PATIENT INSTRUCTIONS: Patient Instructions  Patient instructed to take medications as defined in the Anti-coagulation Track section of this encounter.  Patient instructed to take today's dose.  Patient verbalized understanding of these instructions.        FOLLOW-UP Return in 4 weeks (on 02/28/2012) for Follow up INR at 2:15PM.  Hulen Luster, III Pharm.D., CACP

## 2012-01-31 NOTE — Patient Instructions (Signed)
Patient instructed to take medications as defined in the Anti-coagulation Track section of this encounter.  Patient instructed to take today's dose.  Patient verbalized understanding of these instructions.    

## 2012-02-07 ENCOUNTER — Encounter: Payer: Medicaid Other | Admitting: Internal Medicine

## 2012-02-08 ENCOUNTER — Other Ambulatory Visit: Payer: Self-pay | Admitting: *Deleted

## 2012-02-08 DIAGNOSIS — G4700 Insomnia, unspecified: Secondary | ICD-10-CM

## 2012-02-08 MED ORDER — ZOLPIDEM TARTRATE 5 MG PO TABS: 5.0000 mg | ORAL_TABLET | Freq: Every evening | ORAL | Status: DC | PRN

## 2012-02-09 NOTE — Telephone Encounter (Signed)
Rx called in to pharmacy and pt aware. 

## 2012-02-18 ENCOUNTER — Ambulatory Visit (INDEPENDENT_AMBULATORY_CARE_PROVIDER_SITE_OTHER): Payer: Medicaid Other | Admitting: Internal Medicine

## 2012-02-18 ENCOUNTER — Encounter: Payer: Self-pay | Admitting: Internal Medicine

## 2012-02-18 ENCOUNTER — Telehealth: Payer: Self-pay | Admitting: *Deleted

## 2012-02-18 ENCOUNTER — Other Ambulatory Visit (INDEPENDENT_AMBULATORY_CARE_PROVIDER_SITE_OTHER): Payer: Medicaid Other

## 2012-02-18 VITALS — BP 108/70 | HR 65 | Temp 97.3°F | Wt 149.5 lb

## 2012-02-18 DIAGNOSIS — J209 Acute bronchitis, unspecified: Secondary | ICD-10-CM

## 2012-02-18 DIAGNOSIS — Z7901 Long term (current) use of anticoagulants: Secondary | ICD-10-CM

## 2012-02-18 DIAGNOSIS — R195 Other fecal abnormalities: Secondary | ICD-10-CM

## 2012-02-18 DIAGNOSIS — R05 Cough: Secondary | ICD-10-CM

## 2012-02-18 DIAGNOSIS — Z5181 Encounter for therapeutic drug level monitoring: Secondary | ICD-10-CM

## 2012-02-18 LAB — CBC WITH DIFFERENTIAL/PLATELET
Basophils Absolute: 0 10*3/uL (ref 0.0–0.1)
Basophils Relative: 0 % (ref 0–1)
Eosinophils Absolute: 0.2 10*3/uL (ref 0.0–0.7)
Eosinophils Relative: 3 % (ref 0–5)
HCT: 38.6 % (ref 36.0–46.0)
Lymphocytes Relative: 38 % (ref 12–46)
MCH: 30.7 pg (ref 26.0–34.0)
MCHC: 34.7 g/dL (ref 30.0–36.0)
MCV: 88.3 fL (ref 78.0–100.0)
Monocytes Absolute: 0.5 10*3/uL (ref 0.1–1.0)
Neutro Abs: 3.5 10*3/uL (ref 1.7–7.7)
Neutrophils Relative %: 51 % (ref 43–77)
Platelets: 231 10*3/uL (ref 150–400)
RDW: 14.9 % (ref 11.5–15.5)

## 2012-02-18 LAB — POCT INR: INR: 3.5

## 2012-02-18 MED ORDER — DOXYCYCLINE HYCLATE 100 MG PO TABS: 100.0000 mg | ORAL_TABLET | Freq: Two times a day (BID) | ORAL | Status: AC

## 2012-02-18 MED ORDER — GUAIFENESIN ER 600 MG PO TB12: 600.0000 mg | ORAL_TABLET | Freq: Two times a day (BID) | ORAL | Status: DC

## 2012-02-18 NOTE — Telephone Encounter (Signed)
Pt called with c/o ache in chest for for several days.  She got up this morning and cough and noted blood in sputum. Very small amount about the size of dime, bright red. X 2. Also feels SOB on and off with movement.  States can't cough up what is in her chest.  She does have chest soreness. Also states BM's are cherry brown color, onset today. Very hard to evaluate this conversation. Pt feels this might be another PE. Pt # [id number]  HX: PE  Pt on coumadin. No appointments today.

## 2012-02-18 NOTE — Patient Instructions (Signed)
You were seen for a cough and we think it is bronchitis. We are giving you an antibiotic called doxycycline which you take 1 pill twice a day. We will also give you a medicine to break up the congestion. Continue taking your coumadin as usual. Come back on Tuesday for your appointment. STOP SMOKING!  Acute Bronchitis Bronchitis is when the organs and tissues involved in breathing get puffy (swollen) and can leak fluid. This makes it harder for air to get in and out of the lungs. You may cough a lot and produce thick spit (mucus). Acute means the illness started suddenly. HOME CARE  Rest.   Drink enough fluids to keep the pee (urine) clear or pale yellow.   Medicines may be given that will open up your airways to help you breathe better. Only take medicine as told by your doctor.   Use a cool mist vaporizer. This will help to thin any thick spit.   Do not smoke. Avoid secondhand smoke.  GET HELP RIGHT AWAY IF:   You have a temperature by mouth above 102 F (38.9 C), not controlled by medicine.   You have chills.   You develop severe shortness of breath or chest pain.   You have bloody spit mixed with mucus (sputum).   You throw up (vomit) often.   You lose too much body fluid (dehydrated).   You have a severe headache.   You feel faint.   You do not improve after 1 week of treatment.  MAKE SURE YOU:   Understand these instructions.   Will watch your condition.   Will get help right away if you are not doing well or get worse.  Document Released: 01/26/2008 Document Revised: 07/29/2011 Document Reviewed: 08/27/2009 [facility] Patient Information 2012 [place], [place].

## 2012-02-18 NOTE — Telephone Encounter (Signed)
Has these URI / allergy episodes off and on over several yrs. Has been told she has allergies. Feesl terrible for two days. Sinus congestion - took allergy OTC last PM and moved sinus from head to nose / chest so head feels better but nose and chest worse. Has to clear throat but can't get anything up. Tinged with blood once only. + dyspnea. No temp.   This is likely viral URI and / or allergic sxs. Symptomatic tx with tylenol, rest, fluids. To ER / UCC if no worse.  Also with dark stools. Feels dizzy and lightheaded. Last INR earlier this month OK. No CBC > one year. No colon bc only 48.   To come for lab visit - stat CBC and INR. TO take stool cards home. To ER / UCC if worse.

## 2012-02-21 ENCOUNTER — Encounter: Payer: Self-pay | Admitting: Internal Medicine

## 2012-02-21 ENCOUNTER — Ambulatory Visit (HOSPITAL_COMMUNITY)
Admission: RE | Admit: 2012-02-21 | Discharge: 2012-02-21 | Disposition: A | Discharge status: Home / Self Care | Payer: Medicaid Other | Source: Ambulatory Visit | Attending: Internal Medicine | Admitting: Internal Medicine

## 2012-02-21 ENCOUNTER — Ambulatory Visit (INDEPENDENT_AMBULATORY_CARE_PROVIDER_SITE_OTHER): Payer: Medicaid Other | Admitting: Internal Medicine

## 2012-02-21 VITALS — BP 104/68 | HR 62 | Temp 97.2°F | Ht 66.0 in | Wt 147.0 lb

## 2012-02-21 DIAGNOSIS — R0602 Shortness of breath: Secondary | ICD-10-CM | POA: Insufficient documentation

## 2012-02-21 DIAGNOSIS — Z86711 Personal history of pulmonary embolism: Secondary | ICD-10-CM | POA: Insufficient documentation

## 2012-02-21 DIAGNOSIS — Z7901 Long term (current) use of anticoagulants: Secondary | ICD-10-CM | POA: Insufficient documentation

## 2012-02-21 DIAGNOSIS — I2699 Other pulmonary embolism without acute cor pulmonale: Secondary | ICD-10-CM

## 2012-02-21 DIAGNOSIS — Z720 Tobacco use: Secondary | ICD-10-CM

## 2012-02-21 DIAGNOSIS — F329 Major depressive disorder, single episode, unspecified: Secondary | ICD-10-CM

## 2012-02-21 DIAGNOSIS — F17200 Nicotine dependence, unspecified, uncomplicated: Secondary | ICD-10-CM

## 2012-02-21 DIAGNOSIS — G4700 Insomnia, unspecified: Secondary | ICD-10-CM

## 2012-02-21 DIAGNOSIS — E559 Vitamin D deficiency, unspecified: Secondary | ICD-10-CM

## 2012-02-21 IMAGING — CR DG CHEST 2V
2 series · 2 of 2 positions shown · non-contrast
Comparison: CT thorax 07/22/2011

CLINICAL DATA: Short of breath

CHEST - 2 VIEW

[w chest pa]
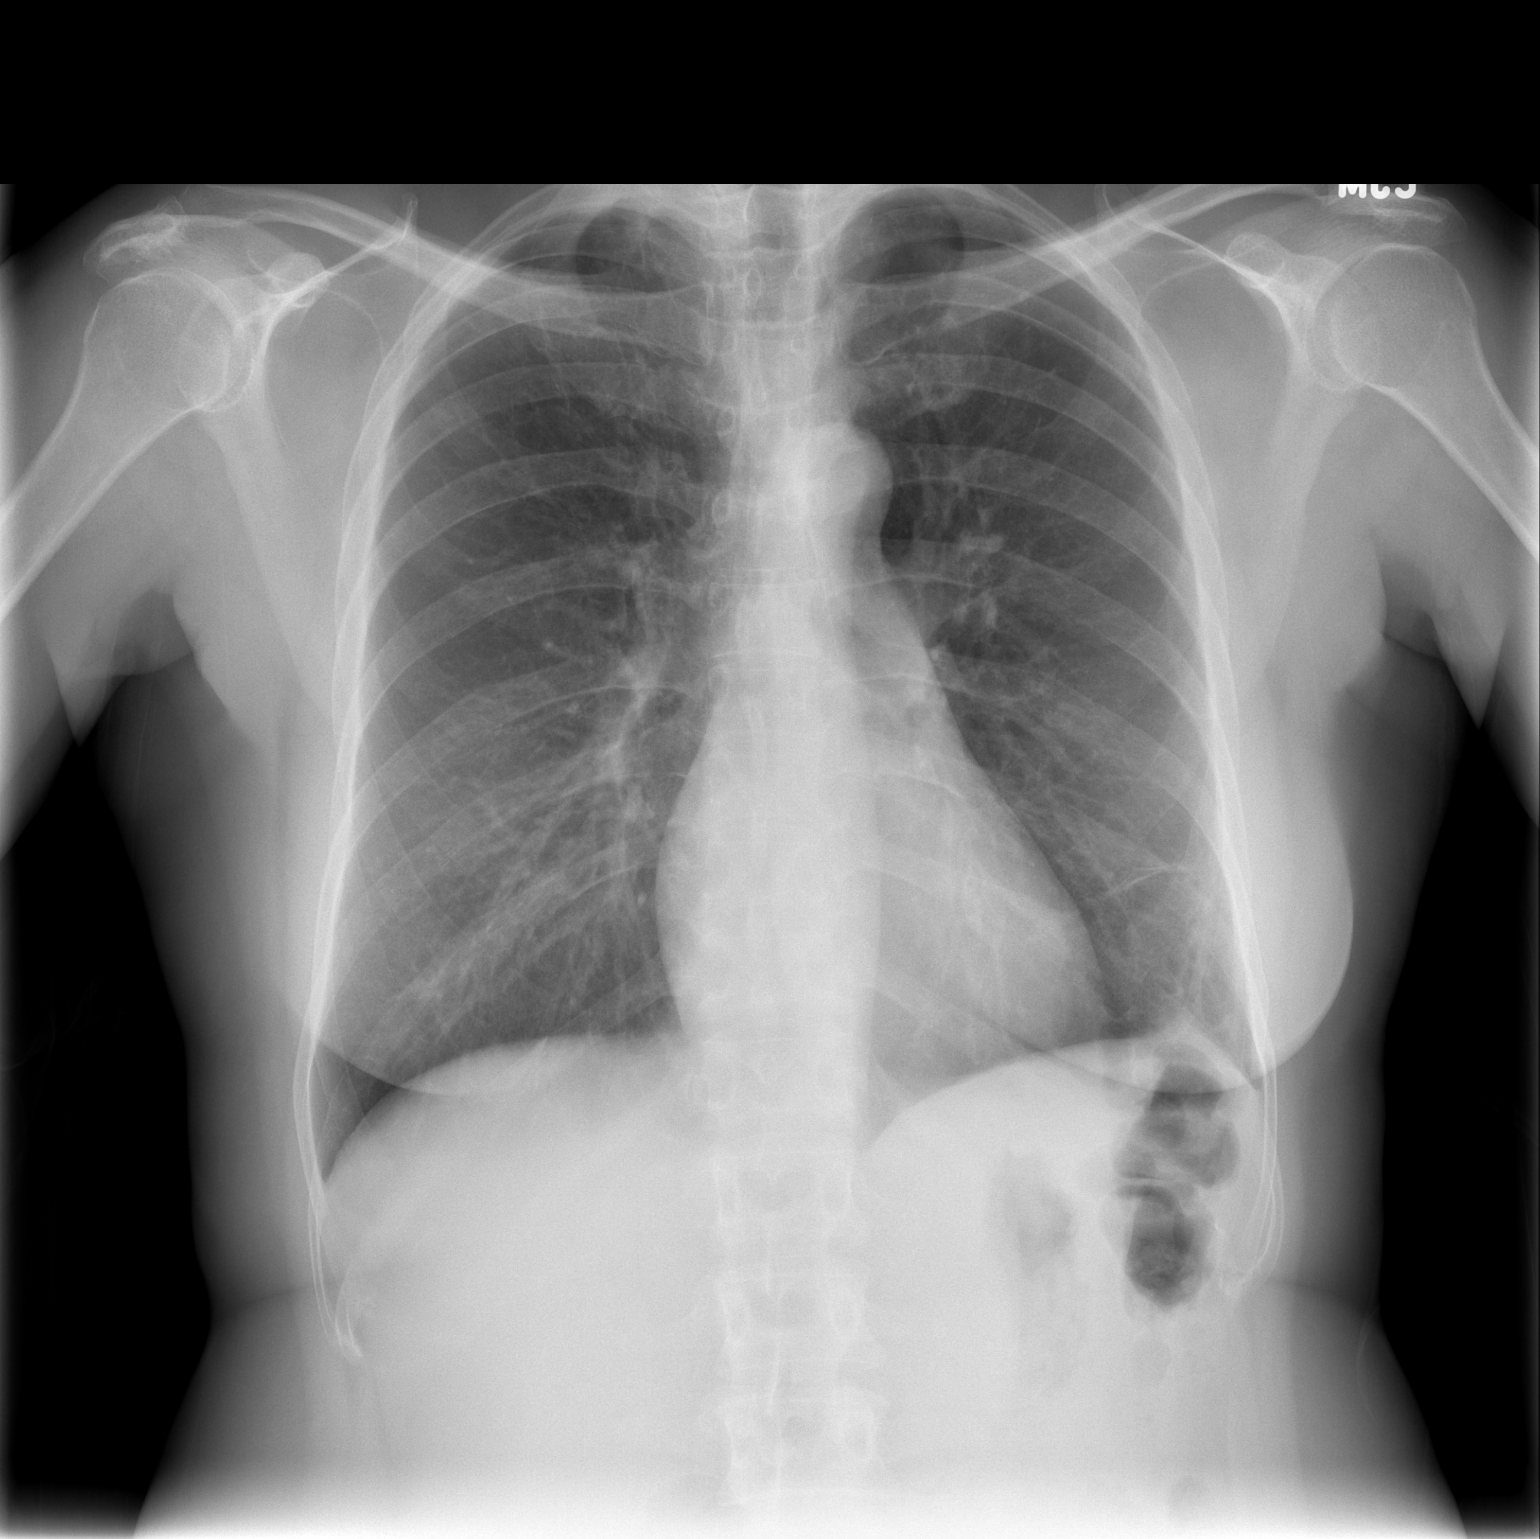

[w chest lat]
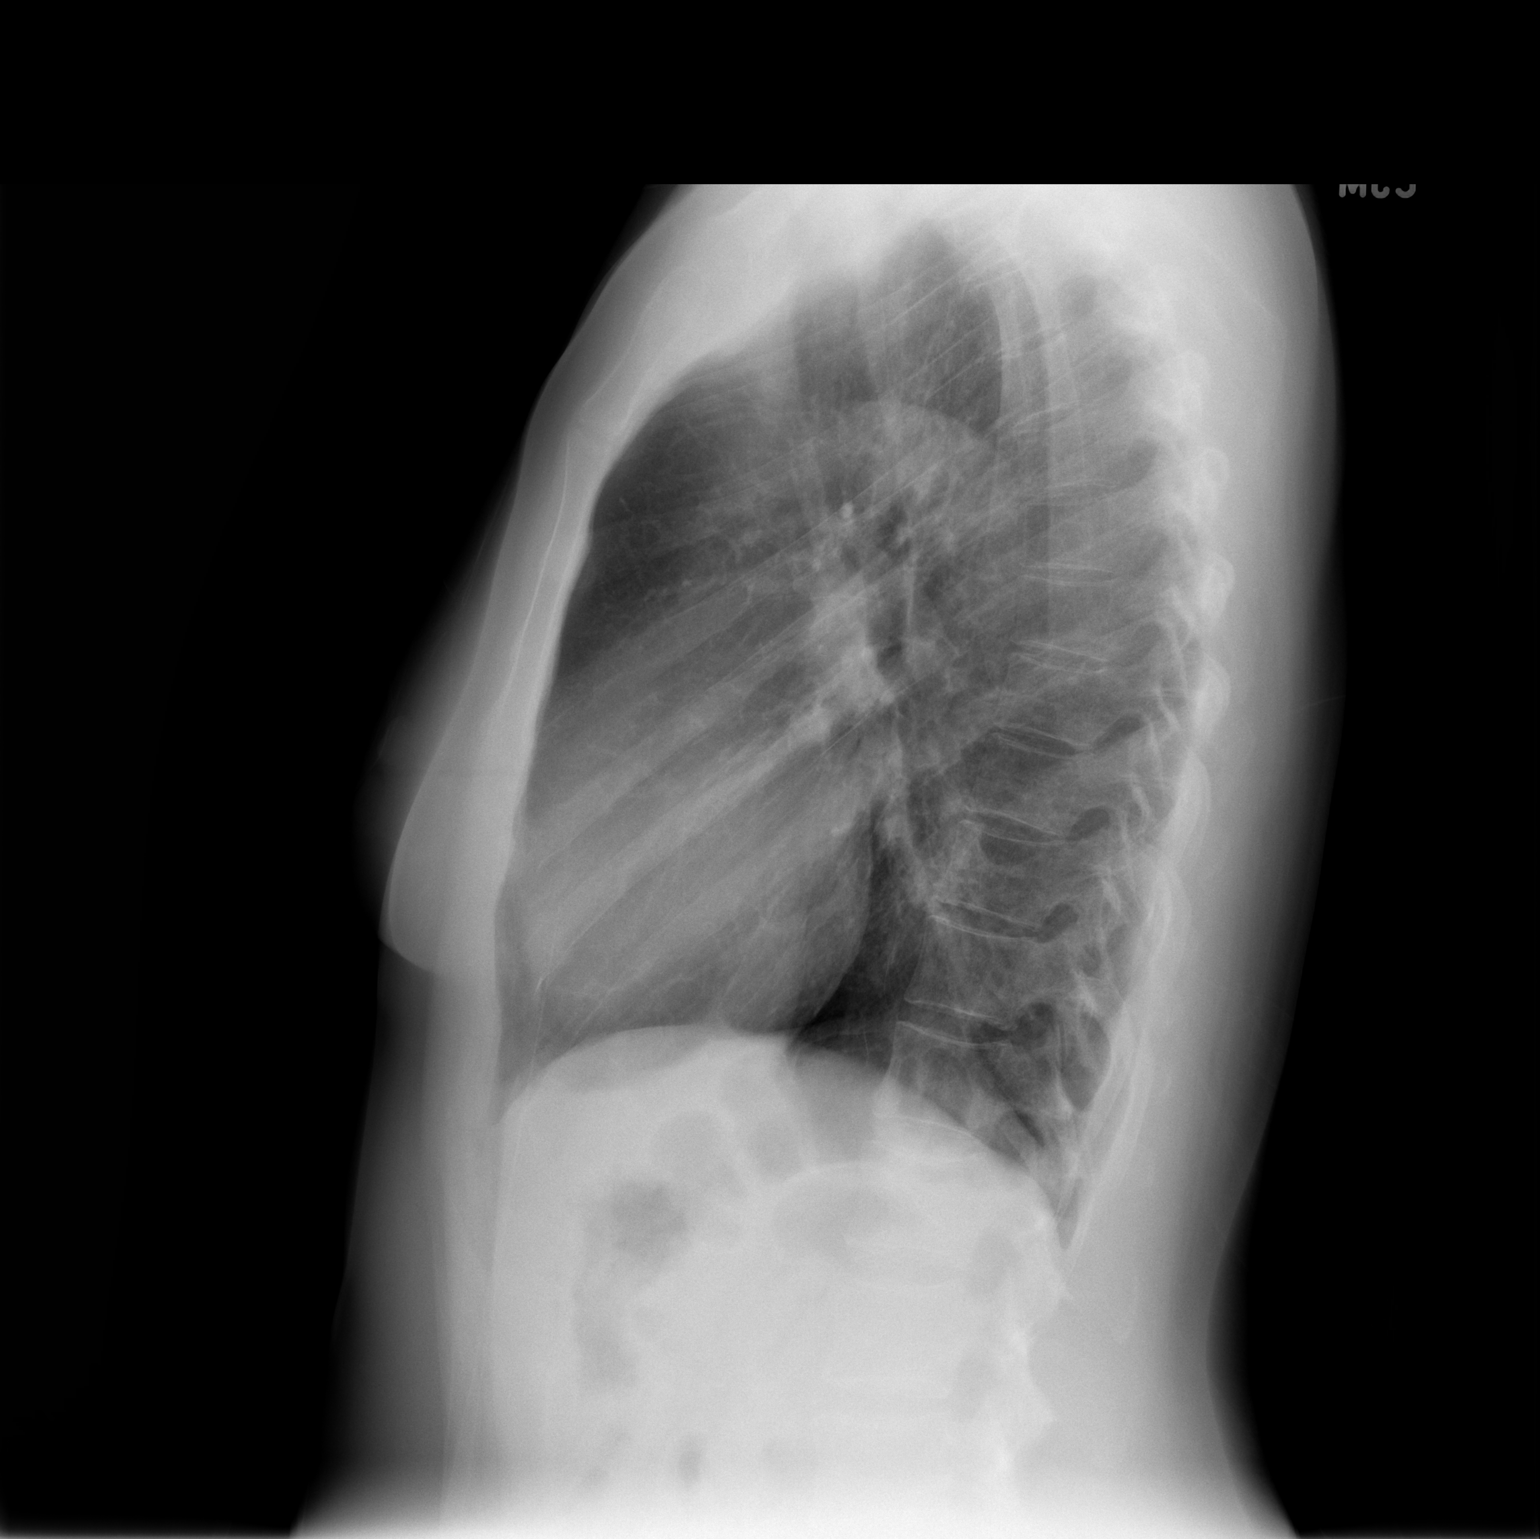

[2 of 2 positions shown; findings below may reference images not displayed]

FINDINGS: Normal cardiac silhouette.  There is mild scarring at the
left lung base.  No effusion, infiltrate, pneumothorax. No acute
osseous abnormality.
IMPRESSION: 1..  Left basilar scarring.
2.  No acute cardiopulmonary findings.

## 2012-02-21 MED ORDER — ALBUTEROL SULFATE HFA 108 (90 BASE) MCG/ACT IN AERS: 2.0000 | INHALATION_SPRAY | Freq: Four times a day (QID) | RESPIRATORY_TRACT | Status: DC | PRN

## 2012-02-21 MED ORDER — ZOLPIDEM TARTRATE 10 MG PO TABS: 10.0000 mg | ORAL_TABLET | Freq: Every evening | ORAL | Status: DC | PRN

## 2012-02-21 NOTE — Progress Notes (Signed)
Subjective:   Patient ID: Emily Cameron female   DOB: 12/02/1960 51 y.o.   MRN: [id number]  HPI: [name] is a 51 y.o. female with PMH significatn as outined below who  Presented to the clinic for a follow up. Patient was evaluated on 6/26 for cough and was prescribed Doxycyline. Patient reports that the cough has somewhat improved after starting antibiotic therapy. She noted she occasionally experience a productive cough with whitish phlegm. Noted SOB with occasionally wheezing. Patient continue to smoke but has cut down significantly. She denies any chest pain, fevers or chills. No recent sick contact, recent travel or surgeries.   Insomnia: Patient noted that she continues to have trouble staying asleep. Has changes somewhat he sleep hygiene is now able to fall asleep better.   Patient  Is planning to attend [employer] to complete GED starting next week.     Past Medical History  Diagnosis Date  . Anxiety   . Depression   . Insomnia   . Weight loss     due to anxiety  . PE (pulmonary embolism) 2006  . Alcohol abuse   . Polysubstance abuse     Current smoking and alcohol. 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 Marijuna not taking since 7 month.    Current Outpatient Prescriptions  Medication Sig Dispense Refill  . ALPRAZolam (XANAX) 0.25 MG tablet Take 1 tablet (0.25 mg total) by mouth daily as needed for anxiety.  30 tablet  0  . Cholecalciferol 50000 UNITS TABS Take 1 tablet by mouth once a week.  8 tablet  0  . cyclobenzaprine (FLEXERIL) 5 MG tablet Take 1 tablet (5 mg total) by mouth every 8 (eight) hours as needed for muscle spasms.  30 tablet  0  . doxycycline (VIBRA-TABS) 100 MG tablet Take 1 tablet (100 mg total) by mouth 2 (two) times daily.  14 tablet  0  . guaiFENesin (MUCINEX) 600 MG 12 hr tablet Take 1 tablet (600 mg total) by mouth 2 (two) times daily.  14 tablet  0  . mirtazapine (REMERON) 15 MG tablet Take 1 tablet (15 mg total) by mouth at bedtime.   30 tablet  3  . Omeprazole 20 MG TBEC Take 1 tablet (20 mg total) by mouth daily.  30 each  3  . polyethylene glycol (MIRALAX / GLYCOLAX) packet Take 17 g by mouth daily.  14 each  2  . pravastatin (PRAVACHOL) 20 MG tablet Take 1 tablet (20 mg total) by mouth daily. To decrease cholesterol  30 tablet  2  . warfarin (COUMADIN) 5 MG tablet Take as directed by anticoagulation clinic provider.  50 tablet  2  . zolpidem (AMBIEN) 5 MG tablet Take 1 tablet (5 mg total) by mouth at bedtime as needed for sleep.  30 tablet  0   Family History  Problem Relation Age of Onset  . Coronary artery disease Sister   . Heart disease Mother   . Hyperlipidemia Mother   . Hypertension Mother   . Aneurysm Brother 43  . Aneurysm Paternal Aunt 75    Died   . Heart disease Maternal Grandmother   . Pulmonary embolism Brother    History   Social History  . Marital Status: Single    Spouse Name: N/A    Number of Children: N/A  . Years of Education: N/A   Occupational History  . Psychiatric nurse   . housekeeping    Social History Main Topics  .  Smoking status: Current Everyday Smoker -- 0.5 packs/day for 31 years    Types: Cigarettes  . Smokeless tobacco: None  . Alcohol Use: Yes     2 beers a week  . Drug Use: No  . Sexually Active: Yes   Other Topics Concern  . None   Social History Narrative   Single, does not exercise regularly   Review of Systems: Constitutional: Denies fever, chills, diaphoresis, appetite change and fatigue.  HEENT: Denies congestion, sore throat, rhinorrhea, trouble swallowing, neck pain, neck stiffness and tinnitus.   Respiratory: Noted SOB, DOE, cough, chest tightness,  and wheezing.   Cardiovascular: Denies chest pain, palpitations and leg swelling.  Gastrointestinal: Denies nausea, vomiting, abdominal pain, diarrhea, constipation, blood in stool and abdominal distention.  Skin: Denies pallor, rash and wound.  Neurological: Denies dizziness,  Psychiatric/Behavioral:  Denies suicidal ideation.   Objective:  Physical Exam: Filed Vitals:   02/21/12 1535 02/21/12 1539  BP: 94/60 89/60  Pulse: 69 62  Temp: 97.2 F (36.2 C)   TempSrc: Oral   Height: 5\' 6"  (1.676 m)   Weight: 147 lb (66.679 kg)    Constitutional: Vital signs reviewed.  Patient is a well-developed and well-nourished woman in no acute distress and cooperative with exam. Alert and oriented x3.  Mouth: no erythema or exudates, MMM Neck: Supple,  Cardiovascular: RRR, S1 normal, S2 normal, no MRG, pulses symmetric and intact bilaterally Pulmonary/Chest: CTAB, no wheezes, rales, or rhonchi Abdominal: Soft. Non-tender, non-distended, bowel sounds are normal, no masses, organomegaly, or guarding present.  Hematology: no cervicaladenopathy.  Neurological: A&O x3, no focal motor deficit Skin: Warm, dry and intact. No rash, cyanosis, or clubbing.  Psychiatric: Normal mood and affect. speech and behavior is normal.

## 2012-02-22 NOTE — Progress Notes (Signed)
Subjective:     Patient ID: Emily Cameron, female   DOB: 10-10-1960, 51 y.o.   MRN: [id number]  HPI The patient is a 51 year old female comes in for a history of PE on chronic Coumadin. She is status post 2 VT events and does warrant lifelong Coumadin. She comes in today for cough and this has been going on she states for 5 years off and on. It does have periods but it's much better and. Number is much worse. She is coming in today with cough that does have a fleck of blood in it this morning. Since she is on Coumadin she was always told to take this very seriously and does present. She does not have any other complaints. She is not really coughing up anything except slight mucousy at this fleck of blood. The blood she does have with her and is about the size of the 2 mm circle. She is not having any problems breathing. She does feel like she has some sinus congestion. She's not having fevers or chills. She does continue to take her Coumadin as scheduled. She has not had change in her Coumadin dosing recently.  Review of Systems  Constitutional: Negative for fever, chills, activity change, appetite change, fatigue and unexpected weight change.  Respiratory: Negative for cough, chest tightness, shortness of breath and wheezing.   Cardiovascular: Negative for chest pain, palpitations and leg swelling.  Gastrointestinal: Negative for vomiting, abdominal pain and constipation.  Neurological: Negative for dizziness, syncope, weakness, numbness and headaches.    Vitals: BP 100/60    Objective:   Physical Exam  Constitutional: She is oriented to person, place, and time. She appears well-developed and well-nourished.  HENT:  Head: Normocephalic and atraumatic.  Eyes: EOM are normal. Pupils are equal, round, and reactive to light.  Neck: Normal range of motion. Neck supple.  Cardiovascular: Normal rate and regular rhythm.   Pulmonary/Chest: Effort normal and breath sounds normal. No respiratory  distress. She has no wheezes. She exhibits no tenderness.  Abdominal: Soft. Bowel sounds are normal.  Musculoskeletal: Normal range of motion.  Neurological: She is alert and oriented to person, place, and time.  Skin: Skin is warm and dry.       Assessment/Plan:   1. Cough-patient's cough is likely bronchitis and we'll give her a seven-day course of doxycycline. She does have financial difficulties and therefore a 2 day supply was given to her in the clinic. Did advise her to continue using her albuterol as needed for her breathing. I did recommend that she stop smoking. Sounds like she does have chronic cough and this is a flare. Does not sound like this blood in the cough was significant event. Has not continued.  2. History of PE on Coumadin-patient is on Coumadin and has been on for some time. A recent INR at today's clinic check was 3.5. It would not recommend skipping dose at this time. Would recommend that she does come to see Chancy Milroy within the next week or 2 for repeat monitoring of her INR. CBC was drawn at this visit over concern about hemoglobin levels and they were not low. Her hemoglobin does appear to be at its baseline.  3. Disposition-patient will be seen back for regularly scheduled visit early next week. Have advised her that this cough does continue or not alleviate within the next week or 2 she should please call our clinic back to be seen again. Prescription for doxycycline given. Advised that she can take Mucinex  to help break up the congestion in her chest.

## 2012-02-27 ENCOUNTER — Other Ambulatory Visit: Payer: Self-pay | Admitting: Internal Medicine

## 2012-02-27 NOTE — Assessment & Plan Note (Signed)
Patient was referred to psychiatrist but has not followed up yet. I will not refill patient Xanax if needed.

## 2012-02-27 NOTE — Assessment & Plan Note (Addendum)
Councel for smoking cessation.I offered help per 1-800-quit now hotline and SW but patient would like to cut down without any help.

## 2012-02-27 NOTE — Assessment & Plan Note (Signed)
Patient noted that she has not been taking any medication due to financial restrains.

## 2012-02-27 NOTE — Assessment & Plan Note (Signed)
I concerned patient has underlying COPD with long history of smoking and emphysematous changes on CT chest. Will refer patient for PFT for possible changes in management.

## 2012-02-27 NOTE — Assessment & Plan Note (Signed)
Patient has trouble staying a sleep. Sleep study was performed but was within normal limits. I have discussed in the past and now about sleep hygiene. I will prescribe Ambien prn.

## 2012-02-28 ENCOUNTER — Ambulatory Visit (INDEPENDENT_AMBULATORY_CARE_PROVIDER_SITE_OTHER): Payer: Medicaid Other | Admitting: Pharmacist

## 2012-02-28 DIAGNOSIS — I2699 Other pulmonary embolism without acute cor pulmonale: Secondary | ICD-10-CM

## 2012-02-28 DIAGNOSIS — Z5181 Encounter for therapeutic drug level monitoring: Secondary | ICD-10-CM

## 2012-02-28 DIAGNOSIS — Z7901 Long term (current) use of anticoagulants: Secondary | ICD-10-CM

## 2012-02-28 MED ORDER — WARFARIN SODIUM 5 MG PO TABS: ORAL_TABLET | ORAL | Status: DC

## 2012-02-28 NOTE — Patient Instructions (Signed)
Patient instructed to take medications as defined in the Anti-coagulation Track section of this encounter.  Patient instructed to take today's dose.  Patient verbalized understanding of these instructions.    

## 2012-02-28 NOTE — Progress Notes (Signed)
Anti-Coagulation Progress Note  Emily Cameron is a 51 y.o. female who is currently on an anti-coagulation regimen.    RECENT RESULTS: Recent results are below, the most recent result is correlated with a dose of 42.5 mg. per week: Lab Results  Component Value Date   INR 2.00 02/28/2012   INR 3.5 02/18/2012   INR 2.0 01/31/2012    ANTI-COAG DOSE:   Latest dosing instructions   Total Sun Mon Tue Wed Thu Fri Sat   45 5 mg 7.5 mg 7.5 mg 7.5 mg 5 mg 7.5 mg 5 mg    (5 mg1) (5 mg1.5) (5 mg1.5) (5 mg1.5) (5 mg1) (5 mg1.5) (5 mg1)         ANTICOAG SUMMARY: Anticoagulation Episode Summary              Current INR goal 2.0-3.0 Next INR check 03/27/2012   INR from last check 2.00 (02/28/2012)     Weekly max dose (mg)  Target end date Indefinite   Indications Pulmonary embolism, Monitoring for long-term anticoagulant use   INR check location [facility]erred lab    Send INR reminders to    Comments 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             ANTICOAG TODAY: Anticoagulation Summary as of 02/28/2012              INR goal 2.0-3.0     Selected INR 2.00 (02/28/2012) Next INR check 03/27/2012   Weekly max dose (mg)  Target end date Indefinite   Indications Pulmonary embolism, Monitoring for long-term anticoagulant use    Anticoagulation Episode Summary              INR check location [facility] Preferred lab    Send INR reminders to    Comments 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             PATIENT INSTRUCTIONS: Patient Instructions  Patient instructed to take medications as defined in the Anti-coagulation Track section of this encounter.  Patient instructed to take today's dose.  Patient verbalized understanding of these instructions.        FOLLOW-UP Return in 4 weeks (on 03/27/2012) for Follow up INR at 1415h.  Hulen Luster, III Pharm.D., CACP

## 2012-03-06 ENCOUNTER — Encounter: Payer: Medicaid Other | Admitting: Internal Medicine

## 2012-03-08 ENCOUNTER — Institutional Professional Consult (permissible substitution): Payer: Medicaid Other | Admitting: Internal Medicine

## 2012-03-27 ENCOUNTER — Ambulatory Visit (INDEPENDENT_AMBULATORY_CARE_PROVIDER_SITE_OTHER): Payer: Medicaid Other | Admitting: Pharmacist

## 2012-03-27 DIAGNOSIS — Z7901 Long term (current) use of anticoagulants: Secondary | ICD-10-CM

## 2012-03-27 DIAGNOSIS — I2699 Other pulmonary embolism without acute cor pulmonale: Secondary | ICD-10-CM

## 2012-03-27 LAB — POCT INR: INR: 3.2

## 2012-03-27 NOTE — Progress Notes (Signed)
Anti-Coagulation Progress Note  Emily Cameron is a 51 y.o. female who is currently on an anti-coagulation regimen.    RECENT RESULTS: Recent results are below, the most recent result is correlated with a dose of 45 mg. per week: Lab Results  Component Value Date   INR 3.20 03/27/2012   INR 2.00 02/28/2012   INR 3.5 02/18/2012    ANTI-COAG DOSE:   Latest dosing instructions   Total Sun Mon Tue Wed Thu Fri Sat   42.5 5 mg 7.5 mg 5 mg 7.5 mg 5 mg 7.5 mg 5 mg    (5 mg1) (5 mg1.5) (5 mg1) (5 mg1.5) (5 mg1) (5 mg1.5) (5 mg1)         ANTICOAG SUMMARY: Anticoagulation Episode Summary              Current INR goal 2.0-3.0 Next INR check 05/01/2012   INR from last check 3.20! (03/27/2012)     Weekly max dose (mg)  Target end date Indefinite   Indications Pulmonary embolism, Monitoring for long-term anticoagulant use   INR check location [facility]erred lab    Send INR reminders to    Comments 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             ANTICOAG TODAY: Anticoagulation Summary as of 03/27/2012              INR goal 2.0-3.0     Selected INR 3.20! (03/27/2012) Next INR check 05/01/2012   Weekly max dose (mg)  Target end date Indefinite   Indications Pulmonary embolism, Monitoring for long-term anticoagulant use    Anticoagulation Episode Summary              INR check location [facility]erred lab    Send INR reminders to    Comments 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             PATIENT INSTRUCTIONS: Patient Instructions  Patient instructed to take medications as defined in the Anti-coagulation Track section of this encounter.  Patient instructed to take today's dose.  Patient verbalized understanding of these instructions.        FOLLOW-UP Return in 5 weeks (on 05/01/2012) for Follow up INR at 2:15PM.  Hulen Luster, III Pharm.D., CACP

## 2012-03-27 NOTE — Patient Instructions (Signed)
Patient instructed to take medications as defined in the Anti-coagulation Track section of this encounter.  Patient instructed to take today's dose.  Patient verbalized understanding of these instructions.    

## 2012-03-31 ENCOUNTER — Institutional Professional Consult (permissible substitution): Payer: Medicaid Other | Admitting: Internal Medicine

## 2012-03-31 ENCOUNTER — Ambulatory Visit (INDEPENDENT_AMBULATORY_CARE_PROVIDER_SITE_OTHER): Payer: Medicaid Other | Admitting: Internal Medicine

## 2012-03-31 ENCOUNTER — Encounter: Payer: Self-pay | Admitting: Internal Medicine

## 2012-03-31 VITALS — BP 90/60 | HR 64 | Temp 98.4°F | Ht 64.0 in | Wt 144.4 lb

## 2012-03-31 DIAGNOSIS — Z720 Tobacco use: Secondary | ICD-10-CM

## 2012-03-31 DIAGNOSIS — J4489 Other specified chronic obstructive pulmonary disease: Secondary | ICD-10-CM

## 2012-03-31 DIAGNOSIS — R0600 Dyspnea, unspecified: Secondary | ICD-10-CM

## 2012-03-31 DIAGNOSIS — R0989 Other specified symptoms and signs involving the circulatory and respiratory systems: Secondary | ICD-10-CM

## 2012-03-31 DIAGNOSIS — J449 Chronic obstructive pulmonary disease, unspecified: Secondary | ICD-10-CM

## 2012-03-31 DIAGNOSIS — F17200 Nicotine dependence, unspecified, uncomplicated: Secondary | ICD-10-CM

## 2012-03-31 MED ORDER — PANTOPRAZOLE SODIUM 40 MG PO TBEC: 40.0000 mg | DELAYED_RELEASE_TABLET | Freq: Every day | ORAL | Status: DC

## 2012-03-31 MED ORDER — PREDNISONE (PAK) 10 MG PO TABS: ORAL_TABLET | ORAL | Status: AC

## 2012-03-31 MED ORDER — TRAMADOL HCL 50 MG PO TABS: ORAL_TABLET | ORAL | Status: DC

## 2012-03-31 MED ORDER — FAMOTIDINE 20 MG PO TABS: ORAL_TABLET | ORAL | Status: DC

## 2012-03-31 NOTE — Progress Notes (Signed)
  Subjective:    Patient ID: Emily Cameron, female    DOB: [birth year]   MRN: [id number]  HPI  81 yobf with asthma as child and h/o Recurrent Pulmonary emboli (2006 and 2012) referred 03/31/2012 to pulmonary clinic for eval of sob.   03/31/2012 1st pulmonary eval cc sob esp after lying down for a few hours x one year only a little better with inhaler assoc with dysphagia and feeling like something stuck in throat and hoarsness, better with h1 rx , with BaSwallow 05/2011 =Unremarkable barium swallow.  No variability or pattern she's aware of. No excess or purulent mucus or overt HB or sinus complaints, no noct coughing.   Doe x 2 blocks no variability   No need for noct saba. Also denies any obvious fluctuation of symptoms with weather or environmental changes or other aggravating or alleviating factors except as outlined above       Review of Systems  Constitutional: Positive for unexpected weight change. Negative for fever and chills.  HENT: Positive for trouble swallowing. Negative for ear pain, nosebleeds, congestion, sore throat, rhinorrhea, sneezing, dental problem, voice change, postnasal drip and sinus pressure.   Eyes: Negative for visual disturbance.  Respiratory: Positive for shortness of breath. Negative for cough and choking.   Cardiovascular: Positive for chest pain and leg swelling.  Gastrointestinal: Negative for vomiting, abdominal pain and diarrhea.  Genitourinary: Negative for difficulty urinating.  Musculoskeletal: Positive for arthralgias.  Skin: Negative for rash.  Neurological: Negative for tremors, syncope and headaches.  Hematological: Does not bruise/bleed easily.       Objective:   Physical Exam amb extremely hoarse bf nad constantly clearing her throat Wt Readings from Last 3 Encounters:  03/31/12 144 lb 6.4 oz (65.499 kg)  02/21/12 147 lb (66.679 kg)  02/18/12 149 lb 8 oz (67.813 kg)    HEENT mild turbinate edema.  Oropharynx no thrush or excess pnd or  cobblestoning.  No JVD or cervical adenopathy. Min accessory muscle hypertrophy. Trachea midline, nl thryroid. Chest was min hyperinflated by percussion with diminished breath sounds and min increased exp time without wheeze. Hoover sign positive at end inspiration. Regular rate and rhythm without murmur gallop or rub or increase P2 or edema.  Abd: no hsm, nl excursion. Ext warm without cyanosis or clubbing.    cxr 02/21/11 1. Left basilar scarring.  2. No acute cardiopulmonary findings.     Assessment & Plan:

## 2012-03-31 NOTE — Patient Instructions (Addendum)
You don't have a significant lung airway problem at this point and if you stop smoking now you never   Your problem is your throat and may be very serious  Take delsym two tsp every 12 hours and supplement if needed with  tramadol 50 mg up to 2 every 4 hours to suppress the urge to cough. Swallowing water or using ice chips/non mint and menthol containing candies (such as lifesavers or sugarless jolly ranchers) are also effective.  You should rest your voice and avoid activities that you know make you cough.  Once you have eliminated the cough for 3 straight days try reducing the tramadol first,  then the delsym as tolerated.    Prednisone 10 mg take  4 each am x 2 days,   2 each am x 2 days,  1 each am x2days and stop   Try pantoprazole 40 mg  Take 30-60 min before first meal of the day and Pepcid 20 mg one bedtime until you see Dr Loistine Chance in one month, if not better you'll need to see a throat specialist  GERD (REFLUX)  is an extremely common cause of respiratory symptoms, many times with no significant heartburn at all.    It can be treated with medication, but also with lifestyle changes including avoidance of late meals, excessive alcohol, smoking cessation, and avoid fatty foods, chocolate, peppermint, colas, red wine, and acidic juices such as orange juice.  NO MINT OR MENTHOL PRODUCTS SO NO COUGH DROPS  USE SUGARLESS CANDY INSTEAD (jolley ranchers or Stover's)  NO OIL BASED VITAMINS - use powdered substitutes.

## 2012-04-01 NOTE — Assessment & Plan Note (Signed)
>   3 min discussion  I reviewed the Flethcher curve with patient that basically indicates  if you quit smoking when your best day FEV1 is still well preserved (as hers is at this point) it is highly unlikely you will progress to severe disease and informed the patient there was no medication on the market that has proven to change the curve or the likelihood of progression.  Therefore stopping smoking and maintaining abstinence is the most important aspect of care, not choice of inhalers or for that matter, doctors.

## 2012-04-01 NOTE — Assessment & Plan Note (Signed)
Spirometry 03/31/12  FEV1  1.90 (81%) ratio 70   Symptoms are markedly disproportionate to objective findings and not clear this is a lung problem but pt does appear to have difficult airway management issues. 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   These are the same pts (now being labeled as having "irritable larynx syndrome" by some cough centers) who not infrequently have a history of having failed to tolerate ace inhibitors,  dry powder inhalers or biphosphonates or report having atypical reflux symptoms that don't respond to standard doses of PPI , and are easily confused as having aecopd or asthma flares by even experienced allergists/ pulmonologists.   Strongly rec rechallenge with max gerd rx then if still clearing throat and hoarse and convinced she has something stuck in her throat > ENT eval next step as she is at risk of throat cancer

## 2012-04-04 ENCOUNTER — Telehealth: Payer: Self-pay | Admitting: *Deleted

## 2012-04-04 NOTE — Telephone Encounter (Signed)
Patient called earlier to discuss about office visit with Dr Sherene Sires (Pulomology). I informed her that I agree with Dr Sherene Sires to give a trial with PPI ( including protonix and pepcid).  I would like to give trial for 1- 2 month and see how she is doing. If no improvement at all as Dr Sherene Sires noted we will refer patient to ENT for further evaluation and management.

## 2012-04-04 NOTE — Telephone Encounter (Signed)
Returned pt's call. Stated she saw the lung doctor (Dr Sherene Sires) and he told her there was nothing wrong with her lungs but it's her throat. And if something isn't done about this; they will have to "put something in my throat". She wants to make an appt w/Dr Loistine Chance.  Dr Loistine Chance will not have an appt until Oct.  Pt already has an appt w/Dr Anselm Jungling in Sept. She told the front office she wanted to talk to Dr Dayton Martes does not want to get her lawyer involve;she has been coming her about her throat for 7 yrs. I will send this message to Dr Loistine Chance. Pt's cell# [phone].

## 2012-05-01 ENCOUNTER — Ambulatory Visit: Payer: Medicaid Other | Admitting: Internal Medicine

## 2012-05-03 ENCOUNTER — Ambulatory Visit (INDEPENDENT_AMBULATORY_CARE_PROVIDER_SITE_OTHER): Payer: Medicaid Other | Admitting: Internal Medicine

## 2012-05-03 ENCOUNTER — Encounter: Payer: Self-pay | Admitting: Internal Medicine

## 2012-05-03 VITALS — BP 89/60 | HR 82 | Temp 97.5°F | Ht 63.0 in | Wt 148.1 lb

## 2012-05-03 DIAGNOSIS — F3289 Other specified depressive episodes: Secondary | ICD-10-CM

## 2012-05-03 DIAGNOSIS — G4700 Insomnia, unspecified: Secondary | ICD-10-CM

## 2012-05-03 DIAGNOSIS — R6889 Other general symptoms and signs: Secondary | ICD-10-CM

## 2012-05-03 DIAGNOSIS — F329 Major depressive disorder, single episode, unspecified: Secondary | ICD-10-CM

## 2012-05-03 DIAGNOSIS — Z5181 Encounter for therapeutic drug level monitoring: Secondary | ICD-10-CM

## 2012-05-03 DIAGNOSIS — I959 Hypotension, unspecified: Secondary | ICD-10-CM

## 2012-05-03 DIAGNOSIS — I2699 Other pulmonary embolism without acute cor pulmonale: Secondary | ICD-10-CM

## 2012-05-03 DIAGNOSIS — Z7901 Long term (current) use of anticoagulants: Secondary | ICD-10-CM

## 2012-05-03 DIAGNOSIS — R0989 Other specified symptoms and signs involving the circulatory and respiratory systems: Secondary | ICD-10-CM

## 2012-05-03 LAB — CBC WITH DIFFERENTIAL/PLATELET
Basophils Absolute: 0 10*3/uL (ref 0.0–0.1)
HCT: 37.7 % (ref 36.0–46.0)
Hemoglobin: 12.6 g/dL (ref 12.0–15.0)
Lymphocytes Relative: 42 % (ref 12–46)
MCV: 90.4 fL (ref 78.0–100.0)
Monocytes Relative: 4 % (ref 3–12)
Neutro Abs: 4.3 10*3/uL (ref 1.7–7.7)
Neutrophils Relative %: 52 % (ref 43–77)
Platelets: 270 10*3/uL (ref 150–400)
RBC: 4.17 MIL/uL (ref 3.87–5.11)
RDW: 14.9 % (ref 11.5–15.5)
WBC: 8.2 10*3/uL (ref 4.0–10.5)

## 2012-05-03 LAB — POCT INR: INR: 1.2

## 2012-05-03 MED ORDER — ZOLPIDEM TARTRATE 10 MG PO TABS: 5.0000 mg | ORAL_TABLET | Freq: Every evening | ORAL | Status: DC | PRN

## 2012-05-03 MED ORDER — MIRTAZAPINE 15 MG PO TABS: 15.0000 mg | ORAL_TABLET | Freq: Every day | ORAL | Status: DC

## 2012-05-03 MED ORDER — ZOLPIDEM TARTRATE 10 MG PO TABS: 10.0000 mg | ORAL_TABLET | Freq: Every evening | ORAL | Status: DC | PRN

## 2012-05-03 NOTE — Patient Instructions (Addendum)
Will get labs today and I will call you with any abnormal results Please stop smoking as it will worsen your COPD and increase your risk of cancer especially throat cancer Follow up with Dr. Loistine Chance in 1 month

## 2012-05-03 NOTE — Assessment & Plan Note (Addendum)
This has been a chronic problem for patient in the past 6-7 years. With her hoarseness as well as a history of smoking, malignancy is in the differential. Patient had a normal barium study in October 2012. She denies any dysphasia or odynophagia. She has been evaluated by pulmonology by Dr. Sherene Sires and was given a trial of prednisone, PPI , H2 blocker, and Delsym.  Given that her symptoms are not improving, next up is to refer patient to an ENT physician for further evaluation and treatment. -Will refer to ENT -In the meantime I would continue her PPI and H2 blocker -Again I advised patient to stop smoking

## 2012-05-03 NOTE — Progress Notes (Signed)
HPI: Ms. Crichton is a 51 yo woman with PMH of depression, HLP, recurrent PE, COPD presents today for her throat problems.  Her throat is irritated, clearing throat a lot X 6-7 years. Her voice is hoarse.  No dysphagia or odynophagia.  No weightloss.  She feels like food is sticking in her throat, had neg barium study.   She is irritated and wants this to be fixed. She tried 1 month trial of protonix and pepcid and did not feel like it improved at all.  She states that the Delsym helps with the clearing of her throat.  She denies any fever, n/v. Occasional chills. Does have some dizziness today.   She was given delsym, prednisone, protonix and Pepcid by Dr. Sherene Sires. She continues to smoke.   ROS: as per HPI  PE: General: alert, well-developed, and cooperative to examination.   Mouth: pharynx pink and moist, no erythema, and no exudates.  Lungs: normal respiratory effort, no accessory muscle use, normal breath sounds, no crackles, and no wheezes. Heart: normal rate, regular rhythm, no murmur, no gallop, and no rub.  Abdomen: soft, non-tender, normal bowel sounds, no distention, no guarding, no rebound tenderness Msk: no joint swelling, no joint warmth, and no redness over joints.  Extremities: No cyanosis, clubbing, + trace edema Neurologic: alert & oriented X3, cranial nerves II-XII intact, strength normal in all extremities, sensation intact to light touch, and gait normal.  Psych: Oriented X3, memory intact for recent and remote, normally interactive, good eye contact, +anxious appearing, and not depressed appearing.

## 2012-05-03 NOTE — Assessment & Plan Note (Signed)
Patient reports that she still has problem with insomnia. She states that she needs Ambien 10 mg to have her sleep. I gave her 30 day supply of Ambien 10 mg with no refill.

## 2012-05-03 NOTE — Assessment & Plan Note (Addendum)
Review of patient's vitals and appear that patient has a history of soft blood pressure and this maybe where she lives.  Unclear etiology, unlikely infection as patient is afebrile and no clear source of infection.  I do not see a cortisol level in her chart to rule out adrenal insufficiency. -Will order a random cortisol level -Will check a BMP, CBC -encourage oral fluids -Orthostatic vitals: Normal

## 2012-05-03 NOTE — Assessment & Plan Note (Signed)
Patient requested for Remeron because it helps with her depression as well as her insomnia. -Refill Remeron 15 mg by mouth daily with no refill. -Patient will need to followup with psychiatry

## 2012-05-04 ENCOUNTER — Telehealth: Payer: Self-pay | Admitting: Internal Medicine

## 2012-05-04 ENCOUNTER — Telehealth: Payer: Self-pay | Admitting: *Deleted

## 2012-05-04 LAB — BASIC METABOLIC PANEL WITH GFR
CO2: 28 mEq/L (ref 19–32)
Calcium: 9.4 mg/dL (ref 8.4–10.5)
Chloride: 109 mEq/L (ref 96–112)
Creat: 0.94 mg/dL (ref 0.50–1.10)
GFR, Est Non African American: 72 mL/min
Sodium: 143 mEq/L (ref 135–145)

## 2012-05-04 LAB — CORTISOL: Cortisol, Plasma: 6.3 ug/dL

## 2012-05-04 NOTE — Telephone Encounter (Signed)
We are trying to contact patient today to bring her back to clinic.  I reviewed her chart and she has been having hypoglycemia in the 50-60's over the past year.  Will work up for hypoglycemia with C-peptide, insulin, proinsulin, sulfonylurea, beta hydroxybutyrate. Also will instruct patient to eat a big meal to bring her sugar up.  Case discussed with Dr. Criselda Peaches

## 2012-05-04 NOTE — Telephone Encounter (Signed)
[facility] called at 1 am- for critical lab value- glucose 40. Patient was seen earlier yesterday in clinic. - Unclear if calling patient this time would be helpful. - Will send a note to Dr. Anselm Jungling.

## 2012-05-04 NOTE — Telephone Encounter (Signed)
Attempts to call pt to notify her of need to come in for labs.  Unable to reach pt x4.  Message left on pt's son's voicemail  Mare Loan  to call the Clinics and to ask for [place].  Angelina Ok, RN 05/04/2012 12:09 PM.

## 2012-05-05 ENCOUNTER — Telehealth: Payer: Self-pay | Admitting: *Deleted

## 2012-05-05 NOTE — Telephone Encounter (Signed)
Unable to reach pt by phone.  Message was left on pt's home phone number to call the Clinics.  Cell phone not working.  Call to pt's son who is her contact asked son to have pt call the Clinics as soon as possible and to come on Monday for labs.  Son was also encouraged to have patient eat. Pt's son Ruby Cola to contact pt.  Dr. Anselm Jungling was informed of.  Angelina Ok, RN 05/05/2012. 4:32 PM.

## 2012-05-08 ENCOUNTER — Encounter: Payer: Self-pay | Admitting: Internal Medicine

## 2012-05-08 ENCOUNTER — Ambulatory Visit (INDEPENDENT_AMBULATORY_CARE_PROVIDER_SITE_OTHER): Payer: Medicaid Other | Admitting: Internal Medicine

## 2012-05-08 ENCOUNTER — Ambulatory Visit (INDEPENDENT_AMBULATORY_CARE_PROVIDER_SITE_OTHER): Payer: Medicaid Other | Admitting: Pharmacist

## 2012-05-08 VITALS — BP 103/70 | HR 95 | Temp 98.0°F | Wt 144.0 lb

## 2012-05-08 DIAGNOSIS — R070 Pain in throat: Secondary | ICD-10-CM

## 2012-05-08 DIAGNOSIS — I2699 Other pulmonary embolism without acute cor pulmonale: Secondary | ICD-10-CM

## 2012-05-08 DIAGNOSIS — E162 Hypoglycemia, unspecified: Secondary | ICD-10-CM

## 2012-05-08 DIAGNOSIS — Z7901 Long term (current) use of anticoagulants: Secondary | ICD-10-CM

## 2012-05-08 DIAGNOSIS — Z5181 Encounter for therapeutic drug level monitoring: Secondary | ICD-10-CM

## 2012-05-08 LAB — COMPLETE METABOLIC PANEL WITH GFR
AST: 14 U/L (ref 0–37)
Alkaline Phosphatase: 82 U/L (ref 39–117)
BUN: 11 mg/dL (ref 6–23)
Creat: 0.73 mg/dL (ref 0.50–1.10)
GFR, Est Non African American: >89 mL/min
Glucose, Bld: 103 mg/dL — ABNORMAL HIGH (ref 70–99)
Potassium: 3.4 mEq/L — ABNORMAL LOW (ref 3.5–5.3)
Total Bilirubin: 0.2 mg/dL — ABNORMAL LOW (ref 0.3–1.2)

## 2012-05-08 LAB — TSH: TSH: 0.714 u[IU]/mL (ref 0.350–4.500)

## 2012-05-08 LAB — CBC WITH DIFFERENTIAL/PLATELET
Basophils Absolute: 0 10*3/uL (ref 0.0–0.1)
Eosinophils Relative: 2 % (ref 0–5)
HCT: 37.4 % (ref 36.0–46.0)
Hemoglobin: 13.3 g/dL (ref 12.0–15.0)
Lymphocytes Relative: 35 % (ref 12–46)
Lymphs Abs: 2.8 10*3/uL (ref 0.7–4.0)
MCV: 86.8 fL (ref 78.0–100.0)
Monocytes Absolute: 0.6 10*3/uL (ref 0.1–1.0)
Neutro Abs: 4.5 10*3/uL (ref 1.7–7.7)
RBC: 4.31 MIL/uL (ref 3.87–5.11)
WBC: 8 10*3/uL (ref 4.0–10.5)

## 2012-05-08 LAB — POCT INR: INR: 2.3

## 2012-05-08 LAB — C-PEPTIDE: C-Peptide: 4.38 ng/mL — ABNORMAL HIGH (ref 0.80–3.90)

## 2012-05-08 MED ORDER — TRAMADOL HCL 50 MG PO TABS: ORAL_TABLET | ORAL | Status: DC

## 2012-05-08 NOTE — Patient Instructions (Signed)
Patient instructed to take medications as defined in the Anti-coagulation Track section of this encounter.  Patient instructed to take today's dose.  Patient verbalized understanding of these instructions.    

## 2012-05-08 NOTE — Progress Notes (Signed)
HPI: Ms. 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  Her blood glucose was found to be 40 on 9/11 and our office has been trying to contact her since and she finally came in today.  She has not been sleeping.  She has been trying to eat more especially on Remeron.  She does not think Remeron is working.  She has not seen by ENT.  She is having a headache right now, just not feeling good overall with chills and sweat.  Feels a little dizzy but ok.  ROS: as per HPI  PE: General: alert, well-developed, and cooperative to examination Thyroid: no mass noted.   Lungs: normal respiratory effort, no accessory muscle use, normal breath sounds, no crackles, and no wheezes. Heart: normal rate, regular rhythm, no murmur, no gallop, and no rub.  Abdomen: soft, left upper quadrant tenderness on palpation, normal bowel sounds, no distention, no guarding, no rebound tenderness Msk: no joint swelling, no joint warmth, and no redness over joints.  Pulses: 2+ DP/PT pulses bilaterally Extremities: No cyanosis, clubbing, edema Neurologic: alert & oriented X3, cranial nerves II-XII intact, strength normal in all extremities, sensation intact to light touch, and gait normal.  Skin: turgor normal and no rashes.  Psych: Oriented X3, memory intact for recent and remote, normally interactive, good eye contact, + anxious appearing, and not depressed appearing.

## 2012-05-08 NOTE — Assessment & Plan Note (Addendum)
Unclear etiology.  This may be 2/2 to malnourishment but need to rule out other causes including insulinoma or factitious insulin injection or sulfonylureas.  She denies taking those meds.  She did eat today and her blood sugar on arrival was 98.   -Will get labs: insulin, pro-insulin, c peptide, beta hydroxybutyrate, sulfonylyurea -Instruct patient to eat and keep hydrated -If she is symptomatic, she needs to take glucose tablet, drink juice or eat crackers -Will check CMP & CBC today -Follow up in 1 week

## 2012-05-08 NOTE — Progress Notes (Signed)
Anti-Coagulation Progress Note  Emily Cameron is a 51 y.o. female who is currently on an anti-coagulation regimen.    RECENT RESULTS: Recent results are below, the most recent result is correlated with a dose of 42.5 mg. per week: Lab Results  Component Value Date   INR 2.3 05/08/2012   INR 1.2 05/03/2012   INR 3.20 03/27/2012    ANTI-COAG DOSE:   Latest dosing instructions   Total Sun Mon Tue Wed Thu Fri Sat   42.5 5 mg 7.5 mg 5 mg 7.5 mg 5 mg 7.5 mg 5 mg    (5 mg1) (5 mg1.5) (5 mg1) (5 mg1.5) (5 mg1) (5 mg1.5) (5 mg1)         ANTICOAG SUMMARY: Anticoagulation Episode Summary              Current INR goal 2.0-3.0 Next INR check 06/05/2012   INR from last check 2.3 (05/08/2012)     Weekly max dose (mg)  Target end date Indefinite   Indications Pulmonary embolism, Monitoring for long-term anticoagulant use   IN[facility] check [facility]    Send INR reminders to    Comments 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             ANTICOAG TODAY: Anticoagulation Summary as of 05/08/2012              INR goal 2.0-3.0     Selected INR 2.3 (05/08/2012) Next INR check 06/05/2012   Weekly max dose (mg)  Target end date Indefinite   Indications Pulmonary embolism, Monitoring for long-term anticoagulant use    Anticoagulation Episode Summary              INR check [facility] lab    Send INR reminders to    Comments 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             PATIENT INSTRUCTIONS: Patient Instructions  Patient instructed to take medications as defined in the Anti-coagulation Track section of this encounter.  Patient instructed to take today's dose.  Patient verbalized understanding of these instructions.        FOLLOW-UP Return in 4 weeks (on 06/05/2012) for Follow up INR at 2:30PM.  Hulen Luster, III Pharm.D., CACP

## 2012-05-08 NOTE — Patient Instructions (Addendum)
Make sure you eat and keep hydrated: take glucose tablets, eat crackers, drink orange juice when you feel shaky, sweating Follow up in 1 week with Dr. Anselm Jungling

## 2012-05-09 NOTE — Progress Notes (Signed)
Agree 

## 2012-05-15 LAB — PROINSULIN/INSULIN RATIO
Insulin: 49 u[IU]/mL — ABNORMAL HIGH (ref 3–19)
Proinsulin/Insulin Ratio: 6.9 %

## 2012-05-16 ENCOUNTER — Other Ambulatory Visit: Payer: Self-pay | Admitting: Internal Medicine

## 2012-05-16 DIAGNOSIS — E162 Hypoglycemia, unspecified: Secondary | ICD-10-CM

## 2012-05-18 LAB — SULFONYLUREA HYPOGLYCEMICS PANEL, SERUM

## 2012-05-22 ENCOUNTER — Telehealth: Payer: Self-pay | Admitting: Licensed Clinical Social Worker

## 2012-05-22 NOTE — Telephone Encounter (Signed)
CSW received message this morning from Ms. Emily Cameron requesting CSW to return call.  CSW returned call and spoke with pt.  Ms. Emily Cameron states she was having "a bad morning and needed to talk".  Pt wanting to schedule appt to come to office.  Pt is established at [facility].  CSW encouraged pt to contact [facility] as she is current with them.  [facility] offers walk-in hours and provided phone number.  Ms. Emily Cameron states she missed her last appt with [facility] because she has a hard time remembering appt's.  CSW informed pt of appt at [facility] tomorrow and discussed referral to [facility].  Pt in agreement.  CSW faxed referral to [facility] to assist with care coordination.

## 2012-05-23 ENCOUNTER — Ambulatory Visit (INDEPENDENT_AMBULATORY_CARE_PROVIDER_SITE_OTHER): Payer: Medicaid Other | Admitting: Internal Medicine

## 2012-05-23 ENCOUNTER — Encounter: Payer: Self-pay | Admitting: Internal Medicine

## 2012-05-23 VITALS — BP 108/64 | HR 80 | Temp 97.3°F | Ht 64.0 in | Wt 140.2 lb

## 2012-05-23 DIAGNOSIS — R0989 Other specified symptoms and signs involving the circulatory and respiratory systems: Secondary | ICD-10-CM

## 2012-05-23 DIAGNOSIS — R070 Pain in throat: Secondary | ICD-10-CM

## 2012-05-23 DIAGNOSIS — R0602 Shortness of breath: Secondary | ICD-10-CM

## 2012-05-23 DIAGNOSIS — F419 Anxiety disorder, unspecified: Secondary | ICD-10-CM

## 2012-05-23 DIAGNOSIS — Z720 Tobacco use: Secondary | ICD-10-CM

## 2012-05-23 DIAGNOSIS — G4700 Insomnia, unspecified: Secondary | ICD-10-CM

## 2012-05-23 DIAGNOSIS — F3289 Other specified depressive episodes: Secondary | ICD-10-CM

## 2012-05-23 DIAGNOSIS — J449 Chronic obstructive pulmonary disease, unspecified: Secondary | ICD-10-CM

## 2012-05-23 DIAGNOSIS — F329 Major depressive disorder, single episode, unspecified: Secondary | ICD-10-CM

## 2012-05-23 DIAGNOSIS — R6889 Other general symptoms and signs: Secondary | ICD-10-CM

## 2012-05-23 MED ORDER — AMITRIPTYLINE HCL 25 MG PO TABS: 25.0000 mg | ORAL_TABLET | Freq: Every day | ORAL | Status: DC

## 2012-05-23 MED ORDER — TRAMADOL HCL 50 MG PO TABS: 50.0000 mg | ORAL_TABLET | ORAL | Status: DC | PRN

## 2012-05-23 MED ORDER — DEXTROMETHORPHAN POLISTIREX 30 MG/5ML PO LQCR: 15.0000 mg | Freq: Two times a day (BID) | ORAL | Status: DC

## 2012-05-23 MED ORDER — ALBUTEROL SULFATE HFA 108 (90 BASE) MCG/ACT IN AERS: 2.0000 | INHALATION_SPRAY | Freq: Four times a day (QID) | RESPIRATORY_TRACT | Status: DC | PRN

## 2012-05-23 MED ORDER — PANTOPRAZOLE SODIUM 40 MG PO TBEC: 40.0000 mg | DELAYED_RELEASE_TABLET | Freq: Every day | ORAL | Status: DC

## 2012-05-23 MED ORDER — FAMOTIDINE 20 MG PO TABS: ORAL_TABLET | ORAL | Status: DC

## 2012-05-23 MED ORDER — DEXTROMETHORPHAN POLISTIREX 30 MG/5ML PO LQCR: 75.0000 mg | Freq: Two times a day (BID) | ORAL | Status: DC

## 2012-05-23 NOTE — Assessment & Plan Note (Signed)
No SI/HI, reports that concern of throat cancer is contributing to her anxiety and depressive mood. Next appt at [facility] tomorrow.  D/c Remeron as pt reports not working for mood or appetite stimulant. -start Elavil 25 mg qhs with up titration.

## 2012-05-23 NOTE — Assessment & Plan Note (Signed)
Long standing, likely secondary to anxiety, have d/c Remeron and Ambien.  -start Elavil 25 mg qhs x 5days then 50 mg qhs -up titrate thereafter -next evaluation by [facility] tomorrow, pt encouraged to keep appt

## 2012-05-23 NOTE — Patient Instructions (Addendum)
I have included the prior instructions from Dr. Sherene Sires (the lung specialist)  It is important to stop smoking.   Take delsym two tsp every 12 hours and supplement if needed with tramadol 50 mg up to 2 every 4 hours to suppress the urge to cough.   Swallowing water or using ice chips/non mint and menthol containing candies (such as lifesavers or sugarless jolly ranchers) are also effective.   You should rest your voice and avoid activities that you know make you cough.   Once you have eliminated the cough for 3 straight days try reducing the tramadol first, then the delsym as tolerated.   Try pantoprazole 40 mg Take 30-60 min before first meal of the day and Pepcid 20 mg one bedtime for 1 month until you see the throat doctor.  GERD (REFLUX) is an extremely common cause of respiratory symptoms, many times with no significant heartburn at all.   It can be treated with medication, but also with lifestyle changes including avoidance of late meals, excessive alcohol, smoking cessation, and avoid fatty foods, chocolate, peppermint, colas, red wine, and acidic juices such as orange juice.  NO MINT OR MENTHOL PRODUCTS SO NO COUGH DROPS  USE SUGARLESS CANDY INSTEAD (jolley ranchers or Stover's)  NO OIL BASED VITAMINS - use powdered substitutes  A referral for ENT (throat doctor) has been made for you.  We will call when you get an appointment. A have stopped the Remeron and Ambien since it is not helping you.    I will start Amitriptyline for you.  Take 1 pill at bedtime for 5 days then increase to 2 pills at bedtime.  Keep your appointment with [facility] and return to clinic after your throat doctor appointment.

## 2012-05-23 NOTE — Progress Notes (Signed)
  Subjective:    Patient ID: Emily Cameron, female    DOB: Aug 28, 1960, 51 y.o.   MRN: [id number]  HPI  Pt with chronic throat clearing, depression and anxiety presents for follow-up after evaluation by pulmonology.  Pt states that she has not done any of the recommendation advised by Dr. Sherene Sires with exception of the prednisone taper.  States that she was confused by the details and is nervous that she may have cancer.  As for her depression, she states that its doing a little better but she is still having trouble sleeping on the Remeron and Ambien.  States that she took "2 Ambien's" the night prior which still did not help her insomnia.  Scheduled to see counselor at [facility].  Review of Systems As per HPI otherwise grossly negative    Objective:   Physical Exam  Constitutional: She is oriented to person, place, and time. She appears well-developed and well-nourished. No distress.  HENT:  Head: Normocephalic and atraumatic.  Neck: Normal range of motion. Neck supple. No thyromegaly present.  Cardiovascular: Normal rate, regular rhythm and normal heart sounds.   Pulmonary/Chest: Effort normal. No stridor. No respiratory distress. She has no wheezes.  Lymphadenopathy:    She has no cervical adenopathy.  Neurological: She is alert and oriented to person, place, and time.  Skin: Skin is dry.  Psychiatric: She has a normal mood and affect.          Assessment & Plan:  1. Chronic throat clearing: re-emphasized advice by Dr. Sherene Sires 2. Insomnia: stopped Ambien 3. Depression/ Anxiety: f/u [facility], d/c Remeron, start Elavil  -see detailed problem list

## 2012-05-23 NOTE — Assessment & Plan Note (Addendum)
Pt has not followed through on recommendations of Dr. Sherene Sires (Pulmonary) with exception of prednisone taper.  I have reviewed these instructions again with patient. I will also refer to ENT as this has been ongoing for years and is cause of much anxiety. -Delsym 2.5 tsp q12h -Tramadol 50mg  q4h prn  Cough/throat clearing -appt with ENT Oct 2013

## 2012-05-25 NOTE — Progress Notes (Signed)
Pt seen in clinic on 10/1

## 2012-06-05 ENCOUNTER — Ambulatory Visit: Payer: Medicaid Other

## 2012-06-16 ENCOUNTER — Other Ambulatory Visit: Payer: Self-pay | Admitting: Internal Medicine

## 2012-06-26 ENCOUNTER — Ambulatory Visit (INDEPENDENT_AMBULATORY_CARE_PROVIDER_SITE_OTHER): Payer: Medicaid Other | Admitting: Pharmacist

## 2012-06-26 ENCOUNTER — Other Ambulatory Visit: Payer: Medicaid Other

## 2012-06-26 DIAGNOSIS — Z7901 Long term (current) use of anticoagulants: Secondary | ICD-10-CM

## 2012-06-26 DIAGNOSIS — Z5181 Encounter for therapeutic drug level monitoring: Secondary | ICD-10-CM

## 2012-06-26 DIAGNOSIS — I2699 Other pulmonary embolism without acute cor pulmonale: Secondary | ICD-10-CM

## 2012-06-26 LAB — POCT INR: INR: 4.1

## 2012-06-26 NOTE — Progress Notes (Signed)
Anti-Coagulation Progress Note  Emily Cameron is a 51 y.o. female who is currently on an anti-coagulation regimen.    RECENT RESULTS: Recent results are below, the most recent result is correlated with a dose of 42.5 mg. per week: Lab Results  Component Value Date   INR 4.10 06/26/2012   INR 2.3 05/08/2012   INR 1.2 05/03/2012    ANTI-COAG DOSE:   Latest dosing instructions   Total Sun Mon Tue Wed Thu Fri Sat   40 5 mg 7.5 mg 5 mg 5 mg 7.5 mg 5 mg 5 mg    (5 mg1) (5 mg1.5) (5 mg1) (5 mg1) (5 mg1.5) (5 mg1) (5 mg1)         ANTICOAG SUMMARY: Anticoagulation Episode Summary              Current INR goal 2.0-3.0 Next INR check 07/10/2012   INR from last check 4.10! (06/26/2012)     Weekly max dose (mg)  Target end date Indefinite   Indications Pulmonary embolism [415.19], Monitoring for long-term anticoagulant use [V58.61]   INR check location [facility] Preferred lab    Send INR reminders to    Comments 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             ANTICOAG TODAY: Anticoagulation Summary as of 06/26/2012              INR goal 2.0-3.0     Selected INR 4.10! (06/26/2012) Next INR check 07/10/2012   Weekly max dose (mg)  Target end date Indefinite   Indications Pulmonary embolism [415.19], Monitoring for long-term anticoagulant use [V58.61]    Anticoagulation Episode Summary              INR check location [facility] Preferred lab    Send INR reminders to    Comments 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             PATIENT INSTRUCTIONS: Patient Instructions  Patient instructed to take medications as defined in the Anti-coagulation Track section of this encounter.  Patient instructed to OMIT today's dose.  Patient verbalized understanding of these instructions.        FOLLOW-UP Return in 2 weeks (on 07/10/2012) for Follow up INR at 3:45PM.  Hulen Luster, III Pharm.D., CACP

## 2012-06-26 NOTE — Patient Instructions (Signed)
Patient instructed to take medications as defined in the Anti-coagulation Track section of this encounter.  Patient instructed to OMIT today's dose.  Patient verbalized understanding of these instructions.    

## 2012-07-05 ENCOUNTER — Other Ambulatory Visit: Payer: Self-pay | Admitting: Internal Medicine

## 2012-07-10 ENCOUNTER — Encounter: Payer: Medicaid Other | Admitting: Internal Medicine

## 2012-07-10 ENCOUNTER — Ambulatory Visit: Payer: Medicaid Other

## 2012-08-03 ENCOUNTER — Telehealth: Payer: Self-pay | Admitting: *Deleted

## 2012-08-03 NOTE — Telephone Encounter (Signed)
Glenda from dr byers office calls and states pt is to have a procedure soon and needs to be off coumadin

## 2012-08-08 NOTE — Telephone Encounter (Signed)
Patient need to be life long on Warfarin. I will need to talk to dr Jearld Fenton about the procedure.  Thanks Emily Cameron

## 2012-08-08 NOTE — Telephone Encounter (Signed)
I do not know pt but chart review states PE x 2 most recent in 2011 so would have received full yr. Would check with PCP or Dr Alexandria Lodge. Did they say what type of sedation, length of procedure? Thanks

## 2012-08-08 NOTE — Telephone Encounter (Signed)
Dr Jearld Fenton is doing a vocal cord procedure for a polyp in the near future and his office would like to make sure pt could come off coumadin for a period of time could you please advise, dr byers ph# [phone], procedure will definitely be after Christmas but need coumadin advisement now

## 2012-08-09 ENCOUNTER — Other Ambulatory Visit: Payer: Self-pay | Admitting: Otolaryngology

## 2012-08-11 NOTE — Telephone Encounter (Signed)
Called office . Informed that patient is not a candidate to be off coumadin due to recurrent PEs. Consider admitting patient and place on Heparin and proceed with vocal cord procedure would be my recommendation.

## 2012-08-24 ENCOUNTER — Encounter (HOSPITAL_COMMUNITY): Admission: RE | Payer: Self-pay | Source: Ambulatory Visit

## 2012-08-24 ENCOUNTER — Ambulatory Visit (HOSPITAL_COMMUNITY): Admission: RE | Admit: 2012-08-24 | Payer: Medicaid Other | Source: Ambulatory Visit | Admitting: Otolaryngology

## 2012-08-24 SURGERY — MICROLARYNGOSCOPY
Anesthesia: General

## 2012-09-13 ENCOUNTER — Encounter: Payer: Self-pay | Admitting: Internal Medicine

## 2012-12-29 ENCOUNTER — Ambulatory Visit: Payer: Medicaid Other | Admitting: Internal Medicine

## 2013-01-04 ENCOUNTER — Encounter: Payer: Self-pay | Admitting: Internal Medicine

## 2013-01-04 ENCOUNTER — Other Ambulatory Visit: Payer: Self-pay | Admitting: Internal Medicine

## 2013-04-30 ENCOUNTER — Encounter (HOSPITAL_COMMUNITY): Payer: Self-pay | Admitting: Emergency Medicine

## 2013-04-30 ENCOUNTER — Emergency Department (HOSPITAL_COMMUNITY)
Admission: EM | Admit: 2013-04-30 | Discharge: 2013-05-01 | Disposition: A | Discharge status: Home / Self Care | Payer: Self-pay | Attending: Emergency Medicine | Admitting: Emergency Medicine

## 2013-04-30 ENCOUNTER — Emergency Department (HOSPITAL_COMMUNITY): Payer: Self-pay

## 2013-04-30 DIAGNOSIS — F3289 Other specified depressive episodes: Secondary | ICD-10-CM | POA: Insufficient documentation

## 2013-04-30 DIAGNOSIS — G4700 Insomnia, unspecified: Secondary | ICD-10-CM | POA: Insufficient documentation

## 2013-04-30 DIAGNOSIS — F32A Depression, unspecified: Secondary | ICD-10-CM

## 2013-04-30 DIAGNOSIS — F17200 Nicotine dependence, unspecified, uncomplicated: Secondary | ICD-10-CM | POA: Insufficient documentation

## 2013-04-30 DIAGNOSIS — Z86711 Personal history of pulmonary embolism: Secondary | ICD-10-CM | POA: Insufficient documentation

## 2013-04-30 DIAGNOSIS — F411 Generalized anxiety disorder: Secondary | ICD-10-CM | POA: Insufficient documentation

## 2013-04-30 DIAGNOSIS — R450 Nervousness: Secondary | ICD-10-CM | POA: Insufficient documentation

## 2013-04-30 DIAGNOSIS — F329 Major depressive disorder, single episode, unspecified: Secondary | ICD-10-CM | POA: Diagnosis present

## 2013-04-30 DIAGNOSIS — Z79899 Other long term (current) drug therapy: Secondary | ICD-10-CM | POA: Insufficient documentation

## 2013-04-30 DIAGNOSIS — F419 Anxiety disorder, unspecified: Secondary | ICD-10-CM

## 2013-04-30 DIAGNOSIS — R45851 Suicidal ideations: Secondary | ICD-10-CM

## 2013-04-30 DIAGNOSIS — Z7901 Long term (current) use of anticoagulants: Secondary | ICD-10-CM | POA: Insufficient documentation

## 2013-04-30 LAB — RAPID URINE DRUG SCREEN, HOSP PERFORMED
Amphetamines: NOT DETECTED
Benzodiazepines: NOT DETECTED
Cocaine: NOT DETECTED
Opiates: NOT DETECTED

## 2013-04-30 LAB — CBC
MCHC: 35.7 g/dL (ref 30.0–36.0)
MCV: 89.7 fL (ref 78.0–100.0)
Platelets: 200 10*3/uL (ref 150–400)
RDW: 14.8 % (ref 11.5–15.5)
WBC: 7.3 10*3/uL (ref 4.0–10.5)

## 2013-04-30 LAB — ETHANOL: Alcohol, Ethyl (B): <11 mg/dL (ref 0–11)

## 2013-04-30 LAB — COMPREHENSIVE METABOLIC PANEL
AST: 20 U/L (ref 0–37)
Albumin: 3.9 g/dL (ref 3.5–5.2)
BUN: 6 mg/dL (ref 6–23)
Chloride: 105 mEq/L (ref 96–112)
Creatinine, Ser: 0.66 mg/dL (ref 0.50–1.10)
Total Bilirubin: 0.3 mg/dL (ref 0.3–1.2)
Total Protein: 7.8 g/dL (ref 6.0–8.3)

## 2013-04-30 LAB — ACETAMINOPHEN LEVEL: Acetaminophen (Tylenol), Serum: <15 ug/mL (ref 10–30)

## 2013-04-30 LAB — SALICYLATE LEVEL: Salicylate Lvl: <2 mg/dL — ABNORMAL LOW (ref 2.8–20.0)

## 2013-04-30 LAB — PROTIME-INR
INR: 1 (ref 0.00–1.49)
Prothrombin Time: 13 seconds (ref 11.6–15.2)

## 2013-04-30 IMAGING — CR DG CHEST 2V
2 series · 2 of 2 positions shown · non-contrast
Comparison: 02/21/2012

CLINICAL DATA: Cough and chest congestion.  Shortness of breath and
chills

CHEST - 2 VIEW

[w chest pa]
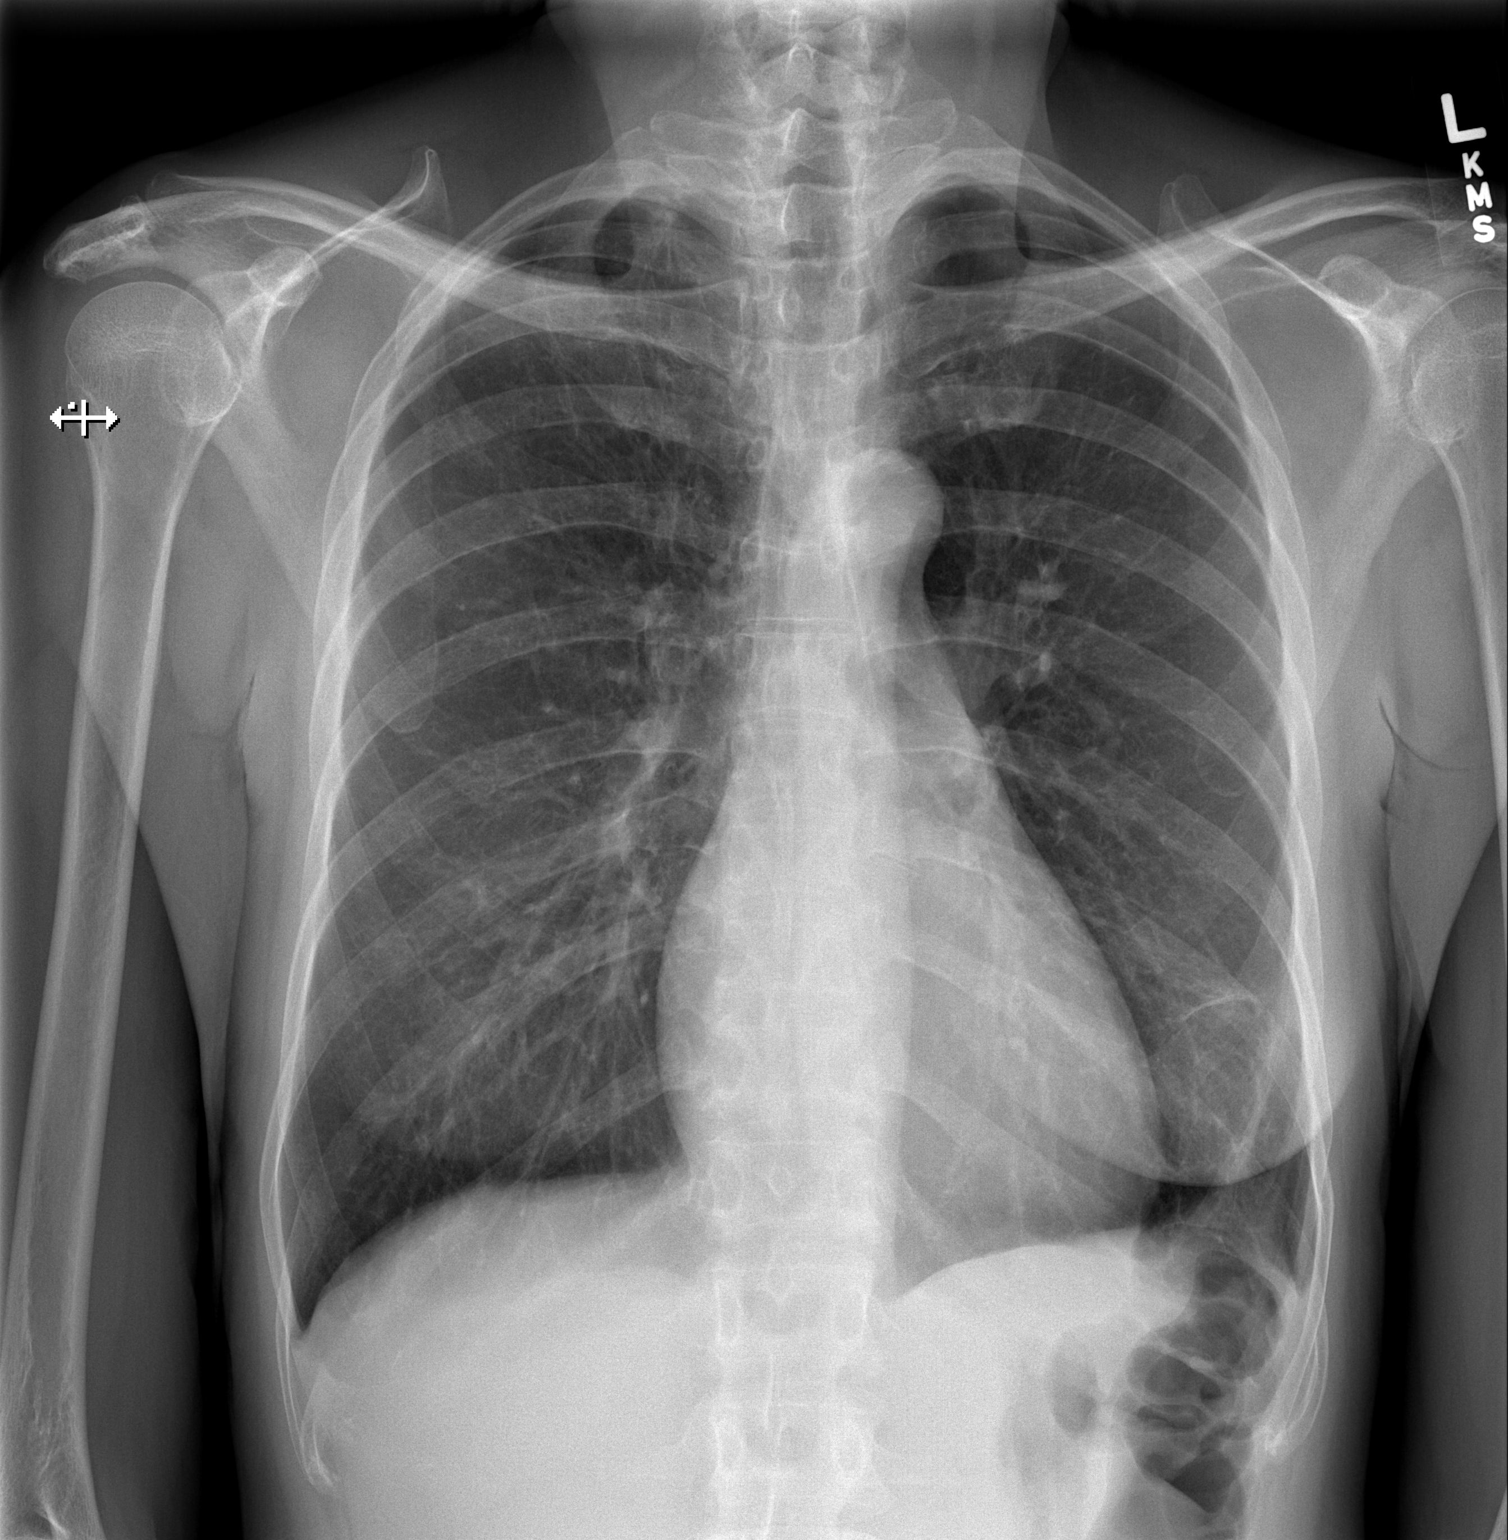

[w chest lat]
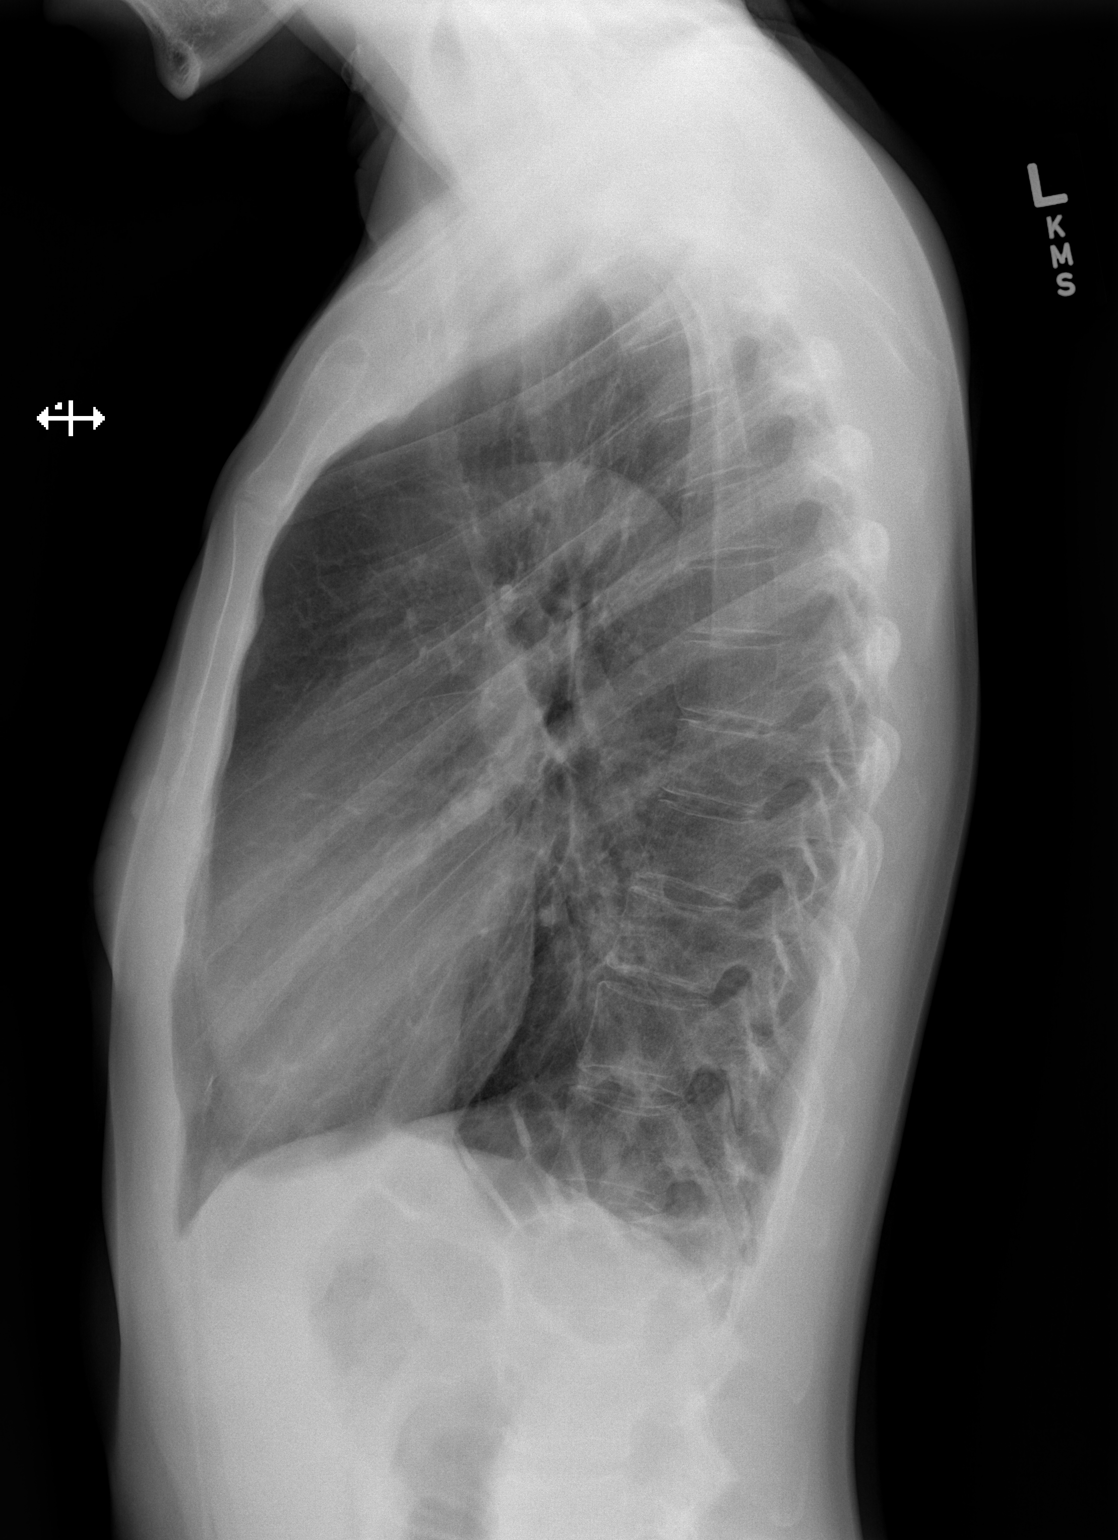

[2 of 2 positions shown; findings below may reference images not displayed]

FINDINGS: Normal heart size.  There is no pleural effusion or edema
identified.  Scarring is identified in the left base.  There is
tenting of the left hemidiaphragm.  The lungs are hyperinflated but
clear.  No airspace consolidation identified.
IMPRESSION: 1.  No acute cardiopulmonary abnormalities.

## 2013-04-30 MED ORDER — ALBUTEROL SULFATE HFA 108 (90 BASE) MCG/ACT IN AERS: 2.0000 | INHALATION_SPRAY | Freq: Four times a day (QID) | RESPIRATORY_TRACT | Status: DC | PRN

## 2013-04-30 MED ORDER — ACETAMINOPHEN 325 MG PO TABS: 650.0000 mg | ORAL_TABLET | ORAL | Status: DC | PRN

## 2013-04-30 MED ORDER — NICOTINE 21 MG/24HR TD PT24
21.0000 mg | MEDICATED_PATCH | Freq: Every day | TRANSDERMAL | Status: DC
  Administered 2013-04-30 – 2013-05-01 (×2): 21 mg via TRANSDERMAL
  Filled 2013-04-30: qty 1

## 2013-04-30 MED ORDER — LORAZEPAM 1 MG PO TABS
1.0000 mg | ORAL_TABLET | Freq: Three times a day (TID) | ORAL | Status: DC | PRN
  Administered 2013-04-30 – 2013-05-01 (×2): 1 mg via ORAL
  Filled 2013-04-30 (×2): qty 1

## 2013-04-30 MED ORDER — WARFARIN SODIUM 7.5 MG PO TABS
7.5000 mg | ORAL_TABLET | Freq: Once | ORAL | Status: AC
  Administered 2013-04-30: 7.5 mg via ORAL
  Filled 2013-04-30: qty 1

## 2013-04-30 MED ORDER — DOXYLAMINE SUCCINATE (SLEEP) 25 MG PO TABS
25.0000 mg | ORAL_TABLET | Freq: Every evening | ORAL | Status: DC | PRN
  Filled 2013-04-30: qty 1

## 2013-04-30 MED ORDER — POTASSIUM CHLORIDE CRYS ER 20 MEQ PO TBCR
40.0000 meq | EXTENDED_RELEASE_TABLET | Freq: Once | ORAL | Status: AC
  Administered 2013-04-30: 40 meq via ORAL
  Filled 2013-04-30: qty 2

## 2013-04-30 MED ORDER — WARFARIN - PHARMACIST DOSING INPATIENT: Freq: Every day | Status: DC

## 2013-04-30 MED ORDER — ONDANSETRON HCL 4 MG PO TABS
4.0000 mg | ORAL_TABLET | Freq: Three times a day (TID) | ORAL | Status: DC | PRN
  Administered 2013-05-01: 4 mg via ORAL
  Filled 2013-04-30: qty 1

## 2013-04-30 NOTE — Progress Notes (Addendum)
ANTICOAGULATION CONSULT NOTE - Initial Consult  Pharmacy Consult for Coumadin Indication: h/o PEs  No Known Allergies  Patient Measurements:    Vital Signs: Temp: 99.2 F (37.3 C) (09/08 1800) Temp src: Oral (09/08 1800) BP: 118/74 mmHg (09/08 1800) Pulse Rate: 85 (09/08 1800)  Labs: No results found for this basename: HGB, HCT, PLT, APTT, LABPROT, INR, HEPARINUNFRC, CREATININE, CKTOTAL, CKMB, TROPONINI,  in the last 72 hours  The CrCl is unknown because both a height and weight (above a minimum accepted value) are required for this calculation.   Medical History: Past Medical History  Diagnosis Date  . Anxiety   . Depression   . Insomnia   . Weight loss     due to anxiety  . PE (pulmonary embolism) 2006  . Alcohol abuse   . Polysubstance abuse     Current smoking and alcohol. 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 Marijuna not taking since 7 month.      Assessment: 35 yoF with h/o PEs supposed on be chronic Coumadin but has not been taking Coumadin for several months now given financial difficulties. Last anti-coag visit documented in [facility] was 06/26/2012 at which time patient was on Coumadin 5mg  daily except for 7.5mg  Mon/Thur and INR was 4.10 although INR has been therapeutic on this same dose and even higher previously. Unable to confirm actual dose with patient but per MD note, pt thinks she was on Coumadin 5mg  po daily. INR ordered    Goal of Therapy:  INR 2-3 Monitor platelets by anticoagulation protocol: Yes   Plan:   Will wait for INR to come back prior to dosing Coumadin  Daily PT/INR  Geoffry Paradise, PharmD, BCPS Pager: [phone] 6:40 PM Pharmacy #: [id number]   Addendum:  INR returns as 1.  CBC wnl, no bleeding   Plan:  Coumadin 7.5mg  po x 1 tonight. Pharmacy will f/u  Geoffry Paradise, PharmD, BCPS Pager: [phone] 8:12 PM Pharmacy #: [id number]

## 2013-04-30 NOTE — ED Notes (Signed)
Pt changed into blue paper scrubs. Pt and pt's belongings wanded by security.

## 2013-04-30 NOTE — ED Provider Notes (Signed)
CSN: [id number]     Arrival date & time 04/30/13  1733 History   First MD Initiated Contact with Patient 04/30/13 1756     Chief Complaint  Patient presents with  . Medical Clearance   (Consider location/radiation/quality/duration/timing/severity/associated sxs/prior Treatment) HPI Comments: Pt has a h/o PE in the past, is supposed to be on couamdin.  Last traveled up here from [facility] about 6 weeks ago.  She reports her relationship with daughter is worse which could be a contributor to worse symptoms.  Pt has had chronic problems with finances, living situation, health care.  Pt has not had coumadin in about 3-4 weeks.  She thinks she was on 5 mg daily.  She has had intermittent SOB, but no fever, chills, no pleurisy, chest or back pain.  No N/V/D.  She has had SI with plans to step in front of a car.  She texted son today asking for help.    Patient is a 52 y.o. female presenting with mental health disorder. The history is provided by the patient and a relative.  Mental Health Problem Presenting symptoms: depression and suicidal thoughts   Presenting symptoms: no homicidal ideas   Patient accompanied by:  Family member Degree of incapacity (severity):  Severe Onset quality:  Gradual Duration:  6 weeks Timing:  Constant Progression:  Worsening Chronicity:  Chronic Context: noncompliance and stressful life event   Treatment compliance:  Some of the time Time since last psychoactive medication taken:  4 weeks Relieved by:  Nothing Worsened by:  Alcohol and family interactions Associated symptoms: anhedonia, anxiety, feelings of worthlessness, insomnia and weight change   Associated symptoms: no chest pain   Risk factors: hx of suicide attempts     Past Medical History  Diagnosis Date  . Anxiety   . Depression   . Insomnia   . Weight loss     due to anxiety  . PE (pulmonary embolism) 2006  . Alcohol abuse   . Polysubstance abuse     Current smoking and alcohol. History of Cocain  abuse - not taking since 15 years. Marijuna not taking since 7 month.    Past Surgical History  Procedure Laterality Date  . Abdominal hysterectomy     Family History  Problem Relation Age of Onset  . Coronary artery disease Sister   . Heart disease Mother   . Hyperlipidemia Mother   . Hypertension Mother   . Aneurysm Brother 43  . Aneurysm Paternal Aunt 45    Died   . Heart disease Maternal Grandmother   . Pulmonary embolism Brother    History  Substance Use Topics  . Smoking status: Current Every Day Smoker -- 0.50 packs/day for 31 years    Types: Cigarettes  . Smokeless tobacco: Never Used     Comment: TO LET DOCTOR KNOW WHEN READY TO QUIT.  Marland Kitchen Alcohol Use: Yes     Comment: 2 beers a week   OB History   Grav Para Term Preterm Abortions TAB SAB Ect Mult Living                 Review of Systems  Unable to perform ROS: Psychiatric disorder  Cardiovascular: Negative for chest pain.  Psychiatric/Behavioral: Positive for suicidal ideas. Negative for homicidal ideas. The patient is nervous/anxious and has insomnia.     Allergies  Review of patient's allergies indicates no known allergies.  Home Medications   Current Outpatient Rx  Name  Route  Sig  Dispense  Refill  .  albuterol (PROVENTIL HFA) 108 (90 BASE) MCG/ACT inhaler   Inhalation   Inhale 2 puffs into the lungs every 6 (six) hours as needed for wheezing or shortness of breath.   1 Inhaler   0   . Doxylamine Succinate, Sleep, (SLEEP AID PO)   Oral   Take 2 tablets by mouth daily as needed (sleep).         . Hyprom-Naphaz-Polysorb-Zn Sulf (CLEAR EYES COMPLETE OP)   Both Eyes   Place 2 drops into both eyes as needed (dry eyes).         . warfarin (COUMADIN) 5 MG tablet      Take as directed by anticoagulation clinic provider.   50 tablet   2    BP 118/74  Pulse 85  Temp(Src) 99.2 F (37.3 C) (Oral)  Resp 12  SpO2 99%  LMP 01/26/2004 Physical Exam  Nursing note and vitals  reviewed. Constitutional: She is oriented to person, place, and time. She appears well-developed and well-nourished. No distress.  HENT:  Head: Normocephalic and atraumatic.  Eyes: Conjunctivae and EOM are normal. No scleral icterus.  Neck: Normal range of motion. Neck supple.  Cardiovascular: Normal rate, regular rhythm and intact distal pulses.   No murmur heard. Pulmonary/Chest: Effort normal. No respiratory distress. She has no wheezes. She has no rales.  Abdominal: Soft. She exhibits no distension. There is no tenderness. There is no rebound and no guarding.  Musculoskeletal: She exhibits no edema and no tenderness.  Neg Homan's  Neurological: She is alert and oriented to person, place, and time. She exhibits normal muscle tone. Coordination normal.  Skin: Skin is warm. She is not diaphoretic.    ED Course  Procedures (including critical care time) Labs Review Labs Reviewed  CBC - Abnormal; Notable for the following:    Hemoglobin 15.5 (*)    All other components within normal limits  COMPREHENSIVE METABOLIC PANEL - Abnormal; Notable for the following:    Potassium 3.3 (*)    All other components within normal limits  SALICYLATE LEVEL - Abnormal; Notable for the following:    Salicylate Lvl <2.0 (*)    All other components within normal limits  ACETAMINOPHEN LEVEL  ETHANOL  PROTIME-INR  URINE RAPID DRUG SCREEN (HOSP PERFORMED)  PROTIME-INR   Imaging Review Dg Chest 2 View  04/30/2013   *RADIOLOGY REPORT*  Clinical Data: Cough and chest congestion.  Shortness of breath and chills  CHEST - 2 VIEW  Comparison: 02/21/2012  Findings: Normal heart size.  There is no pleural effusion or edema identified.  Scarring is identified in the left base.  There is tenting of the left hemidiaphragm.  The lungs are hyperinflated but clear.  No airspace consolidation identified.  IMPRESSION:  1.  No acute cardiopulmonary abnormalities.   Original Report Authenticated By: Signa Kell, M.D.       RA sat is 99% and I interpret to be normal MDM   1. Depression   2. Suicidal ideation      Pt with poor resources, severe depression and voiced SI.  She has a h/o PE, no signs of PE today.  Pt has had intermittent SOB for a couple of weeks.  Not tachycardic, not hypoxic.  Doubt PE.  Will restart on usual coumadin dose per pharmacy.  Will get screening labs otherwise and place in psych ED for psych assessment.      Gavin Pound. Oletta Lamas, MD 04/30/13 2032

## 2013-04-30 NOTE — ED Notes (Signed)
Pt c/o SI, depression, and hopeless x2-69months.  Son reports pts sts 'she doesn't want to live' Pt denies plan.  Pt reports currently she is not on medications for depression.  Pt appears tearful.  Denies HI.  Reports she hears voices that tell her to kill herself.

## 2013-05-01 ENCOUNTER — Inpatient Hospital Stay (HOSPITAL_COMMUNITY)
Admission: AD | Admit: 2013-05-01 | Discharge: 2013-05-04 | DRG: 885 | Disposition: A | Discharge status: Home / Self Care | Payer: Medicaid Other | Source: Intra-hospital | Attending: Psychiatry | Admitting: Psychiatry

## 2013-05-01 ENCOUNTER — Encounter (HOSPITAL_COMMUNITY): Payer: Self-pay | Admitting: Registered Nurse

## 2013-05-01 ENCOUNTER — Encounter (HOSPITAL_COMMUNITY): Payer: Self-pay

## 2013-05-01 DIAGNOSIS — F333 Major depressive disorder, recurrent, severe with psychotic symptoms: Principal | ICD-10-CM | POA: Diagnosis present

## 2013-05-01 DIAGNOSIS — J4489 Other specified chronic obstructive pulmonary disease: Secondary | ICD-10-CM | POA: Diagnosis present

## 2013-05-01 DIAGNOSIS — R6889 Other general symptoms and signs: Secondary | ICD-10-CM

## 2013-05-01 DIAGNOSIS — F17200 Nicotine dependence, unspecified, uncomplicated: Secondary | ICD-10-CM | POA: Diagnosis present

## 2013-05-01 DIAGNOSIS — G4730 Sleep apnea, unspecified: Secondary | ICD-10-CM

## 2013-05-01 DIAGNOSIS — F329 Major depressive disorder, single episode, unspecified: Secondary | ICD-10-CM | POA: Diagnosis present

## 2013-05-01 DIAGNOSIS — F1010 Alcohol abuse, uncomplicated: Secondary | ICD-10-CM | POA: Diagnosis present

## 2013-05-01 DIAGNOSIS — E162 Hypoglycemia, unspecified: Secondary | ICD-10-CM

## 2013-05-01 DIAGNOSIS — R0989 Other specified symptoms and signs involving the circulatory and respiratory systems: Secondary | ICD-10-CM

## 2013-05-01 DIAGNOSIS — J449 Chronic obstructive pulmonary disease, unspecified: Secondary | ICD-10-CM

## 2013-05-01 DIAGNOSIS — R911 Solitary pulmonary nodule: Secondary | ICD-10-CM

## 2013-05-01 DIAGNOSIS — F3289 Other specified depressive episodes: Secondary | ICD-10-CM

## 2013-05-01 DIAGNOSIS — I2699 Other pulmonary embolism without acute cor pulmonale: Secondary | ICD-10-CM

## 2013-05-01 DIAGNOSIS — R0602 Shortness of breath: Secondary | ICD-10-CM

## 2013-05-01 DIAGNOSIS — E785 Hyperlipidemia, unspecified: Secondary | ICD-10-CM

## 2013-05-01 DIAGNOSIS — IMO0001 Reserved for inherently not codable concepts without codable children: Secondary | ICD-10-CM

## 2013-05-01 DIAGNOSIS — E559 Vitamin D deficiency, unspecified: Secondary | ICD-10-CM

## 2013-05-01 DIAGNOSIS — J301 Allergic rhinitis due to pollen: Secondary | ICD-10-CM

## 2013-05-01 DIAGNOSIS — F419 Anxiety disorder, unspecified: Secondary | ICD-10-CM

## 2013-05-01 DIAGNOSIS — Z5181 Encounter for therapeutic drug level monitoring: Secondary | ICD-10-CM

## 2013-05-01 DIAGNOSIS — R45851 Suicidal ideations: Secondary | ICD-10-CM

## 2013-05-01 DIAGNOSIS — Z720 Tobacco use: Secondary | ICD-10-CM

## 2013-05-01 DIAGNOSIS — Z86711 Personal history of pulmonary embolism: Secondary | ICD-10-CM

## 2013-05-01 DIAGNOSIS — I959 Hypotension, unspecified: Secondary | ICD-10-CM

## 2013-05-01 DIAGNOSIS — F332 Major depressive disorder, recurrent severe without psychotic features: Secondary | ICD-10-CM

## 2013-05-01 DIAGNOSIS — G4700 Insomnia, unspecified: Secondary | ICD-10-CM

## 2013-05-01 DIAGNOSIS — Z79899 Other long term (current) drug therapy: Secondary | ICD-10-CM

## 2013-05-01 DIAGNOSIS — F411 Generalized anxiety disorder: Secondary | ICD-10-CM | POA: Diagnosis present

## 2013-05-01 HISTORY — DX: Chronic obstructive pulmonary disease, unspecified: J44.9

## 2013-05-01 LAB — PROTIME-INR
INR: 1.12 (ref 0.00–1.49)
Prothrombin Time: 14.2 s (ref 11.6–15.2)

## 2013-05-01 MED ORDER — WARFARIN SODIUM 5 MG PO TABS
5.0000 mg | ORAL_TABLET | Freq: Once | ORAL | Status: DC
  Administered 2013-05-01: 5 mg via ORAL
  Filled 2013-05-01: qty 1

## 2013-05-01 MED ORDER — ALBUTEROL SULFATE HFA 108 (90 BASE) MCG/ACT IN AERS
2.0000 | INHALATION_SPRAY | Freq: Four times a day (QID) | RESPIRATORY_TRACT | Status: DC | PRN
  Filled 2013-05-01: qty 6.7

## 2013-05-01 MED ORDER — POTASSIUM CHLORIDE CRYS ER 20 MEQ PO TBCR: 20.0000 meq | EXTENDED_RELEASE_TABLET | Freq: Once | ORAL | Status: DC

## 2013-05-01 MED ORDER — WARFARIN SODIUM 5 MG PO TABS
5.0000 mg | ORAL_TABLET | Freq: Once | ORAL | Status: AC
  Administered 2013-05-02: 5 mg via ORAL
  Filled 2013-05-01 (×2): qty 1

## 2013-05-01 MED ORDER — POTASSIUM CHLORIDE CRYS ER 20 MEQ PO TBCR
20.0000 meq | EXTENDED_RELEASE_TABLET | Freq: Once | ORAL | Status: AC
  Administered 2013-05-01: 20 meq via ORAL
  Filled 2013-05-01 (×2): qty 1

## 2013-05-01 MED ORDER — ALUM & MAG HYDROXIDE-SIMETH 200-200-20 MG/5ML PO SUSP
30.0000 mL | ORAL | Status: DC | PRN
  Administered 2013-05-03: 30 mL via ORAL

## 2013-05-01 MED ORDER — TRAZODONE HCL 50 MG PO TABS
50.0000 mg | ORAL_TABLET | Freq: Every evening | ORAL | Status: DC | PRN
  Administered 2013-05-01: 50 mg via ORAL
  Filled 2013-05-01: qty 1

## 2013-05-01 MED ORDER — WARFARIN - PHARMACIST DOSING INPATIENT
Freq: Every day | Status: DC
  Administered 2013-05-02 – 2013-05-03 (×2)
  Filled 2013-05-01 (×15): qty 1

## 2013-05-01 MED ORDER — MAGNESIUM HYDROXIDE 400 MG/5ML PO SUSP: 30.0000 mL | Freq: Every day | ORAL | Status: DC | PRN

## 2013-05-01 MED ORDER — ACETAMINOPHEN 325 MG PO TABS
650.0000 mg | ORAL_TABLET | Freq: Four times a day (QID) | ORAL | Status: DC | PRN
  Administered 2013-05-02: 650 mg via ORAL

## 2013-05-01 NOTE — BH Assessment (Addendum)
Assessment Note  Emily Cameron is a 52 y.o. female who presents to [facility] with SI/Depression.  Pt reports worsening depressive symptoms x2 mos and says she been hearing voices with command to harm self for 3 mos.  Pt tells this writer she's been off her psych medications for approx 7-8 mos and states no specific reason(s) for non compliance with medications. Pt has plan to overdose on medications and admits that she is starving herself(pt has lost 50 pds in the last several mos). Pt has tried to harm self 3x's in the past by overdosing on meds and jumping in front of a vehicle.   Pt admits SA--uses 2 "nickle bags" 4x's a month.  Pt has 3 past inpt psych admissions in 1980's, pt unable to verify names of hospitals.  Pt has no current outpt services with therapist/psych.  Pt has no legal issues. Disposition pending AM psych eval.   Axis I: MDD, recurrent, severe w/psych; Cannabis Abuse  Axis II: Deferred Axis III:  Past Medical History  Diagnosis Date  . Anxiety   . Depression   . Insomnia   . Weight loss     due to anxiety  . PE (pulmonary embolism) 2006  . Alcohol abuse   . Polysubstance abuse     Current smoking and alcohol. 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 Marijuna not taking since 7 month.    Axis IV: other psychosocial or environmental problems, problems related to social environment and problems with primary support group Axis V: 21-30 behavior considerably influenced by delusions or hallucinations OR serious impairment in judgment, communication OR inability to function in almost all areas  Past Medical History:  Past Medical History  Diagnosis Date  . Anxiety   . Depression   . Insomnia   . Weight loss     due to anxiety  . PE (pulmonary embolism) 2006  . Alcohol abuse   . Polysubstance abuse     Current smoking and alcohol. 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 Marijuna not taking since 7 month.     Past Surgical History  Procedure  Laterality Date  . Abdominal hysterectomy      Family History:  Family History  Problem Relation Age of Onset  . Coronary artery disease Sister   . Heart disease Mother   . Hyperlipidemia Mother   . Hypertension Mother   . Aneurysm Brother 43  . Aneurysm Paternal Aunt 76    Died   . Heart disease Maternal Grandmother   . Pulmonary embolism Brother     Social History:  reports that she has been smoking Cigarettes.  She has a 15.5 pack-year smoking history. She has never used smokeless tobacco. She reports that  drinks alcohol. She reports that she uses illicit drugs (Marijuana).  Additional Social History:  Alcohol / Drug Use Pain Medications: See MAR  Prescriptions: See MAR  Over the Counter: See MAR  History of alcohol / drug use?: Yes Longest period of sobriety (when/how long): None  Substance #1 Name of Substance 1: THC  1 - Age of First Use: Teens  1 - Amount (size/oz): 2 "nickle bags" 1 - Frequency: 4x's  1 - Duration: Monthly  1 - Last Use / Amount: Unk   CIWA: CIWA-Ar BP: 97/58 mmHg Pulse Rate: 71 COWS:    Allergies: No Known Allergies  Home Medications:  (Not in a hospital admission)  OB/GYN Status:  Patient's last menstrual period was 01/26/2004.  General Assessment Data [facility] of [facility]: [facility] Is this a Tele or Face-to-Face Assessment?: Tele Assessment Is this an Initial Assessment or a Re-assessment for this encounter?: Initial Assessment Living Arrangements: Other (Comment) (Lives with family ) Can pt return to current living arrangement?: Yes Admission Status: Voluntary Is patient capable of signing voluntary admission?: Yes Transfer from: Acute Hospital Referral Source: MD  Medical Screening Exam [facility]) Medical Exam completed: No Reason for MSE not completed: Other: (None )  [facility] Crisis Care Plan Living Arrangements: Other (Comment) (Lives with family ) Name of Psychiatrist: None  Name of Therapist: None   Education  Status Is patient currently in school?: No Current Grade: None  Highest grade of school patient has completed: None  Name of school: None  Contact person: None   Risk to self Suicidal Ideation: Yes-Currently Present Suicidal Intent: Yes-Currently Present Is patient at risk for suicide?: Yes Suicidal Plan?: Yes-Currently Present Specify Current Suicidal Plan: Overdose and starving self Access to Means: Yes Specify Access to Suicidal Means: Medications, Starving self  What has been your use of drugs/alcohol within the last 12 months?: Abusing THC  Previous Attempts/Gestures: No How many times?: 3 Other Self Harm Risks: None  Triggers for Past Attempts: Unpredictable Intentional Self Injurious Behavior: None Family Suicide History: No Recent stressful life event(s): Other (Comment) (Worsening depression) Persecutory voices/beliefs?: No Depression: Yes Depression Symptoms: Loss of interest in usual pleasures;Feeling worthless/self pity;Tearfulness;Despondent Substance abuse history and/or treatment for substance abuse?: Yes Suicide prevention information given to non-admitted patients: Not applicable  Risk to Others Homicidal Ideation: No Thoughts of Harm to Others: No Current Homicidal Intent: No Current Homicidal Plan: No Access to Homicidal Means: No Identified Victim: None  History of harm to others?: No Assessment of Violence: None Noted Violent Behavior Description: None  Does patient have access to weapons?: No Criminal Charges Pending?: No Does patient have a court date: No  Psychosis Hallucinations: Auditory;With command Delusions: None noted  Mental Status Report Appear/Hygiene: Disheveled Eye Contact: Fair Motor Activity: Unremarkable Speech: Logical/coherent Level of Consciousness: Alert Mood: Depressed;Sad Affect: Depressed;Sad Anxiety Level: None Thought Processes: Coherent;Relevant Judgement: Impaired Orientation:  Person;Place;Time;Situation Obsessive Compulsive Thoughts/Behaviors: None  Cognitive Functioning Concentration: Normal Memory: Recent Intact;Remote Intact IQ: Average Insight: Poor Impulse Control: Poor Appetite: Poor Weight Loss: 50 Weight Gain: 0 Sleep: Decreased Total Hours of Sleep: 4 Vegetative Symptoms: None  ADLScreening [facility] Assessment Services) Patient's cognitive ability adequate to safely complete daily activities?: Yes Patient able to express need for assistance with ADLs?: Yes Independently performs ADLs?: Yes (appropriate for developmental age)  Prior Inpatient Therapy Prior Inpatient Therapy: Yes Prior Therapy Dates: 1980's  Prior Therapy Facilty/Provider(s): Unk  Reason for Treatment: Depression/SI   Prior Outpatient Therapy Prior Outpatient Therapy: No Prior Therapy Dates: None  Prior Therapy Facilty/Provider(s): None  Reason for Treatment: None   ADL Screening (condition at time of admission) Patient's cognitive ability adequate to safely complete daily activities?: Yes Is the patient deaf or have difficulty hearing?: No Does the patient have difficulty seeing, even when wearing glasses/contacts?: No Does the patient have difficulty concentrating, remembering, or making decisions?: No Patient able to express need for assistance with ADLs?: Yes Does the patient have difficulty dressing or bathing?: No Independently performs ADLs?: Yes (appropriate for developmental age) Does the patient have difficulty walking or climbing stairs?: No Weakness of Legs: None Weakness of Arms/Hands: None  Home Assistive Devices/Equipment Home Assistive Devices/Equipment: None  Therapy Consults (therapy consults require a physician order) PT Evaluation  Needed: No OT Evalulation Needed: No SLP Evaluation Needed: No Abuse/Neglect Assessment (Assessment to be complete while patient is alone) Physical Abuse: Denies Verbal Abuse: Denies Sexual Abuse: Denies Exploitation  of patient/patient's resources: Denies Self-Neglect: Denies Values / Beliefs Cultural Requests During Hospitalization: None Spiritual Requests During Hospitalization: None Consults Spiritual Care Consult Needed: No Social Work Consult Needed: No Merchant navy officer (For Healthcare) Advance Directive: Patient does not have advance directive;Patient would not like information Pre-existing out of facility DNR order (yellow form or pink MOST form): No Nutrition Screen- [facility] Patient's home diet: Regular  Additional Information 1:1 In Past 12 Months?: No CIRT Risk: No Elopement Risk: No Does patient have medical clearance?: Yes     Disposition:  Disposition Initial Assessment Completed for this Encounter: Yes Disposition of Patient: Inpatient treatment program;Referred to [facility] ) Type of inpatient treatment program: Adult Patient referred to: Other (Comment) [facility] )  On Site Evaluation by:   Reviewed with Physician:    Murrell Redden 05/01/2013 3:47 AM

## 2013-05-01 NOTE — Consult Note (Signed)
  Evaluation for inpatient treatment; Face to face interview and consulted with Dr.  Referred by:  EDP   HPI:  Patient states that she has some depression that is worsening since her daughter has moved back to [place].  Patient states that she is also hearing voices that are telling he that she is worthless; that she doesn't deserve to be here, and that she is ugly.  Patient states that she has a history of suicide attempt in the past by overdose on medication.  Patient states that she also has some paranoia.  "I don't trust people.  Patient states that she was on medication for depression but has not been on medication for 6-8 months; which is when she moved to [place]. Patient states that she saw doctor at [facility].  Patient denies homicidal ideation,    @ Axis I: Major Depression, Recurrent severe Axis II: Deferred Axis III:  Past Medical History  Diagnosis Date  . Anxiety   . Depression   . Insomnia   . Weight loss     due to anxiety  . PE (pulmonary embolism) 2006  . Alcohol abuse   . Polysubstance abuse     Current smoking and alcohol. 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 Marijuna not taking since 7 month.    Axis IV: other psychosocial or environmental problems, problems related to social environment and problems with primary support group Axis V: 41-50 serious symptoms  @Psychiatric  Specialty Exam: @PHYSEXAMBYAGE2 @  @ROS @  Blood pressure 105/65, pulse 96, temperature 98.5 F (36.9 C), temperature source Oral, resp. rate 22, last menstrual period 01/26/2004, SpO2 95.00%.There is no weight on file to calculate BMI.  General Appearance: Disheveled  Eye Contact::  Good  Speech:  Clear and Coherent and Normal Rate  Volume:  Decreased  Mood:  Anxious, Depressed, Hopeless and Worthless  Affect:  Depressed and Flat  Thought Process:  Circumstantial  Orientation:  Full (Time, Place, and Person)  Thought Content:   Hallucinations: Olfactory and Rumination  Suicidal Thoughts:  Yes.  with intent/plan  Homicidal Thoughts:  No  Memory:  Immediate;   Fair Recent;   Fair Remote;   Fair  Judgement:  Impaired  Insight:  Fair  Psychomotor Activity:  Normal  Concentration:  Fair  Recall:  Fair  Akathisia:  No  Handed:  Right  AIMS (if indicated):     Assets:  Communication Skills Desire for Improvement  Sleep:      Plan:  Disposition:  Inpatient treatment.  Patient accepted to [facility] 500 hall pending bed availability. If no beds available seek placement elsewhere.   1. Admit for crisis management and stabilization.  2. Review and initiate  medications pertinent to patient illness and treatment.  3. Medication management to reduce current symptoms to base line and improve the         patient's overall level of functioning.

## 2013-05-01 NOTE — Consult Note (Signed)
Pt. Discussed, note reviewed and agreed with 

## 2013-05-01 NOTE — Progress Notes (Signed)
ANTICOAGULATION CONSULT NOTE - Follow up Consult  Pharmacy Consult for Coumadin Indication: h/o PEs  No Known Allergies  Patient Measurements:    Vital Signs: Temp: 98.5 F (36.9 C) (09/09 1150) Temp src: Oral (09/09 1150) BP: 105/65 mmHg (09/09 1150) Pulse Rate: 96 (09/09 1150)  Labs:  Recent Labs  04/30/13 1706 04/30/13 1830 05/01/13 0535  HGB  --  15.5*  --   HCT  --  43.4  --   PLT  --  200  --   LABPROT 13.0  --  14.2  INR 1.00  --  1.12  CREATININE  --  0.66  --     The CrCl is unknown because both a height and weight (above a minimum accepted value) are required for this calculation.  Assessment: 18 yoF with h/o PEs supposed on be chronic Coumadin but has not been taking Coumadin for several months now given financial difficulties. Last anti-coag visit documented in [facility] was 06/26/2012 at which time patient was on Coumadin 5mg  daily except for 7.5mg  Mon/Thur and INR was 4.10 although INR has been therapeutic on this same dose and even higher previously. Unable to confirm actual dose with patient but per MD note, pt thinks she was on Coumadin 5mg  po daily.  INR increased slightly after 7.5mg  x 1.   CBC ok. No bleeding reported/documented.  Regular diet.  Goal of Therapy:  INR 2-3 Monitor platelets by anticoagulation protocol: Yes  Plan:   Coumadin 5mg  today.   Daily PT/INR  Charolotte Eke, PharmD, pager [phone]. 05/01/2013,2:39 PM.

## 2013-05-01 NOTE — ED Notes (Signed)
Gave report to [place] at [facility]

## 2013-05-01 NOTE — BHH Counselor (Signed)
Writer consulted with NP, Shuvon regarding the patient meeting criteria for inpatient hospitalization.  Writer informed the nurse.  Writer completed the support paperwork for the patient.  The patient has been accepted to Bed 504-1.  Writer informed the nurse working with the patient that the patient cannot go to [facility] unit the [facility] has approved an appropriate time for the patient to arrive.   Writer faxed the support paperwork to [facility].  Writer contacted [facility] and informed them that all supporting paperwork has been completed.

## 2013-05-02 DIAGNOSIS — F333 Major depressive disorder, recurrent, severe with psychotic symptoms: Secondary | ICD-10-CM

## 2013-05-02 LAB — PROTIME-INR: Prothrombin Time: 17.2 seconds — ABNORMAL HIGH (ref 11.6–15.2)

## 2013-05-02 MED ORDER — ARIPIPRAZOLE 5 MG PO TABS
5.0000 mg | ORAL_TABLET | Freq: Every day | ORAL | Status: DC
  Administered 2013-05-02: 5 mg via ORAL
  Filled 2013-05-02 (×4): qty 1

## 2013-05-02 MED ORDER — NICOTINE 14 MG/24HR TD PT24
14.0000 mg | MEDICATED_PATCH | Freq: Every day | TRANSDERMAL | Status: DC
  Administered 2013-05-03 – 2013-05-04 (×2): 14 mg via TRANSDERMAL
  Filled 2013-05-02 (×4): qty 1

## 2013-05-02 MED ORDER — WARFARIN SODIUM 5 MG PO TABS
5.0000 mg | ORAL_TABLET | Freq: Once | ORAL | Status: DC
  Filled 2013-05-02: qty 1

## 2013-05-02 MED ORDER — MIRTAZAPINE 15 MG PO TBDP
7.5000 mg | ORAL_TABLET | Freq: Every day | ORAL | Status: DC
  Administered 2013-05-02: 7.5 mg via ORAL
  Filled 2013-05-02: qty 1
  Filled 2013-05-02 (×2): qty 0.5

## 2013-05-02 MED ORDER — BOOST / RESOURCE BREEZE PO LIQD
1.0000 | Freq: Three times a day (TID) | ORAL | Status: DC
  Filled 2013-05-02 (×11): qty 1

## 2013-05-02 MED ORDER — ENSURE PUDDING PO PUDG
1.0000 | Freq: Three times a day (TID) | ORAL | Status: DC
  Filled 2013-05-02 (×11): qty 1

## 2013-05-02 MED ORDER — ADULT MULTIVITAMIN W/MINERALS CH
1.0000 | ORAL_TABLET | Freq: Every day | ORAL | Status: DC
  Administered 2013-05-02 – 2013-05-04 (×3): 1 via ORAL
  Filled 2013-05-02 (×5): qty 1

## 2013-05-02 NOTE — Tx Team (Signed)
Interdisciplinary Treatment Plan Update   Date Reviewed:  05/02/2013  Time Reviewed:  9:44 AM  Progress in Treatment:   Attending groups: Yes Participating in groups: Yes Taking medication as prescribed: Yes  Tolerating medication: Yes Family/Significant other contact made: No, but will ask patient for consent for collateral contact Patient understands diagnosis: Yes  Discussing patient identified problems/goals with staff: Yes Medical problems stabilized or resolved: Yes Denies suicidal/homicidal ideation: Yes Patient has not harmed self or others: Yes  For review of initial/current patient goals, please see plan of care.  Estimated Length of Stay:  3-5 days  Reasons for Continued Hospitalization:  Anxiety Depression Medication stabilization Suicidal ideation  New Problems/Goals identified:    Discharge Plan or Barriers:   Home with outpatient follow up to be determined  Additional Comments:  Emily Cameron is a 52 y.o. female who presents to [facility] with SI/Depression. Pt reports worsening depressive symptoms x2 mos and says she been hearing voices with command to harm self for 3 mos. Pt tells this writer she's been off her psych medications for approx 7-8 mos and states no specific reason(s) for non compliance with medications. Pt has plan to overdose on medications and admits that she is starving herself(pt has lost 50 pds in the last several mos). Pt has tried to harm self 3x's in the past by overdosing on meds and jumping in front of a vehicle.    Attendees:  Patient:  05/02/2013 9:44 AM   Signature: Mervyn Gay, MD 05/02/2013 9:44 AM  Signature:  Verne Spurr, PA 05/02/2013 9:44 AM  Signature: Harold Barban, RN 05/02/2013 9:44 AM  Signature 05/02/2013 9:44 AM  Signature:   05/02/2013 9:44 AM  Signature:  Juline Patch, LCSW 05/02/2013 9:44 AM  Signature:  Reyes Ivan, LCSW 05/02/2013 9:44 AM  Signature:  Sharin Grave Coordinator 05/02/2013 9:44 AM  Signature:    05/02/2013 9:44 AM  Signature: Leighton Parody, RN 05/02/2013  9:44 AM  Signature:    Signature:      Scribe for Treatment Team:   Juline Patch,  05/02/2013 9:44 AM

## 2013-05-02 NOTE — Progress Notes (Signed)
Adult Psychoeducational Group Note  Date:  05/02/2013 Time:  1:10 PM  Group Topic/Focus:  Personal Choices and Values:   The focus of this group is to help patients assess and explore the importance of values in their lives, how their values affect their decisions, how they express their values and what opposes their expression.  Participation Level:  Active  Participation Quality:  Appropriate  Affect:  Appropriate  Cognitive:  Alert and Appropriate  Insight: Appropriate and Good  Engagement in Group:  Engaged  Modes of Intervention:  Discussion, Education and Support  Additional Comments:  Pt was an active participant in group and described "being a good parent" as a key value she wants to focus on." She stated she wishes she had money to give them gifts.   Reynolds Bowl 05/02/2013, 1:10 PM

## 2013-05-02 NOTE — Progress Notes (Signed)
D: Patient denies /HI and visual hallucinations; reports on and off thoughts of SI and some auditory hallucination of hearing derogatory statements and people laughing and that was reported to the practitioner and denied to this writer; patient reports sleep is poor because of a headache; reports appetite is poor ; reports energy level is low ; reports ability to pay attention is good; rates depression as 6/10; rates hopelessness 8/10; rates anxiety as 9/10;   A: Monitored q 15 minutes; patient encouraged to attend groups; patient educated about medications; patient given medications per physician orders; patient encouraged to express feelings and/or concerns  R: Patient is anxious and depressed; patient is cooperative and pleasant; patient's interaction with staff and peers is appropriate; patient was able to set goal to talk with staff 1:1 when having feelings of SI; patient is taking medications as prescribed and tolerating medications; patient is attending all groups and engaging
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
## 2013-05-02 NOTE — Progress Notes (Signed)
Recreation Therapy Notes  Date: 09.10.2014 Time: 3:00pm Location: 500 Hall Dayroom  Group Topic: Self Expression  Goal Area(s) Addresses:  Patient will will use art as a means of self-expression. Patient will identify effectively identify emotions experienced during activity.   Behavioral Response: Appropriate  Intervention: Art  Activity: Patients were asked to draw a bottle that would represent their lives, inside the bottle patients were asked to draw or write words to represent how they currently feel about life. Group discussion focused around using currently feelings to effectuate change in their lives.   Education: Pharmacologist, Discharge Planning, Emotional Exploration   Education Outcome: Acknowledges understanding   Clinical Observations/Feedback:  Patient arrived to group late, upon arrival she engaged in activity. Patient stated she did not understand how to complete the activity. LRT offered her various examples and described the activity in different descriptors, however patient still struggled with the abstract components of activity. Due to difficulty patient did not complete drawing. Patient remained in group session and listened attentively as group members shared.   Marykay Lex Chia Mowers, LRT/CTRS  Jearl Klinefelter 05/02/2013 9:13 PM

## 2013-05-02 NOTE — [facility] Group Notes (Addendum)
[facility] LCSW Group Therapy  Emotional Regulation 1:15 - 2: 30 PM        05/02/2013  2:53 PM   Type of Therapy:  Group Therapy  Participation Level:  Appropriate  Participation Quality:  Appropriate  Affect:  Appropriate  Cognitive:  Attentive Appropriate  Insight:  Engaged  Engagement in Therapy:  Engaged  Modes of Intervention:  Discussion Exploration Problem-Solving Supportive  Summary of Progress/Problems:  Group topic was emotional regulations.  Patient participated in the discussion and was able to identify an emotion that needed to regulated.  Patient shared she deals with a lot of fears and panic.  She was able to identify approprite coping skills.  Wynn Banker 05/02/2013 2:53 PM

## 2013-05-02 NOTE — Progress Notes (Signed)
The focus of this group is to help patients review their daily goal of treatment and discuss progress on daily workbooks. Pt attended the evening group session and responded to all discussion prompts from the Writer. Pt reported having a good day on the unit, largely due to the fellowship of her peers. Pt also reported enjoying going outside for fresh air. She shared that her goal was to "get myself right so that I can get back to my children, my great grandchildren and my great grandchild." Pt's affect was bright and the Writer observed her smiling and interacting with her peers.

## 2013-05-02 NOTE — [facility] Group Notes (Signed)
[facility] LCSW Aftercare Discharge Planning Group Note   05/02/2013 9:46 AM  Participation Quality:  Did not attend group.  Emily Cameron, Emily Cameron

## 2013-05-02 NOTE — Progress Notes (Signed)
Admission Note:  D:51 yr female who presents VC in no acute distress for the treatment of SI and Depression. Pt appears flat and depressed. Pt was calm and cooperative with admission process. Pt presents with passive SI and contracts for safety upon admission. Pt denies HI/VH . Pt passive AH some command. Pt states she "felt bad, worthless, no body wants me, I didn't want to live". Pt plan was to walk out into traffic. Pt on Coumadin for "blood clots in Lungs"   A: 15 minute checks done for safety, Pt admission finished. R:Pt had no additional questions or concerns.
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
## 2013-05-02 NOTE — Progress Notes (Signed)
ANTICOAGULATION CONSULT NOTE - Initial Consult  Pharmacy Consult for coumadin Indication: h/o pulmonary embolus  No Known Allergies  Patient Measurements:     Vital Signs: Temp: 97.5 F (36.4 C) (09/10 0730) Temp src: Oral (09/10 0730) BP: 100/71 mmHg (09/10 0731) Pulse Rate: 74 (09/10 0731)  Labs:  Recent Labs  04/30/13 1706 04/30/13 1830 05/01/13 0535 05/02/13 0610  HGB  --  15.5*  --   --   HCT  --  43.4  --   --   PLT  --  200  --   --   LABPROT 13.0  --  14.2 17.2*  INR 1.00  --  1.12 1.44  CREATININE  --  0.66  --   --     The CrCl is unknown because both a height and weight (above a minimum accepted value) are required for this calculation.   Medical History: Past Medical History  Diagnosis Date  . Anxiety   . Depression   . Insomnia   . Weight loss     due to anxiety  . PE (pulmonary embolism) 2006  . Alcohol abuse   . Polysubstance abuse     Current smoking and alcohol. 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 Marijuna not taking since 7 month.   Marland Kitchen COPD (chronic obstructive pulmonary disease)     Medications:  Scheduled:  . warfarin  5 mg Oral ONCE-1800  . Warfarin - Pharmacist Dosing Inpatient   Does not apply q1800    Assessment:  Patient has been non compliant with coumadin.  Has moved to [place] and then back to [place]. Patient has not been able to access care and medications.  Patient has MD here in [place] and willing to follow up with coumadin clinic in town.    Goal of Therapy:  INR 2-3    Plan:  Coumadin 5 mg po x 1 today at 1800 Draw PT/INR in am  Spoke with patient regarding coumadin and understands that follow up must occur and willinmg to be compliant with meds as she is returning to [place]. F/U am   Charyl Dancer 05/02/2013,3:35 PM

## 2013-05-02 NOTE — Progress Notes (Signed)
Adult Psychoeducational Group Note  Date:  05/02/2013 Time: 10:00am Group Topic/Focus:  Therapeutic Activity  Participation Level:  Active  Participation Quality:  Appropriate and Attentive  Affect:  Appropriate  Cognitive:  Alert and Appropriate  Insight: Appropriate  Engagement in Group:  Engaged  Modes of Intervention:  Discussion and Education  Additional Comments:  Pt attended and participated in group. Therapeutic Newman [employer] was used and the question was ask What would you do if you had a million dollars? Pt stated she would share it with the world.  Shelly Bombard D 05/02/2013, 11:52 AM

## 2013-05-02 NOTE — [facility] Suicide Risk Assessment (Signed)
Suicide Risk Assessment  Admission Assessment     Nursing information obtained from:  Patient Demographic factors:  Unemployed;Low socioeconomic status Current Mental Status:  Self-harm thoughts;Suicidal ideation indicated by patient Loss Factors:  Decline in physical health;Financial problems / change in socioeconomic status Historical Factors:  Prior suicide attempts Risk Reduction Factors:  Positive social support;Living with another person, especially a relative  CLINICAL FACTORS:   Depression:   Anhedonia Hopelessness Impulsivity Insomnia Recent sense of peace/wellbeing Severe Unstable or Poor Therapeutic Relationship Previous Psychiatric Diagnoses and Treatments Medical Diagnoses and Treatments/Surgeries  COGNITIVE FEATURES THAT CONTRIBUTE TO RISK:  Closed-mindedness Loss of executive function Polarized thinking Thought constriction (tunnel vision)    SUICIDE RISK:   Moderate:  Frequent suicidal ideation with limited intensity, and duration, some specificity in terms of plans, no associated intent, good self-control, limited dysphoria/symptomatology, some risk factors present, and identifiable protective factors, including available and accessible social support.  PLAN OF CARE: Patient admitted voluntarily, emergently from [facility] for depression and passive suicidal ideation without plan.   I certify that inpatient services furnished can reasonably be expected to improve the patient's condition.   Nehemiah Settle., MD 05/02/2013, 3:35 PM

## 2013-05-02 NOTE — Progress Notes (Addendum)
NUTRITION ASSESSMENT  Pt identified as at risk on the Malnutrition Screen Tool  INTERVENTION: 1. Educated patient on the importance of nutrition and encouraged intake of food and beverages. 2. Discussed weight goals. 3. Supplements: MVI daily and Resource Breeze tid  NUTRITION DIAGNOSIS: Unintentional weight loss related to sub-optimal intake as evidenced by pt report.   Goal: Pt to meet >/= 90% of their estimated nutrition needs.  Monitor:  PO intake  Assessment:  Patient admitted with SI and depression.  Moved from [facility] 6 weeks ago.  Hx includes a PE and is currently on coumadin.   Patient reports that UBW of 150 labs 6 months ago and has not been eating well secondary to stress and moving.  Drank Ensure Plus (up to 6 daily) and not much food. "Never a big eater."  Current intake is poor currently and complains of a poor appetite.  Does not like Ensure Complete but willing to try Resource. Patient is on Coumadin and aware of diet guidelines.  Reviewed with patient briefly.   52 y.o. female  Height: Ht Readings from Last 1 Encounters:  05/23/12 5\' 4"  (1.626 m)    Weight: Wt Readings from Last 1 Encounters:  05/23/12 140 lb 3.2 oz (63.594 kg)    Weight Hx: Wt Readings from Last 10 Encounters:  05/23/12 140 lb 3.2 oz (63.594 kg)  05/08/12 144 lb (65.318 kg)  05/03/12 148 lb 1.6 oz (67.178 kg)  03/31/12 144 lb 6.4 oz (65.499 kg)  02/21/12 147 lb (66.679 kg)  02/18/12 149 lb 8 oz (67.813 kg)  10/18/11 149 lb (67.586 kg)  09/23/11 140 lb (63.504 kg)  09/06/11 139 lb 3.2 oz (63.141 kg)  06/08/11 136 lb 14.4 oz (62.097 kg)    BMI:  24 Pt meets criteria for wnl based on current BMI.  Estimated Nutritional Needs: Kcal: 25-30 kcal/kg Protein: > 1 gram protein/kg Fluid: 1 ml/kcal  Diet Order: General Pt is also offered choice of unit snacks mid-morning and mid-afternoon.  Pt is eating as desired.   Lab results and medications reviewed.   Oran Rein, RD, LDN Clinical  Inpatient Dietitian Pager:  [phone] Weekend and after hours pager:  [phone]

## 2013-05-03 DIAGNOSIS — F332 Major depressive disorder, recurrent severe without psychotic features: Secondary | ICD-10-CM

## 2013-05-03 DIAGNOSIS — F3130 Bipolar disorder, current episode depressed, mild or moderate severity, unspecified: Secondary | ICD-10-CM

## 2013-05-03 LAB — PROTIME-INR
INR: 1.47 (ref 0.00–1.49)
Prothrombin Time: 17.4 seconds — ABNORMAL HIGH (ref 11.6–15.2)

## 2013-05-03 MED ORDER — ARIPIPRAZOLE 10 MG PO TABS
5.0000 mg | ORAL_TABLET | Freq: Two times a day (BID) | ORAL | Status: DC
  Administered 2013-05-03 – 2013-05-04 (×2): 5 mg via ORAL
  Filled 2013-05-03 (×2): qty 1
  Filled 2013-05-03: qty 14
  Filled 2013-05-03 (×2): qty 1
  Filled 2013-05-03: qty 14

## 2013-05-03 MED ORDER — WARFARIN SODIUM 6 MG PO TABS
6.0000 mg | ORAL_TABLET | Freq: Once | ORAL | Status: AC
  Administered 2013-05-03: 6 mg via ORAL
  Filled 2013-05-03: qty 6
  Filled 2013-05-03: qty 1

## 2013-05-03 MED ORDER — MIRTAZAPINE 15 MG PO TBDP
15.0000 mg | ORAL_TABLET | Freq: Every day | ORAL | Status: DC
  Administered 2013-05-03: 15 mg via ORAL
  Filled 2013-05-03 (×2): qty 1
  Filled 2013-05-03: qty 14

## 2013-05-03 NOTE — [facility] Group Notes (Signed)
[facility] LCSW Group Therapy      Living a Balanced Life  1:15-2:30              05/03/2013  3:01 PM    Type of Therapy:  Group Therapy  Participation Level:  Appropriate  Participation Quality:  Appropriate  Affect:  Appropriate  Cognitive:  Attentive Appropriate  Insight:  Engaged  Engagement in Therapy:  Engaged  Modes of Intervention:  Discussion Exploration Problem-Solving Supportive  Summary of Progress/Problems: Patient shared her life is out of balance because she spends more time trying to take care of her adult children than taking care of herself.  She shared she plans to return to [place] and focus more on her needs.   Wynn Banker 05/03/2013  3:01 PM

## 2013-05-03 NOTE — Progress Notes (Signed)
Patient has been alert and attended groups today.  Patient has been cooperative and pleasant. \

## 2013-05-03 NOTE — Progress Notes (Signed)
Adult Psychoeducational Group Note  Date:  05/03/2013 Time:  2:10 PM  Group Topic/Focus:  Overcoming Stress:   The focus of this group is to define stress and help patients assess their triggers.  Participation Level:  Active  Participation Quality:  Appropriate  Affect:  Angry  Cognitive:  Alert and Appropriate  Insight: Appropriate  Engagement in Group:  Engaged  Modes of Intervention:  Discussion, Education and Support  Additional Comments:  Pt actively participated in group by completing "Stress Interview" worksheet. Pt identified taking a walk as a positive coping skill for dealing with stress.  Reinaldo Raddle K 05/03/2013, 2:10 PM

## 2013-05-03 NOTE — Progress Notes (Signed)
ANTICOAGULATION CONSULT NOTE - Follow Up Consult  Pharmacy Consult for  Coumadin Indication: pulmonary embolus  No Known Allergies  Patient Measurements:    Vital Signs: Temp: 97.9 F (36.6 C) (09/11 0740) Temp src: Oral (09/11 0740) BP: 109/74 mmHg (09/11 0741) Pulse Rate: 70 (09/11 0741)  Labs:  Recent Labs  04/30/13 1830 05/01/13 0535 05/02/13 0610 05/03/13 0630  HGB 15.5*  --   --   --   HCT 43.4  --   --   --   PLT 200  --   --   --   LABPROT  --  14.2 17.2* 17.4*  INR  --  1.12 1.44 1.47  CREATININE 0.66  --   --   --     The CrCl is unknown because both a height and weight (above a minimum accepted value) are required for this calculation.   Medications:  Scheduled:  . ARIPiprazole  5 mg Oral Daily  . feeding supplement  1 Container Oral TID BM  . feeding supplement  1 Container Oral TID BM  . mirtazapine  7.5 mg Oral QHS  . multivitamin with minerals  1 tablet Oral Daily  . nicotine  14 mg Transdermal Daily  . warfarin  6 mg Oral ONCE-1800  . Warfarin - Pharmacist Dosing Inpatient   Does not apply q1800    Assessment: Patients INR is rising slowly.  Will increase dose to 6 mg today and recheck PT/INR in AM.  Goal of Therapy:  INR 2-3    Plan:  Will give 6 mg Coumadin today and recheck INR in AM]  Emily Cameron, Emily Cameron 05/03/2013,8:29 AM

## 2013-05-03 NOTE — Progress Notes (Signed)
[facility] MD Progress Note  05/03/2013 1:20 PM Emily Cameron  MRN:  [id number]  Subjective:  Patient has complain not sleeping well, getting up in middle night, no appetite, eating very little, and getting frustrated and upset. She has been mean to a RN because she did not get more pills she asked her. Patient has been taking her medication and no side effects. She is requested higher dose of remeron. She has rated depression 8/10, and anxiety 6/10. She feels she is not good and denied suicidal thoughts and learning coping skills to control anger and depression. She has stated that her daughter has contacted her and planning to take her home in [place], [place] after discharge and she does not want to change her medicaid in [place].    Diagnosis:   DSM5: Schizophrenia Disorders:   Obsessive-Compulsive Disorders:   Trauma-Stressor Disorders:   Substance/Addictive Disorders:   Depressive Disorders:  Major Depressive Disorder - with Psychotic Features (296.24)  Axis I: Bipolar, Depressed and Major Depression, Recurrent severe  ADL's:  Impaired  Sleep: Fair  Appetite:  Poor  Suicidal Ideation:  Suicidal ideation with plan of walking in front of traffic Homicidal Ideation:  Plan:  NO Intent:  NO Means:  NO AEB (as evidenced by):  Psychiatric Specialty Exam: ROS  Blood pressure 109/74, pulse 70, temperature 97.9 F (36.6 C), temperature source Oral, resp. rate 16, last menstrual period 01/26/2004.There is no weight on file to calculate BMI.  General Appearance: Casual and Guarded  Eye Contact::  Fair  Speech:  Clear and Coherent  Volume:  Normal  Mood:  Anxious and Depressed  Affect:  Depressed and Flat  Thought Process:  Disorganized and Irrelevant  Orientation:  Full (Time, Place, and Person)  Thought Content:  Delusions, Obsessions and Rumination  Suicidal Thoughts:  Yes.  with intent/plan  Homicidal Thoughts:  No  Memory:  Immediate;   Fair  Judgement:  Impaired  Insight:  Lacking   Psychomotor Activity:  Psychomotor Retardation and Restlessness  Concentration:  Fair  Recall:  Fair  Akathisia:  NA  Handed:  Right  AIMS (if indicated):     Assets:  Communication Skills Desire for Improvement Resilience Social Support Transportation  Sleep:  Number of Hours: 5.25   Current Medications: Current Facility-Administered Medications  Medication Dose Route Frequency Provider Last Rate Last Dose  . acetaminophen (TYLENOL) tablet 650 mg  650 mg Oral Q6H PRN Audrea Muscat, NP   650 mg at 05/02/13 0756  . albuterol (PROVENTIL HFA;VENTOLIN HFA) 108 (90 BASE) MCG/ACT inhaler 2 puff  2 puff Inhalation Q6H PRN Evanna Janann August, NP      . alum & mag hydroxide-simeth (MAALOX/MYLANTA) 200-200-20 MG/5ML suspension 30 mL  30 mL Oral Q4H PRN Evanna Janann August, NP      . ARIPiprazole (ABILIFY) tablet 5 mg  5 mg Oral Daily Verne Spurr, PA-C   5 mg at 05/02/13 2001  . feeding supplement (ENSURE) pudding 1 Container  1 Container Oral TID BM Verne Spurr, PA-C      . feeding supplement (RESOURCE BREEZE) liquid 1 Container  1 Container Oral TID BM Jeoffrey Massed, RD      . magnesium hydroxide (MILK OF MAGNESIA) suspension 30 mL  30 mL Oral Daily PRN Evanna Janann August, NP      . mirtazapine (REMERON SOL-TAB) disintegrating tablet 15 mg  15 mg Oral QHS Nehemiah Settle, MD      . multivitamin with minerals tablet 1 tablet  1 tablet Oral Daily Jeoffrey Massed, RD   1 tablet at 05/03/13 0820  . nicotine (NICODERM CQ - dosed in mg/24 hours) patch 14 mg  14 mg Transdermal Daily Verne Spurr, PA-C   14 mg at 05/03/13 1478  . warfarin (COUMADIN) tablet 6 mg  6 mg Oral ONCE-1800 Malva Cogan, [facility]      . Warfarin - Pharmacist Dosing Inpatient   Does not apply q1800 Shuvon Rankin, NP        Lab Results:  Results for orders placed during the hospital encounter of 05/01/13 (from the past 48 hour(s))  PROTIME-INR     Status: Abnormal   Collection Time    05/02/13  6:10 AM       Result Value Range   Prothrombin Time 17.2 (*) 11.6 - 15.2 seconds   INR 1.44  0.00 - 1.49   Comment: Performed at [facility]  PROTIME-INR     Status: Abnormal   Collection Time    05/03/13  6:30 AM      Result Value Range   Prothrombin Time 17.4 (*) 11.6 - 15.2 seconds   INR 1.47  0.00 - 1.49   Comment: Performed at [facility]    Physical Findings: AIMS: Facial and Oral Movements Muscles of Facial Expression: None, normal Lips and Perioral Area: None, normal Jaw: None, normal Tongue: None, normal,Extremity Movements Upper (arms, wrists, hands, fingers): None, normal Lower (legs, knees, ankles, toes): None, normal, Trunk Movements Neck, shoulders, hips: None, normal, Overall Severity Severity of abnormal movements (highest score from questions above): None, normal Incapacitation due to abnormal movements: None, normal Patient's awareness of abnormal movements (rate only patient's report): No Awareness, Dental Status Current problems with teeth and/or dentures?: No Does patient usually wear dentures?: No  CIWA:  CIWA-Ar Total: 4 COWS:  COWS Total Score: 2  Treatment Plan Summary: Daily contact with patient to assess and evaluate symptoms and progress in treatment Medication management  Plan: Increase Remeron 15 mg PO Qhs Increase Abilify 5 mg PO BID Treatment Plan/Recommendations:  1. Admit for crisis management and stabilization. 2. Medication management to reduce current symptoms to base line and improve the patient's overall level of functioning. 3. Treat health problems as indicated. 4. Develop treatment plan to decrease risk of relapse upon discharge and to reduce the need for readmission. 5. Psycho-social education regarding relapse prevention and self care. 6. Health care follow up as needed for medical problems. 7. Restart home medications where appropriate.   Medical Decision Making Problem Points:  Established  problem, worsening (2), Review of last therapy session (1) and Review of psycho-social stressors (1) Data Points:  Review or order clinical lab tests (1) Review or order medicine tests (1) Review of medication regiment & side effects (2) Review of new medications or change in dosage (2)  I certify that inpatient services furnished can reasonably be expected to improve the patient's condition.   Nehemiah Settle., MD 05/03/2013, 1:20 PM

## 2013-05-03 NOTE — Progress Notes (Signed)
The focus of this group is to educate the patient on the purpose and policies of crisis stabilization and provide a format to answer questions about their admission.  The group details unit policies and expectations of patients while admitted.  Patient did not attend the group this morning.

## 2013-05-03 NOTE — Progress Notes (Signed)
D: Pt mood is depressed but pt brightens upon approach. She rates her depression 3/10. Pt interacts well with her peers and is very pleasant.  A: Support given. Verbalization encouraged. Pt encouraged to come to nurse with any concerns.  R: Pt is receptive. No complaints of pain or discomfort at this time. Q15 min checks maintained for safety. Will continue to monitor pt.

## 2013-05-03 NOTE — Progress Notes (Signed)
Adult Psychoeducational Group Note  Date:  05/03/2013 Time:  10:00am Group Topic/Focus:  Therapeutic Activity  Participation Level:  Active  Participation Quality:  Appropriate and Attentive  Affect:  Appropriate  Cognitive:  Appropriate  Insight: Appropriate  Engagement in Group:  Engaged  Modes of Intervention:  Discussion and Education  Additional Comments:  Pt attended and participated in group. Pt was ask what is a good memory and pt stated When I am with my sisters and we are getting along.  Shelly Bombard D 05/03/2013, 12:57 PM

## 2013-05-04 DIAGNOSIS — F333 Major depressive disorder, recurrent, severe with psychotic symptoms: Principal | ICD-10-CM

## 2013-05-04 MED ORDER — WARFARIN SODIUM 2.5 MG PO TABS
2.5000 mg | ORAL_TABLET | Freq: Every day | ORAL | Status: DC
  Filled 2013-05-04 (×2): qty 2

## 2013-05-04 MED ORDER — ARIPIPRAZOLE 5 MG PO TABS: 5.0000 mg | ORAL_TABLET | Freq: Two times a day (BID) | ORAL | Status: DC

## 2013-05-04 MED ORDER — WARFARIN SODIUM 5 MG PO TABS
5.0000 mg | ORAL_TABLET | Freq: Once | ORAL | Status: DC
  Filled 2013-05-04: qty 1

## 2013-05-04 MED ORDER — WARFARIN SODIUM 2.5 MG PO TABS: 2.5000 mg | ORAL_TABLET | Freq: Every day | ORAL | Status: DC

## 2013-05-04 MED ORDER — MIRTAZAPINE 15 MG PO TBDP: 15.0000 mg | ORAL_TABLET | Freq: Every day | ORAL | Status: DC

## 2013-05-04 MED ORDER — WARFARIN SODIUM 5 MG PO TABS: ORAL_TABLET | ORAL | Status: DC

## 2013-05-04 NOTE — Progress Notes (Signed)
[facility] Adult Case Management Discharge Plan :  Will you be returning to the same living situation after discharge: Yes,  patient returning home with son. At discharge, do you have transportation home?:Yes,  patient advise her son will pick her up. Do you have the ability to pay for your medications:No. Patient will be assisted with indigent medications.   Release of information consent forms completed and in the chart;  Patient's signature needed at discharge.  Patient to Follow up at: Follow-up Information   Follow up with Wynona Canes [facility] On 05/10/2013. (You are scheduled with Wynona Canes on Thursday, May 10, 2013 at 3:00 PM.  Please bring your driver's license and Social Security card)    Contact information:   [address] [place], [place]   [address]  [phone]      Patient denies SI/HI:   Patient no longer endorsing SI/HI or other thoughts of self harm.   Safety Planning and Suicide Prevention discussed:  .Reviewed with all patients during discharge planning group   Emily Cameron, Emily Cameron July 05/04/2013, 10:20 AM

## 2013-05-04 NOTE — [facility] Suicide Risk Assessment (Signed)
Suicide Risk Assessment  Discharge Assessment     Demographic Factors:  Adolescent or young adult, Low socioeconomic status and Unemployed  Mental Status Per Nursing Assessment::   On Admission:  Self-harm thoughts;Suicidal ideation indicated by patient  Current Mental Status by Physician: Mental Status Examination: Patient appeared as per his stated age, casually dressed, and fairly groomed, and maintaining good eye contact. Patient has good mood and his affect was constricted. He has normal rate, rhythm, and volume of speech. His thought process is linear and goal directed. Patient has denied suicidal, homicidal ideations, intentions or plans. Patient has no evidence of auditory or visual hallucinations, delusions, and paranoia. Patient has fair insight judgment and impulse control.  Loss Factors: Financial problems/change in socioeconomic status  Historical Factors: Prior suicide attempts and Impulsivity  Risk Reduction Factors:   Sense of responsibility to family, Religious beliefs about death, Living with another person, especially a relative, Positive social support, Positive therapeutic relationship and Positive coping skills or problem solving skills  Continued Clinical Symptoms:  Depression:   Recent sense of peace/wellbeing Unstable or Poor Therapeutic Relationship Previous Psychiatric Diagnoses and Treatments Medical Diagnoses and Treatments/Surgeries  Cognitive Features That Contribute To Risk:  Polarized thinking    Suicide Risk:  Minimal: No identifiable suicidal ideation.  Patients presenting with no risk factors but with morbid ruminations; may be classified as minimal risk based on the severity of the depressive symptoms  Discharge Diagnoses:   AXIS I:  Major Depression, Recurrent severe AXIS II:  Deferred AXIS III:   Past Medical History  Diagnosis Date  . Anxiety   . Depression   . Insomnia   . Weight loss     due to anxiety  . PE (pulmonary embolism)  2006  . Alcohol abuse   . Polysubstance abuse     Current smoking and alcohol. 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 Marijuna not taking since 7 month.   Marland Kitchen COPD (chronic obstructive pulmonary disease)    AXIS IV:  economic problems, other psychosocial or environmental problems, problems related to social environment and problems with primary support group AXIS V:  61-70 mild symptoms  Plan Of Care/Follow-up recommendations:  Activity:   as toleratteed Diet:  Regular  Is patient on multiple antipsychotic therapies at discharge:  No   Has Patient had three or more failed trials of antipsychotic monotherapy by history:  No  Recommended Plan for Multiple Antipsychotic Therapies: NA  Gerlad Pelzel,JANARDHAHA R. 05/04/2013, 11:01 AM

## 2013-05-04 NOTE — Progress Notes (Signed)
ANTICOAGULATION CONSULT NOTE - Follow Up Consult  Pharmacy Consult for Coumadin Indication: H/O PE  No Known Allergies  Labs:  Recent Labs  05/02/13 0610 05/03/13 0630 05/04/13 0615  LABPROT 17.2* 17.4* 19.8*  INR 1.44 1.47 1.74*    The CrCl is unknown because both a height and weight (above a minimum accepted value) are required for this calculation.   Medications:  Scheduled:  . ARIPiprazole  5 mg Oral BID  . feeding supplement  1 Container Oral TID BM  . feeding supplement  1 Container Oral TID BM  . mirtazapine  15 mg Oral QHS  . multivitamin with minerals  1 tablet Oral Daily  . nicotine  14 mg Transdermal Daily  . Warfarin - Pharmacist Dosing Inpatient   Does not apply q1800    Assessment: INR increasing.  No problems with therapy  noted.     Goal of Therapy:  INR 2-3    Plan:  Coumadin 5 mg po x 1 today  PT/INR in am    Charyl Dancer 05/04/2013,8:04 AM

## 2013-05-04 NOTE — [facility] Group Notes (Signed)
[facility] LCSW Aftercare Discharge Planning Group Note   05/04/2013 10:24 AM  Participation Quality:  Appropriate  Mood/Affect:  Appropriate  Depression Rating:  1  Anxiety Rating:  1  Thoughts of Suicide:  No  Will you contract for safety?   NA  Current AVH:  No  Plan for Discharge/Comments:  Patient attending discharge planning group and actively participated in group.  She reports she plans to return to [place] and will need follow up scheduled.  She was advised an appointment has been scheduled.  CSW provided all participants with daily workbook and information on services offered by [facility].   Transportation Means: Patient has transportation.   Supports:  Patient has a good support system.   Emily Cameron, Joesph July

## 2013-05-04 NOTE — Progress Notes (Signed)
Adult Psychoeducational Group Note  Date:  05/04/2013 Time:  1:17 PM  Group Topic/Focus:  Relapse Prevention Planning:   The focus of this group is to define relapse and discuss the need for planning to combat relapse.  Participation Level:  Active  Participation Quality:  Attentive  Affect:  Appropriate  Cognitive:  Appropriate  Insight: Good  Engagement in Group:  Engaged  Modes of Intervention:  Discussion, Education, Rapport Building, Paediatric nurse, Socialization and Support  Additional Comments:    Reynolds Bowl 05/04/2013, 1:17 PM

## 2013-05-04 NOTE — Tx Team (Signed)
Interdisciplinary Treatment Plan Update   Date Reviewed:  05/04/2013  Time Reviewed:  9:41 AM  Progress in Treatment:   Attending groups: Yes Participating in groups: Yes Taking medication as prescribed: Yes  Tolerating medication: Yes Family/Significant other contact made: Yes, contact made with daughter. Patient understands diagnosis: Yes  Discussing patient identified problems/goals with staff: Yes Medical problems stabilized or resolved: Yes Denies suicidal/homicidal ideation: Yes Patient has not harmed self or others: Yes  For review of initial/current patient goals, please see plan of care.  Estimated Length of Stay:  Discharge today  Reasons for Continued Hospitalization:   New Problems/Goals identified:    Discharge Plan or Barriers:   Home with outpatient follow up with [facility] in [place]  Additional Comments: N/A  Attendees:  Patient: Emily Cameron 05/04/2013 9:41 AM   Signature: Mervyn Gay, MD 05/04/2013 9:41 AM  Signature:  Verne Spurr, PA 05/04/2013 9:41 AM  Signature: Harold Barban, RN 05/04/2013 9:41 AM  Signature:  Cheron Every, RN 05/04/2013 9:41 AM  Signature:   05/04/2013 9:41 AM  Signature:  Juline Patch, LCSW 05/04/2013 9:41 AM  Signature:  Reyes Ivan, LCSW 05/04/2013 9:41 AM  Signature:  Maseta Dorley,Care Coordinator 05/04/2013 9:41 AM  Signature:   05/04/2013 9:41 AM  Signature:  05/04/2013  9:41 AM  Signature:    Signature:      Scribe for Treatment Team:   Juline Patch,  05/04/2013 9:41 AM

## 2013-05-04 NOTE — Progress Notes (Signed)
Discharge Note: Discharge instructions/prescriptions/medication samples given to patient. Patient verbalized understanding of discharge instructions and prescriptions. Returned belongings to patient. Denies SI/HI/AVH. Patient d/c without incident to the front lobby and transported home by son.

## 2013-05-04 NOTE — [facility] Group Notes (Signed)
[facility] LCSW Group Therapy [facility] LCSW Group Therapy  Feelings Around Relapse 1:15 -2:30        05/04/2013  2:38 PM   Type of Therapy:  Group Therapy  Participation Level:  Appropriate  Participation Quality:  Appropriate  Affect:  Appropriate  Cognitive:  Attentive Appropriate  Insight:  Developing/Improving  Engagement in Therapy: Developing/Improving  Modes of Intervention:  Discussion Exploration Problem-Solving Supportive  Summary of Progress/Problems:  The topic for today was feelings around relapse.  Patient processed feelings toward relapse and was able to relate to peers. Patient identified coping skills that can be used to prevent a relapse.  She shared she tends to isolate and become angry.  Patient shared she will also make harsh statements to family and then become upset when she apologizes and the apology is not accepted.  Patient cautioned that words can not be taken back and it may take family/friends time to let go of her harsh statements.   Wynn Banker 05/04/2013 2:38 PM
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
## 2013-05-04 NOTE — [facility] Suicide Risk Assessment (Signed)
[facility] INPATIENT:  Family/Significant Other Suicide Prevention Education  Suicide Prevention Education:  Education Completed; Jennylee Uehara, Daughter, [phone]; has been identified by the patient as the family member/significant other with whom the patient will be residing, and identified as the person(s) who will aid the patient in the event of a mental health crisis (suicidal ideations/suicide attempt).  With written consent from the patient, the family member/significant other has been provided the following suicide prevention education, prior to the and/or following the discharge of the patient.  The suicide prevention education provided includes the following:  Suicide risk factors  Suicide prevention and interventions  National Suicide Hotline telephone number  [facility] assessment telephone number  [facility] Emergency Assistance 911  [facility] and/or Residential Mobile Crisis Unit telephone number  Request made of family/significant other to:  Remove weapons (e.g., guns, rifles, knives), all items previously/currently identified as safety concern.  Daughter reports patient does not have access to guns.  Remove drugs/medications (over-the-counter, prescriptions, illicit drugs), all items previously/currently identified as a safety concern.  The family member/significant other verbalizes understanding of the suicide prevention education information provided.  The family member/significant other agrees to remove the items of safety concern listed above.  Wynn Banker 05/04/2013, 10:59 AM

## 2013-05-08 NOTE — Progress Notes (Signed)
Patient Discharge Instructions:  After Visit Summary (AVS):   Faxed to:  05/08/13 Discharge Summary Note:   Faxed to:  05/08/13 Psychiatric Admission Assessment Note:   Faxed to:  05/08/13 Suicide Risk Assessment - Discharge Assessment:   Faxed to:  05/08/13 Faxed/Sent to the Next Level Care provider:  05/08/13 Faxed to [facility] @ [phone]  Jerelene Redden, 05/08/2013, 4:23 PM

## 2013-09-14 ENCOUNTER — Emergency Department (HOSPITAL_COMMUNITY)
Admission: EM | Admit: 2013-09-14 | Discharge: 2013-09-14 | Disposition: A | Discharge status: Home / Self Care | Payer: Medicaid Other | Attending: Emergency Medicine | Admitting: Emergency Medicine

## 2013-09-14 ENCOUNTER — Encounter (HOSPITAL_COMMUNITY): Payer: Self-pay | Admitting: Emergency Medicine

## 2013-09-14 DIAGNOSIS — R1110 Vomiting, unspecified: Secondary | ICD-10-CM | POA: Insufficient documentation

## 2013-09-14 DIAGNOSIS — Z8659 Personal history of other mental and behavioral disorders: Secondary | ICD-10-CM | POA: Insufficient documentation

## 2013-09-14 DIAGNOSIS — F1021 Alcohol dependence, in remission: Secondary | ICD-10-CM | POA: Insufficient documentation

## 2013-09-14 DIAGNOSIS — N390 Urinary tract infection, site not specified: Secondary | ICD-10-CM | POA: Insufficient documentation

## 2013-09-14 DIAGNOSIS — J4489 Other specified chronic obstructive pulmonary disease: Secondary | ICD-10-CM | POA: Insufficient documentation

## 2013-09-14 DIAGNOSIS — F17200 Nicotine dependence, unspecified, uncomplicated: Secondary | ICD-10-CM | POA: Insufficient documentation

## 2013-09-14 DIAGNOSIS — J449 Chronic obstructive pulmonary disease, unspecified: Secondary | ICD-10-CM | POA: Insufficient documentation

## 2013-09-14 DIAGNOSIS — Z86711 Personal history of pulmonary embolism: Secondary | ICD-10-CM | POA: Insufficient documentation

## 2013-09-14 LAB — CBC WITH DIFFERENTIAL/PLATELET
BASOS ABS: 0 10*3/uL (ref 0.0–0.1)
Basophils Relative: 0 % (ref 0–1)
EOS PCT: 1 % (ref 0–5)
Eosinophils Absolute: 0.1 10*3/uL (ref 0.0–0.7)
HEMATOCRIT: 36.7 % (ref 36.0–46.0)
Hemoglobin: 13 g/dL (ref 12.0–15.0)
LYMPHS PCT: 30 % (ref 12–46)
Lymphs Abs: 2.8 10*3/uL (ref 0.7–4.0)
MCH: 32.1 pg (ref 26.0–34.0)
MCHC: 35.4 g/dL (ref 30.0–36.0)
MCV: 90.6 fL (ref 78.0–100.0)
MONO ABS: 0.7 10*3/uL (ref 0.1–1.0)
Monocytes Relative: 8 % (ref 3–12)
Neutro Abs: 5.6 10*3/uL (ref 1.7–7.7)
Neutrophils Relative %: 61 % (ref 43–77)
Platelets: 248 10*3/uL (ref 150–400)
RBC: 4.05 MIL/uL (ref 3.87–5.11)
RDW: 13.8 % (ref 11.5–15.5)
WBC: 9.2 10*3/uL (ref 4.0–10.5)

## 2013-09-14 LAB — COMPREHENSIVE METABOLIC PANEL
ALBUMIN: 3.5 g/dL (ref 3.5–5.2)
ALK PHOS: 76 U/L (ref 39–117)
ALT: 10 U/L (ref 0–35)
AST: 14 U/L (ref 0–37)
BUN: 12 mg/dL (ref 6–23)
CO2: 27 mEq/L (ref 19–32)
Calcium: 9.1 mg/dL (ref 8.4–10.5)
Chloride: 103 mEq/L (ref 96–112)
Creatinine, Ser: 0.85 mg/dL (ref 0.50–1.10)
GFR calc Af Amer: >90 mL/min (ref >90)
GFR calc non Af Amer: 79 mL/min — ABNORMAL LOW (ref >90)
Glucose, Bld: 111 mg/dL — ABNORMAL HIGH (ref 70–99)
POTASSIUM: 3.2 meq/L — AB (ref 3.7–5.3)
SODIUM: 142 meq/L (ref 137–147)
TOTAL PROTEIN: 7.6 g/dL (ref 6.0–8.3)
Total Bilirubin: 0.3 mg/dL (ref 0.3–1.2)

## 2013-09-14 LAB — URINALYSIS, ROUTINE W REFLEX MICROSCOPIC
BILIRUBIN URINE: NEGATIVE
Glucose, UA: NEGATIVE mg/dL
KETONES UR: NEGATIVE mg/dL
NITRITE: NEGATIVE
PH: 6.5 (ref 5.0–8.0)
Protein, ur: NEGATIVE mg/dL
Specific Gravity, Urine: 1.009 (ref 1.005–1.030)
Urobilinogen, UA: 0.2 mg/dL (ref 0.0–1.0)

## 2013-09-14 LAB — URINE MICROSCOPIC-ADD ON

## 2013-09-14 LAB — LIPASE, BLOOD: LIPASE: 32 U/L (ref 11–59)

## 2013-09-14 MED ORDER — ONDANSETRON HCL 4 MG/2ML IJ SOLN
4.0000 mg | Freq: Once | INTRAMUSCULAR | Status: AC
  Administered 2013-09-14: 4 mg via INTRAVENOUS
  Filled 2013-09-14: qty 2

## 2013-09-14 MED ORDER — MORPHINE SULFATE 4 MG/ML IJ SOLN
4.0000 mg | Freq: Once | INTRAMUSCULAR | Status: AC
  Administered 2013-09-14: 4 mg via INTRAVENOUS
  Filled 2013-09-14: qty 1

## 2013-09-14 MED ORDER — CEPHALEXIN 250 MG PO CAPS
500.0000 mg | ORAL_CAPSULE | Freq: Once | ORAL | Status: AC
  Administered 2013-09-14: 500 mg via ORAL
  Filled 2013-09-14: qty 2

## 2013-09-14 MED ORDER — SODIUM CHLORIDE 0.9 % IV BOLUS (SEPSIS)
1000.0000 mL | Freq: Once | INTRAVENOUS | Status: AC
  Administered 2013-09-14: 1000 mL via INTRAVENOUS

## 2013-09-14 MED ORDER — CEPHALEXIN 500 MG PO CAPS: 500.0000 mg | ORAL_CAPSULE | Freq: Four times a day (QID) | ORAL | Status: DC

## 2013-09-14 NOTE — Discharge Instructions (Signed)
Urinary Tract Infection Urinary tract infections (UTIs) can develop anywhere along your urinary tract. Your urinary tract is your body's drainage system for removing wastes and extra water. Your urinary tract includes two kidneys, two ureters, a bladder, and a urethra. Your kidneys are a pair of bean-shaped organs. Each kidney is about the size of your fist. They are located below your ribs, one on each side of your spine. CAUSES Infections are caused by microbes, which are microscopic organisms, including fungi, viruses, and bacteria. These organisms are so small that they can only be seen through a microscope. Bacteria are the microbes that most commonly cause UTIs. SYMPTOMS  Symptoms of UTIs may vary by age and gender of the patient and by the location of the infection. Symptoms in young women typically include a frequent and intense urge to urinate and a painful, burning feeling in the bladder or urethra during urination. Older women and men are more likely to be tired, shaky, and weak and have muscle aches and abdominal pain. A fever may mean the infection is in your kidneys. Other symptoms of a kidney infection include pain in your back or sides below the ribs, nausea, and vomiting. DIAGNOSIS To diagnose a UTI, your caregiver will ask you about your symptoms. Your caregiver also will ask to provide a urine sample. The urine sample will be tested for bacteria and white blood cells. White blood cells are made by your body to help fight infection. TREATMENT  Typically, UTIs can be treated with medication. Because most UTIs are caused by a bacterial infection, they usually can be treated with the use of antibiotics. The choice of antibiotic and length of treatment depend on your symptoms and the type of bacteria causing your infection. HOME CARE INSTRUCTIONS  If you were prescribed antibiotics, take them exactly as your caregiver instructs you. Finish the medication even if you feel better after you  have only taken some of the medication.  Drink enough water and fluids to keep your urine clear or pale yellow.  Avoid caffeine, tea, and carbonated beverages. They tend to irritate your bladder.  Empty your bladder often. Avoid holding urine for long periods of time.  Empty your bladder before and after sexual intercourse.  After a bowel movement, women should cleanse from front to back. Use each tissue only once. SEEK MEDICAL CARE IF:   You have back pain.  You develop a fever.  Your symptoms do not begin to resolve within 3 days. SEEK IMMEDIATE MEDICAL CARE IF:   You have severe back pain or lower abdominal pain.  You develop chills.  You have nausea or vomiting.  You have continued burning or discomfort with urination. MAKE SURE YOU:   Understand these instructions.  Will watch your condition.  Will get help right away if you are not doing well or get worse. Document Released: 05/19/2005 Document Revised: 02/08/2012 Document Reviewed: 09/17/2011 [facility] Patient Information 2014 [place].  Emergency Department Resource Guide 1) Find a Doctor and Pay Out of Pocket Although you won't have to find out who is covered by your insurance plan, it is a good idea to ask around and get recommendations. You will then need to call the office and see if the doctor you have chosen will accept you as a new patient and what types of options they offer for patients who are self-pay. Some doctors offer discounts or will set up payment plans for their patients who do not have insurance, but you will need  to ask so you aren't surprised when you get to your appointment.  2) Contact Your Local Health Department Not all health departments have doctors that can see patients for sick visits, but many do, so it is worth a call to see if yours does. If you don't know where your local health department is, you can check in your phone book. The CDC also has a tool to help you locate your  state's health department, and many state websites also have listings of all of their local health departments.  3) Find a [facility] If your illness is not likely to be very severe or complicated, you may want to try a walk in clinic. These are popping up all over the country in pharmacies, drugstores, and shopping centers. They're usually staffed by nurse practitioners or physician assistants that have been trained to treat common illnesses and complaints. They're usually fairly quick and inexpensive. However, if you have serious medical issues or chronic medical problems, these are probably not your best option.  No Primary Care Doctor: - Call Health Connect at  [phone] - they can help you locate a primary care doctor that  accepts your insurance, provides certain services, etc. - Physician Referral Service- [phone]  Chronic Pain Problems: Organization         Address  Phone   Notes  [facility]  [phone] Patients need to be referred by their primary care doctor.   Medication Assistance: Organization         Address  Phone   Notes  [facility] Medication [facility] [address] [place], [place] [address] [phone] --Must be a resident of [facility] -- Must have NO insurance coverage whatsoever (no Medicaid/ Medicare, etc.) -- The pt. MUST have a primary care doctor that directs their care regularly and follows them in the community   MedAssist  [phone]   [employer]  [phone]    Agencies that provide inexpensive medical care: Organization         Address  Phone   Notes  [place]  [phone]   [facility]    [phone]   [facility] [place], [place] [address] [phone]   [place] [address], [place] [phone]   Planned Parenthood    [phone]   [facility]    [phone]   [address], [place] Phone:  [phone], Fax:  [phone] Hours of Operation:  9 am - 6 pm, M-F.  Also accepts Medicaid/Medicare and self-pay.  [facility] for [place], [address], [place] Phone: [phone], Fax: [phone]. Hours of Operation:  8:30 am - 5:30 pm, M-F.  Also accepts Medicaid and self-pay.  [facility] [address], [place] Phone: [phone]   [place], [place], [place] [phone], Ext. [phone] Mondays & Thursdays: 7-9 AM.  First 15 patients are seen on a first come, first serve basis.    [place] Providers:  Organization         Address  Phone   Notes  [facility] [address], [place] [phone] Also accepts self-pay patients.  [facility] [address], [place], [place]  (  [place]) [place], [address], [place] [phone]   [place] [address] [place] [phone]   Lucianne Lei [address], [place]   [phone] Only accepts [place] Access [place] patients after they have their name applied to their card.   Self-Pay (no insurance) in [facility]:  Organization         Address  Phone   Notes  Sickle Cell Patients, [facility] [place] [phone]   [facility] [place] [phone]   [facility] [place]  [place], [place], [place] [phone]   [facility]/Dr. Osei-Bonsu  [address], [place] or [address], [place] [phone] Phone number for both [place] and [place] locations is the same.  Urgent Medical and [facility] [address] [place] [phone]   [facility] [address], [place] or [address] [phone]   [facility] [address] [place] [phone], phone; [phone], fax Sees patients 1st and 3rd Saturday of every month.  Must not qualify for public or private insurance (i.e. Medicaid, Medicare, [place] Health Choice, Veterans' Benefits)  Household income should be no more than 200% of the poverty level The clinic cannot treat you if you are pregnant or think you are pregnant  Sexually transmitted diseases are not treated at the clinic.    Dental Care: Organization         Address  Phone  Notes  [facility] Department of [facility] [place] [phone] Accepts children up to age 58 who are enrolled in [place] or [place]; pregnant women with a Medicaid card; and children who have applied for Medicaid or [place] Health Choice, but were declined, whose parents can pay a reduced fee at time of service.  [facility] Department of [facility]  [address], [place] [phone] Accepts children up to age 31 who are enrolled in [place] or [place]; pregnant women with a Medicaid card; and children who have applied for Medicaid or [place] Health Choice, but were declined, whose parents can pay a reduced fee at time of service.  [place] Adult [facility] Access PROGRAM  [place] [phone] Patients are seen by appointment only. Walk-ins are not accepted. [place] will see patients 61 years of age and older. Monday - Tuesday (8am-5pm) Most Wednesdays (8:30-5pm) $30 per visit, cash only  [facility] Adult Dental Access PROGRAM  [address], [facility] [phone] Patients are seen by appointment only. Walk-ins are not accepted. [place] will see patients 47 years of age and older. One Wednesday Evening (Monthly: Volunteer  Based).  $30 per visit, cash only  [place]  [phone] for adults; Children under age 62, call Graduate Pediatric Dentistry at [phone]. Children aged 42-14, please call [phone] to request a pediatric application.  Dental services are provided in all areas of dental care including fillings, crowns and bridges, complete and partial dentures, implants, gum treatment, root canals, and extractions. Preventive care is also provided. Treatment is provided to both adults and children. Patients are selected via a lottery and there is  often a waiting list.   [facility] [address], [place]  [phone] [email or web address]   [facility] [address] [place], [place] [phone], Ext. [phone] Second and Fourth Thursday of each month, opens at 6:30 AM; Clinic ends at 9 AM.  Patients are seen on a first-come first-served basis, and a limited number are seen during each clinic.   [facility]  [address] Hillard Danker [place], [place] [phone]   Eligibility Requirements You must have lived in [place], [place], or [place] counties for at least the last three months.   You cannot be eligible for state or federal sponsored [employer], including [employer], [place], or [employer].   You generally cannot be eligible for healthcare insurance through your employer.    How to apply: Eligibility screenings are held every Tuesday and Wednesday afternoon from 1:00 pm until 4:00 pm. You do not need an appointment for the interview!  [facility] [address], [place], [place]   [place]  [place] Department  [place]  [phone]    Behavioral Health Resources in the Community: Intensive Outpatient Programs Organization         Address  Phone  Notes  [place]. [address] [place], [place] [phone]   [facility] Outpatient [address] [place], [place]   ADS: Alcohol & Drug Svcs [address] [place], [place]   [place] [address],  [place], [place] or [phone]   Substance Abuse Resources Organization         Address  Phone  Notes  Alcohol and Drug Services  [phone]   [place]  [phone]   The [place]   Chinita Pester  [phone]   Residential & Outpatient Substance Abuse Program  [phone]   Psychological Services Organization         Address  Phone  Notes  [facility] [place]  [place]  [phone]   [place] [address], [place] or [phone]    Mobile Crisis Teams Organization         Address  Phone  Notes  [facility]atives, Mobile Crisis Care Unit  [phone]   [facility]otherapeutic Services  [address] [place], [place]   [place] [address], [place] [phone]    Self-Help/Support Groups Organization         Address  Phone             Notes  [place]. of [place] - variety of support groups  [place] Call for more information  Narcotics Anonymous (NA), [facility] [address], [employer] [place]  2 meetings at this location   Special educational needs teacher         Address  Phone  Notes  [facility] Residential Treatment [place],    [place]  [phone]   [facility]  [address] [place] [address], [place], [place]   [place], [place] [phone] Admissions: 8am-3pm M-F  Incentives Substance [place] [address],    [place], [place]   [facility] [address] [place], [place], [place]  [facility] [address]  [place], [place]   [facility]ight Programs - Intensive Outpatient [address], [place], [place]   [facility] ([place].,  [place], [place] [phone] or [phone]   Residential Treatment Services (RTS) [address] [place], [place] Accepts Medicaid  [facility] [place] [address],  [place] [phone] Substance Abuse/Addiction Treatment   [facility] Organization         Address  Phone  Notes  [facility]  [phone]   Domenic Schwab, PhD [address] Arlis [place], [place]   [phone] or [phone]   [place], [place] [phone]   [address], [place], [place] [phone] Insurance/Medicaid/sponsorship through [facility] and Families [address]                                    [place], [place] [phone] [place] [address], [place] [phone]    Dr. Adele Schilder  [phone]   [facility] Dept. 1) [address] [place] 2) [place] 3)  [place] [address], [place] [phone]  [phone]   [place] [phone] or [phone] (After Hours)

## 2013-09-14 NOTE — ED Notes (Signed)
Pt with acute onset lower abdominal pain and emesis x 1 hour.  She thinks she ate a bad piece of chicken.

## 2013-09-14 NOTE — ED Notes (Signed)
Pt given sprite 

## 2013-09-14 NOTE — ED Provider Notes (Signed)
Medical screening examination/treatment/procedure(s) were performed by non-physician practitioner and as supervising physician I was immediately available for consultation/collaboration.  Richarda Blade, MD 09/14/13 2022

## 2013-09-14 NOTE — ED Provider Notes (Signed)
CSN: [id number]     Arrival date & time 09/14/13  1457 History   First MD Initiated Contact with Patient 09/14/13 1659     Chief Complaint  Patient presents with  . Abdominal Pain  . Emesis   (Consider location/radiation/quality/duration/timing/severity/associated sxs/prior Treatment) HPI Comments: Patient presents to the emergency department with chief complaints of lower, pain and vomiting that started one hour ago. She states that she was "fooling around with her boyfriend," when the symptoms began. She denies fevers, diarrhea, or constipation. Denies any chest pain or shortness of breath. States the pain waxes and wanes. She has not tried anything to alleviate her symptoms.  The history is provided by the patient. No language interpreter was used.    Past Medical History  Diagnosis Date  . Anxiety   . Depression   . Insomnia   . Weight loss     due to anxiety  . PE (pulmonary embolism) 2006  . Alcohol abuse   . Polysubstance abuse     Current smoking and alcohol. 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 Marijuna not taking since 7 month.   Marland Kitchen COPD (chronic obstructive pulmonary disease)    Past Surgical History  Procedure Laterality Date  . Abdominal hysterectomy     Family History  Problem Relation Age of Onset  . Coronary artery disease Sister   . Heart disease Mother   . Hyperlipidemia Mother   . Hypertension Mother   . Aneurysm Brother 27  . Aneurysm Paternal Aunt 34    Died   . Heart disease Maternal Grandmother   . Pulmonary embolism Brother    History  Substance Use Topics  . Smoking status: Current Every Day Smoker -- 0.50 packs/day for 31 years    Types: Cigarettes  . Smokeless tobacco: Never Used     Comment: TO LET DOCTOR KNOW WHEN READY TO QUIT.  Marland Kitchen Alcohol Use: Yes     Comment: 2 beers a week   OB History   Grav Para Term Preterm Abortions TAB SAB Ect Mult Living                 Review of Systems  All other systems reviewed and are  negative.    Allergies  Review of patient's allergies indicates no known allergies.  Home Medications  No current outpatient prescriptions on file. BP 120/71  Pulse 68  Temp(Src) 97.6 F (36.4 C) (Oral)  Resp 18  Ht 5\' 4"  (1.626 m)  Wt 115 lb (52.164 kg)  BMI 19.73 kg/m2  SpO2 97%  LMP 01/26/2004 Physical Exam  Nursing note and vitals reviewed. Constitutional: She is oriented to person, place, and time. She appears well-developed and well-nourished.  HENT:  Head: Normocephalic and atraumatic.  Eyes: Conjunctivae and EOM are normal. Pupils are equal, round, and reactive to light.  Neck: Normal range of motion. Neck supple.  Cardiovascular: Normal rate and regular rhythm.  Exam reveals no gallop and no friction rub.   No murmur heard. Pulmonary/Chest: Effort normal and breath sounds normal. No respiratory distress. She has no wheezes. She has no rales. She exhibits no tenderness.  Abdominal: Soft. Bowel sounds are normal. She exhibits no distension and no mass. There is no tenderness. There is no rebound and no guarding.  Mild lower abdominal tenderness, no localized tenderness, no pain at McBurney's point, no right upper quadrant tenderness, or Murphy's sign, no fluid wave, or signs of peritonitis  Musculoskeletal: Normal range of motion. She exhibits  no edema and no tenderness.  Neurological: She is alert and oriented to person, place, and time.  Skin: Skin is warm and dry.  Psychiatric: She has a normal mood and affect. Her behavior is normal. Judgment and thought content normal.    ED Course  Procedures (including critical care time) Results for orders placed during the hospital encounter of 09/14/13  LIPASE, BLOOD      Result Value Range   Lipase 32  11 - 59 U/L  COMPREHENSIVE METABOLIC PANEL      Result Value Range   Sodium 142  137 - 147 mEq/L   Potassium 3.2 (*) 3.7 - 5.3 mEq/L   Chloride 103  96 - 112 mEq/L   CO2 27  19 - 32 mEq/L   Glucose, Bld 111 (*) 70 - 99  mg/dL   BUN 12  6 - 23 mg/dL   Creatinine, Ser 0.85  0.50 - 1.10 mg/dL   Calcium 9.1  8.4 - 10.5 mg/dL   Total Protein 7.6  6.0 - 8.3 g/dL   Albumin 3.5  3.5 - 5.2 g/dL   AST 14  0 - 37 U/L   ALT 10  0 - 35 U/L   Alkaline Phosphatase 76  39 - 117 U/L   Total Bilirubin 0.3  0.3 - 1.2 mg/dL   GFR calc non Af Amer 79 (*) >90 mL/min   GFR calc Af Amer >90  >90 mL/min  CBC WITH DIFFERENTIAL      Result Value Range   WBC 9.2  4.0 - 10.5 K/uL   RBC 4.05  3.87 - 5.11 MIL/uL   Hemoglobin 13.0  12.0 - 15.0 g/dL   HCT 36.7  36.0 - 46.0 %   MCV 90.6  78.0 - 100.0 fL   MCH 32.1  26.0 - 34.0 pg   MCHC 35.4  30.0 - 36.0 g/dL   RDW 13.8  11.5 - 15.5 %   Platelets 248  150 - 400 K/uL   Neutrophils Relative % 61  43 - 77 %   Neutro Abs 5.6  1.7 - 7.7 K/uL   Lymphocytes Relative 30  12 - 46 %   Lymphs Abs 2.8  0.7 - 4.0 K/uL   Monocytes Relative 8  3 - 12 %   Monocytes Absolute 0.7  0.1 - 1.0 K/uL   Eosinophils Relative 1  0 - 5 %   Eosinophils Absolute 0.1  0.0 - 0.7 K/uL   Basophils Relative 0  0 - 1 %   Basophils Absolute 0.0  0.0 - 0.1 K/uL  URINALYSIS, ROUTINE W REFLEX MICROSCOPIC      Result Value Range   Color, Urine YELLOW  YELLOW   APPearance CLEAR  CLEAR   Specific Gravity, Urine 1.009  1.005 - 1.030   pH 6.5  5.0 - 8.0   Glucose, UA NEGATIVE  NEGATIVE mg/dL   Hgb urine dipstick LARGE (*) NEGATIVE   Bilirubin Urine NEGATIVE  NEGATIVE   Ketones, ur NEGATIVE  NEGATIVE mg/dL   Protein, ur NEGATIVE  NEGATIVE mg/dL   Urobilinogen, UA 0.2  0.0 - 1.0 mg/dL   Nitrite NEGATIVE  NEGATIVE   Leukocytes, UA MODERATE (*) NEGATIVE  URINE MICROSCOPIC-ADD ON      Result Value Range   Squamous Epithelial / LPF FEW (*) RARE   WBC, UA 11-20  <3 WBC/hpf   RBC / HPF 7-10  <3 RBC/hpf   Bacteria, UA FEW (*) RARE      EKG  Interpretation   None       MDM   1. UTI (lower urinary tract infection)     Patient with acute onset of abdominal pain and vomiting approximately one hour ago.  Labs are reassuring. Will give fluids, pain medicine, and Zofran. Will reassess. Doubt acute abdomen. Patient has had a hysterectomy. No new vaginal discharge or bleeding. No urinary complaints.  7:31 PM Discussed with Dr. Eulis Foster.  Will treat for UTI.  Abdomen remains benign on repeat exams.  Patietn is tolerating PO. Return precautions are discussed.  Patient is stable and ready for discharge.    Montine Circle, PA-C 09/14/13 1952

## 2013-09-16 ENCOUNTER — Emergency Department (HOSPITAL_COMMUNITY): Payer: Medicaid Other

## 2013-09-16 ENCOUNTER — Encounter (HOSPITAL_COMMUNITY): Payer: Self-pay | Admitting: Emergency Medicine

## 2013-09-16 ENCOUNTER — Emergency Department (HOSPITAL_COMMUNITY)
Admission: EM | Admit: 2013-09-16 | Discharge: 2013-09-17 | Disposition: A | Discharge status: Other Acute Inpatient Hospital | Payer: Medicaid Other | Attending: Emergency Medicine | Admitting: Emergency Medicine

## 2013-09-16 DIAGNOSIS — Z792 Long term (current) use of antibiotics: Secondary | ICD-10-CM | POA: Insufficient documentation

## 2013-09-16 DIAGNOSIS — F17200 Nicotine dependence, unspecified, uncomplicated: Secondary | ICD-10-CM | POA: Insufficient documentation

## 2013-09-16 DIAGNOSIS — R45851 Suicidal ideations: Secondary | ICD-10-CM | POA: Insufficient documentation

## 2013-09-16 DIAGNOSIS — R52 Pain, unspecified: Secondary | ICD-10-CM | POA: Insufficient documentation

## 2013-09-16 DIAGNOSIS — F430 Acute stress reaction: Secondary | ICD-10-CM | POA: Insufficient documentation

## 2013-09-16 DIAGNOSIS — Z8669 Personal history of other diseases of the nervous system and sense organs: Secondary | ICD-10-CM | POA: Insufficient documentation

## 2013-09-16 DIAGNOSIS — Z90710 Acquired absence of both cervix and uterus: Secondary | ICD-10-CM | POA: Insufficient documentation

## 2013-09-16 DIAGNOSIS — M545 Low back pain, unspecified: Secondary | ICD-10-CM | POA: Insufficient documentation

## 2013-09-16 DIAGNOSIS — N390 Urinary tract infection, site not specified: Secondary | ICD-10-CM | POA: Insufficient documentation

## 2013-09-16 DIAGNOSIS — J4489 Other specified chronic obstructive pulmonary disease: Secondary | ICD-10-CM | POA: Insufficient documentation

## 2013-09-16 DIAGNOSIS — Z86711 Personal history of pulmonary embolism: Secondary | ICD-10-CM | POA: Insufficient documentation

## 2013-09-16 DIAGNOSIS — J449 Chronic obstructive pulmonary disease, unspecified: Secondary | ICD-10-CM | POA: Insufficient documentation

## 2013-09-16 LAB — RAPID URINE DRUG SCREEN, HOSP PERFORMED
AMPHETAMINES: NOT DETECTED
Barbiturates: NOT DETECTED
Benzodiazepines: NOT DETECTED
Cocaine: NOT DETECTED
Opiates: NOT DETECTED
Tetrahydrocannabinol: POSITIVE — AB

## 2013-09-16 LAB — URINALYSIS, ROUTINE W REFLEX MICROSCOPIC
Bilirubin Urine: NEGATIVE
GLUCOSE, UA: NEGATIVE mg/dL
KETONES UR: 15 mg/dL — AB
Nitrite: NEGATIVE
Protein, ur: NEGATIVE mg/dL
Specific Gravity, Urine: 1.012 (ref 1.005–1.030)
Urobilinogen, UA: 0.2 mg/dL (ref 0.0–1.0)
pH: 7 (ref 5.0–8.0)

## 2013-09-16 LAB — CBC
HEMATOCRIT: 35.9 % — AB (ref 36.0–46.0)
HEMOGLOBIN: 13 g/dL (ref 12.0–15.0)
MCH: 32.5 pg (ref 26.0–34.0)
MCHC: 36.2 g/dL — AB (ref 30.0–36.0)
MCV: 89.8 fL (ref 78.0–100.0)
Platelets: 247 10*3/uL (ref 150–400)
RBC: 4 MIL/uL (ref 3.87–5.11)
RDW: 13.9 % (ref 11.5–15.5)
WBC: 15.3 10*3/uL — ABNORMAL HIGH (ref 4.0–10.5)

## 2013-09-16 LAB — COMPREHENSIVE METABOLIC PANEL
ALT: 8 U/L (ref 0–35)
AST: 13 U/L (ref 0–37)
Albumin: 3.9 g/dL (ref 3.5–5.2)
Alkaline Phosphatase: 86 U/L (ref 39–117)
BUN: 7 mg/dL (ref 6–23)
CALCIUM: 9.1 mg/dL (ref 8.4–10.5)
CO2: 26 mEq/L (ref 19–32)
CREATININE: 0.69 mg/dL (ref 0.50–1.10)
Chloride: 98 mEq/L (ref 96–112)
GFR calc non Af Amer: >90 mL/min (ref >90)
GLUCOSE: 87 mg/dL (ref 70–99)
Potassium: 3.2 mEq/L — ABNORMAL LOW (ref 3.7–5.3)
Sodium: 138 mEq/L (ref 137–147)
TOTAL PROTEIN: 8 g/dL (ref 6.0–8.3)
Total Bilirubin: 0.5 mg/dL (ref 0.3–1.2)

## 2013-09-16 LAB — URINE MICROSCOPIC-ADD ON

## 2013-09-16 LAB — ETHANOL

## 2013-09-16 IMAGING — CT CT ABD-PELV W/O CM
2 of 4 series · 17 of 46 positions shown, 19 images · non-contrast
Comparison: None.

CLINICAL DATA: Left flank pain.  Hematuria

CT ABDOMEN AND PELVIS WITHOUT CONTRAST
TECHNIQUE: Multidetector CT imaging of the abdomen and pelvis was performed
following the standard protocol without intravenous contrast.

[Series 2: routine · axial · 0.55mm/px · z∈[-3,+362]mm · 14 of 81 slices shown, 16 images]
[im 4/81  soft-tissue]
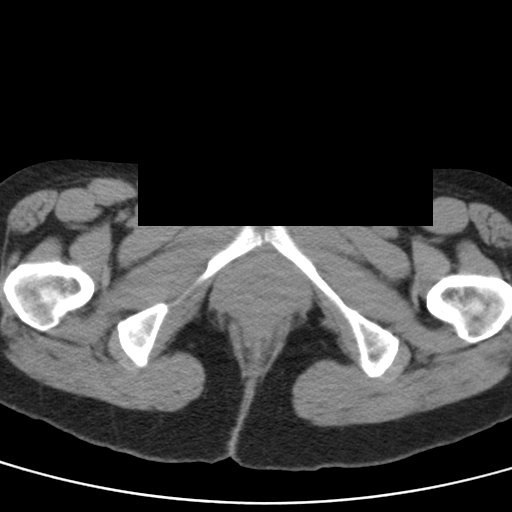
[im 4/81  bone]
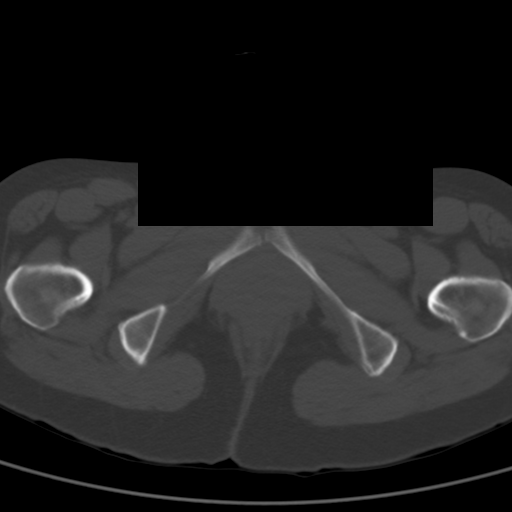
[im 10/81  soft-tissue]
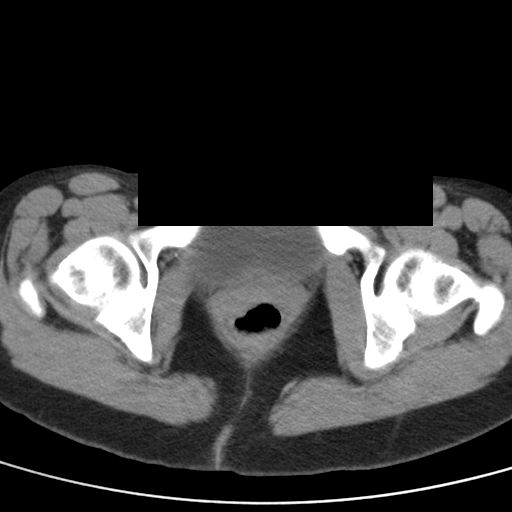
[im 17/81  soft-tissue]
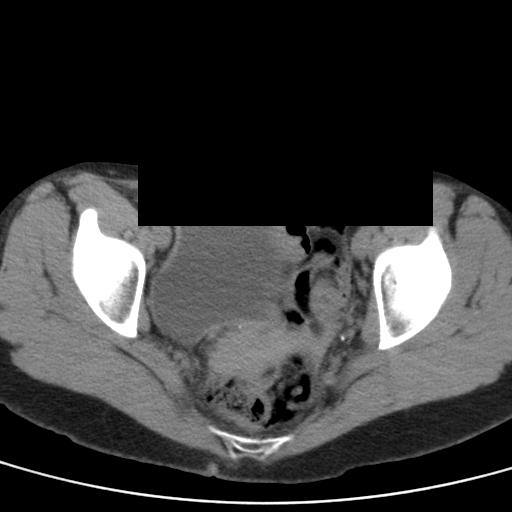
[im 23/81  soft-tissue]
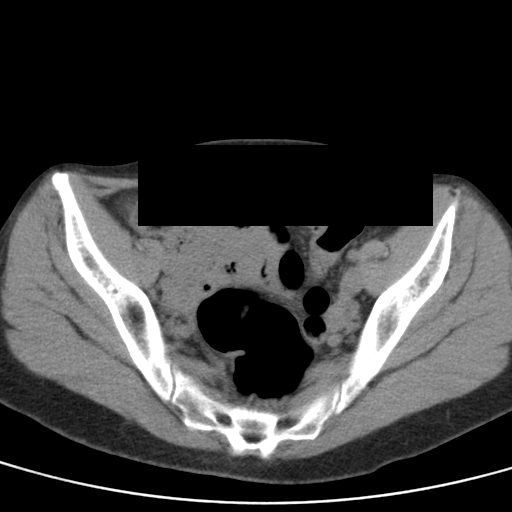
[im 26/81  soft-tissue]
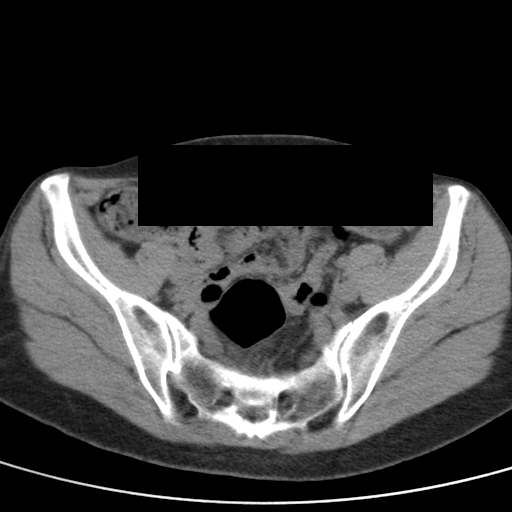
[im 33/81  soft-tissue]
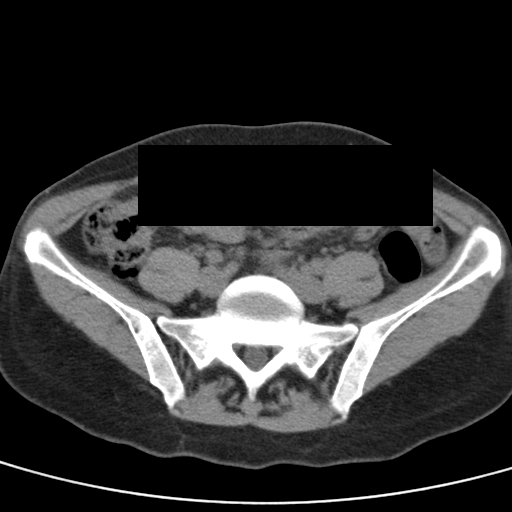
[im 39/81  soft-tissue]
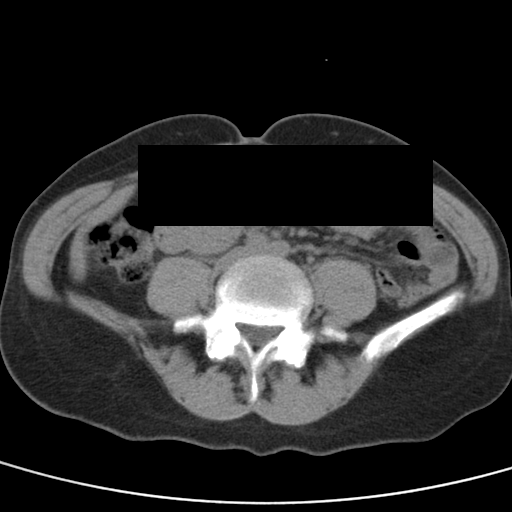
[im 42/81  soft-tissue]
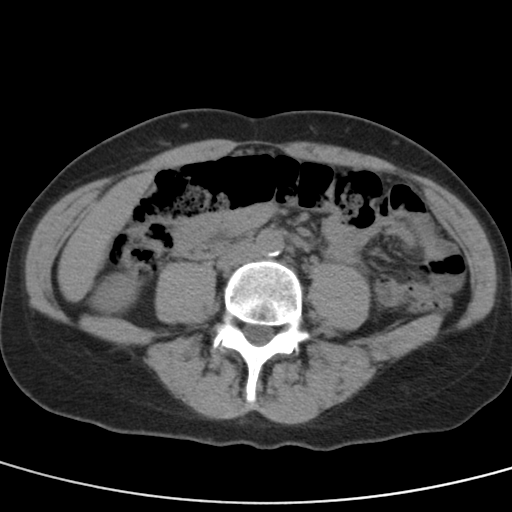
[im 49/81  soft-tissue]
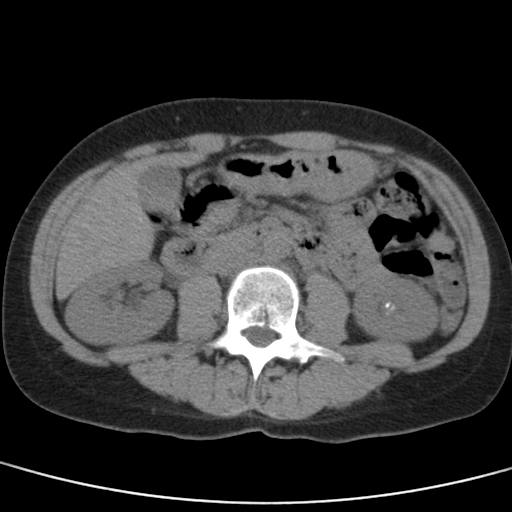
[im 49/81  bone]
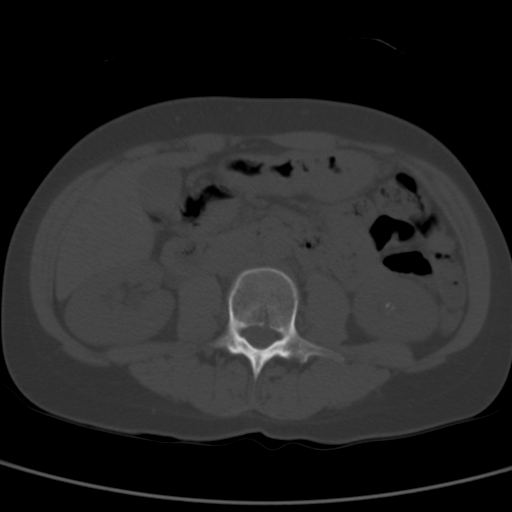
[im 55/81  soft-tissue]
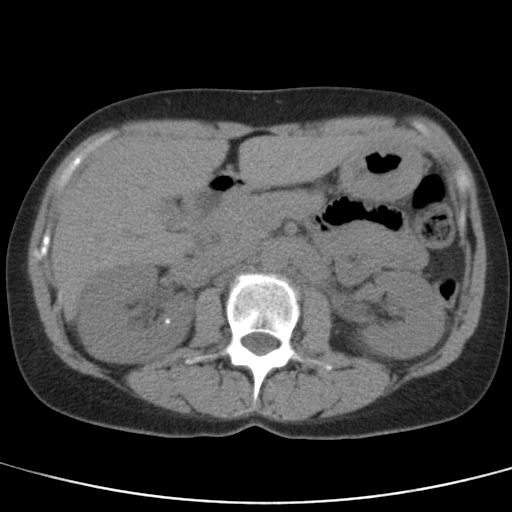
[im 61/81  soft-tissue]
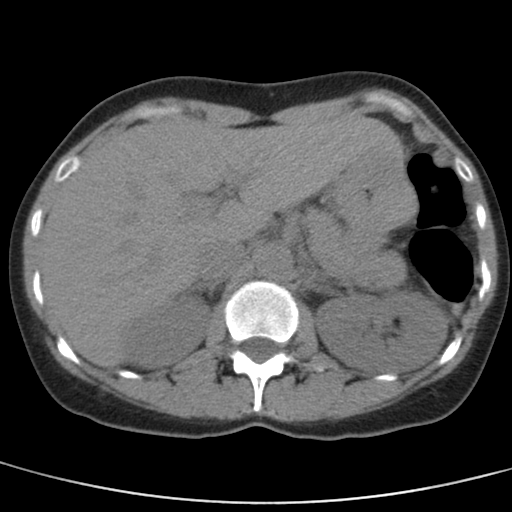
[im 65/81  soft-tissue]
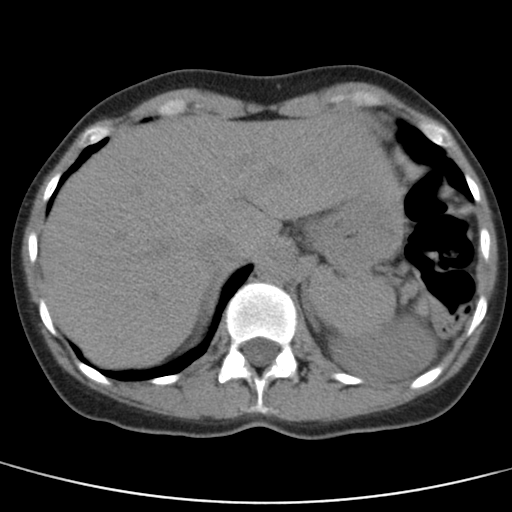
[im 71/81  soft-tissue]
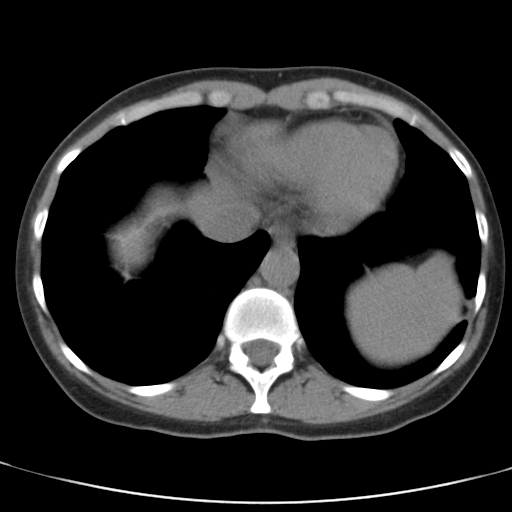
[im 77/81  soft-tissue]
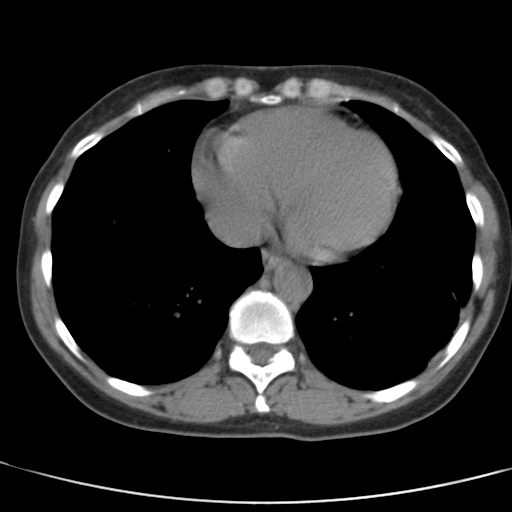

[coronal · coronal · 0.78mm/px · 3 of 77 slices shown]
[im 26/77  soft-tissue]
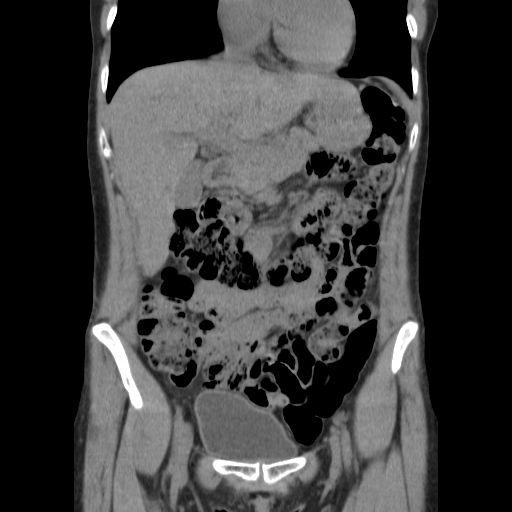
[im 34/77  soft-tissue]
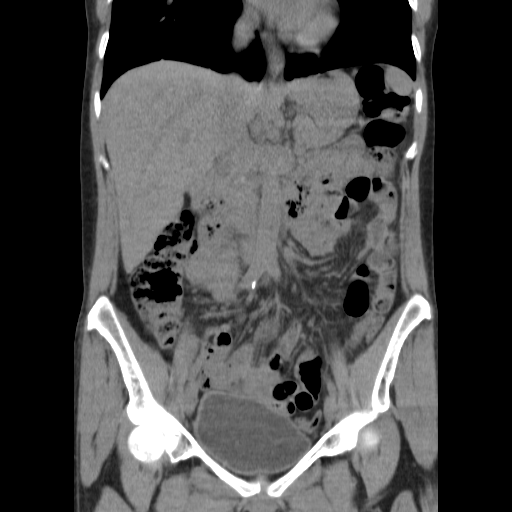
[im 43/77  soft-tissue]
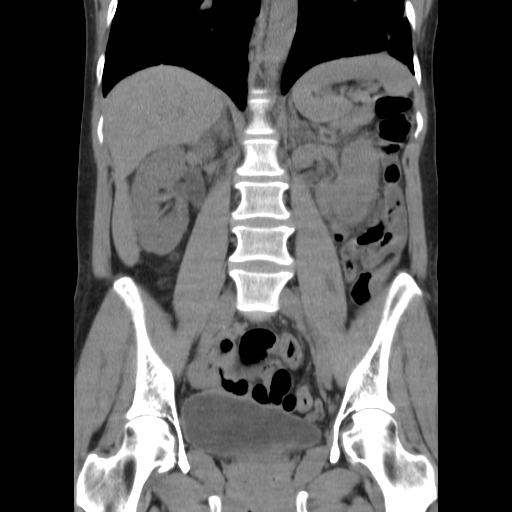

[17 of 46 positions shown; findings below may reference images not displayed]

FINDINGS: The liver is normal. The spleen is normal. Pancreas is normal. No
biliary or gallbladder distention.

Adrenals normal.Bilateral nonobstructing nephrolithiasis present.
Bilateral extrarenal pelves noted. There is no evidence of
hydroureter. Small punctate calcifications noted over the right
flank could represent nonobstructing right mid to ureteral stones.
Largest measures 3 mm. Innumerable punctate calcifications in the
pelvis consistent with phleboliths could represent nonobstructing
tiny distal ureteral stones bilaterally. Bladder is nondistended.
Again no hydronephrosis noted. Prior hysterectomy. No abnormal
pelvic fluid collections. No adnexal masses.

No significant and adenopathy.  Aorta normal caliber.

Appendix unremarkable. No inflammatory change in right or left lower
quadrant. No significant bowel distention. Mild gastric wall
thickening . This may be from lack of distension. A gastric process
such as gastritis cannot be excluded and clinical correlation
suggested. No free air.

Heart size normal. Mild atelectasis versus infiltrate left lung
base. No acute bony abnormality. Degenerative changes lumbar spine.
IMPRESSION: 1. Bilateral nonobstructing nephrolithiasis. Innumerable abdominal
and pelvic punctate calcifications are noted. Although these most
likely represent phleboliths, non obstructing ureteral stones cannot
be excluded. No hydronephrosis.
2. Gastric wall thickening cannot be excluded. A process such as
gastritis cannot be excluded. Clinical correlation suggested. No
bowel distention.
3. Mild atelectasis versus infiltrate left lung base.

## 2013-09-16 MED ORDER — LORAZEPAM 1 MG PO TABS: 1.0000 mg | ORAL_TABLET | Freq: Three times a day (TID) | ORAL | Status: DC | PRN

## 2013-09-16 MED ORDER — ZOLPIDEM TARTRATE 5 MG PO TABS: 5.0000 mg | ORAL_TABLET | Freq: Every evening | ORAL | Status: DC | PRN

## 2013-09-16 MED ORDER — CEPHALEXIN 250 MG PO CAPS
500.0000 mg | ORAL_CAPSULE | Freq: Two times a day (BID) | ORAL | Status: DC
  Administered 2013-09-17: 500 mg via ORAL
  Filled 2013-09-16: qty 2

## 2013-09-16 MED ORDER — ACETAMINOPHEN 325 MG PO TABS
650.0000 mg | ORAL_TABLET | ORAL | Status: DC | PRN
  Administered 2013-09-16 – 2013-09-17 (×2): 650 mg via ORAL
  Filled 2013-09-16 (×2): qty 2

## 2013-09-16 MED ORDER — CEPHALEXIN 250 MG PO CAPS
500.0000 mg | ORAL_CAPSULE | Freq: Once | ORAL | Status: AC
  Administered 2013-09-16: 500 mg via ORAL
  Filled 2013-09-16: qty 2

## 2013-09-16 MED ORDER — NICOTINE 21 MG/24HR TD PT24
21.0000 mg | MEDICATED_PATCH | Freq: Every day | TRANSDERMAL | Status: DC
  Administered 2013-09-16 – 2013-09-17 (×2): 21 mg via TRANSDERMAL
  Filled 2013-09-16 (×2): qty 1

## 2013-09-16 MED ORDER — IBUPROFEN 400 MG PO TABS
600.0000 mg | ORAL_TABLET | Freq: Three times a day (TID) | ORAL | Status: DC | PRN
  Administered 2013-09-17: 600 mg via ORAL
  Filled 2013-09-16 (×2): qty 1

## 2013-09-16 MED ORDER — IBUPROFEN 800 MG PO TABS
800.0000 mg | ORAL_TABLET | Freq: Once | ORAL | Status: AC
  Administered 2013-09-16: 800 mg via ORAL
  Filled 2013-09-16: qty 1

## 2013-09-16 MED ORDER — ONDANSETRON HCL 4 MG PO TABS: 4.0000 mg | ORAL_TABLET | Freq: Three times a day (TID) | ORAL | Status: DC | PRN

## 2013-09-16 MED ORDER — ALUM & MAG HYDROXIDE-SIMETH 200-200-20 MG/5ML PO SUSP
30.0000 mL | ORAL | Status: DC | PRN
Start: 2013-09-16 — End: 2013-09-17

## 2013-09-16 MED ORDER — CEPHALEXIN 250 MG PO CAPS: 500.0000 mg | ORAL_CAPSULE | Freq: Two times a day (BID) | ORAL | Status: DC

## 2013-09-16 MED ORDER — POTASSIUM CHLORIDE CRYS ER 20 MEQ PO TBCR
40.0000 meq | EXTENDED_RELEASE_TABLET | Freq: Once | ORAL | Status: AC
  Administered 2013-09-16: 40 meq via ORAL
  Filled 2013-09-16: qty 2

## 2013-09-16 NOTE — BH Assessment (Signed)
Clinician attempted to begin tele-assessment. Difficulties with sound on equipment.  Nursing staff will locate other machine and contact TTS to begin assessment shortly.   Fredonia Highland, MSW, LCSW Triage Specialist [phone]

## 2013-09-16 NOTE — ED Notes (Signed)
Patient transported to CT 

## 2013-09-16 NOTE — ED Notes (Signed)
Pt being interview by TTS

## 2013-09-16 NOTE — ED Notes (Signed)
AC called for notification of sitter need.
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
## 2013-09-16 NOTE — BH Assessment (Signed)
Clinician consulted with Serena Colonel NP who states Pt meets criteria for inpatient admission. Otila Kluver, [facility] reported no appropriate bed availability for pt. TTS will look for outside facilities. Clinician contacted Dr. Lahoma Rocker to provide NP's recommendation.  Fredonia Highland, MSW, LCSW Triage Specialist [phone]

## 2013-09-16 NOTE — ED Notes (Signed)
Pt here last night and was treated for an UTI, pt back today stating that she is hurting all over and also that she is depressed and has SI thoughts and plans of jumping off a bridge

## 2013-09-16 NOTE — ED Notes (Signed)
Pt belongings placed in 2 pt belonging bags and taken to nurses station.

## 2013-09-16 NOTE — ED Provider Notes (Signed)
CSN: [id number]     Arrival date & time 09/16/13  1512 History   First MD Initiated Contact with Patient 09/16/13 1613     Chief Complaint  Patient presents with  . Depression   (Consider location/radiation/quality/duration/timing/severity/associated sxs/prior Treatment) HPI Pt presents with c/o pain all over her body and feeling that she is depressed.  She is feeling that she will kill herself and plans to jump off a bridge.  She was seen 2 days ago in the ED and was found to have UTI.  Was started on keflex.  Pt states she has not been able to get keflex filled due to money concerns.  She c/o pain in left lower back and states she hurts all over her body.  No fever/chills, no vomiting.  Denies drug or alcohol use.  States she has had increased stress in her life. There are no other associated systemic symptoms, there are no other alleviating or modifying factors.   Past Medical History  Diagnosis Date  . Anxiety   . Depression   . Insomnia   . Weight loss     due to anxiety  . PE (pulmonary embolism) 2006  . Alcohol abuse   . Polysubstance abuse     Current smoking and alcohol. 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 Marijuna not taking since 7 month.   Marland Kitchen COPD (chronic obstructive pulmonary disease)    Past Surgical History  Procedure Laterality Date  . Abdominal hysterectomy     Family History  Problem Relation Age of Onset  . Coronary artery disease Sister   . Heart disease Mother   . Hyperlipidemia Mother   . Hypertension Mother   . Aneurysm Brother 58  . Aneurysm Paternal Aunt 46    Died   . Heart disease Maternal Grandmother   . Pulmonary embolism Brother    History  Substance Use Topics  . Smoking status: Current Every Day Smoker -- 0.50 packs/day for 31 years    Types: Cigarettes  . Smokeless tobacco: Never Used     Comment: TO LET DOCTOR KNOW WHEN READY TO QUIT.  Marland Kitchen Alcohol Use: Yes     Comment: 2 beers a week   OB History   Grav Para Term  Preterm Abortions TAB SAB Ect Mult Living                 Review of Systems ROS reviewed and all otherwise negative except for mentioned in HPI  Allergies  Review of patient's allergies indicates no known allergies.  Home Medications   Current Outpatient Rx  Name  Route  Sig  Dispense  Refill  . cephALEXin (KEFLEX) 500 MG capsule   Oral   Take 1 capsule (500 mg total) by mouth 4 (four) times daily.   40 capsule   0    BP 102/67  Pulse 80  Temp(Src) 99.8 F (37.7 C) (Oral)  Resp 12  Ht 5\' 4"  (1.626 m)  Wt 115 lb (52.164 kg)  BMI 19.73 kg/m2  SpO2 96%  LMP 01/26/2004 Vitals reviewed Physical Exam Physical Examination: General appearance - alert, well appearing, and in no distress Mental status - alert, oriented to person, place, and time Eyes - no conjunctival injection, no scleral icterus Mouth - mucous membranes moist, pharynx normal without lesions Chest - clear to auscultation, no wheezes, rales or rhonchi, symmetric air entry Heart - normal rate, regular rhythm, normal S1, S2, no murmurs, rubs, clicks or gallops Abdomen - soft,  nontender, nondistended, no masses or organomegaly, nabs Back exam - full range of motion,no midline tenderness, no CVA tenderness Extremities - peripheral pulses normal, no pedal edema, no clubbing or cyanosis Skin - normal coloration and turgor, no rashes Psych- tearful, answering questions appropriately  ED Course  Procedures (including critical care time) Labs Review Labs Reviewed  CBC - Abnormal; Notable for the following:    WBC 15.3 (*)    HCT 35.9 (*)    MCHC 36.2 (*)    All other components within normal limits  COMPREHENSIVE METABOLIC PANEL - Abnormal; Notable for the following:    Potassium 3.2 (*)    All other components within normal limits  URINALYSIS, ROUTINE W REFLEX MICROSCOPIC - Abnormal; Notable for the following:    APPearance CLOUDY (*)    Hgb urine dipstick TRACE (*)    Ketones, ur 15 (*)    Leukocytes, UA  SMALL (*)    All other components within normal limits  URINE RAPID DRUG SCREEN (HOSP PERFORMED) - Abnormal; Notable for the following:    Tetrahydrocannabinol POSITIVE (*)    All other components within normal limits  URINE MICROSCOPIC-ADD ON - Abnormal; Notable for the following:    Squamous Epithelial / LPF MANY (*)    All other components within normal limits  ETHANOL   Imaging Review Ct Abdomen Pelvis Wo Contrast  09/16/2013   CLINICAL DATA:  Left flank pain.  Hematuria  CT ABDOMEN AND PELVIS WITHOUT CONTRAST  TECHNIQUE: Multidetector CT imaging of the abdomen and pelvis was performed following the standard protocol without intravenous contrast.  COMPARISON:  None.  FINDINGS: The liver is normal. The spleen is normal. Pancreas is normal. No biliary or gallbladder distention.  Adrenals normal.Bilateral nonobstructing nephrolithiasis present. Bilateral extrarenal pelves noted. There is no evidence of hydroureter. Small punctate calcifications noted over the right flank could represent nonobstructing right mid to ureteral stones. Largest measures 3 mm. Innumerable punctate calcifications in the pelvis consistent with phleboliths could represent nonobstructing tiny distal ureteral stones bilaterally. Bladder is nondistended. Again no hydronephrosis noted. Prior hysterectomy. No abnormal pelvic fluid collections. No adnexal masses.  No significant and adenopathy.  Aorta normal caliber.  Appendix unremarkable. No inflammatory change in right or left lower quadrant. No significant bowel distention. Mild gastric wall thickening . This may be from lack of distension. A gastric process such as gastritis cannot be excluded and clinical correlation suggested. No free air.  Heart size normal. Mild atelectasis versus infiltrate left lung base. No acute bony abnormality. Degenerative changes lumbar spine.  IMPRESSION: 1. Bilateral nonobstructing nephrolithiasis. Innumerable abdominal and pelvic punctate  calcifications are noted. Although these most likely represent phleboliths, non obstructing ureteral stones cannot be excluded. No hydronephrosis. 2. Gastric wall thickening cannot be excluded. A process such as gastritis cannot be excluded. Clinical correlation suggested. No bowel distention. 3. Mild atelectasis versus infiltrate left lung base.   Electronically Signed   By: Marcello Moores  Register   On: 09/16/2013 18:46    EKG Interpretation   None       MDM   1. Suicidal ideation   2. Urinary tract infection    Pt presenting with diffuse body pain and suicidal ideations.  Pt was diagnosed 2 days ago with UTI but has not started on antibitoics.  Pt was given keflex here and ibuprofen for pain which did help somewhat.  Nonobstructive renal stones on CT scan.  Sitter at bedside.  Pt will need 7 day course of keflex.  Per chart  review, urine culture shows > 100K Ecoli- sensitivities are pending. D/w psych and she will be assessed for her SI/depression.     Threasa Beards, MD 09/17/13 [phone]

## 2013-09-16 NOTE — BH Assessment (Signed)
Clinician consulted with Dr. Canary Brim to gather more information about Pt.  Contacted NT to set up tele-assessment. Tele-assessment will be initiated.   Fredonia Highland, MSW, LCSW Triage Specialist [phone]

## 2013-09-16 NOTE — ED Notes (Signed)
Security paged. 

## 2013-09-17 ENCOUNTER — Encounter (HOSPITAL_COMMUNITY): Payer: Self-pay

## 2013-09-17 ENCOUNTER — Inpatient Hospital Stay (HOSPITAL_COMMUNITY)
Admission: AD | Admit: 2013-09-17 | Discharge: 2013-09-20 | DRG: 885 | Disposition: A | Discharge status: Home / Self Care | Payer: Medicaid Other | Source: Intra-hospital | Attending: Psychiatry | Admitting: Psychiatry

## 2013-09-17 ENCOUNTER — Other Ambulatory Visit: Payer: Self-pay

## 2013-09-17 ENCOUNTER — Encounter (HOSPITAL_COMMUNITY): Payer: Self-pay | Admitting: Emergency Medicine

## 2013-09-17 DIAGNOSIS — J449 Chronic obstructive pulmonary disease, unspecified: Secondary | ICD-10-CM | POA: Diagnosis present

## 2013-09-17 DIAGNOSIS — R45851 Suicidal ideations: Secondary | ICD-10-CM

## 2013-09-17 DIAGNOSIS — F411 Generalized anxiety disorder: Secondary | ICD-10-CM | POA: Diagnosis present

## 2013-09-17 DIAGNOSIS — F329 Major depressive disorder, single episode, unspecified: Secondary | ICD-10-CM | POA: Diagnosis present

## 2013-09-17 DIAGNOSIS — F3130 Bipolar disorder, current episode depressed, mild or moderate severity, unspecified: Secondary | ICD-10-CM

## 2013-09-17 DIAGNOSIS — J4489 Other specified chronic obstructive pulmonary disease: Secondary | ICD-10-CM | POA: Diagnosis present

## 2013-09-17 DIAGNOSIS — G4700 Insomnia, unspecified: Secondary | ICD-10-CM | POA: Diagnosis present

## 2013-09-17 DIAGNOSIS — F332 Major depressive disorder, recurrent severe without psychotic features: Principal | ICD-10-CM | POA: Diagnosis present

## 2013-09-17 DIAGNOSIS — Z79899 Other long term (current) drug therapy: Secondary | ICD-10-CM

## 2013-09-17 DIAGNOSIS — F17200 Nicotine dependence, unspecified, uncomplicated: Secondary | ICD-10-CM | POA: Diagnosis present

## 2013-09-17 LAB — URINE CULTURE

## 2013-09-17 LAB — PROTIME-INR
INR: 1.01 (ref 0.00–1.49)
PROTHROMBIN TIME: 13.1 s (ref 11.6–15.2)

## 2013-09-17 MED ORDER — ALUM & MAG HYDROXIDE-SIMETH 200-200-20 MG/5ML PO SUSP: 30.0000 mL | ORAL | Status: DC | PRN

## 2013-09-17 MED ORDER — MIRTAZAPINE 15 MG PO TABS
15.0000 mg | ORAL_TABLET | Freq: Every day | ORAL | Status: DC
  Administered 2013-09-17 – 2013-09-19 (×3): 15 mg via ORAL
  Filled 2013-09-17 (×2): qty 1
  Filled 2013-09-17: qty 14
  Filled 2013-09-17 (×4): qty 1

## 2013-09-17 MED ORDER — ARIPIPRAZOLE 5 MG PO TABS
5.0000 mg | ORAL_TABLET | Freq: Every day | ORAL | Status: DC
  Administered 2013-09-17: 5 mg via ORAL
  Filled 2013-09-17: qty 1

## 2013-09-17 MED ORDER — CEPHALEXIN 500 MG PO CAPS
500.0000 mg | ORAL_CAPSULE | Freq: Four times a day (QID) | ORAL | Status: DC
  Administered 2013-09-17 – 2013-09-20 (×11): 500 mg via ORAL
  Filled 2013-09-17: qty 16
  Filled 2013-09-17: qty 1
  Filled 2013-09-17: qty 16
  Filled 2013-09-17 (×4): qty 1
  Filled 2013-09-17: qty 16
  Filled 2013-09-17 (×6): qty 1
  Filled 2013-09-17: qty 2
  Filled 2013-09-17 (×4): qty 1
  Filled 2013-09-17: qty 16
  Filled 2013-09-17 (×3): qty 1

## 2013-09-17 MED ORDER — WARFARIN SODIUM 5 MG PO TABS: 5.0000 mg | ORAL_TABLET | Freq: Every day | ORAL | Status: DC

## 2013-09-17 MED ORDER — WARFARIN SODIUM 5 MG PO TABS
5.0000 mg | ORAL_TABLET | Freq: Every day | ORAL | Status: DC
  Filled 2013-09-17: qty 1

## 2013-09-17 MED ORDER — MIRTAZAPINE 15 MG PO TABS
15.0000 mg | ORAL_TABLET | Freq: Every day | ORAL | Status: DC
  Filled 2013-09-17: qty 1

## 2013-09-17 MED ORDER — MAGNESIUM HYDROXIDE 400 MG/5ML PO SUSP: 30.0000 mL | Freq: Every day | ORAL | Status: DC | PRN

## 2013-09-17 MED ORDER — IBUPROFEN 600 MG PO TABS
600.0000 mg | ORAL_TABLET | Freq: Four times a day (QID) | ORAL | Status: DC | PRN
  Administered 2013-09-17 – 2013-09-18 (×3): 600 mg via ORAL
  Filled 2013-09-17 (×3): qty 1

## 2013-09-17 MED ORDER — HYDROXYZINE HCL 25 MG PO TABS: 25.0000 mg | ORAL_TABLET | Freq: Four times a day (QID) | ORAL | Status: DC | PRN

## 2013-09-17 MED ORDER — ARIPIPRAZOLE 5 MG PO TABS
5.0000 mg | ORAL_TABLET | Freq: Every day | ORAL | Status: DC
  Administered 2013-09-18 – 2013-09-20 (×3): 5 mg via ORAL
  Filled 2013-09-17 (×3): qty 1
  Filled 2013-09-17: qty 14
  Filled 2013-09-17 (×3): qty 1

## 2013-09-17 MED ORDER — ACETAMINOPHEN 325 MG PO TABS: 650.0000 mg | ORAL_TABLET | Freq: Four times a day (QID) | ORAL | Status: DC | PRN

## 2013-09-17 NOTE — ED Notes (Signed)
IN TO TALK WITH PT. PT ADVISES DUE TO HER MOVING OUT OF STATE FOR A FEW MONTHS HER MEDICAID HAS NOT GOTTEN BACK TO [place]. STATES SHE HAS HER PRESCRIPTIONS AT THE PHARMACY BUT CANNOT AFFORD TO PICK THEM UP. PT STATES IF SHE HAD HER MEDS SHE WOULD TAKE THEM. NOTIFIED CASE MANAGER AND SHE WILL SEE PT. PT STATES HER DEPRESSION HAS BEEN AT ITS WORST THE PAST 2-3 MONTHS

## 2013-09-17 NOTE — ED Notes (Signed)
Patient at desk on the phone. She is tearful.

## 2013-09-17 NOTE — Tx Team (Signed)
Initial Interdisciplinary Treatment Plan  PATIENT STRENGTHS: (choose at least two) Communication skills Motivation for treatment/growth  PATIENT STRESSORS: Financial difficulties Health problems Medication change or noncompliance Substance abuse   PROBLEM LIST: Problem List/Patient Goals Date to be addressed Date deferred Reason deferred Estimated date of resolution  Depresion      Poor coping skills      Better support                                           DISCHARGE CRITERIA:  Ability to meet basic life and health needs Adequate post-discharge living arrangements Improved stabilization in mood, thinking, and/or behavior Medical problems require only outpatient monitoring  PRELIMINARY DISCHARGE PLAN: Attend aftercare/continuing care group Outpatient therapy Return to previous living arrangement  PATIENT/FAMIILY INVOLVEMENT: This treatment plan has been presented to and reviewed with the patient, Emily Cameron.  The patient and family have been given the opportunity to ask questions and make suggestions.  Mindi Slicker M 09/17/2013, 6:29 PM

## 2013-09-17 NOTE — BH Assessment (Signed)
Per Okey Regal, [facility] at [facility], adult unit is at capacity. Contacted the following facilities for placement:  [facility]: At capacity [facility]: At [place]: At [place]: At [place]: At [place]: At [place]: At [facility]: At [place]: At [place]: At [place]: At [place]: At [facility]: Bed available. Faxed clinical information. [place]: Bed available. Faxed clinical information.   Orpah Greek Rosana Hoes, [facility] Triage Specialist

## 2013-09-17 NOTE — Progress Notes (Signed)
B.Hazem Kenner, MHT received report from [place] at [facility] who reports no [place] beds at this time but will hold for further review when [place] beds comes available.
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
## 2013-09-17 NOTE — ED Notes (Signed)
PT HAS BEEN ACCEPTED AT [facility]. SHE IS AGREEABLE

## 2013-09-17 NOTE — Progress Notes (Signed)
Emily Cameron, Emily Cameron received report from Alfonzo Beers, [facility] that patient has been accepted to [facility] assigned to Bed 501-1 accepted by Dr. Inez Pilgrim. Writer completed support paper work with patient and informed of disposition. Support paper work has been faxed to [facility] and copies provided to attending nurse who will arrange for transfer via [place] transportation.

## 2013-09-17 NOTE — ED Notes (Signed)
Pt resting/sleeping intermittently, restless, repositioning self, NAD, calm, visualized on CCTV monitor, sitter present, security rounding.

## 2013-09-17 NOTE — ED Notes (Signed)
[facility] called to transport pt to [facility]

## 2013-09-17 NOTE — Progress Notes (Signed)
B.Shanieka Blea, MHT completed placement search by contacting the following facilities in efforts to secure placement;   Alyssa Grove at [place] faxed for review [facility] faxed for review no beds appropriate for this patient at this time [facility] faxed for review [facility] faxed for review [facility] faxed for review [facility] faxed for review

## 2013-09-17 NOTE — Progress Notes (Signed)
D. Pt has been in room since arrival to unit. Pt has not participated in any milieu activities this evening. Pt did make complaints of lower abdominal pain, insomnia and depression this evening. Pt is being treated for UTI but does not state that she is having any symptoms related to it. Pt did receive all medications without incident. A. Education given in regards to UTI and various medications. R. Pt verbalized understanding, safety maintained, will continue to monitor.

## 2013-09-17 NOTE — Progress Notes (Signed)
Didn't attend group 

## 2013-09-17 NOTE — ED Provider Notes (Signed)
Pt up walking around, she is in no distress Accepted to [facility] BP 107/61  Pulse 85  Temp(Src) 98.6 F (37 C) (Oral)  Resp 16  Ht 5\' 4"  (1.626 m)  Wt 115 lb (52.164 kg)  BMI 19.73 kg/m2  SpO2 100%  LMP 01/26/2004 Labs reviewed Stable for transfer  Sharyon Cable, MD 09/17/13 [phone]

## 2013-09-17 NOTE — Progress Notes (Signed)
PHARMACIST - PHYSICIAN COMMUNICATION CONCERNING: Pharmacy Care Issues Regarding Warfarin Labs  RECOMMENDATION (Action Taken): A daily protime for three days has been ordered to meet the [facility] National Patient safety goal and comply with the current [place].   The Pharmacy will defer all warfarin dose order changes and follow up of lab results to the prescriber unless an additional order to initiate a "pharmacy Coumadin consult" is placed.  DESCRIPTION:  While hospitalized, to be in compliance with [facility] Patient Safety Goals, all patients on warfarin must have a baseline and/or current protime prior to the administration of warfarin. Pharmacy has received your order for warfarin without these required laboratory assessments.  Hershal Coria, PharmD, BCPS Pager: [phone] 09/17/2013 7:21 PM

## 2013-09-17 NOTE — Progress Notes (Signed)
ED CM consulted by [facility] RN on Pod C in regards to possible medication assistance. Pt presented to [facility] ED with SI, and UTI.  In room to speak with patient sitter at bedside. Patient states, her Medicaid still has not been transferred from [facility] as yet. She reports not being able to get her medications. Pt is being transferred to [facility] [address] hall.  CM will continue to f/u with  Medications upon discharge. No further ED CM needs identified.

## 2013-09-17 NOTE — ED Notes (Signed)
Faxed ecg to [facility].

## 2013-09-18 DIAGNOSIS — F332 Major depressive disorder, recurrent severe without psychotic features: Principal | ICD-10-CM

## 2013-09-18 LAB — PROTIME-INR
INR: 0.97 (ref 0.00–1.49)
Prothrombin Time: 12.7 seconds (ref 11.6–15.2)

## 2013-09-18 MED ORDER — WARFARIN - PHARMACIST DOSING INPATIENT
Freq: Every day | Status: DC
  Filled 2013-09-18 (×16): qty 1

## 2013-09-18 MED ORDER — TRAZODONE HCL 50 MG PO TABS
50.0000 mg | ORAL_TABLET | Freq: Every evening | ORAL | Status: DC | PRN
  Administered 2013-09-18 (×2): 50 mg via ORAL
  Filled 2013-09-18 (×7): qty 1

## 2013-09-18 MED ORDER — NICOTINE 14 MG/24HR TD PT24
14.0000 mg | MEDICATED_PATCH | Freq: Every day | TRANSDERMAL | Status: DC
  Administered 2013-09-18 – 2013-09-20 (×3): 14 mg via TRANSDERMAL
  Filled 2013-09-18 (×5): qty 1

## 2013-09-18 MED ORDER — WARFARIN SODIUM 10 MG PO TABS
10.0000 mg | ORAL_TABLET | Freq: Once | ORAL | Status: AC
  Administered 2013-09-18: 10 mg via ORAL
  Filled 2013-09-18: qty 1

## 2013-09-18 NOTE — BHH Counselor (Signed)
Adult Psychosocial Assessment Update Interdisciplinary Team  Previous [facility] admissions/discharges:  Admissions Discharges  Date: 05/01/13 Date: 05/04/13  Date: Date:  Date: Date:  Date: Date:  Date: Date:   Changes since the last Psychosocial Assessment (including adherence to outpatient mental health and/or substance abuse treatment, situational issues contributing to decompensation and/or relapse). Pt was admitted to the hospital for depression and SI.  Pt reports current stressors being conflict with her daughter.  Pt states that since last admission she did go to [facility] but didn't stay long and was evicted from that home, and came back to [place].  Pt states that she's been staying with her son here.  Pt didn't follow up with [facility] in [facility] and has not been compliant with meds.  Pt states that she has been working on getting Section 8 housing here in [place] as well as her Medicaid back in place in [place].  Pt reports occasional alcohol and marijuana use.               Discharge Plan 1. Will you be returning to the same living situation after discharge?   Yes: X Pt will return to her son's home in [place] at discharge.   No:      If no, what is your plan?           2. Would you like a referral for services when you are discharged? Yes:  X   If yes, for what services? CSW will refer pt to [facility] for outpatient medication management and therapy.    No:              Summary and Recommendations (to be completed by the evaluator) Patient is a 53 year old African American female with a diagnosis of Major Depression, Recurrent severe.  Patient lives in [place] with her son.  Patient will benefit from crisis stabilization, medication evaluation, group therapy and psycho education in addition to case management for discharge planning.                         Signature:  Ane Payment, 09/18/2013 9:36 AM

## 2013-09-18 NOTE — BHH Group Notes (Signed)
[place] LCSW Group Therapy  09/18/2013  1:15 PM   Type of Therapy:  Group Therapy  Participation Level:  Active  Participation Quality:  Attentive, Sharing and Supportive  Affect:  Depressed and Flat  Cognitive:  Alert and Oriented  Insight:  Developing/Improving and Engaged  Engagement in Therapy:  Developing/Improving and Engaged  Modes of Intervention:  Activity, Clarification, Confrontation, Discussion, Education, Exploration, Limit-setting, Orientation, Problem-solving, Rapport Building, Art therapist, Socialization and Support  Summary of Progress/Problems: Patient was attentive and engaged with speaker from [place].  Patient was attentive to speaker while they shared their story of dealing with mental health and overcoming it.  Patient expressed interest in their programs and services and received information on their agency.  Patient processed ways they can relate to the speaker.     Regan Lemming, LCSW 09/18/2013 1:26 PM

## 2013-09-18 NOTE — Progress Notes (Signed)
Adult Psychoeducational Group Note  Date:  09/18/2013 Time:  8:00 pm  Group Topic/Focus:  Wrap-Up Group:   The focus of this group is to help patients review their daily goal of treatment and discuss progress on daily workbooks.  Participation Level:  Active  Participation Quality:  Appropriate, Sharing and Supportive  Affect:  Appropriate  Cognitive:  Appropriate  Insight: Appropriate  Engagement in Group:  Engaged  Modes of Intervention:  Discussion, Education, Socialization and Support  Additional Comments:  Pt stated that she has learned to not judge others. Pt stated she is people person and she loves animals.   Piech, Zandyr Barnhill 09/18/2013, 9:51 PM

## 2013-09-18 NOTE — Progress Notes (Signed)
D: pt stated that today was better than last night. Pt is calm and cooperative. Appropriate to situation. Pt stated that she did not sleep well last night. PA notified, trazodone ordered. Denies si/hi/avh. Denies pain A: q 15 min safety checks R: pt remains safe on unit

## 2013-09-18 NOTE — Progress Notes (Signed)
Patient ID: Emily Cameron, female   DOB: 19-Mar-1961, 53 y.o.   MRN: [id number] She has been up and to groups interacting with peers and staff. Self inventory: depression 2, hopelessness 3, denies SI thoughts and c/o Headache and UTI symptoms. She is on a ABT medication.  She was upset this AM because her clothes were misplaced but clothes were given to her this AM.

## 2013-09-18 NOTE — Progress Notes (Signed)
Patient in bed awake at the beginning of this shift at 2300. Writer inquired if patient needed her repeat dose of Trazodone. She said yes and came to the medication window for second Trazone. Patient received medication without any problem or difficult and returned to her room. Q 15 minute check continues as ordered to maintain safety.

## 2013-09-18 NOTE — Progress Notes (Signed)
ANTICOAGULATION CONSULT NOTE - Initial Consult  Pharmacy Consult for Coumadin Indication: H/o PE  No Known Allergies  Patient Measurements: Height: 5\' 3"  (160 cm) Weight: 113 lb (51.256 kg) IBW/kg (Calculated) : 52.4   Vital Signs: Temp: 98.1 F (36.7 C) (01/27 0700) Temp src: Oral (01/27 0700) BP: 109/72 mmHg (01/27 0701) Pulse Rate: 98 (01/27 0701)  Labs:  Recent Labs  09/16/13 1621 09/17/13 1525 09/18/13 0625  HGB 13.0  --   --   HCT 35.9*  --   --   PLT 247  --   --   LABPROT  --  13.1 12.7  INR  --  1.01 0.97  CREATININE 0.69  --   --     Estimated Creatinine Clearance: 68.1 ml/min (by C-G formula based on Cr of 0.69).   Medical History: Past Medical History  Diagnosis Date  . Anxiety   . Depression   . Insomnia   . Weight loss     due to anxiety  . PE (pulmonary embolism) 2006  . Alcohol abuse   . Polysubstance abuse     Current smoking and alcohol. 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 Marijuna not taking since 7 month.   Marland Kitchen COPD (chronic obstructive pulmonary disease)     Medications:  Prescriptions prior to admission  Medication Sig Dispense Refill  . ARIPiprazole (ABILIFY) 5 MG tablet Take 5 mg by mouth daily.      . cephALEXin (KEFLEX) 500 MG capsule Take 1 capsule (500 mg total) by mouth 4 (four) times daily.  40 capsule  0  . mirtazapine (REMERON) 15 MG tablet Take 15 mg by mouth at bedtime.      Marland Kitchen warfarin (COUMADIN) 5 MG tablet Take 5 mg by mouth daily.        Assessment: INR below goal of 2-3 due to non compliance with medications.  Patient has not taken coumadin for over a year with the exception of hospitalizations and periodic doses from a sister.  She has not been managed by a internal medicaine physician or coumadin clinic recently although refers to one. Patient addiment that she need coumadin because she is going to die without it.   Goal of Therapy:  INR 2-3    Plan:  Coumadin 10 mg x 1 today.  Will give that  in am today as patient insist that we start as soon as possible as she needs this medicine.  Will draw PT/INR in AM.  Spoke with patient and she understands the importance of compliance.   Lenox Ponds 09/18/2013,8:27 AM
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
## 2013-09-18 NOTE — Progress Notes (Signed)
The focus of this group is to educate the patient on the purpose and policies of crisis stabilization and provide a format to answer questions about their admission.  The group details unit policies and expectations of patients while admitted.Did not attend group.  New to unit and seeing Education officer, museum.
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
## 2013-09-18 NOTE — Progress Notes (Signed)
Recreation Therapy Notes  Animal-Assisted Activity/Therapy (AAA/T) Program Checklist/Progress Notes Patient Eligibility Criteria Checklist & Daily Group note for Rec Tx Intervention  Date: 01.27.2015 Time: 2:45pm Location: [address]    AAA/T Program Assumption of Risk Form signed by Patient/ or Parent Legal Guardian yes  Patient is free of allergies or sever asthma yes  Patient reports no fear of animals yes  Patient reports no history of cruelty to animals yes   Patient understands his/her participation is voluntary yes  Patient washes hands before animal contact yes  Patient washes hands after animal contact yes  Behavioral Response: Engaged, Appropriate   Education: Hand Washing, Appropriate Animal Interaction   Education Outcome: Acknowledges understanding   Clinical Observations/Feedback: Patient interacted appropriately with therapeutic dog team, peers and LRT.   Laureen Ochs Cristen Bredeson, LRT/CTRS  Lane Hacker 09/18/2013 5:37 PM

## 2013-09-19 DIAGNOSIS — F411 Generalized anxiety disorder: Secondary | ICD-10-CM

## 2013-09-19 DIAGNOSIS — F1910 Other psychoactive substance abuse, uncomplicated: Secondary | ICD-10-CM

## 2013-09-19 LAB — PROTIME-INR
INR: 1.14 (ref 0.00–1.49)
Prothrombin Time: 14.4 seconds (ref 11.6–15.2)

## 2013-09-19 MED ORDER — WARFARIN SODIUM 10 MG PO TABS
10.0000 mg | ORAL_TABLET | Freq: Once | ORAL | Status: AC
  Administered 2013-09-19: 10 mg via ORAL
  Filled 2013-09-19: qty 1

## 2013-09-19 MED ORDER — TRAZODONE HCL 100 MG PO TABS
100.0000 mg | ORAL_TABLET | Freq: Every evening | ORAL | Status: DC | PRN
  Administered 2013-09-19: 100 mg via ORAL
  Filled 2013-09-19: qty 14

## 2013-09-19 MED ORDER — TRAZODONE HCL 100 MG PO TABS: 100.0000 mg | ORAL_TABLET | Freq: Every evening | ORAL | Status: DC | PRN

## 2013-09-19 NOTE — Progress Notes (Deleted)
Adult Psychoeducational Group Note  Date:  09/19/2013 Time:  8:31 PM  Group Topic/Focus:  Goals Group:   The focus of this group is to help patients establish daily goals to achieve during treatment and discuss how the patient can incorporate goal setting into their daily lives to aide in recovery.  Participation Level:  Active  Participation Quality:  Appropriate  Affect:  Appropriate  Cognitive:  Appropriate  Insight: Appropriate  Engagement in Group:  Engaged  Modes of Intervention:  Support  Additional Comments:  Emily Cameron stated that her daughter and sister was with her today and they will continue with her on this journey of recovery.  Sebasthian Stailey 09/19/2013, 8:31 PM

## 2013-09-19 NOTE — Progress Notes (Signed)
Adult Psychoeducational Group Note  Date:  09/19/2013 Time:  8:46 PM  Group Topic/Focus:  Wrap-Up Group:   The focus of this group is to help patients review their daily goal of treatment and discuss progress on daily workbooks.  Participation Level:  Active  Participation Quality:  Appropriate  Affect:  Appropriate  Cognitive:  Appropriate  Insight: Appropriate  Engagement in Group:  Engaged  Modes of Intervention:  Support  Additional Comments:  Pt stated that her daughter and her sister came by to visit and that they were able to resolve the problems between them and even embrace   Emily Cameron 09/19/2013, 8:46 PM

## 2013-09-19 NOTE — Progress Notes (Signed)
NUTRITION ASSESSMENT  Pt identified as at risk on the Malnutrition Screen Tool  INTERVENTION: 1. Educated patient on the importance of nutrition and encouraged intake of food and beverages. 2. Discussed weight goals. 3. Supplements: none at this time  Coupons provided for Ensure after discharge as needed.  NUTRITION DIAGNOSIS: Unintentional weight loss related to sub-optimal intake as evidenced by pt report.   Goal: Pt to meet >/= 90% of their estimated nutrition needs.  Monitor:  PO intake  Assessment:  Patient admitted with depression with SI.  Patient reports that she just started Remeron which helps her eat and had been off of this medicine for the past 8 months secondary to issues with medicaid.  Does not tolerate the Ensure Complete that we care here but does tolerate the Ensure Muscle.  Reports recent 5-10 lb weight loss secondary to decreased appetite.  UBW per patient 150 lbs 9 months ago.  53 y.o. female  Height: Ht Readings from Last 1 Encounters:  09/17/13 5\' 3"  (1.6 m)    Weight: Wt Readings from Last 1 Encounters:  09/17/13 113 lb (51.256 kg)    Weight Hx: Wt Readings from Last 10 Encounters:  09/17/13 113 lb (51.256 kg)  09/16/13 115 lb (52.164 kg)  09/14/13 115 lb (52.164 kg)  05/23/12 140 lb 3.2 oz (63.594 kg)  05/08/12 144 lb (65.318 kg)  05/03/12 148 lb 1.6 oz (67.178 kg)  03/31/12 144 lb 6.4 oz (65.499 kg)  02/21/12 147 lb (66.679 kg)  02/18/12 149 lb 8 oz (67.813 kg)  10/18/11 149 lb (67.586 kg)    BMI:  Body mass index is 20.02 kg/(m^2). Pt meets criteria for normal weight for height based on current BMI.  However, recommend patient increase to 120 lbs due to height, age and health.  Estimated Nutritional Needs: Kcal: 25-30 kcal/kg Protein: > 1 gram protein/kg Fluid: 1 ml/kcal  Diet Order: General Pt is also offered choice of unit snacks mid-morning and mid-afternoon.  Pt is eating as desired.   Lab results and medications reviewed.    Antonieta Iba, RD, LDN Clinical Inpatient Dietitian Pager:  [phone] Weekend and after hours pager:  [phone]

## 2013-09-19 NOTE — Tx Team (Signed)
Interdisciplinary Treatment Plan Update (Adult)  Date: 09/19/2013  Time Reviewed:  9:45 AM  Progress in Treatment: Attending groups: Yes Participating in groups:  Yes Taking medication as prescribed:  Yes Tolerating medication:  Yes Family/Significant othe contact made: CSW will attempt to talk to pt's son today Patient understands diagnosis:  Yes Discussing patient identified problems/goals with staff:  Yes Medical problems stabilized or resolved:  Yes Denies suicidal/homicidal ideation: Yes Issues/concerns per patient self-inventory:  Yes Other:  New problem(s) identified: N/A  Discharge Plan or Barriers: Pt will follow up at [facility] for outpatient medication management and therapy.    Reason for Continuation of Hospitalization: Anxiety Depression Medication Stabilization  Comments: N/A  Estimated length of stay: 1 day, d/c tomorrow  For review of initial/current patient goals, please see plan of care.  Attendees: Patient:    Family:     Physician:  Dr. Zorita Pang 09/19/2013 10:36 AM   Nursing:   Grayland Ormond, RN 09/19/2013 10:36 AM   Clinical Social Worker:  Regan Lemming, LCSW 09/19/2013 10:36 AM   Other: Catalina Pizza, PA 09/19/2013 10:36 AM   Other:  Gala Romney, care coordination 09/19/2013 10:36 AM   Other:  Joette Catching, LCSW 09/19/2013 10:36 AM   Other:  Gaylan Gerold, RN 09/19/2013 10:37 AM   Other:  Rosana Fret, RN 09/19/2013 10:37 AM   Other:  Lars Pinks, case manager 09/19/2013 10:37 AM   Other:    Other:    Other:      Scribe for Treatment Team:   Ane Payment, 09/19/2013 , 10:36 AM

## 2013-09-19 NOTE — Progress Notes (Signed)
ANTICOAGULATION CONSULT NOTE - Follow Up Consult  Pharmacy Consult for Coumadin Indication:h/o PE   No Known Allergies  Patient Measurements: Height: 5\' 3"  (160 cm) Weight: 113 lb (51.256 kg) IBW/kg (Calculated) : 52.4   Vital Signs: Temp: 97.4 F (36.3 C) (01/28 0700) Temp src: Oral (01/28 0700) BP: 108/70 mmHg (01/28 0701) Pulse Rate: 87 (01/28 0701)  Labs:  Recent Labs  09/16/13 1621 09/17/13 1525 09/18/13 0625 09/19/13 0620  HGB 13.0  --   --   --   HCT 35.9*  --   --   --   PLT 247  --   --   --   LABPROT  --  13.1 12.7 14.4  INR  --  1.01 0.97 1.14  CREATININE 0.69  --   --   --     Estimated Creatinine Clearance: 68.1 ml/min (by C-G formula based on Cr of 0.69).   Medications:  Scheduled:  . ARIPiprazole  5 mg Oral Daily  . cephALEXin  500 mg Oral QID  . mirtazapine  15 mg Oral QHS  . nicotine  14 mg Transdermal Daily  . traZODone  50 mg Oral QHS,MR X 1  . warfarin  10 mg Oral ONCE-1800  . Warfarin - Pharmacist Dosing Inpatient   Does not apply q1800    Assessment: INR increased to 1.14 this am.  Patient has no problems with therapy noted Goal of Therapy:  INR 2-3    Plan:  Coumadin 10 mg x 1 today PT/INR in am   Lenox Ponds 09/19/2013,9:32 AM

## 2013-09-19 NOTE — Progress Notes (Signed)
Patient ID: Emily Cameron, female   DOB: 05/03/1961, 53 y.o.   MRN: [id number]  D: Pt. Denies SI/HI and A/V Hallucinations. Patient rates her depression and hopelessness at 2/10 for the day. Patient reports that after she goes home she plans to take better care of herself by, "don't smoke cig or weed and no alcohol." Patient does not report any pain or discomfort at this time.   A: Support and encouragement provided to the patient to come to writer with any questions or concerns. Scheduled medications given to patient per physician's orders.  R: Patient is receptive and cooperative but minimal. Patient is seen in the milieu and is going to groups and interacting without conflict. Q15 minute checks are maintained for safety.
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
## 2013-09-19 NOTE — Progress Notes (Signed)
Patient in day room interacting with peers at the beginning of this shift. She appeared bright on approach and reported having a great day. She said her son visited and that made her very happy. Patient stated that she had to get her life back together ; "I need to get help and stop smoking week and drinking alcohol, I have been sober from crack since the past 18 years, I know I can do it with the help of God". She said she has 6 children and [place] kids and she has to be strong for them and enjoy life with them. Patient has insight and planned to follow up with [facility] and family services.  She denied SI/HI and denied hallucinations. She said she is looking forward to discharge tomorrow. Writer encouraged and supported patient. Q 15 minutes check continues as ordered to maintain safety.

## 2013-09-19 NOTE — Progress Notes (Signed)
Adult Psychoeducational Group Note  Date:  09/19/2013 Time:  1:59 PM  Group Topic/Focus:  Personal Choices and Values:   The focus of this group is to help patients assess and explore the importance of values in their lives, how their values affect their decisions, how they express their values and what opposes their expression.  Participation Level:  Did Not Attend   Emily Cameron 09/19/2013, 1:59 PM

## 2013-09-19 NOTE — BHH Group Notes (Signed)
[facility] LCSW Aftercare Discharge Planning Group Note   09/19/2013 8:45 AM  Participation Quality:  Alert, Appropriate and Oriented  Mood/Affect:  Bright  Depression Rating:  1  Anxiety Rating:  1  Thoughts of Suicide:  Pt denies SI/HI  Will you contract for safety?   Yes  Current AVH:  Pt denies  Plan for Discharge/Comments:  Pt attended discharge planning group and actively participated in group.  CSW provided pt with today's workbook.  Pt reports feeling well today and hopes to d/c soon.  Pt will follow up at [facility] for outpatient medication management and therapy.  Pt will return home with her son.  Pt has a lot of tasks she needs assistance with in the community, such as transferring her Medicaid back to [place], getting her own housing and transportation.  CSW discussed with Beverly Sessions to work with pt with transitional team.  No further needs voiced by pt at this time.    Transportation Means: Pt reports access to transportation - son will pick pt up   Supports: No supports mentioned at this time  Regan Lemming, [place] 09/19/2013 10:25 AM

## 2013-09-20 LAB — PROTIME-INR
INR: 1.52 — AB (ref 0.00–1.49)
Prothrombin Time: 17.9 seconds — ABNORMAL HIGH (ref 11.6–15.2)

## 2013-09-20 MED ORDER — CEPHALEXIN 500 MG PO CAPS: 500.0000 mg | ORAL_CAPSULE | Freq: Four times a day (QID) | ORAL | Status: DC

## 2013-09-20 MED ORDER — WARFARIN SODIUM 6 MG PO TABS: 6.0000 mg | ORAL_TABLET | Freq: Once | ORAL | Status: DC

## 2013-09-20 MED ORDER — NON FORMULARY: 1.0000 | Status: DC

## 2013-09-20 MED ORDER — MIRTAZAPINE 15 MG PO TABS: 15.0000 mg | ORAL_TABLET | Freq: Every day | ORAL | Status: DC

## 2013-09-20 MED ORDER — ARIPIPRAZOLE 5 MG PO TABS: 5.0000 mg | ORAL_TABLET | Freq: Every day | ORAL | Status: DC

## 2013-09-20 MED ORDER — TRAZODONE HCL 100 MG PO TABS
100.0000 mg | ORAL_TABLET | Freq: Every evening | ORAL | Status: DC | PRN
Start: 2013-09-20 — End: 2013-12-24

## 2013-09-20 MED ORDER — WARFARIN SODIUM 6 MG PO TABS
6.0000 mg | ORAL_TABLET | Freq: Once | ORAL | Status: AC
  Administered 2013-09-20: 6 mg via ORAL
  Filled 2013-09-20: qty 1

## 2013-09-20 NOTE — Progress Notes (Signed)
Patient ID: Emily Cameron, female   DOB: [birth year], 53 y.o.   MRN: [id number] She has been discharged home and was picked up by her sister. Her and sister were told that the con outpatient department was to call her about her PT-INR /coumadin for tomorrow. Patient was given the out patients phone number to use if needed. Both voiced understanding of above. She also voiced understanding of discharge instruction and of follow up plan. She denies SI thought and all belongings were taken home with her.

## 2013-09-20 NOTE — Progress Notes (Signed)
Adult Psychoeducational Group Note  Date:  09/20/2013 Time:  12:09 PM  Group Topic/Focus:  Stages of Change:   The focus of this group is to explain the stages of change and help patients identify changes they want to make upon discharge.  Participation Level:  Active  Participation Quality:  Appropriate  Affect:  Appropriate  Cognitive:  Alert  Insight: Appropriate  Engagement in Group:  Engaged  Modes of Intervention:  Discussion  Additional Comments:  Pt. participated and stated that interacting more with her grandchildren would be helpful.  Sonda Primes 09/20/2013, 12:09 PM
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
## 2013-09-20 NOTE — Progress Notes (Signed)
Patient ID: Emily Cameron, female   DOB: [birth year], 53 y.o.   MRN: [id number] She has been up and to groups interacting with peers and staff. Self inventory: depression 1, hopelessness 1, denies SI thoughts and pain. Denies having any discomfort, and that her thinking is clear.

## 2013-09-20 NOTE — [facility] Suicide Risk Assessment (Signed)
Suicide Risk Assessment  Discharge Assessment     Demographic Factors:  Adolescent or young adult, Low socioeconomic status and Unemployed  Mental Status Per Nursing Assessment::   On Admission:  NA  Current Mental Status by Physician: Patient is calm and cooperative. Patient has normal psychomotor activity. Patient has normal rate rhythm and volume of speech. Patient has good mood with the anxious affect. Patient has normal thought process and no suicidal, homicidal ideation, intentions or plans. Patient has no evidence of psychosis.  Loss Factors: Financial problems/change in socioeconomic status  Historical Factors: Prior suicide attempts, Family history of mental illness or substance abuse and Impulsivity  Risk Reduction Factors:   Sense of responsibility to family, Religious beliefs about death, Living with another person, especially a relative, Positive social support, Positive therapeutic relationship and Positive coping skills or problem solving skills  Continued Clinical Symptoms:  Bipolar Disorder:   Depressive phase Depression:   Recent sense of peace/wellbeing Previous Psychiatric Diagnoses and Treatments Medical Diagnoses and Treatments/Surgeries  Cognitive Features That Contribute To Risk:  Polarized thinking    Suicide Risk:  Minimal: No identifiable suicidal ideation.  Patients presenting with no risk factors but with morbid ruminations; may be classified as minimal risk based on the severity of the depressive symptoms  Discharge Diagnoses:   AXIS I:  Bipolar, Depressed AXIS II:  Deferred AXIS III:   Past Medical History  Diagnosis Date  . Anxiety   . Depression   . Insomnia   . Weight loss     due to anxiety  . PE (pulmonary embolism) 2006  . Alcohol abuse   . Polysubstance abuse     Current smoking and alcohol. 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 Marijuna not taking since 7 month.   Marland Kitchen COPD (chronic obstructive pulmonary disease)     AXIS IV:  other psychosocial or environmental problems, problems related to social environment and problems with primary support group AXIS V:  61-70 mild symptoms  Plan Of Care/Follow-up recommendations:  Activity:  As tolerated Diet:  Regular  Is patient on multiple antipsychotic therapies at discharge:  No   Has Patient had three or more failed trials of antipsychotic monotherapy by history:  No  Recommended Plan for Multiple Antipsychotic Therapies: NA  Letisia Schwalb,JANARDHAHA R. 09/20/2013, 10:32 AM

## 2013-09-20 NOTE — [facility] Suicide Risk Assessment (Signed)
Emily Cameron INPATIENT:  Family/Significant Other Suicide Prevention Education  Suicide Prevention Education:  Education Completed; Emily Cameron - son [phone]),  (name of family member/significant other) has been identified by the patient as the family member/significant other with whom the patient will be residing, and identified as the person(s) who will aid the patient in the event of a mental health crisis (suicidal ideations/suicide attempt).  With written consent from the patient, the family member/significant other has been provided the following suicide prevention education, prior to the and/or following the discharge of the patient.  The suicide prevention education provided includes the following:  Suicide risk factors  Suicide prevention and interventions  National Suicide Hotline telephone number  [facility] assessment telephone number  [facility] Emergency Assistance [place] and/or Residential Mobile Crisis Unit telephone number  Request made of family/significant other to:  Remove weapons (e.g., guns, rifles, knives), all items previously/currently identified as safety concern.    Remove drugs/medications (over-the-counter, prescriptions, illicit drugs), all items previously/currently identified as a safety concern.  The family member/significant other verbalizes understanding of the suicide prevention education information provided.  The family member/significant other agrees to remove the items of safety concern listed above.  Emily Cameron 09/20/2013, 8:42 AM

## 2013-09-20 NOTE — Progress Notes (Signed)
[facility] Adult Case Management Discharge Plan :  Will you be returning to the same living situation after discharge: Yes,  returning to son's home At discharge, do you have transportation home?:Yes,  family will pick pt up Do you have the ability to pay for your medications:Yes,  provided pt with samples and prescriptions.  Pt also referred to [facility] and can get assistance with affording meds there  Release of information consent forms completed and in the chart;  Patient's signature needed at discharge.  Patient to Follow up at: Follow-up Information   Follow up with [facility] On 09/26/2013. (Walk in for hospital discharge appointment. Walk in clinic is Monday - Friday 8 am - 3 pm. They will than schedule your appts for medication management and therapy. )    Contact information:   [address], [place] [address] Phone: [phone] Fax: [phone]      Patient denies SI/HI:   Yes,  denies SI/HI    Safety Planning and Suicide Prevention discussed:  Yes,  discussed with pt and pt's son.  See suicide prevention education note.   Ane Payment 09/20/2013, 8:42 AM

## 2013-09-20 NOTE — Progress Notes (Signed)
ANTICOAGULATION CONSULT NOTE - Follow Up Consult  Pharmacy Consult for Coumadin Indication: h/o PE  No Known Allergies  Patient Measurements: Height: 5\' 3"  (160 cm) Weight: 113 lb (51.256 kg) IBW/kg (Calculated) : 52.4   Vital Signs: Temp: 97.5 F (36.4 C) (01/29 0700) Temp src: Oral (01/29 0700) BP: 109/69 mmHg (01/29 0701) Pulse Rate: 91 (01/29 0701)  Labs:  Recent Labs  09/18/13 0625 09/19/13 0620 09/20/13 0613  LABPROT 12.7 14.4 17.9*  INR 0.97 1.14 1.52*    Estimated Creatinine Clearance: 68.1 ml/min (by C-G formula based on Cr of 0.69).   Medications:  Scheduled:  . ARIPiprazole  5 mg Oral Daily  . cephALEXin  500 mg Oral QID  . mirtazapine  15 mg Oral QHS  . nicotine  14 mg Transdermal Daily  . warfarin  6 mg Oral ONCE-1800  . Warfarin - Pharmacist Dosing Inpatient   Does not apply q1800    Assessment: INR increased to 1.5 today after 2 coumadin 10 mg doses.  No problems with therapy noted Below goal of INR 2-3 Goal of Therapy:  INR 2-3    Plan:  Coumadin 6 mg po x 1 today  PT/INR in AM   Einar Grad Marie 09/20/2013,9:56 AM

## 2013-09-20 NOTE — Progress Notes (Signed)
The focus of this group is to educate the patient on the purpose and policies of crisis stabilization and provide a format to answer questions about their admission.  The group details unit policies and expectations of patients while admitted.  Patient attended 0900 nurse education orientation group this morning.  Patient actively participated, appropriate affect, alert, appropriate insight and engagement.  Today patient will work on 3 goals for discharge.  

## 2013-09-21 ENCOUNTER — Ambulatory Visit: Payer: Self-pay | Admitting: Internal Medicine

## 2013-09-24 ENCOUNTER — Encounter: Payer: Self-pay | Admitting: Internal Medicine

## 2013-09-24 ENCOUNTER — Ambulatory Visit: Payer: Medicaid Other | Attending: Internal Medicine | Admitting: Internal Medicine

## 2013-09-24 VITALS — BP 120/75 | HR 75 | Temp 98.4°F | Resp 16 | Ht 64.0 in | Wt 114.0 lb

## 2013-09-24 DIAGNOSIS — I2699 Other pulmonary embolism without acute cor pulmonale: Secondary | ICD-10-CM

## 2013-09-24 DIAGNOSIS — F3289 Other specified depressive episodes: Secondary | ICD-10-CM

## 2013-09-24 DIAGNOSIS — J449 Chronic obstructive pulmonary disease, unspecified: Secondary | ICD-10-CM

## 2013-09-24 DIAGNOSIS — F411 Generalized anxiety disorder: Secondary | ICD-10-CM

## 2013-09-24 DIAGNOSIS — R490 Dysphonia: Secondary | ICD-10-CM | POA: Insufficient documentation

## 2013-09-24 DIAGNOSIS — F419 Anxiety disorder, unspecified: Secondary | ICD-10-CM

## 2013-09-24 DIAGNOSIS — Z5181 Encounter for therapeutic drug level monitoring: Secondary | ICD-10-CM

## 2013-09-24 DIAGNOSIS — J4489 Other specified chronic obstructive pulmonary disease: Secondary | ICD-10-CM

## 2013-09-24 DIAGNOSIS — F329 Major depressive disorder, single episode, unspecified: Secondary | ICD-10-CM | POA: Insufficient documentation

## 2013-09-24 DIAGNOSIS — H538 Other visual disturbances: Secondary | ICD-10-CM

## 2013-09-24 DIAGNOSIS — Z7901 Long term (current) use of anticoagulants: Secondary | ICD-10-CM

## 2013-09-24 LAB — POCT INR: INR: 3.9

## 2013-09-24 MED ORDER — WARFARIN SODIUM 2.5 MG PO TABS: 2.5000 mg | ORAL_TABLET | Freq: Every day | ORAL | Status: DC

## 2013-09-24 NOTE — Patient Instructions (Addendum)
Depression, Adult Depression refers to feeling sad, low, down in the dumps, blue, gloomy, or empty. In general, there are two kinds of depression: 1. Depression that we all experience from time to time because of upsetting life experiences, including the loss of a job or the ending of a relationship (normal sadness or normal grief). This kind of depression is considered normal, is short lived, and resolves within a few days to 2 weeks. (Depression experienced after the loss of a loved one is called bereavement. Bereavement often lasts longer than 2 weeks but normally gets better with time.) 2. Clinical depression, which lasts longer than normal sadness or normal grief or interferes with your ability to function at home, at work, and in school. It also interferes with your personal relationships. It affects almost every aspect of your life. Clinical depression is an illness. Symptoms of depression also can be caused by conditions other than normal sadness and grief or clinical depression. Examples of these conditions are listed as follows:  Physical illness Some physical illnesses, including underactive thyroid gland (hypothyroidism), severe anemia, specific types of cancer, diabetes, uncontrolled seizures, heart and lung problems, strokes, and chronic pain are commonly associated with symptoms of depression.  Side effects of some prescription medicine In some people, certain types of prescription medicine can cause symptoms of depression.  Substance abuse Abuse of alcohol and illicit drugs can cause symptoms of depression. SYMPTOMS Symptoms of normal sadness and normal grief include the following:  Feeling sad or crying for short periods of time.  Not caring about anything (apathy).  Difficulty sleeping or sleeping too much.  No longer able to enjoy the things you used to enjoy.  Desire to be by oneself all the time (social isolation).  Lack of energy or motivation.  Difficulty  concentrating or remembering.  Change in appetite or weight.  Restlessness or agitation. Symptoms of clinical depression include the same symptoms of normal sadness or normal grief and also the following symptoms:  Feeling sad or crying all the time.  Feelings of guilt or worthlessness.  Feelings of hopelessness or helplessness.  Thoughts of suicide or the desire to harm yourself (suicidal ideation).  Loss of touch with reality (psychotic symptoms). Seeing or hearing things that are not real (hallucinations) or having false beliefs about your life or the people around you (delusions and paranoia). DIAGNOSIS  The diagnosis of clinical depression usually is based on the severity and duration of the symptoms. Your caregiver also will ask you questions about your medical history and substance use to find out if physical illness, use of prescription medicine, or substance abuse is causing your depression. Your caregiver also may order blood tests. TREATMENT  Typically, normal sadness and normal grief do not require treatment. However, sometimes antidepressant medicine is prescribed for bereavement to ease the depressive symptoms until they resolve. The treatment for clinical depression depends on the severity of your symptoms but typically includes antidepressant medicine, counseling with a mental health professional, or a combination of both. Your caregiver will help to determine what treatment is best for you. Depression caused by physical illness usually goes away with appropriate medical treatment of the illness. If prescription medicine is causing depression, talk with your caregiver about stopping the medicine, decreasing the dose, or substituting another medicine. Depression caused by abuse of alcohol or illicit drugs abuse goes away with abstinence from these substances. Some adults need professional help in order to stop drinking or using drugs. SEEK IMMEDIATE CARE IF:  You have   thoughts  about hurting yourself or others.  You lose touch with reality (have psychotic symptoms).  You are taking medicine for depression and have a serious side effect. FOR MORE INFORMATION National Alliance on Mental Illness: www.nami.[employer] of Mental [employer]: https://carter.com/ Document Released: 08/06/2000 Document Revised: 02/08/2012 Document Reviewed: 11/08/2011 [facility] Patient Information 2014 [place].  Pt is to hold taking the coumadin today. Pt is to take 2.5mg  for 2 days and then she is to proceed 5mg  until INR receck in 7 days.

## 2013-09-24 NOTE — Progress Notes (Signed)
Pt is here to establish care. Pt needs to start medications again.

## 2013-09-24 NOTE — Progress Notes (Signed)
Patient ID: [name], female   DOB: Jun 28, 1961, 53 y.o.   MRN: [id number] Patient Demographics  Emily Cameron, is a 53 y.o. female  FGH:[id number]  [id number]:[id number]  DOB - 08/31/1960  CC:  Chief Complaint  Patient presents with  . Establish Care       HPI: Emily Cameron is a 53 y.o. female here today to establish medical care. Patient has a medical history significant for major depression and suicidal ideations in the past with repeated admissions,  generalized anxiety disorder, chronic nicotine abuse, polysubstance abuse including cocaine, last use was 15 years ago per patient. She is here today to establish medical care. She complains of chronic hoarseness of her voice leading to repeated throat clearing with no effect. She has been checked out by a specialist before with endoscopy, no significant findings the patient. She likes to be referred again for another check. She claims her depression is much better now that she is back on her medications, she denies any suicidal thoughts or ideation or attempts. She continues to smoke cigarette heavily, one pack per day, she will consider quitting after Valentine's Day when counseled. She has history of pulmonary embolism twice and is on lifelong anticoagulation with Coumadin and regular INR check. Her last INR 4 days ago was 1.52. Patient claims compliant with medication. She drinks alcohol heavily including vodka.  Patient has No headache, No chest pain, No abdominal pain - No Nausea, No new weakness tingling or numbness, No Cough - SOB.  No Known Allergies Past Medical History  Diagnosis Date  . Anxiety   . Depression   . Insomnia   . Weight loss     due to anxiety  . PE (pulmonary embolism) 2006  . Alcohol abuse   . Polysubstance abuse     Current smoking and alcohol. 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 Marijuna not taking since 7 month.   Marland Kitchen COPD (chronic obstructive pulmonary disease)    Current Outpatient  Prescriptions on File Prior to Visit  Medication Sig Dispense Refill  . ARIPiprazole (ABILIFY) 5 MG tablet Take 1 tablet (5 mg total) by mouth daily.  30 tablet  0  . cephALEXin (KEFLEX) 500 MG capsule Take 1 capsule (500 mg total) by mouth 4 (four) times daily.  10 capsule  0  . mirtazapine (REMERON) 15 MG tablet Take 1 tablet (15 mg total) by mouth at bedtime.  30 tablet  0  . traZODone (DESYREL) 100 MG tablet Take 1 tablet (100 mg total) by mouth at bedtime as needed for sleep.  30 tablet  0  . NON FORMULARY Take 1 tablet by mouth as directed. Coumadin dosing by anticoagulation md/clinic. Need f/u INR 09/21/13 for follow-up dosing  1 tablet  1  . traZODone (DESYREL) 50 MG tablet Take 1 tablet (50 mg total) by mouth at bedtime.  30 tablet  3   No current facility-administered medications on file prior to visit.   Family History  Problem Relation Age of Onset  . Coronary artery disease Sister   . Heart disease Mother   . Hyperlipidemia Mother   . Hypertension Mother   . Aneurysm Brother 66  . Aneurysm Paternal Aunt 33    Died   . Heart disease Maternal Grandmother   . Pulmonary embolism Brother    History   Social History  . Marital Status: Single    Spouse Name: N/A    Number of Children: N/A  . Years of Education:  N/A   Occupational History  . Oncologist   . housekeeping    Social History Main Topics  . Smoking status: Current Every Day Smoker -- 0.50 packs/day for 31 years    Types: Cigarettes  . Smokeless tobacco: Never Used     Comment: TO LET DOCTOR KNOW WHEN READY TO QUIT.  Marland Kitchen Alcohol Use: Yes     Comment: 2 beers a week  . Drug Use: Yes    Special: Marijuana  . Sexual Activity: Yes    Birth Control/ Protection: None   Other Topics Concern  . Not on file   Social History Narrative   Single, does not exercise regularly    Review of Systems: Constitutional: Negative for fever, chills, diaphoresis, activity change, appetite change and fatigue. HENT:  Negative for ear pain, nosebleeds, congestion, facial swelling, rhinorrhea, neck pain, neck stiffness and ear discharge.  Eyes: Negative for pain, discharge, redness, itching and visual disturbance. Respiratory: Negative for cough, choking, chest tightness, shortness of breath, wheezing and stridor. Hoarse voice Cardiovascular: Negative for chest pain, palpitations and leg swelling. Gastrointestinal: Negative for abdominal distention. Genitourinary: Negative for dysuria, urgency, frequency, hematuria, flank pain, decreased urine volume, difficulty urinating and dyspareunia.  Musculoskeletal: Negative for back pain, joint swelling, arthralgia and gait problem. Neurological: Negative for dizziness, tremors, seizures, syncope, facial asymmetry, speech difficulty, weakness, light-headedness, numbness and headaches.  Hematological: Negative for adenopathy. Does not bruise/bleed easily. Psychiatric/Behavioral: Negative for hallucinations, behavioral problems, confusion, dysphoric mood, decreased concentration and agitation.    Objective:   Filed Vitals:   09/24/13 1416  BP: 120/75  Pulse: 75  Temp: 98.4 F (36.9 C)  Resp: 16    Physical Exam: Constitutional: Patient appears well-developed and well-nourished. No distress. HENT: Normocephalic, atraumatic, External right and left ear normal. Oropharynx is clear and moist.  Eyes: Conjunctivae and EOM are normal. PERRLA, no scleral icterus. Neck: Normal ROM. Neck supple. No JVD. No tracheal deviation. No thyromegaly. CVS: RRR, S1/S2 +, no murmurs, no gallops, no carotid bruit.  Pulmonary: Effort and breath sounds normal, no stridor, rhonchi, wheezes, rales.  Abdominal: Soft. BS +, no distension, tenderness, rebound or guarding.  Musculoskeletal: Normal range of motion. No edema and no tenderness.  Lymphadenopathy: No lymphadenopathy noted, cervical, inguinal or axillary Neuro: Alert. Normal reflexes, muscle tone coordination. No cranial nerve  deficit. Skin: Skin is warm and dry. No rash noted. Not diaphoretic. No erythema. No pallor. Psychiatric: Normal mood and affect. Behavior, judgment, thought content normal.  Lab Results  Component Value Date   WBC 15.3* 09/16/2013   HGB 13.0 09/16/2013   HCT 35.9* 09/16/2013   MCV 89.8 09/16/2013   PLT 247 09/16/2013   Lab Results  Component Value Date   CREATININE 0.69 09/16/2013   BUN 7 09/16/2013   NA 138 09/16/2013   K 3.2* 09/16/2013   CL 98 09/16/2013   CO2 26 09/16/2013    No results found for this basename: HGBA1C   Lipid Panel     Component Value Date/Time   CHOL 199 02/17/2011 1706   TRIG 85 02/17/2011 1706   HDL 58 02/17/2011 1706   CHOLHDL 3.4 02/17/2011 1706   VLDL 17 02/17/2011 1706   LDLCALC 124* 02/17/2011 1706       Assessment and plan:   1. Pulmonary embolism Lifelong anticoagulation, will consider changing to Xarelto at an appropriate time  2. COPD (chronic obstructive pulmonary disease) Patient counseled extensively on smoking cessation  3. Anxiety  - TSH  4. Depressive  disorder, not elsewhere classified  - COMPLETE METABOLIC PANEL WITH GFR - Lipid panel  5. Blurry vision, bilateral  - Ambulatory referral to Ophthalmology  6. Monitoring for long-term anticoagulant use  - INR: 3.9 today, hold Coumadin for today, stat 2.5 mg tablet Coumadin daily for 2 days from tomorrow, then 5 mg daily See in one week for INR  7. Hoarseness of voice  - Ambulatory referral to ENT  Patient counseled extensively about nutrition and exercise  Follow up in one week for INR check  The patient was given clear instructions to go to ER or return to medical center if symptoms don't improve, worsen or new problems develop. The patient verbalized understanding. The patient was told to call to get lab results if they haven't heard anything in the next week.     [name], MD, [place], [place], [place],  [place]   09/24/2013, 2:46 PM

## 2013-09-25 LAB — TSH: TSH: 1.279 u[IU]/mL (ref 0.350–4.500)

## 2013-09-25 LAB — COMPLETE METABOLIC PANEL WITH GFR
ALBUMIN: 3.9 g/dL (ref 3.5–5.2)
ALK PHOS: 68 U/L (ref 39–117)
ALT: 13 U/L (ref 0–35)
AST: 18 U/L (ref 0–37)
BILIRUBIN TOTAL: 0.3 mg/dL (ref 0.2–1.2)
BUN: 6 mg/dL (ref 6–23)
CO2: 30 mEq/L (ref 19–32)
Calcium: 9.7 mg/dL (ref 8.4–10.5)
Chloride: 104 mEq/L (ref 96–112)
Creat: 0.71 mg/dL (ref 0.50–1.10)
GFR, Est African American: >89 mL/min
Glucose, Bld: 74 mg/dL (ref 70–99)
Potassium: 3.6 mEq/L (ref 3.5–5.3)
Sodium: 144 mEq/L (ref 135–145)
Total Protein: 7.2 g/dL (ref 6.0–8.3)

## 2013-09-25 LAB — LIPID PANEL
CHOL/HDL RATIO: 3.7 ratio
CHOLESTEROL: 190 mg/dL (ref 0–200)
HDL: 52 mg/dL (ref >39)
LDL Cholesterol: 120 mg/dL — ABNORMAL HIGH (ref 0–99)
TRIGLYCERIDES: 88 mg/dL (ref <150)
VLDL: 18 mg/dL (ref 0–40)

## 2013-09-25 NOTE — Progress Notes (Signed)
Patient Discharge Instructions:  After Visit Summary (AVS):   Faxed to:  09/25/13 Discharge Summary Note:   Faxed to:  09/25/13 Psychiatric Admission Assessment Note:   Faxed to:  09/25/13 Suicide Risk Assessment - Discharge Assessment:   Faxed to:  09/25/13 Faxed/Sent to the Next Level Care provider:  09/25/13 Faxed to [facility] @ [place], 09/25/2013, 4:07 PM

## 2013-09-26 ENCOUNTER — Telehealth: Payer: Self-pay | Admitting: *Deleted

## 2013-09-26 NOTE — Telephone Encounter (Signed)
Left message with her son to call me back.

## 2013-09-26 NOTE — Telephone Encounter (Signed)
Message copied by Joan Mayans on Wed Sep 26, 2013  4:49 PM ------      Message from: Tresa Garter      Created: Tue Sep 25, 2013  5:15 PM       Please inform patient that her labs are within normal limits except for slightly high cholesterol. We advise regular physical exercise and nutritional control with low-cholesterol and low-fat diet. We will check this cholesterol level again in 6 months ------

## 2013-10-01 ENCOUNTER — Ambulatory Visit: Payer: Medicaid Other | Attending: Internal Medicine

## 2013-10-01 DIAGNOSIS — I8290 Acute embolism and thrombosis of unspecified vein: Secondary | ICD-10-CM

## 2013-10-01 DIAGNOSIS — I2699 Other pulmonary embolism without acute cor pulmonale: Secondary | ICD-10-CM

## 2013-10-01 DIAGNOSIS — I749 Embolism and thrombosis of unspecified artery: Secondary | ICD-10-CM

## 2013-10-01 LAB — POCT INR: INR: 2.3

## 2013-10-01 NOTE — Patient Instructions (Signed)
Pt instructed to continue taking 2.5 mg Coumadin and return next week

## 2013-10-08 ENCOUNTER — Other Ambulatory Visit: Payer: Self-pay

## 2013-10-23 ENCOUNTER — Ambulatory Visit: Payer: Medicaid Other | Attending: Internal Medicine

## 2013-10-23 DIAGNOSIS — I2699 Other pulmonary embolism without acute cor pulmonale: Secondary | ICD-10-CM

## 2013-10-23 LAB — POCT INR: INR: 5.9

## 2013-10-23 NOTE — Progress Notes (Unsigned)
PT HERE FOR INR RECHECK Taking prescribed Coumadin 2.5 mg daily INR 5.9 Denies bleeding

## 2013-10-23 NOTE — Patient Instructions (Signed)
Pt instructed to hold Coumadin for 2 dys and return on Friday 09/28/13

## 2013-10-26 ENCOUNTER — Ambulatory Visit: Payer: Medicaid Other | Attending: Internal Medicine

## 2013-10-26 DIAGNOSIS — I2699 Other pulmonary embolism without acute cor pulmonale: Secondary | ICD-10-CM

## 2013-10-26 LAB — POCT INR: INR: 1.4

## 2013-10-26 MED ORDER — WARFARIN SODIUM 2.5 MG PO TABS: 2.5000 mg | ORAL_TABLET | Freq: Every day | ORAL | Status: DC

## 2013-10-26 NOTE — Patient Instructions (Signed)
Pt instructed to take 5mg  Coumadin today, then resume 2.5 mg daily and return next Friday

## 2013-10-26 NOTE — Addendum Note (Signed)
Addended by: Candie Chroman D on: 10/26/2013 11:06 AM   Modules accepted: Orders

## 2013-11-02 ENCOUNTER — Other Ambulatory Visit: Payer: Self-pay

## 2013-11-05 ENCOUNTER — Ambulatory Visit: Payer: Medicaid Other | Attending: Internal Medicine

## 2013-11-05 DIAGNOSIS — I82409 Acute embolism and thrombosis of unspecified deep veins of unspecified lower extremity: Secondary | ICD-10-CM

## 2013-11-05 LAB — POCT INR: INR: 2.4

## 2013-11-05 NOTE — Patient Instructions (Signed)
Pt instructed to continue taking 2.5 mg dose daily and return next week for recheck

## 2013-11-05 NOTE — Progress Notes (Unsigned)
Pt here for INR recheck Taking Coumadin 2.5 mg daily INR 2.4

## 2013-11-12 ENCOUNTER — Other Ambulatory Visit: Payer: Self-pay

## 2013-11-23 ENCOUNTER — Ambulatory Visit: Payer: Medicaid Other | Attending: Internal Medicine

## 2013-11-23 DIAGNOSIS — I82409 Acute embolism and thrombosis of unspecified deep veins of unspecified lower extremity: Secondary | ICD-10-CM

## 2013-11-23 LAB — POCT INR: INR: 3.2

## 2013-11-23 NOTE — Patient Instructions (Signed)
Pt instructed to take 2.5 mg tonight and resume 5 mg tablet until next appt 11/30/13

## 2013-11-30 ENCOUNTER — Other Ambulatory Visit: Payer: Self-pay

## 2013-12-11 ENCOUNTER — Other Ambulatory Visit: Payer: Self-pay

## 2013-12-24 ENCOUNTER — Ambulatory Visit: Payer: Medicaid Other | Attending: Internal Medicine | Admitting: Internal Medicine

## 2013-12-24 ENCOUNTER — Encounter: Payer: Self-pay | Admitting: Internal Medicine

## 2013-12-24 VITALS — BP 97/59 | HR 71 | Temp 98.4°F | Resp 20 | Ht 62.5 in | Wt 124.2 lb

## 2013-12-24 DIAGNOSIS — J4489 Other specified chronic obstructive pulmonary disease: Secondary | ICD-10-CM | POA: Insufficient documentation

## 2013-12-24 DIAGNOSIS — I82409 Acute embolism and thrombosis of unspecified deep veins of unspecified lower extremity: Secondary | ICD-10-CM

## 2013-12-24 DIAGNOSIS — G4700 Insomnia, unspecified: Secondary | ICD-10-CM

## 2013-12-24 DIAGNOSIS — F411 Generalized anxiety disorder: Secondary | ICD-10-CM | POA: Diagnosis not present

## 2013-12-24 DIAGNOSIS — Z86711 Personal history of pulmonary embolism: Secondary | ICD-10-CM | POA: Insufficient documentation

## 2013-12-24 DIAGNOSIS — F17200 Nicotine dependence, unspecified, uncomplicated: Secondary | ICD-10-CM | POA: Diagnosis not present

## 2013-12-24 DIAGNOSIS — Z5181 Encounter for therapeutic drug level monitoring: Secondary | ICD-10-CM

## 2013-12-24 DIAGNOSIS — F339 Major depressive disorder, recurrent, unspecified: Secondary | ICD-10-CM | POA: Diagnosis not present

## 2013-12-24 DIAGNOSIS — R05 Cough: Secondary | ICD-10-CM | POA: Insufficient documentation

## 2013-12-24 DIAGNOSIS — R059 Cough, unspecified: Secondary | ICD-10-CM | POA: Diagnosis not present

## 2013-12-24 DIAGNOSIS — R490 Dysphonia: Secondary | ICD-10-CM | POA: Diagnosis not present

## 2013-12-24 DIAGNOSIS — Z79899 Other long term (current) drug therapy: Secondary | ICD-10-CM | POA: Insufficient documentation

## 2013-12-24 DIAGNOSIS — R0602 Shortness of breath: Secondary | ICD-10-CM | POA: Diagnosis not present

## 2013-12-24 DIAGNOSIS — J449 Chronic obstructive pulmonary disease, unspecified: Secondary | ICD-10-CM | POA: Diagnosis not present

## 2013-12-24 DIAGNOSIS — Z7901 Long term (current) use of anticoagulants: Secondary | ICD-10-CM | POA: Diagnosis not present

## 2013-12-24 DIAGNOSIS — F3289 Other specified depressive episodes: Secondary | ICD-10-CM

## 2013-12-24 DIAGNOSIS — F329 Major depressive disorder, single episode, unspecified: Secondary | ICD-10-CM

## 2013-12-24 DIAGNOSIS — I2699 Other pulmonary embolism without acute cor pulmonale: Secondary | ICD-10-CM

## 2013-12-24 HISTORY — DX: Acute embolism and thrombosis of unspecified deep veins of unspecified lower extremity: I82.409

## 2013-12-24 LAB — POCT INR: INR: 1.1

## 2013-12-24 MED ORDER — TRAZODONE HCL 100 MG PO TABS: 100.0000 mg | ORAL_TABLET | Freq: Every evening | ORAL | Status: DC | PRN

## 2013-12-24 MED ORDER — WARFARIN SODIUM 5 MG PO TABS
5.0000 mg | ORAL_TABLET | Freq: Every day | ORAL | Status: DC
Start: 2013-12-24 — End: 2014-04-25

## 2013-12-24 MED ORDER — MIRTAZAPINE 15 MG PO TABS: 15.0000 mg | ORAL_TABLET | Freq: Every day | ORAL | Status: DC

## 2013-12-24 MED ORDER — ARIPIPRAZOLE 5 MG PO TABS
5.0000 mg | ORAL_TABLET | Freq: Every day | ORAL | Status: DC
Start: 2013-12-24 — End: 2014-04-25

## 2013-12-24 NOTE — Patient Instructions (Signed)

## 2013-12-24 NOTE — Progress Notes (Signed)
Pt here for increased throat clearing that she thinks is causing a cough. Also states she can't stay asleep. Thinks it is due to sleep apnea.

## 2013-12-24 NOTE — Progress Notes (Signed)
Patient ID: Emily Cameron, female   DOB: 21-Sep-1960, 53 y.o.   MRN: [id number]   Emily Cameron, is a 53 y.o. female  [id number]:[id number]  [id number]  DOB - 03-15-1961  Chief Complaint  Patient presents with  . Follow-up  . Cough        Subjective:   Emily Cameron is a 53 y.o. female here today for a follow up visit. . Patient has a medical history significant for major depression and suicidal ideations in the past with repeated admissions, generalized anxiety disorder, chronic nicotine abuse, polysubstance abuse including cocaine, last use was 15 years ago per patient. She complains of chronic hoarseness of her voice leading to repeated throat clearing. She has been checked out by a specialist before with endoscopy, no significant findings recorded. She likes to be referred again for another check. She continue to smoke heavily about half a pack of cigarettes per day, she drinks alcohol. She is also concerned about sleeplessness but she thinks it has nothing to do with her depression and/or anxiety because she's feeling much better in that aspect. She denies any suicidal thoughts or ideation. She coughs occasionally but this dry cough. Her medications are listed below. Patient has No headache, No chest pain, No abdominal pain - No Nausea, No new weakness tingling or numbness.  Problem  Dvt (Deep Venous Thrombosis)    ALLERGIES: No Known Allergies  PAST MEDICAL HISTORY: Past Medical History  Diagnosis Date  . Anxiety   . Depression   . Insomnia   . Weight loss     due to anxiety  . PE (pulmonary embolism) 2006  . Alcohol abuse   . Polysubstance abuse     Current smoking and alcohol. 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 Marijuna not taking since 7 month.   Marland Kitchen COPD (chronic obstructive pulmonary disease)     MEDICATIONS AT HOME: Prior to Admission medications   Medication Sig Start Date End Date Taking? Authorizing Provider  ARIPiprazole (ABILIFY) 5 MG tablet Take  1 tablet (5 mg total) by mouth daily. 12/24/13   [name], MD  cephALEXin (KEFLEX) 500 MG capsule Take 1 capsule (500 mg total) by mouth 4 (four) times daily. 09/20/13   Benjamine Mola, FNP  mirtazapine (REMERON) 15 MG tablet Take 1 tablet (15 mg total) by mouth at bedtime. 12/24/13   [name], MD  NON FORMULARY Take 1 tablet by mouth as directed. Coumadin dosing by anticoagulation md/clinic. Need f/u INR 09/21/13 for follow-up dosing 09/20/13   Durward Parcel, MD  traZODone (DESYREL) 100 MG tablet Take 1 tablet (100 mg total) by mouth at bedtime as needed for sleep. 12/24/13   [name], MD  warfarin (COUMADIN) 5 MG tablet Take 1 tablet (5 mg total) by mouth daily at 6 PM. 12/24/13   [name], MD     Objective:   Filed Vitals:   12/24/13 1554  BP: 97/59  Pulse: 71  Temp: 98.4 F (36.9 C)  TempSrc: Oral  Resp: 20  Height: 5' 2.5" (1.588 m)  Weight: 124 lb 3.2 oz (56.337 kg)  SpO2: 100%    Exam General appearance : Awake, alert, not in any distress. Speech Clear. Not toxic looking, raspy voice HEENT: Atraumatic and Normocephalic, pupils equally reactive to light and accomodation Neck: supple, no JVD. No cervical lymphadenopathy.  Chest:Good air entry bilaterally, no added sounds  CVS: S1 S2 regular, no murmurs.  Abdomen: Bowel sounds present, Non tender and not distended  with no gaurding, rigidity or rebound. Extremities: B/L Lower Ext shows no edema, both legs are warm to touch Neurology: Awake alert, and oriented X 3, CN II-XII intact, Non focal Skin:No Rash Wounds:N/A  Data Review No results found for this basename: HGBA1C     Assessment & Plan   1. Monitoring for long-term anticoagulant use  - INR  2. DVT (deep venous thrombosis) Continue anticoagulation  3. Hoarseness of voice  - Ambulatory referral to ENT  4. Shortness of breath Possibly due to COPD Patient counseled on smoking cessation  5. Insomnia,  unspecified Ordered sleep study - Split night study; Future  6. Pulmonary embolism Continue Coumadin rate regular INR check - warfarin (COUMADIN) 5 MG tablet; Take 1 tablet (5 mg total) by mouth daily at 6 PM.  Dispense: 30 tablet; Refill: 0  7. Depressive disorder, not elsewhere classified  - traZODone (DESYREL) 100 MG tablet; Take 1 tablet (100 mg total) by mouth at bedtime as needed for sleep.  Dispense: 30 tablet; Refill: 3 - mirtazapine (REMERON) 15 MG tablet; Take 1 tablet (15 mg total) by mouth at bedtime.  Dispense: 30 tablet; Refill: 3 - ARIPiprazole (ABILIFY) 5 MG tablet; Take 1 tablet (5 mg total) by mouth daily.  Dispense: 30 tablet; Refill: 3  Patient was extensively counseled on nutrition and exercise Patient was again counseled about smoking cessation Return in about 1 week (around 12/31/2013) for INR Check.  The patient was given clear instructions to go to ER or return to medical center if symptoms don't improve, worsen or new problems develop. The patient verbalized understanding. The patient was told to call to get lab results if they haven't heard anything in the next week.   This note has been created with Surveyor, quantity. Any transcriptional errors are unintentional.    [name], MD, [place], [place], [place] and [facility] [place], [place]   12/24/2013, 4:34 PM

## 2013-12-31 ENCOUNTER — Ambulatory Visit: Payer: Medicaid Other | Attending: Internal Medicine

## 2013-12-31 DIAGNOSIS — I82409 Acute embolism and thrombosis of unspecified deep veins of unspecified lower extremity: Secondary | ICD-10-CM

## 2013-12-31 LAB — POCT INR: INR: 2.1

## 2014-01-07 ENCOUNTER — Other Ambulatory Visit: Payer: Self-pay

## 2014-01-24 ENCOUNTER — Ambulatory Visit: Payer: Medicaid Other | Attending: Internal Medicine

## 2014-01-24 ENCOUNTER — Other Ambulatory Visit: Payer: Self-pay | Admitting: Emergency Medicine

## 2014-01-24 ENCOUNTER — Other Ambulatory Visit: Payer: Self-pay | Admitting: *Deleted

## 2014-01-24 DIAGNOSIS — I82409 Acute embolism and thrombosis of unspecified deep veins of unspecified lower extremity: Secondary | ICD-10-CM

## 2014-01-24 DIAGNOSIS — I2699 Other pulmonary embolism without acute cor pulmonale: Secondary | ICD-10-CM

## 2014-01-24 DIAGNOSIS — Z7901 Long term (current) use of anticoagulants: Secondary | ICD-10-CM

## 2014-01-24 DIAGNOSIS — Z5181 Encounter for therapeutic drug level monitoring: Secondary | ICD-10-CM

## 2014-01-24 LAB — POCT INR: INR: 1.1

## 2014-01-24 NOTE — Patient Instructions (Signed)
Pt needs to return next Thursday for recheck

## 2014-02-14 ENCOUNTER — Ambulatory Visit (HOSPITAL_BASED_OUTPATIENT_CLINIC_OR_DEPARTMENT_OTHER): Payer: Medicaid Other

## 2014-03-01 ENCOUNTER — Telehealth: Payer: Self-pay | Admitting: *Deleted

## 2014-03-01 NOTE — Telephone Encounter (Signed)
Received a message form Emily Cameron at ENT office.  Emily Cameron states the patient is needing to have surgery for hoarseness and Dr. Janace Hoard wants her to come off her blood thinner. Returned [employer]. No answer left a message for Emily Cameron to return our call.

## 2014-04-01 ENCOUNTER — Ambulatory Visit (HOSPITAL_BASED_OUTPATIENT_CLINIC_OR_DEPARTMENT_OTHER): Payer: Medicaid Other | Attending: Internal Medicine

## 2014-04-25 ENCOUNTER — Ambulatory Visit: Payer: Medicaid Other | Attending: Internal Medicine

## 2014-04-25 DIAGNOSIS — I82409 Acute embolism and thrombosis of unspecified deep veins of unspecified lower extremity: Secondary | ICD-10-CM

## 2014-04-25 DIAGNOSIS — F3289 Other specified depressive episodes: Secondary | ICD-10-CM

## 2014-04-25 DIAGNOSIS — Z5181 Encounter for therapeutic drug level monitoring: Secondary | ICD-10-CM

## 2014-04-25 DIAGNOSIS — Z7901 Long term (current) use of anticoagulants: Secondary | ICD-10-CM

## 2014-04-25 DIAGNOSIS — I2699 Other pulmonary embolism without acute cor pulmonale: Secondary | ICD-10-CM

## 2014-04-25 DIAGNOSIS — F329 Major depressive disorder, single episode, unspecified: Secondary | ICD-10-CM

## 2014-04-25 MED ORDER — ARIPIPRAZOLE 5 MG PO TABS: 5.0000 mg | ORAL_TABLET | Freq: Every day | ORAL | Status: DC

## 2014-04-25 MED ORDER — TRAZODONE HCL 100 MG PO TABS: 100.0000 mg | ORAL_TABLET | Freq: Every evening | ORAL | Status: DC | PRN

## 2014-04-25 MED ORDER — MIRTAZAPINE 15 MG PO TABS: 15.0000 mg | ORAL_TABLET | Freq: Every day | ORAL | Status: DC

## 2014-04-25 MED ORDER — MEGESTROL ACETATE 400 MG/10ML PO SUSP: 400.0000 mg | Freq: Every day | ORAL | Status: DC

## 2014-04-25 MED ORDER — WARFARIN SODIUM 5 MG PO TABS: 5.0000 mg | ORAL_TABLET | Freq: Every day | ORAL | Status: DC

## 2014-04-25 NOTE — Patient Instructions (Signed)
Take 2 tablets tonight and tomorrow then continue taking 1 tablet until next Wednesday Return to clinic next Thursday

## 2014-04-25 NOTE — Progress Notes (Unsigned)
LCSW met with patient who was distressed about several things going on.  LCSW provided encouragement for patient.  LCSW also provided patient with contact information for [facility] so that she can be referred for psychiatrist who acepts Medicaid. LCSW will also work on finding resources to support with connecting utilities in her new place.  Christene Lye MSW, LCSW

## 2014-05-02 ENCOUNTER — Telehealth: Payer: Self-pay | Admitting: Emergency Medicine

## 2014-05-02 ENCOUNTER — Ambulatory Visit: Payer: Medicaid Other | Attending: Internal Medicine

## 2014-05-02 DIAGNOSIS — Z5181 Encounter for therapeutic drug level monitoring: Secondary | ICD-10-CM

## 2014-05-02 DIAGNOSIS — I2699 Other pulmonary embolism without acute cor pulmonale: Secondary | ICD-10-CM

## 2014-05-02 DIAGNOSIS — I82409 Acute embolism and thrombosis of unspecified deep veins of unspecified lower extremity: Secondary | ICD-10-CM

## 2014-05-02 DIAGNOSIS — Z7901 Long term (current) use of anticoagulants: Secondary | ICD-10-CM

## 2014-05-02 LAB — POCT INR
INR: 1.1
INR: 1.1

## 2014-05-02 NOTE — Progress Notes (Unsigned)
Pt comes in for INR check per Coumadin therapy Pt hasn't had INR checked since 01/24/14. States she is in a bad place financially and emotionally.  She states she is currently homeless. Pt will see Tywan after visit INR- 1.1. Admits to not taking medication due to finances Denies bleeding or headaches  Today

## 2014-05-02 NOTE — Telephone Encounter (Signed)
Spoke with pt regarding INR dosing Pt instructed to take 2 tablets tonight and return next Thursday for recheck

## 2014-05-10 ENCOUNTER — Telehealth: Payer: Self-pay | Admitting: Internal Medicine

## 2014-05-10 NOTE — Telephone Encounter (Signed)
Glynda calling from Dr Janace Hoard, Ear Nose and Throat Doctor to request medical clearance for pt to have a surgery done on her vocal cords.  Per Romie Levee, Dr wanting to see if pt. can be taken off of her coumadin for at least 5 days prior to surgery date. (surgery not yet scheduled). Pls contact office for more details.

## 2014-05-15 NOTE — Telephone Encounter (Signed)
Spoke with Emily Cameron at Dr. Janace Hoard office regarding holding pt Coumadin for throat surgery. Pt has been non compliant with taking Coumadin. I will call pt to schedule INR prior for clearance

## 2014-06-10 ENCOUNTER — Telehealth: Payer: Self-pay | Admitting: Internal Medicine

## 2014-06-10 NOTE — Telephone Encounter (Signed)
Patient has called in today to speak with nurse about her coumadin treatment; Patient says that she was told by ENT to contact nurse about ceasing coumadin for further procedures; please f/u with patient

## 2014-06-14 ENCOUNTER — Encounter (HOSPITAL_COMMUNITY): Payer: Self-pay | Admitting: Pharmacy Technician

## 2014-06-17 ENCOUNTER — Telehealth: Payer: Self-pay | Admitting: Emergency Medicine

## 2014-06-17 NOTE — Telephone Encounter (Signed)
After speaking with Dr. Doreene Burke, pt has been very non complaint with Coumadin dosing as well as keeping scheduled appointments For INR checks. Dr. Doreene Burke will approve pt to have ENT surgery on vocal cords I will call dr. Janace Hoard office tomorrow

## 2014-06-18 ENCOUNTER — Ambulatory Visit (HOSPITAL_COMMUNITY)
Admission: RE | Admit: 2014-06-18 | Discharge: 2014-06-18 | Disposition: A | Discharge status: Home / Self Care | Payer: Medicaid Other | Source: Ambulatory Visit | Attending: Otolaryngology | Admitting: Otolaryngology

## 2014-06-18 ENCOUNTER — Other Ambulatory Visit: Payer: Self-pay | Admitting: Otolaryngology

## 2014-06-18 ENCOUNTER — Encounter (HOSPITAL_COMMUNITY)
Admission: RE | Admit: 2014-06-18 | Discharge: 2014-06-18 | Disposition: A | Discharge status: Home / Self Care | Payer: Medicaid Other | Source: Ambulatory Visit | Attending: Otolaryngology | Admitting: Otolaryngology

## 2014-06-18 ENCOUNTER — Telehealth: Payer: Self-pay | Admitting: Emergency Medicine

## 2014-06-18 ENCOUNTER — Encounter (HOSPITAL_COMMUNITY): Payer: Self-pay

## 2014-06-18 VITALS — BP 113/56 | HR 65 | Temp 98.6°F | Resp 18 | Ht 63.0 in | Wt 123.6 lb

## 2014-06-18 DIAGNOSIS — R509 Fever, unspecified: Secondary | ICD-10-CM | POA: Diagnosis not present

## 2014-06-18 DIAGNOSIS — F1910 Other psychoactive substance abuse, uncomplicated: Secondary | ICD-10-CM | POA: Diagnosis not present

## 2014-06-18 DIAGNOSIS — J381 Polyp of vocal cord and larynx: Secondary | ICD-10-CM | POA: Diagnosis not present

## 2014-06-18 DIAGNOSIS — R05 Cough: Secondary | ICD-10-CM | POA: Insufficient documentation

## 2014-06-18 DIAGNOSIS — F1099 Alcohol use, unspecified with unspecified alcohol-induced disorder: Secondary | ICD-10-CM | POA: Diagnosis not present

## 2014-06-18 DIAGNOSIS — R490 Dysphonia: Secondary | ICD-10-CM | POA: Diagnosis present

## 2014-06-18 DIAGNOSIS — Z87898 Personal history of other specified conditions: Secondary | ICD-10-CM | POA: Diagnosis not present

## 2014-06-18 DIAGNOSIS — R059 Cough, unspecified: Secondary | ICD-10-CM

## 2014-06-18 DIAGNOSIS — F17210 Nicotine dependence, cigarettes, uncomplicated: Secondary | ICD-10-CM | POA: Diagnosis not present

## 2014-06-18 DIAGNOSIS — R0600 Dyspnea, unspecified: Secondary | ICD-10-CM | POA: Diagnosis present

## 2014-06-18 LAB — BASIC METABOLIC PANEL
ANION GAP: 11 (ref 5–15)
BUN: 6 mg/dL (ref 6–23)
CALCIUM: 9.2 mg/dL (ref 8.4–10.5)
CO2: 27 meq/L (ref 19–32)
Chloride: 106 mEq/L (ref 96–112)
Creatinine, Ser: 0.78 mg/dL (ref 0.50–1.10)
GLUCOSE: 60 mg/dL — AB (ref 70–99)
Potassium: 3.1 mEq/L — ABNORMAL LOW (ref 3.7–5.3)
SODIUM: 144 meq/L (ref 137–147)

## 2014-06-18 LAB — CBC
HCT: 35.9 % — ABNORMAL LOW (ref 36.0–46.0)
HEMOGLOBIN: 12.5 g/dL (ref 12.0–15.0)
MCH: 31.8 pg (ref 26.0–34.0)
MCHC: 34.8 g/dL (ref 30.0–36.0)
MCV: 91.3 fL (ref 78.0–100.0)
PLATELETS: 207 10*3/uL (ref 150–400)
RBC: 3.93 MIL/uL (ref 3.87–5.11)
RDW: 13.6 % (ref 11.5–15.5)
WBC: 7.1 10*3/uL (ref 4.0–10.5)

## 2014-06-18 LAB — PROTIME-INR
INR: 1.05 (ref 0.00–1.49)
PROTHROMBIN TIME: 13.8 s (ref 11.6–15.2)

## 2014-06-18 LAB — APTT: aPTT: 31 seconds (ref 24–37)

## 2014-06-18 IMAGING — CR DG CHEST 2V
2 series · 2 of 2 positions shown · non-contrast
Comparison: 04/30/2013

CLINICAL DATA: Persistent dyspnea, cough, fever

EXAM:
CHEST  2 VIEW

[w chest pa]
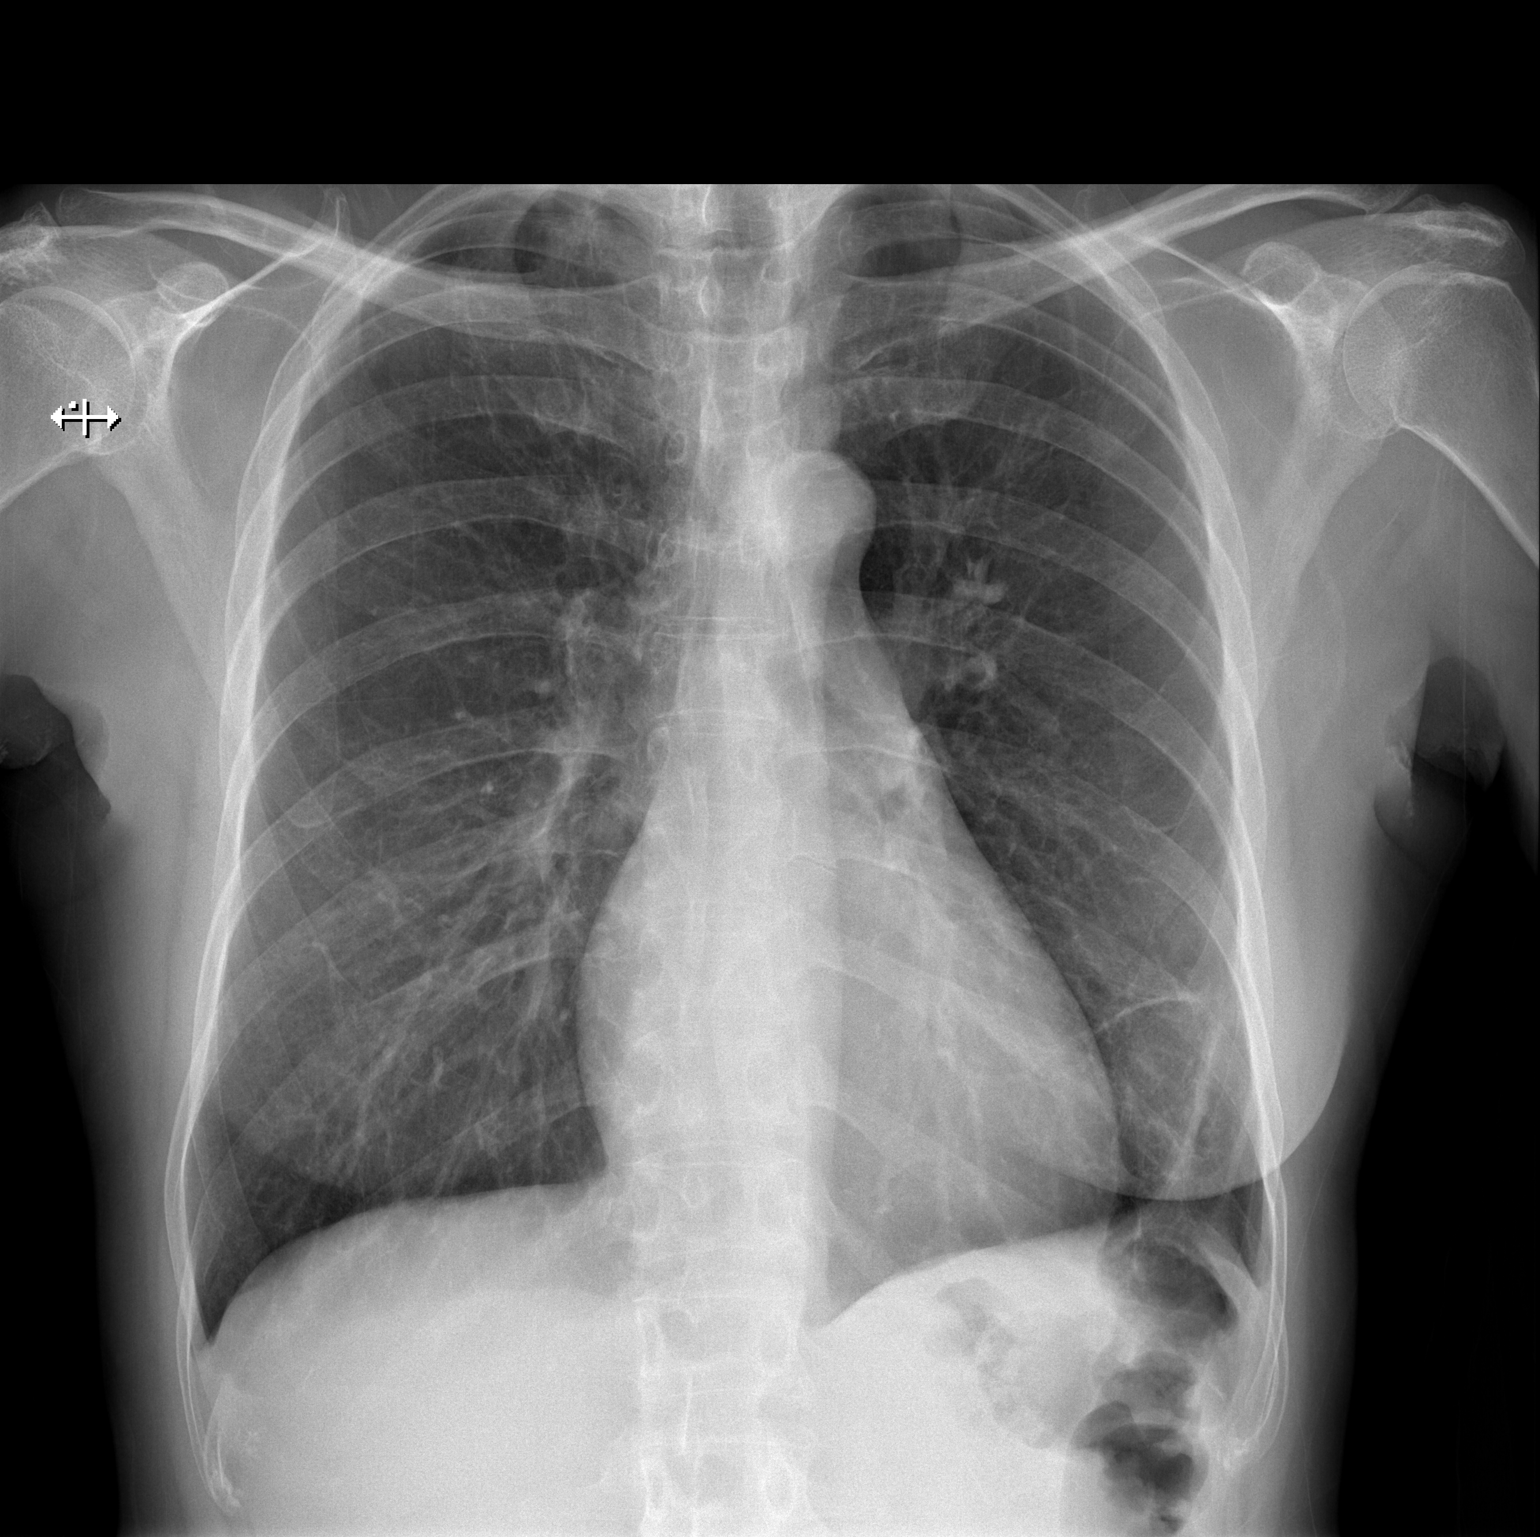

[w chest lat]
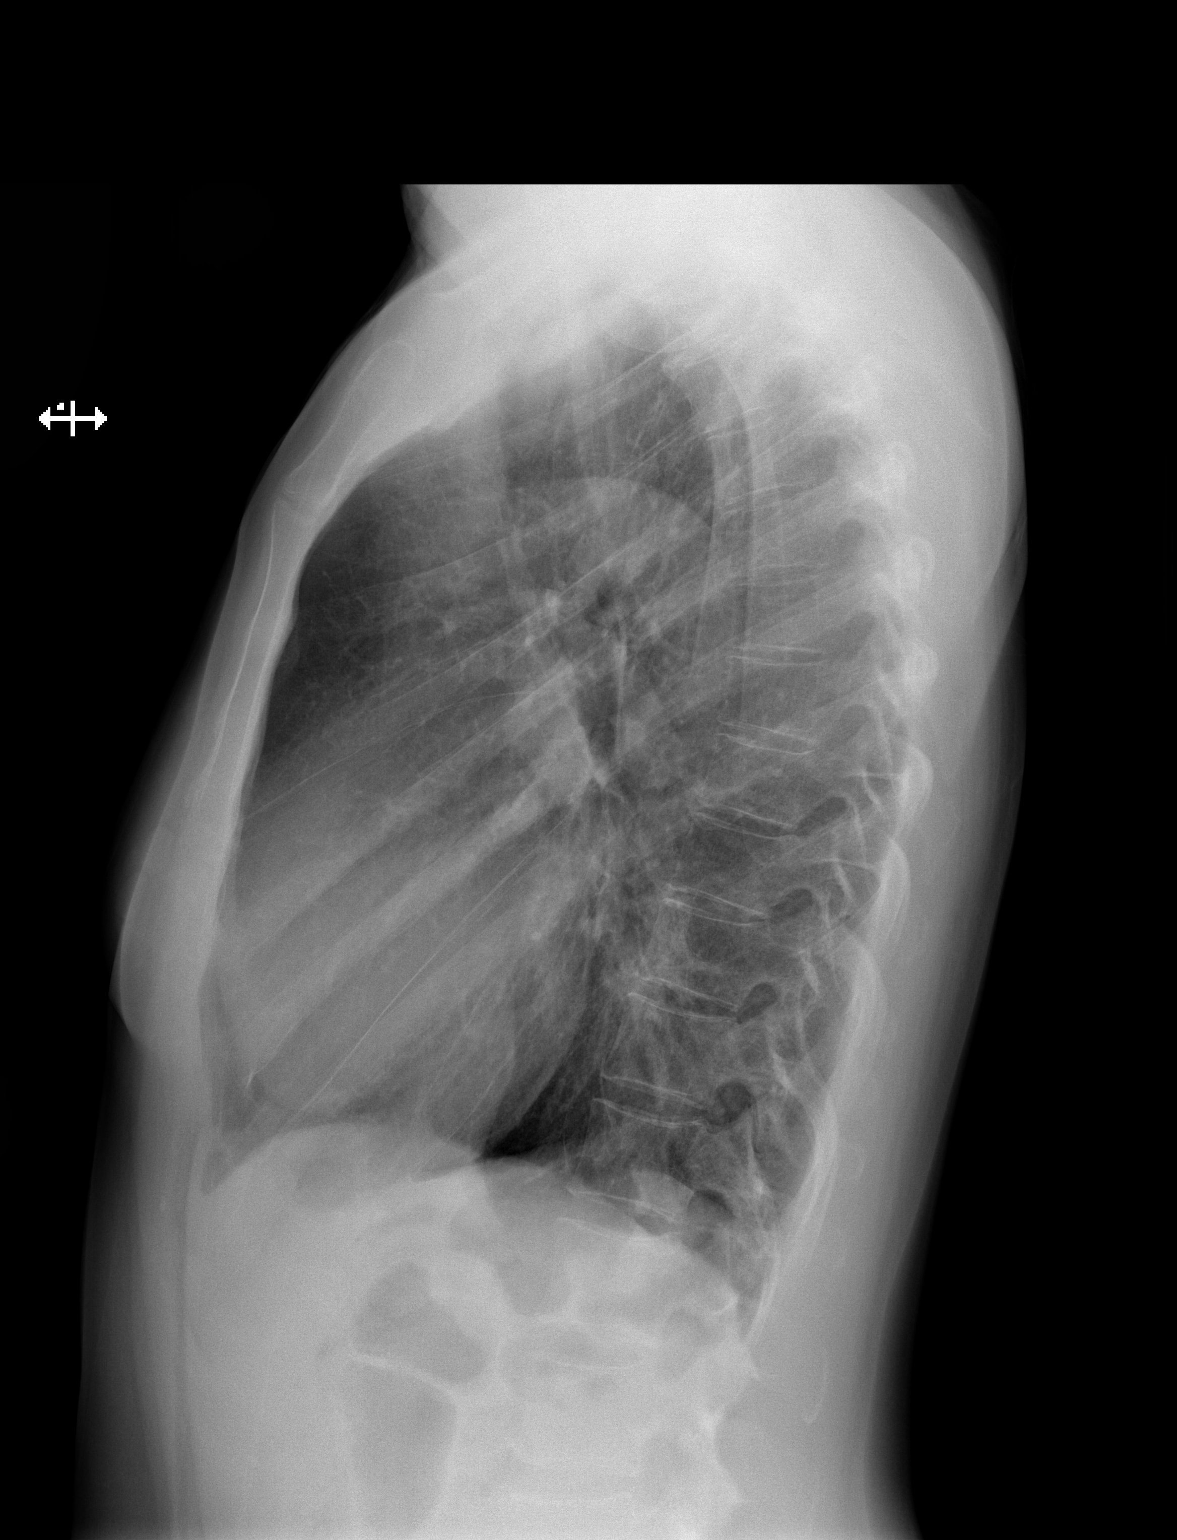

[2 of 2 positions shown; findings below may reference images not displayed]

FINDINGS: There is a linear band of fibrosis at the left lung base. There is
no focal parenchymal opacity, pleural effusion, or pneumothorax. The
heart and mediastinal contours are unremarkable.

The osseous structures are unremarkable.
IMPRESSION: No active cardiopulmonary disease.

## 2014-06-18 NOTE — Progress Notes (Signed)
Per pt stopped eliquis about two weeks ago.

## 2014-06-18 NOTE — Telephone Encounter (Signed)
Spoke with Dr. Janace Hoard nurse Romie Levee. I will fax medical clearance for pt scheduled surgery to [phone]

## 2014-06-18 NOTE — Pre-Procedure Instructions (Signed)
Emily Cameron  06/18/2014   Your procedure is scheduled on:  June 20, 2014  Report to [facility] Admitting at 7 AM.  Call this number if you have problems the morning of surgery: [phone]   Remember:   Do not eat food or drink liquids after midnight.   Take these medicines the morning of surgery with A SIP OF WATER: ARIPiprazole (ABILIFY), megestrol (MEGACE)   COUMADIN AS DIRECTED    Do not wear jewelry, make-up or nail polish.  Do not wear lotions, powders, or perfumes. You may wear deodorant.  Do not shave 48 hours prior to surgery. Men may shave face and neck.  Do not bring valuables to the hospital.  [facility] is not responsible for any belongings or valuables.               Contacts, dentures or bridgework may not be worn into surgery.  Leave suitcase in the car. After surgery it may be brought to your room.  For patients admitted to the hospital, discharge time is determined by your                treatment team.               Patients discharged the day of surgery will not be allowed to drive  home.  Name and phone number of your driver: FAMILY/FRIEND      Please read over the following fact sheets that you were given: Pain Booklet, Coughing and Deep Breathing and Surgical Site Infection Prevention

## 2014-06-18 NOTE — Progress Notes (Signed)
No presurgical orders, Glenda notified.

## 2014-06-19 NOTE — Anesthesia Preprocedure Evaluation (Addendum)
Anesthesia Evaluation  Patient identified by MRN, date of birth, ID band Patient awake    Reviewed: Allergy & Precautions, H&P , NPO status , Patient's Chart, lab work & pertinent test results  Airway Mallampati: II   Neck ROM: Full    Dental  (+) Teeth Intact, Dental Advisory Given   Pulmonary sleep apnea , COPDCurrent Smoker (30 pack year),  breath sounds clear to auscultation        Cardiovascular Rhythm:Regular     Neuro/Psych Anxiety Depression    GI/Hepatic   Endo/Other  Documented hypoglycemia on previous labs   Renal/GU      Musculoskeletal   Abdominal (+)  Abdomen: soft.    Peds  Hematology   Anesthesia Other Findings   Reproductive/Obstetrics                         Anesthesia Physical Anesthesia Plan  ASA: III  Anesthesia Plan: General   Post-op Pain Management:    Induction:   Airway Management Planned: Oral ETT  Additional Equipment:   Intra-op Plan:   Post-operative Plan: Extubation in OR  Informed Consent: I have reviewed the patients History and Physical, chart, labs and discussed the procedure including the risks, benefits and alternatives for the proposed anesthesia with the patient or authorized representative who has indicated his/her understanding and acceptance.     Plan Discussed with:   Anesthesia Plan Comments: (Would like blood sugar IN AM, off anticoagulation now, clotting ok, hx of PE)       Anesthesia Quick Evaluation

## 2014-06-20 ENCOUNTER — Ambulatory Visit (HOSPITAL_COMMUNITY): Payer: Medicaid Other | Admitting: Anesthesiology

## 2014-06-20 ENCOUNTER — Encounter (HOSPITAL_COMMUNITY): Payer: Self-pay | Admitting: *Deleted

## 2014-06-20 ENCOUNTER — Ambulatory Visit (HOSPITAL_COMMUNITY)
Admission: RE | Admit: 2014-06-20 | Discharge: 2014-06-20 | Disposition: A | Discharge status: Home / Self Care | Payer: Medicaid Other | Source: Ambulatory Visit | Attending: Otolaryngology | Admitting: Otolaryngology

## 2014-06-20 ENCOUNTER — Encounter (HOSPITAL_COMMUNITY): Payer: Medicaid Other | Admitting: Anesthesiology

## 2014-06-20 ENCOUNTER — Encounter (HOSPITAL_COMMUNITY)
Admission: RE | Disposition: A | Discharge status: Home / Self Care | Payer: Self-pay | Source: Ambulatory Visit | Attending: Otolaryngology

## 2014-06-20 DIAGNOSIS — J381 Polyp of vocal cord and larynx: Secondary | ICD-10-CM | POA: Insufficient documentation

## 2014-06-20 DIAGNOSIS — F17210 Nicotine dependence, cigarettes, uncomplicated: Secondary | ICD-10-CM | POA: Diagnosis not present

## 2014-06-20 DIAGNOSIS — F1099 Alcohol use, unspecified with unspecified alcohol-induced disorder: Secondary | ICD-10-CM | POA: Insufficient documentation

## 2014-06-20 DIAGNOSIS — Z87898 Personal history of other specified conditions: Secondary | ICD-10-CM | POA: Insufficient documentation

## 2014-06-20 DIAGNOSIS — F1910 Other psychoactive substance abuse, uncomplicated: Secondary | ICD-10-CM | POA: Diagnosis not present

## 2014-06-20 DIAGNOSIS — J383 Other diseases of vocal cords: Secondary | ICD-10-CM

## 2014-06-20 HISTORY — PX: MICROLARYNGOSCOPY: SHX5208

## 2014-06-20 HISTORY — DX: Personal history of urinary calculi: Z87.442

## 2014-06-20 SURGERY — MICROLARYNGOSCOPY
Anesthesia: General | Site: Mouth | Laterality: Left

## 2014-06-20 MED ORDER — MIDAZOLAM HCL 2 MG/2ML IJ SOLN
INTRAMUSCULAR | Status: AC
  Filled 2014-06-20: qty 2

## 2014-06-20 MED ORDER — LIDOCAINE HCL (CARDIAC) 20 MG/ML IV SOLN
INTRAVENOUS | Status: DC | PRN
  Administered 2014-06-20: 100 mg via INTRAVENOUS

## 2014-06-20 MED ORDER — LACTATED RINGERS IV SOLN
INTRAVENOUS | Status: DC
  Administered 2014-06-20: 08:00:00 via INTRAVENOUS

## 2014-06-20 MED ORDER — LACTATED RINGERS IV SOLN
INTRAVENOUS | Status: DC | PRN
  Administered 2014-06-20: 09:00:00 via INTRAVENOUS

## 2014-06-20 MED ORDER — HEPARIN SODIUM (PORCINE) 1000 UNIT/ML IJ SOLN
INTRAMUSCULAR | Status: AC
  Filled 2014-06-20: qty 1

## 2014-06-20 MED ORDER — FENTANYL CITRATE 0.05 MG/ML IJ SOLN
INTRAMUSCULAR | Status: DC | PRN
  Administered 2014-06-20 (×2): 50 ug via INTRAVENOUS

## 2014-06-20 MED ORDER — EPINEPHRINE HCL (NASAL) 0.1 % NA SOLN
NASAL | Status: DC | PRN
  Administered 2014-06-20: 30 mL

## 2014-06-20 MED ORDER — PROPOFOL 10 MG/ML IV BOLUS
INTRAVENOUS | Status: DC | PRN
  Administered 2014-06-20: 50 mg via INTRAVENOUS
  Administered 2014-06-20: 150 mg via INTRAVENOUS

## 2014-06-20 MED ORDER — FENTANYL CITRATE 0.05 MG/ML IJ SOLN
INTRAMUSCULAR | Status: AC
  Filled 2014-06-20: qty 5

## 2014-06-20 MED ORDER — ONDANSETRON HCL 4 MG/2ML IJ SOLN
INTRAMUSCULAR | Status: DC | PRN
  Administered 2014-06-20: 4 mg via INTRAVENOUS

## 2014-06-20 MED ORDER — SUCCINYLCHOLINE CHLORIDE 20 MG/ML IJ SOLN
INTRAMUSCULAR | Status: DC | PRN
  Administered 2014-06-20 (×2): 100 mg via INTRAVENOUS

## 2014-06-20 MED ORDER — HEPARIN SODIUM (PORCINE) 1000 UNIT/ML IJ SOLN
INTRAMUSCULAR | Status: DC | PRN
  Administered 2014-06-20: 3000 [IU] via INTRAVENOUS
  Administered 2014-06-20: 2000 [IU] via INTRAVENOUS

## 2014-06-20 MED ORDER — 0.9 % SODIUM CHLORIDE (POUR BTL) OPTIME
TOPICAL | Status: DC | PRN
  Administered 2014-06-20: 1000 mL

## 2014-06-20 MED ORDER — MIDAZOLAM HCL 5 MG/5ML IJ SOLN
INTRAMUSCULAR | Status: DC | PRN
  Administered 2014-06-20: 2 mg via INTRAVENOUS

## 2014-06-20 SURGICAL SUPPLY — 23 items
BLADE 10 SAFETY STRL DISP (BLADE) IMPLANT
CANISTER SUCTION 2500CC (MISCELLANEOUS) ×3 IMPLANT
CONT SPEC 4OZ CLIKSEAL STRL BL (MISCELLANEOUS) ×2 IMPLANT
COVER MAYO STAND STRL (DRAPES) ×3 IMPLANT
COVER TABLE BACK 60X90 (DRAPES) ×3 IMPLANT
CRADLE DONUT ADULT HEAD (MISCELLANEOUS) IMPLANT
DRAPE PROXIMA HALF (DRAPES) ×3 IMPLANT
GAUZE SPONGE 4X4 12PLY STRL (GAUZE/BANDAGES/DRESSINGS) ×3 IMPLANT
GLOVE SS BIOGEL STRL SZ 7.5 (GLOVE) ×1 IMPLANT
GLOVE SUPERSENSE BIOGEL SZ 7.5 (GLOVE) ×2
GLOVE SURG SS PI 7.0 STRL IVOR (GLOVE) ×2 IMPLANT
GUARD TEETH (MISCELLANEOUS) ×2 IMPLANT
KIT BASIN OR (CUSTOM PROCEDURE TRAY) ×3 IMPLANT
KIT ROOM TURNOVER OR (KITS) ×3 IMPLANT
MARKER SKIN DUAL TIP RULER LAB (MISCELLANEOUS) IMPLANT
NS IRRIG 1000ML POUR BTL (IV SOLUTION) ×3 IMPLANT
PAD ARMBOARD 7.5X6 YLW CONV (MISCELLANEOUS) ×6 IMPLANT
PATTIES SURGICAL .5 X1 (DISPOSABLE) IMPLANT
SPECIMEN JAR SMALL (MISCELLANEOUS) ×3 IMPLANT
TOWEL OR 17X24 6PK STRL BLUE (TOWEL DISPOSABLE) ×6 IMPLANT
TUBE CONNECTING 12'X1/4 (SUCTIONS) ×1
TUBE CONNECTING 12X1/4 (SUCTIONS) ×2 IMPLANT
WATER STERILE IRR 1000ML POUR (IV SOLUTION) ×1 IMPLANT

## 2014-06-20 NOTE — Anesthesia Postprocedure Evaluation (Signed)
  Anesthesia Post-op Note  Patient: Emily Cameron  Procedure(s) Performed: Procedure(s): MICROLARYNGOSCOPY WITH REMOVAL OF LEFT VOCAL CORD MASS (Left)  Patient Location: PACU  Anesthesia Type:General  Level of Consciousness: awake, alert  and oriented  Airway and Oxygen Therapy: Patient Spontanous Breathing and Patient connected to nasal cannula oxygen  Post-op Pain: mild  Post-op Assessment: Post-op Vital signs reviewed, Patient's Cardiovascular Status Stable, Respiratory Function Stable, Patent Airway and No signs of Nausea or vomiting  Post-op Vital Signs: Reviewed and stable  Last Vitals:  Filed Vitals:   06/20/14 0930  BP:   Pulse: 90  Temp:   Resp: 20    Complications: No apparent anesthesia complications

## 2014-06-20 NOTE — Progress Notes (Signed)
She states that she is off the coumadin now for 2 weeks and she is not going to take it. She has talked to her primary about this and aware of today's surgery.

## 2014-06-20 NOTE — H&P (Signed)
Emily Cameron is an 53 y.o. female.   Chief Complaint:hoarseness HPI: Hx of vocal lesion and she wants it removed now that medical therapy has failed  Past Medical History  Diagnosis Date  . Anxiety   . Depression   . Insomnia   . Weight loss     due to anxiety  . PE (pulmonary embolism) 2006  . Alcohol abuse   . Polysubstance abuse     Current smoking and alcohol. 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 Marijuna not taking since 7 month.   Marland Kitchen COPD (chronic obstructive pulmonary disease)   . History of kidney stones     Past Surgical History  Procedure Laterality Date  . Abdominal hysterectomy    . Cyst excision    . Lithotripsy      Family History  Problem Relation Age of Onset  . Coronary artery disease Sister   . Heart disease Mother   . Hyperlipidemia Mother   . Hypertension Mother   . Aneurysm Brother 53  . Aneurysm Paternal Aunt 63    Died   . Heart disease Maternal Grandmother   . Pulmonary embolism Brother    Social History:  reports that she has been smoking Cigarettes.  She has a 15.5 pack-year smoking history. She has never used smokeless tobacco. She reports that she drinks alcohol. She reports that she uses illicit drugs (Marijuana).  Allergies: No Known Allergies  Medications Prior to Admission  Medication Sig Dispense Refill  . ARIPiprazole (ABILIFY) 5 MG tablet Take 1 tablet (5 mg total) by mouth daily.  30 tablet  3  . megestrol (MEGACE) 400 MG/10ML suspension Take 400 mg by mouth 2 (two) times a week.      . mirtazapine (REMERON) 15 MG tablet Take 1 tablet (15 mg total) by mouth at bedtime.  30 tablet  3  . Tetrahydrozoline HCl (VISINE OP) Place 2 drops into both eyes as needed (for dry eyes).      . traZODone (DESYREL) 100 MG tablet Take 1 tablet (100 mg total) by mouth at bedtime as needed for sleep.  30 tablet  3  . warfarin (COUMADIN) 5 MG tablet Take 1 tablet (5 mg total) by mouth daily at 6 PM.  30 tablet  0    Results for  orders placed during the hospital encounter of 06/18/14 (from the past 48 hour(s))  BASIC METABOLIC PANEL     Status: Abnormal   Collection Time    06/18/14  1:29 PM      Result Value Ref Range   Sodium 144  137 - 147 mEq/L   Potassium 3.1 (*) 3.7 - 5.3 mEq/L   Chloride 106  96 - 112 mEq/L   CO2 27  19 - 32 mEq/L   Glucose, Bld 60 (*) 70 - 99 mg/dL   BUN 6  6 - 23 mg/dL   Creatinine, Ser 0.78  0.50 - 1.10 mg/dL   Calcium 9.2  8.4 - 10.5 mg/dL   GFR calc non Af Amer >90  >90 mL/min   GFR calc Af Amer >90  >90 mL/min   Comment: (NOTE)     The eGFR has been calculated using the CKD EPI equation.     This calculation has not been validated in all clinical situations.     eGFR's persistently <90 mL/min signify possible Chronic Kidney     Disease.   Anion gap 11  5 - 15  CBC  Status: Abnormal   Collection Time    06/18/14  1:29 PM      Result Value Ref Range   WBC 7.1  4.0 - 10.5 K/uL   RBC 3.93  3.87 - 5.11 MIL/uL   Hemoglobin 12.5  12.0 - 15.0 g/dL   HCT 35.9 (*) 36.0 - 46.0 %   MCV 91.3  78.0 - 100.0 fL   MCH 31.8  26.0 - 34.0 pg   MCHC 34.8  30.0 - 36.0 g/dL   RDW 13.6  11.5 - 15.5 %   Platelets 207  150 - 400 K/uL  PROTIME-INR     Status: None   Collection Time    06/18/14  1:29 PM      Result Value Ref Range   Prothrombin Time 13.8  11.6 - 15.2 seconds   INR 1.05  0.00 - 1.49  APTT     Status: None   Collection Time    06/18/14  1:29 PM      Result Value Ref Range   aPTT 31  24 - 37 seconds   Dg Chest 2 View  06/18/2014   CLINICAL DATA:  Persistent dyspnea, cough, fever  EXAM: CHEST  2 VIEW  COMPARISON:  04/30/2013  FINDINGS: There is a linear band of fibrosis at the left lung base. There is no focal parenchymal opacity, pleural effusion, or pneumothorax. The heart and mediastinal contours are unremarkable.  The osseous structures are unremarkable.  IMPRESSION: No active cardiopulmonary disease.   Electronically Signed   By: Kathreen Devoid   On: 06/18/2014 16:42     Review of Systems  Constitutional: Negative.   HENT: Negative.   Eyes: Negative.   Respiratory: Negative.   Cardiovascular: Negative.   Skin: Negative.     Blood pressure 123/77, pulse 71, temperature 98 F (36.7 C), temperature source Oral, resp. rate 18, weight 56.048 kg (123 lb 9 oz), last menstrual period 01/26/2004, SpO2 100.00%. Physical Exam  Constitutional: She appears well-developed and well-nourished.  HENT:  Nose: Nose normal.  Mouth/Throat: Oropharynx is clear and moist.  Eyes: Conjunctivae are normal. Pupils are equal, round, and reactive to light.  Neck: Normal range of motion. Neck supple.  Cardiovascular: Normal rate.   Respiratory: Effort normal.  GI: Soft.  Musculoskeletal: Normal range of motion.     Assessment/Plan Vocal cord mass- discussed surgery and she is ready to proceed. She has been approved by her primary for being off the coumadin for the surgery.   Melissa Montane 06/20/2014, 8:26 AM

## 2014-06-20 NOTE — Discharge Instructions (Signed)
Resume your Coumadin per your primary care doctor instructions. Try to rest the voice. Be sure not to lie around or  lay in bed for any significant amount of time. Normal diet. Follow-up in 2-3 weeks sooner if any issues arise.

## 2014-06-20 NOTE — Op Note (Signed)
Preop/postop diagnosis: Left vocal cord mass Procedure: Microlaryngoscopy with excision of vocal cord mass Anesthesia: Gen. Estimated blood loss: Less than 5 mL Indications: 53 year old with a mass on the left vocal cord visualized by fiberoptic exam. She has tried medical therapy without relief. She has persistent hoarseness and very bothered by this and wants to have the mass removed. She was informed the risk, benefits, and options. All questions are answered with discussion of the procedure. Consent was obtained. Procedure: Patient was taken to the operating room placed in the supine position after general endotracheal tube anesthesia was examined with the Dedo scope after placing a tooth guard. The larynx had an obvious large reddish mass on the left vocal cord. It was pedicled from the mid to slightly lateral cord. A pledget was placed in the subglottis and then the mass was grasped with the cup forceps. Straight scissors were used to dissected off the vocal cord at its base. It removed nicely. Once removed the vocal fold edge was clean and smooth. The mass was not involving the vocal fold. A pledget was placed in the area for hemostasis. The pledgets were removed. There was no bleeding. There is no other lesions identified. She was awake and brought to recovery in stable condition counts correct

## 2014-06-20 NOTE — Transfer of Care (Signed)
Immediate Anesthesia Transfer of Care Note  Patient: Emily Cameron  Procedure(s) Performed: Procedure(s): MICROLARYNGOSCOPY WITH REMOVAL OF LEFT VOCAL CORD MASS (Left)  Patient Location: PACU  Anesthesia Type:General  Level of Consciousness: awake and alert   Airway & Oxygen Therapy: Patient Spontanous Breathing  Post-op Assessment: Report given to PACU RN and Post -op Vital signs reviewed and stable  Post vital signs: Reviewed and stable  Complications: No apparent anesthesia complications

## 2014-06-21 ENCOUNTER — Encounter (HOSPITAL_COMMUNITY): Payer: Self-pay | Admitting: Otolaryngology

## 2014-06-28 ENCOUNTER — Other Ambulatory Visit: Payer: Self-pay | Admitting: Internal Medicine

## 2014-07-02 ENCOUNTER — Other Ambulatory Visit: Payer: Self-pay | Admitting: Emergency Medicine

## 2014-07-02 DIAGNOSIS — I2699 Other pulmonary embolism without acute cor pulmonale: Secondary | ICD-10-CM

## 2014-07-02 MED ORDER — WARFARIN SODIUM 5 MG PO TABS
5.0000 mg | ORAL_TABLET | Freq: Every day | ORAL | Status: DC
Start: 2014-07-02 — End: 2014-07-04

## 2014-07-04 ENCOUNTER — Ambulatory Visit: Payer: Medicaid Other

## 2014-07-04 ENCOUNTER — Ambulatory Visit: Payer: Medicaid Other | Attending: Internal Medicine | Admitting: Internal Medicine

## 2014-07-04 ENCOUNTER — Encounter: Payer: Self-pay | Admitting: Internal Medicine

## 2014-07-04 VITALS — BP 105/65 | HR 66 | Temp 99.3°F | Resp 14 | Ht 62.0 in | Wt 130.0 lb

## 2014-07-04 DIAGNOSIS — Z79818 Long term (current) use of other agents affecting estrogen receptors and estrogen levels: Secondary | ICD-10-CM | POA: Diagnosis not present

## 2014-07-04 DIAGNOSIS — G4700 Insomnia, unspecified: Secondary | ICD-10-CM | POA: Diagnosis not present

## 2014-07-04 DIAGNOSIS — Z7901 Long term (current) use of anticoagulants: Secondary | ICD-10-CM | POA: Insufficient documentation

## 2014-07-04 DIAGNOSIS — F1410 Cocaine abuse, uncomplicated: Secondary | ICD-10-CM | POA: Insufficient documentation

## 2014-07-04 DIAGNOSIS — F17200 Nicotine dependence, unspecified, uncomplicated: Secondary | ICD-10-CM | POA: Insufficient documentation

## 2014-07-04 DIAGNOSIS — Z86718 Personal history of other venous thrombosis and embolism: Secondary | ICD-10-CM | POA: Diagnosis not present

## 2014-07-04 DIAGNOSIS — N342 Other urethritis: Secondary | ICD-10-CM

## 2014-07-04 DIAGNOSIS — Z86711 Personal history of pulmonary embolism: Secondary | ICD-10-CM | POA: Insufficient documentation

## 2014-07-04 DIAGNOSIS — J449 Chronic obstructive pulmonary disease, unspecified: Secondary | ICD-10-CM | POA: Diagnosis not present

## 2014-07-04 DIAGNOSIS — F1010 Alcohol abuse, uncomplicated: Secondary | ICD-10-CM | POA: Diagnosis not present

## 2014-07-04 DIAGNOSIS — F328 Other depressive episodes: Secondary | ICD-10-CM

## 2014-07-04 DIAGNOSIS — Z1239 Encounter for other screening for malignant neoplasm of breast: Secondary | ICD-10-CM | POA: Insufficient documentation

## 2014-07-04 DIAGNOSIS — Z0000 Encounter for general adult medical examination without abnormal findings: Secondary | ICD-10-CM

## 2014-07-04 DIAGNOSIS — N390 Urinary tract infection, site not specified: Secondary | ICD-10-CM | POA: Diagnosis present

## 2014-07-04 DIAGNOSIS — F329 Major depressive disorder, single episode, unspecified: Secondary | ICD-10-CM | POA: Diagnosis not present

## 2014-07-04 DIAGNOSIS — F419 Anxiety disorder, unspecified: Secondary | ICD-10-CM

## 2014-07-04 DIAGNOSIS — G8929 Other chronic pain: Secondary | ICD-10-CM | POA: Diagnosis not present

## 2014-07-04 DIAGNOSIS — I2699 Other pulmonary embolism without acute cor pulmonale: Secondary | ICD-10-CM

## 2014-07-04 DIAGNOSIS — I82409 Acute embolism and thrombosis of unspecified deep veins of unspecified lower extremity: Secondary | ICD-10-CM

## 2014-07-04 DIAGNOSIS — F3289 Other specified depressive episodes: Secondary | ICD-10-CM

## 2014-07-04 DIAGNOSIS — M25512 Pain in left shoulder: Secondary | ICD-10-CM | POA: Diagnosis not present

## 2014-07-04 LAB — POCT URINALYSIS DIPSTICK
BILIRUBIN UA: NEGATIVE
Blood, UA: NEGATIVE
GLUCOSE UA: NEGATIVE
Ketones, UA: NEGATIVE
Nitrite, UA: POSITIVE
PH UA: 7
Protein, UA: NEGATIVE
Spec Grav, UA: 1.015
Urobilinogen, UA: 0.2

## 2014-07-04 MED ORDER — TRAZODONE HCL 100 MG PO TABS: 100.0000 mg | ORAL_TABLET | Freq: Every evening | ORAL | Status: DC | PRN

## 2014-07-04 MED ORDER — MIRTAZAPINE 15 MG PO TABS: 15.0000 mg | ORAL_TABLET | Freq: Every day | ORAL | Status: DC

## 2014-07-04 MED ORDER — ARIPIPRAZOLE 5 MG PO TABS: 5.0000 mg | ORAL_TABLET | Freq: Every day | ORAL | Status: DC

## 2014-07-04 MED ORDER — ACETAMINOPHEN-CODEINE #3 300-30 MG PO TABS: 1.0000 | ORAL_TABLET | ORAL | Status: DC | PRN

## 2014-07-04 MED ORDER — CIPROFLOXACIN HCL 500 MG PO TABS: 500.0000 mg | ORAL_TABLET | Freq: Two times a day (BID) | ORAL | Status: DC

## 2014-07-04 MED ORDER — WARFARIN SODIUM 5 MG PO TABS: 5.0000 mg | ORAL_TABLET | Freq: Every day | ORAL | Status: DC

## 2014-07-04 NOTE — Progress Notes (Signed)
Patient ID: Emily Cameron, female   DOB: 1961-03-11, 53 y.o.   MRN: [id number]   Emily Cameron, is a 53 y.o. female  [id number]  [id number]  DOB - 1961-01-01  Chief Complaint  Patient presents with  . Follow-up  . Urinary Tract Infection        Subjective:   Emily Cameron is a 53 y.o. female here today for a follow up visit. PMH significant PE/DVT on chronic anticoagulation, COPD, hyperlipidemia, depression, chronic L shoulder pain, and tobacco abuse.  She recently underwent removal of a mass from her vocal cords, which was determined to be benign.  She continues to smoke, although she expresses interest in stopping.  She reports that several days ago she was coughing and "coughed up a blood clot."  This was associated with SOB, similar to what she experienced with her previous PE.  She elected to restart her coumadin prior to her appointment today because she "knew that is what she needed."  She also complains of "tingling" with urination, and believes that she has a UTI.  She is having worsened pain in her left shoulder and is requesting pain medication.  She needs a refill of all of her medications as well.       No problems updated.  ALLERGIES: No Known Allergies  PAST MEDICAL HISTORY: Past Medical History  Diagnosis Date  . Anxiety   . Depression   . Insomnia   . Weight loss     due to anxiety  . PE (pulmonary embolism) 2006  . Alcohol abuse   . Polysubstance abuse     Current smoking and alcohol. 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 Marijuna not taking since 7 month.   Marland Kitchen COPD (chronic obstructive pulmonary disease)   . History of kidney stones     MEDICATIONS AT HOME: Prior to Admission medications   Medication Sig Start Date End Date Taking? Authorizing Provider  ARIPiprazole (ABILIFY) 5 MG tablet Take 1 tablet (5 mg total) by mouth daily. 04/25/14  Yes Tresa Garter, MD  megestrol (MEGACE) 400 MG/10ML suspension Take 400 mg by mouth 2 (two)  times a week.   Yes Historical Provider, MD  mirtazapine (REMERON) 15 MG tablet Take 1 tablet (15 mg total) by mouth at bedtime. 04/25/14  Yes Tresa Garter, MD  Tetrahydrozoline HCl (VISINE OP) Place 2 drops into both eyes as needed (for dry eyes).   Yes Historical Provider, MD  traZODone (DESYREL) 100 MG tablet Take 1 tablet (100 mg total) by mouth at bedtime as needed for sleep. 04/25/14  Yes Tresa Garter, MD  warfarin (COUMADIN) 5 MG tablet Take 1 tablet (5 mg total) by mouth daily at 6 PM. 07/02/14   Tresa Garter, MD   Review of Systems  Constitutional: Negative.   HENT: Negative.   Eyes: Negative.   Respiratory: Positive for cough, hemoptysis and shortness of breath. Negative for sputum production and wheezing.   Cardiovascular: Negative.   Gastrointestinal: Negative.   Genitourinary: Positive for dysuria.  Musculoskeletal: Positive for joint pain.  Skin: Negative.   Neurological: Negative.   Endo/Heme/Allergies: Negative.   Psychiatric/Behavioral: Negative.      Objective:   Filed Vitals:   07/04/14 1545  BP: 105/65  Pulse: 66  Temp: 99.3 F (37.4 C)  TempSrc: Oral  Resp: 14  Height: 5\' 2"  (1.575 m)  Weight: 130 lb (58.968 kg)  SpO2: 98%    Exam General appearance : Awake, alert, not  in any distress. Speech Clear. Not toxic looking HEENT: Atraumatic and Normocephalic, pupils equally reactive to light and accomodation Neck: supple, no JVD. No cervical lymphadenopathy.  Chest:Good air entry bilaterally, no added sounds  CVS: S1 S2 regular, no murmurs.  Abdomen: Bowel sounds present, Non tender and not distended with no gaurding, rigidity or rebound. Extremities: B/L Lower Ext shows no edema, both legs are warm to touch. Left shoulder with decreased ROM r/t pain. No deformities noted on palpation.  Neurology: Awake alert, and oriented X 3, CN II-XII intact, Non focal Skin:No Rash Wounds:N/A  Data Review No results found for: HGBA1C   Assessment  & Plan   1.UTI P:Urinalysis Dipstick Cipro 500 mg BID x 10 days  2. DVT (deep venous thrombosis), unspecified laterality  P: Restart coumadin at pts request.  We had an extensive discussion on the need for compliance with medication and follow up if we are to restart anticoagulation.  [name] verbalized understanding of this and agrees to follow her treatment recommendations.  3. Anxiety Refill aripipazole 5 mg daily  4. Depression Refill mirtazapine 15 mg daily  5. Insomnia Refill trazadone 100 mg nightly  6. Chronic shoulder pain P: Tylenol #3 1 tab every 4 hours as needed for pain  Follow up: weekly for coumadin checks  The patient was given clear instructions to go to ER or return to medical center if symptoms don't improve, worsen or new problems develop. The patient verbalized understanding. The patient was told to call to get lab results if they haven't heard anything in the next week.   This note has been created with Surveyor, quantity. Any transcriptional errors are unintentional.    [name], FNP-student  Evaluation and management procedures were performed by the Advanced Practitioner under my supervision and collaboration. I have reviewed the Advanced Practitioner's note and chart, and I agree with the management and plan.   [name], MD, [place], [place], [place] and [place], [place]   07/04/2014, 4:25 PM

## 2014-07-04 NOTE — Progress Notes (Signed)
Pt suspect that she may have a UTI. Pt had surgery on her throat and recently coughed up some blood. Pt reports having chronic pain in her left shoulder. Pt is still currently smoking.

## 2014-07-04 NOTE — Patient Instructions (Signed)
Smoking Cessation Quitting smoking is important to your health and has many advantages. However, it is not always easy to quit since nicotine is a very addictive drug. Oftentimes, people try 3 times or more before being able to quit. This document explains the best ways for you to prepare to quit smoking. Quitting takes hard work and a lot of effort, but you can do it. ADVANTAGES OF QUITTING SMOKING  You will live longer, feel better, and live better.  Your body will feel the impact of quitting smoking almost immediately.  Within 20 minutes, blood pressure decreases. Your pulse returns to its normal level.  After 8 hours, carbon monoxide levels in the blood return to normal. Your oxygen level increases.  After 24 hours, the chance of having a heart attack starts to decrease. Your breath, hair, and body stop smelling like smoke.  After 48 hours, damaged nerve endings begin to recover. Your sense of taste and smell improve.  After 72 hours, the body is virtually free of nicotine. Your bronchial tubes relax and breathing becomes easier.  After 2 to 12 weeks, lungs can hold more air. Exercise becomes easier and circulation improves.  The risk of having a heart attack, stroke, cancer, or lung disease is greatly reduced.  After 1 year, the risk of coronary heart disease is cut in half.  After 5 years, the risk of stroke falls to the same as a nonsmoker.  After 10 years, the risk of lung cancer is cut in half and the risk of other cancers decreases significantly.  After 15 years, the risk of coronary heart disease drops, usually to the level of a nonsmoker.  If you are pregnant, quitting smoking will improve your chances of having a healthy baby.  The people you live with, especially any children, will be healthier.  You will have extra money to spend on things other than cigarettes. QUESTIONS TO THINK ABOUT BEFORE ATTEMPTING TO QUIT You may want to talk about your answers with your  health care provider.  Why do you want to quit?  If you tried to quit in the past, what helped and what did not?  What will be the most difficult situations for you after you quit? How will you plan to handle them?  Who can help you through the tough times? Your family? Friends? A health care provider?  What pleasures do you get from smoking? What ways can you still get pleasure if you quit? Here are some questions to ask your health care provider:  How can you help me to be successful at quitting?  What medicine do you think would be best for me and how should I take it?  What should I do if I need more help?  What is smoking withdrawal like? How can I get information on withdrawal? GET READY  Set a quit date.  Change your environment by getting rid of all cigarettes, ashtrays, matches, and lighters in your home, car, or work. Do not let people smoke in your home.  Review your past attempts to quit. Think about what worked and what did not. GET SUPPORT AND ENCOURAGEMENT You have a better chance of being successful if you have help. You can get support in many ways.  Tell your family, friends, and coworkers that you are going to quit and need their support. Ask them not to smoke around you.  Get individual, group, or telephone counseling and support. Programs are available at local hospitals and health centers. Call   your local health department for information about programs in your area.  Spiritual beliefs and practices may help some smokers quit.  Download a "quit meter" on your computer to keep track of quit statistics, such as how long you have gone without smoking, cigarettes not smoked, and money saved.  Get a self-help book about quitting smoking and staying off tobacco. LEARN NEW SKILLS AND BEHAVIORS  Distract yourself from urges to smoke. Talk to someone, go for a walk, or occupy your time with a task.  Change your normal routine. Take a different route to work.  Drink tea instead of coffee. Eat breakfast in a different place.  Reduce your stress. Take a hot bath, exercise, or read a book.  Plan something enjoyable to do every day. Reward yourself for not smoking.  Explore interactive web-based programs that specialize in helping you quit. GET MEDICINE AND USE IT CORRECTLY Medicines can help you stop smoking and decrease the urge to smoke. Combining medicine with the above behavioral methods and support can greatly increase your chances of successfully quitting smoking.  Nicotine replacement therapy helps deliver nicotine to your body without the negative effects and risks of smoking. Nicotine replacement therapy includes nicotine gum, lozenges, inhalers, nasal sprays, and skin patches. Some may be available over-the-counter and others require a prescription.  Antidepressant medicine helps people abstain from smoking, but how this works is unknown. This medicine is available by prescription.  Nicotinic receptor partial agonist medicine simulates the effect of nicotine in your brain. This medicine is available by prescription. Ask your health care provider for advice about which medicines to use and how to use them based on your health history. Your health care provider will tell you what side effects to look out for if you choose to be on a medicine or therapy. Carefully read the information on the package. Do not use any other product containing nicotine while using a nicotine replacement product.  RELAPSE OR DIFFICULT SITUATIONS Most relapses occur within the first 3 months after quitting. Do not be discouraged if you start smoking again. Remember, most people try several times before finally quitting. You may have symptoms of withdrawal because your body is used to nicotine. You may crave cigarettes, be irritable, feel very hungry, cough often, get headaches, or have difficulty concentrating. The withdrawal symptoms are only temporary. They are strongest  when you first quit, but they will go away within 10-14 days. To reduce the chances of relapse, try to:  Avoid drinking alcohol. Drinking lowers your chances of successfully quitting.  Reduce the amount of caffeine you consume. Once you quit smoking, the amount of caffeine in your body increases and can give you symptoms, such as a rapid heartbeat, sweating, and anxiety.  Avoid smokers because they can make you want to smoke.  Do not let weight gain distract you. Many smokers will gain weight when they quit, usually less than 10 pounds. Eat a healthy diet and stay active. You can always lose the weight gained after you quit.  Find ways to improve your mood other than smoking. FOR MORE INFORMATION  www.smokefree.gov  Document Released: 08/03/2001 Document Revised: 12/24/2013 Document Reviewed: 11/18/2011 [employer] Patient Information 2015 [employer]. This information is not intended to replace advice given to you by your health care provider. Make sure you discuss any questions you have with your health care provider.  

## 2014-07-11 ENCOUNTER — Ambulatory Visit: Payer: Medicaid Other | Attending: Internal Medicine

## 2014-07-11 DIAGNOSIS — Z5181 Encounter for therapeutic drug level monitoring: Secondary | ICD-10-CM

## 2014-07-11 DIAGNOSIS — I82409 Acute embolism and thrombosis of unspecified deep veins of unspecified lower extremity: Secondary | ICD-10-CM

## 2014-07-11 DIAGNOSIS — Z7901 Long term (current) use of anticoagulants: Secondary | ICD-10-CM

## 2014-07-11 DIAGNOSIS — I2699 Other pulmonary embolism without acute cor pulmonale: Secondary | ICD-10-CM

## 2014-07-11 LAB — POCT INR: INR: 1.5

## 2014-07-24 ENCOUNTER — Telehealth: Payer: Self-pay

## 2014-07-24 NOTE — Telephone Encounter (Signed)
Patient called requesting a refill on her trazadone Prescription sent to pharmacy on file

## 2014-08-01 ENCOUNTER — Ambulatory Visit: Payer: Medicaid Other | Attending: Internal Medicine | Admitting: Pharmacist

## 2014-08-01 DIAGNOSIS — I82409 Acute embolism and thrombosis of unspecified deep veins of unspecified lower extremity: Secondary | ICD-10-CM

## 2014-08-01 LAB — POCT INR: INR: 1.2

## 2014-08-27 ENCOUNTER — Ambulatory Visit: Payer: Medicaid Other | Attending: Internal Medicine

## 2014-08-27 DIAGNOSIS — Z7901 Long term (current) use of anticoagulants: Secondary | ICD-10-CM

## 2014-08-27 DIAGNOSIS — I82409 Acute embolism and thrombosis of unspecified deep veins of unspecified lower extremity: Secondary | ICD-10-CM

## 2014-08-27 DIAGNOSIS — I2699 Other pulmonary embolism without acute cor pulmonale: Secondary | ICD-10-CM

## 2014-08-27 DIAGNOSIS — Z5181 Encounter for therapeutic drug level monitoring: Secondary | ICD-10-CM

## 2014-08-27 LAB — POCT INR: INR: 1.9

## 2014-09-12 ENCOUNTER — Ambulatory Visit (HOSPITAL_BASED_OUTPATIENT_CLINIC_OR_DEPARTMENT_OTHER): Payer: Medicaid Other | Admitting: Internal Medicine

## 2014-09-12 ENCOUNTER — Ambulatory Visit: Payer: Medicaid Other | Attending: Internal Medicine

## 2014-09-12 DIAGNOSIS — F328 Other depressive episodes: Secondary | ICD-10-CM

## 2014-09-12 DIAGNOSIS — Z5181 Encounter for therapeutic drug level monitoring: Secondary | ICD-10-CM

## 2014-09-12 DIAGNOSIS — Z7901 Long term (current) use of anticoagulants: Secondary | ICD-10-CM

## 2014-09-12 DIAGNOSIS — F3289 Other specified depressive episodes: Secondary | ICD-10-CM

## 2014-09-12 DIAGNOSIS — Z0000 Encounter for general adult medical examination without abnormal findings: Secondary | ICD-10-CM

## 2014-09-12 DIAGNOSIS — I2699 Other pulmonary embolism without acute cor pulmonale: Secondary | ICD-10-CM

## 2014-09-12 DIAGNOSIS — I82409 Acute embolism and thrombosis of unspecified deep veins of unspecified lower extremity: Secondary | ICD-10-CM

## 2014-09-12 LAB — POCT INR: INR: 1.1

## 2014-09-12 LAB — BASIC METABOLIC PANEL
BUN: 8 mg/dL (ref 6–23)
CO2: 25 mEq/L (ref 19–32)
CREATININE: 0.68 mg/dL (ref 0.50–1.10)
Calcium: 9 mg/dL (ref 8.4–10.5)
Chloride: 108 mEq/L (ref 96–112)
GLUCOSE: 77 mg/dL (ref 70–99)
Potassium: 3.1 mEq/L — ABNORMAL LOW (ref 3.5–5.3)
Sodium: 141 mEq/L (ref 135–145)

## 2014-09-12 LAB — HEPATIC FUNCTION PANEL
ALT: 9 U/L (ref 0–35)
AST: 14 U/L (ref 0–37)
Albumin: 3.8 g/dL (ref 3.5–5.2)
Alkaline Phosphatase: 82 U/L (ref 39–117)
BILIRUBIN DIRECT: 0.1 mg/dL (ref 0.0–0.3)
Indirect Bilirubin: 0.3 mg/dL (ref 0.2–1.2)
Total Bilirubin: 0.4 mg/dL (ref 0.2–1.2)
Total Protein: 7.2 g/dL (ref 6.0–8.3)

## 2014-09-12 LAB — TSH: TSH: 1.185 u[IU]/mL (ref 0.350–4.500)

## 2014-09-12 MED ORDER — TRAZODONE HCL 100 MG PO TABS: 100.0000 mg | ORAL_TABLET | Freq: Every evening | ORAL | Status: DC | PRN

## 2014-09-12 MED ORDER — RIVAROXABAN (XARELTO) VTE STARTER PACK (15 & 20 MG): ORAL_TABLET | ORAL | Status: DC

## 2014-09-12 MED ORDER — MIRTAZAPINE 15 MG PO TABS: 15.0000 mg | ORAL_TABLET | Freq: Every day | ORAL | Status: DC

## 2014-09-12 MED ORDER — ARIPIPRAZOLE 5 MG PO TABS: 5.0000 mg | ORAL_TABLET | Freq: Every day | ORAL | Status: DC

## 2014-09-12 MED ORDER — MEGESTROL ACETATE 400 MG/10ML PO SUSP: 400.0000 mg | ORAL | Status: DC

## 2014-09-12 NOTE — Progress Notes (Signed)
Patient ID: Emily Cameron, female   DOB: [birth year], 53 y.o.   MRN: [id number]   Emily Cameron, is a 54 y.o. female  [id number]  [id number]  DOB - 05/16/1961  No chief complaint on file.       Subjective:   Emily Cameron is a 54 y.o. female here today for a follow up visit. PMH significant PE/DVT on chronic anticoagulation, COPD, hyperlipidemia, depression, chronic L shoulder pain, and tobacco abuse. Patient came in today for INR check which is 1.1. Patient has been on Coumadin for anticoagulation for a long period of time,patient claims that she is compliant with the Coumadin but there is no record of her INR being therapeutic for the past 1 year. INR has been between 1.1 now 1.5 just by changes in Coumadin dosage weekly according to protocol. Patient continue to smoke cigarettes despite repeated counseling. She has no complaint today. Patient has not had colonoscopy, she did not have a mammogram for a long time. Patient has No headache, No chest pain, No abdominal pain - No Nausea, No new weakness tingling or numbness, No Cough - SOB.  No problems updated.  ALLERGIES: No Known Allergies  PAST MEDICAL HISTORY: Past Medical History  Diagnosis Date  . Anxiety   . Depression   . Insomnia   . Weight loss     due to anxiety  . PE (pulmonary embolism) 2006  . Alcohol abuse   . Polysubstance abuse     Current smoking and alcohol. 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 Marijuna not taking since 7 month.   Marland Kitchen COPD (chronic obstructive pulmonary disease)   . History of kidney stones     MEDICATIONS AT HOME: Prior to Admission medications   Medication Sig Start Date End Date Taking? Authorizing Provider  acetaminophen-codeine (TYLENOL #3) 300-30 MG per tablet Take 1 tablet by mouth every 4 (four) hours as needed. 07/04/14   Tresa Garter, MD  ARIPiprazole (ABILIFY) 5 MG tablet Take 1 tablet (5 mg total) by mouth daily. 09/12/14   Tresa Garter, MD    ciprofloxacin (CIPRO) 500 MG tablet Take 1 tablet (500 mg total) by mouth 2 (two) times daily. 07/04/14   Tresa Garter, MD  megestrol (MEGACE) 400 MG/10ML suspension Take 10 mLs (400 mg total) by mouth 2 (two) times a week. 09/12/14   Tresa Garter, MD  mirtazapine (REMERON) 15 MG tablet Take 1 tablet (15 mg total) by mouth at bedtime. 09/12/14   Tresa Garter, MD  Rivaroxaban (XARELTO STARTER PACK) 15 & 20 MG TBPK Take as directed on package: Start with one 15mg  tablet by mouth twice a day with food. On Day 22, switch to one 20mg  tablet once a day with food. 09/12/14   Tresa Garter, MD  Tetrahydrozoline HCl (VISINE OP) Place 2 drops into both eyes as needed (for dry eyes).    Historical Provider, MD  traZODone (DESYREL) 100 MG tablet Take 1 tablet (100 mg total) by mouth at bedtime as needed for sleep. 09/12/14   Tresa Garter, MD     Objective:   There were no vitals filed for this visit.  Exam General appearance : Awake, alert, not in any distress. Speech Clear. Not toxic looking, thin built HEENT: Atraumatic and Normocephalic, pupils equally reactive to light and accomodation Neck: supple, no JVD. No cervical lymphadenopathy.  Chest:Good air entry bilaterally, no added sounds  CVS: S1 S2 regular, no murmurs.  Abdomen: Bowel sounds  present, Non tender and not distended with no gaurding, rigidity or rebound. Extremities: B/L Lower Ext shows no edema, both legs are warm to touch Neurology: Awake alert, and oriented X 3, CN II-XII intact, Non focal   Data Review No results found for: HGBA1C   Assessment & Plan   1. Pulmonary embolism - Discontinue Coumadin, change anticoagulation to Xarelto - Hepatic Function Panel to get a baseline of liver function test before patient starts severally to Xarelto  - Basic Metabolic Panel - Rivaroxaban (XARELTO STARTER PACK) 15 & 20 MG TBPK; Take as directed on package: Start with one 15mg  tablet by mouth twice a day  with food. On Day 22, switch to one 20mg  tablet once a day with food.  Dispense: 51 each; Refill: 0  2. Preventative health care  - HM COLONOSCOPY - Ambulatory referral to Gastroenterology - MM Digital Screening; Future  3. Other depressive episodes Refill - ARIPiprazole (ABILIFY) 5 MG tablet; Take 1 tablet (5 mg total) by mouth daily.  Dispense: 30 tablet; Refill: 3 - traZODone (DESYREL) 100 MG tablet; Take 1 tablet (100 mg total) by mouth at bedtime as needed for sleep.  Dispense: 30 tablet; Refill: 3 - mirtazapine (REMERON) 15 MG tablet; Take 1 tablet (15 mg total) by mouth at bedtime.  Dispense: 30 tablet; Refill: 3 - megestrol (MEGACE) 400 MG/10ML suspension; Take 10 mLs (400 mg total) by mouth 2 (two) times a week.  Dispense: 480 mL; Refill: 3 - TSH   Patient was counseled extensively about nutrition and exercise Patient was again counseled extensively about smoking cessation   Return in about 4 weeks (around 10/10/2014) for Generalized Anxiety Disorder, Pulmonary Embolism and Anticoagulation.  The patient was given clear instructions to go to ER or return to medical center if symptoms don't improve, worsen or new problems develop. The patient verbalized understanding. The patient was told to call to get lab results if they haven't heard anything in the next week.   This note has been created with Surveyor, quantity. Any transcriptional errors are unintentional.    [name], MD, [place], [place], [place] and [place], [place]   09/12/2014, 12:40 PM

## 2014-09-12 NOTE — Progress Notes (Unsigned)
Pt comes in today for INR/Coumadin check Taking Coumadin 5mg  tab Stated light blood in stool 2 dys ago C/o sob/chest discomfort with breathing,nonradiating

## 2014-09-12 NOTE — Patient Instructions (Signed)
Rivaroxaban oral tablets °What is this medicine? °RIVAROXABAN (ri va ROX a ban) is an anticoagulant (blood thinner). It is used to treat blood clots in the lungs or in the veins. It is also used after knee or hip surgeries to prevent blood clots. It is also used to lower the chance of stroke in people with a medical condition called atrial fibrillation. °This medicine may be used for other purposes; ask your health care provider or pharmacist if you have questions. °COMMON BRAND NAME(S): Xarelto, Xarelto Starter Pack °What should I tell my health care provider before I take this medicine? °They need to know if you have any of these conditions: °-bleeding disorders °-bleeding in the brain °-blood in your stools (black or tarry stools) or if you have blood in your vomit °-history of stomach bleeding °-kidney disease °-liver disease °-low blood counts, like low white cell, platelet, or red cell counts °-recent or planned spinal or epidural procedure °-take medicines that treat or prevent blood clots °-an unusual or allergic reaction to rivaroxaban, other medicines, foods, dyes, or preservatives °-pregnant or trying to get pregnant °-breast-feeding °How should I use this medicine? °Take this medicine by mouth with a glass of water. Follow the directions on the prescription label. Take your medicine at regular intervals. Do not take it more often than directed. Do not stop taking except on your doctor's advice. Stopping this medicine may increase your risk of a blot clot. Be sure to refill your prescription before you run out of medicine. °If you are taking this medicine after hip or knee replacement surgery, take it with or without food. If you are taking this medicine for atrial fibrillation, take it with your evening meal. If you are taking this medicine to treat blood clots, take it with food at the same time each day. If you are unable to swallow your tablet, you may crush the tablet and mix it in applesauce. Then,  immediately eat the applesauce. You should eat more food right after you eat the applesauce containing the crushed tablet. °Talk to your pediatrician regarding the use of this medicine in children. Special care may be needed. °Overdosage: If you think you have taken too much of this medicine contact a poison control center or emergency room at once. °NOTE: This medicine is only for you. Do not share this medicine with others. °What if I miss a dose? °If you take your medicine once a day and miss a dose, take the missed dose as soon as you remember. If you take your medicine twice a day and miss a dose, take the missed dose immediately. In this instance, 2 tablets may be taken at the same time. The next day you should take 1 tablet twice a day as directed. °What may interact with this medicine? °-aspirin and aspirin-like medicines °-certain antibiotics like erythromycin, azithromycin, and clarithromycin °-certain medicines for fungal infections like ketoconazole and itraconazole °-certain medicines for irregular heart beat like amiodarone, quinidine, dronedarone °-certain medicines for seizures like carbamazepine, phenytoin °-certain medicines that treat or prevent blood clots like warfarin, enoxaparin, and dalteparin °-conivaptan °-diltiazem °-felodipine °-indinavir °-lopinavir; ritonavir °-NSAIDS, medicines for pain and inflammation, like ibuprofen or naproxen °-ranolazine °-rifampin °-ritonavir °-St. John's wort °-verapamil °This list may not describe all possible interactions. Give your health care provider a list of all the medicines, herbs, non-prescription drugs, or dietary supplements you use. Also tell them if you smoke, drink alcohol, or use illegal drugs. Some items may interact with your medicine. °What   should I watch for while using this medicine? °Visit your doctor or health care professional for regular checks on your progress. Your condition will be monitored carefully while you are receiving this  medicine. °Notify your doctor or health care professional and seek emergency treatment if you develop breathing problems; changes in vision; chest pain; severe, sudden headache; pain, swelling, warmth in the leg; trouble speaking; sudden numbness or weakness of the face, arm, or leg. These can be signs that your condition has gotten worse. °If you are going to have surgery, tell your doctor or health care professional that you are taking this medicine. °Tell your health care professional that you use this medicine before you have a spinal or epidural procedure. Sometimes people who take this medicine have bleeding problems around the spine when they have a spinal or epidural procedure. This bleeding is very rare. If you have a spinal or epidural procedure while on this medicine, call your health care professional immediately if you have back pain, numbness or tingling (especially in your legs and feet), muscle weakness, paralysis, or loss of bladder or bowel control. °Avoid sports and activities that might cause injury while you are using this medicine. Severe falls or injuries can cause unseen bleeding. Be careful when using sharp tools or knives. Consider using an electric razor. Take special care brushing or flossing your teeth. Report any injuries, bruising, or red spots on the skin to your doctor or health care professional. °What side effects may I notice from receiving this medicine? °Side effects that you should report to your doctor or health care professional as soon as possible: °-allergic reactions like skin rash, itching or hives, swelling of the face, lips, or tongue °-back pain °-redness, blistering, peeling or loosening of the skin, including inside the mouth °-signs and symptoms of bleeding such as bloody or black, tarry stools; red or dark-brown urine; spitting up blood or brown material that looks like coffee grounds; red spots on the skin; unusual bruising or bleeding from the eye, gums, or  nose °Side effects that usually do not require medical attention (Report these to your doctor or health care professional if they continue or are bothersome.): °-dizziness °-muscle pain °This list may not describe all possible side effects. Call your doctor for medical advice about side effects. You may report side effects to FDA at 1-800-FDA-1088. °Where should I keep my medicine? °Keep out of the reach of children. °Store at room temperature between 15 and 30 degrees C (59 and 86 degrees F). Throw away any unused medicine after the expiration date. °NOTE: This sheet is a summary. It may not cover all possible information. If you have questions about this medicine, talk to your doctor, pharmacist, or health care provider. °© 2015, [employer]. (2013-11-29 18:47:48) ° °

## 2014-09-16 ENCOUNTER — Telehealth: Payer: Self-pay | Admitting: Emergency Medicine

## 2014-09-16 MED ORDER — POTASSIUM CHLORIDE ER 10 MEQ PO TBCR: 10.0000 meq | EXTENDED_RELEASE_TABLET | Freq: Every day | ORAL | Status: DC

## 2014-09-16 NOTE — Telephone Encounter (Signed)
Left message with lab results and medication instructions to start prescribed Potassium Chloride 10 meq daily with repeat lab appt Medication e-scribed to [place]

## 2014-09-16 NOTE — Telephone Encounter (Signed)
-----   Message from Tresa Garter, MD sent at 09/16/2014  9:45 AM EST ----- Patient patient that her laboratory test results are mostly within normal limits except for potassium level which is low, looks like this has been going on for more than a year Please call in prescription potassium chloride 10 mEq tablet by mouth daily, 30 tablets with 3 refills, please schedule patient for repeat potassium in one month.

## 2014-11-14 ENCOUNTER — Ambulatory Visit: Payer: Self-pay | Admitting: Internal Medicine

## 2014-12-02 ENCOUNTER — Ambulatory Visit: Payer: Self-pay | Admitting: Internal Medicine

## 2014-12-02 ENCOUNTER — Ambulatory Visit: Payer: Medicaid Other | Attending: Internal Medicine

## 2014-12-02 ENCOUNTER — Ambulatory Visit (HOSPITAL_BASED_OUTPATIENT_CLINIC_OR_DEPARTMENT_OTHER): Payer: Medicaid Other | Admitting: Internal Medicine

## 2014-12-02 ENCOUNTER — Encounter: Payer: Self-pay | Admitting: Internal Medicine

## 2014-12-02 VITALS — BP 107/68 | HR 85 | Temp 97.8°F | Ht 63.0 in | Wt 122.8 lb

## 2014-12-02 DIAGNOSIS — F3289 Other specified depressive episodes: Secondary | ICD-10-CM

## 2014-12-02 DIAGNOSIS — F328 Other depressive episodes: Secondary | ICD-10-CM | POA: Diagnosis not present

## 2014-12-02 DIAGNOSIS — I2699 Other pulmonary embolism without acute cor pulmonale: Secondary | ICD-10-CM

## 2014-12-02 MED ORDER — WARFARIN SODIUM 5 MG PO TABS: 5.0000 mg | ORAL_TABLET | Freq: Every day | ORAL | Status: DC

## 2014-12-02 MED ORDER — MIRTAZAPINE 15 MG PO TABS: 15.0000 mg | ORAL_TABLET | Freq: Every day | ORAL | Status: DC

## 2014-12-02 MED ORDER — ARIPIPRAZOLE 5 MG PO TABS: 5.0000 mg | ORAL_TABLET | Freq: Every day | ORAL | Status: DC

## 2014-12-02 MED ORDER — TRAZODONE HCL 100 MG PO TABS: 100.0000 mg | ORAL_TABLET | Freq: Every evening | ORAL | Status: DC | PRN

## 2014-12-02 NOTE — Progress Notes (Signed)
Patient ID: Emily Cameron, female   DOB: [birth year], 54 y.o.   MRN: [id number]   Emily Cameron, is a 54 y.o. female  [id number]  [id number]  DOB - 1961/02/16  No chief complaint on file.       Subjective:   Emily Cameron is a 54 y.o. female here today for a follow up visit. Patient has history of pulmonary embolism on chronic anticoagulation, COPD, generalized anxiety and depression, remote polysubstance abuse history. Patient is here today for follow-up visit and for INR check. She is complaining of pain depressed, tired of her general situations, medically, physically and emotionally. She denies any suicidal ideation or thoughts. She would like to restart her medications for depression. Patient has No headache, No chest pain, No abdominal pain - No Nausea, No new weakness tingling or numbness, No Cough - SOB.  No problems updated.  ALLERGIES: No Known Allergies  PAST MEDICAL HISTORY: Past Medical History  Diagnosis Date  . Anxiety   . Depression   . Insomnia   . Weight loss     due to anxiety  . PE (pulmonary embolism) 2006  . Alcohol abuse   . Polysubstance abuse     Current smoking and alcohol. 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 Marijuna not taking since 7 month.   Marland Kitchen COPD (chronic obstructive pulmonary disease)   . History of kidney stones     MEDICATIONS AT HOME: Prior to Admission medications   Medication Sig Start Date End Date Taking? Authorizing Provider  ARIPiprazole (ABILIFY) 5 MG tablet Take 1 tablet (5 mg total) by mouth daily. 12/02/14  Yes Tresa Garter, MD  megestrol (MEGACE) 400 MG/10ML suspension Take 10 mLs (400 mg total) by mouth 2 (two) times a week. 09/12/14  Yes Tresa Garter, MD  mirtazapine (REMERON) 15 MG tablet Take 1 tablet (15 mg total) by mouth at bedtime. 12/02/14  Yes Tresa Garter, MD  potassium chloride (K-DUR) 10 MEQ tablet Take 1 tablet (10 mEq total) by mouth daily. 09/16/14  Yes Tresa Garter,  MD  Tetrahydrozoline HCl (VISINE OP) Place 2 drops into both eyes as needed (for dry eyes).   Yes Historical Provider, MD  traZODone (DESYREL) 100 MG tablet Take 1 tablet (100 mg total) by mouth at bedtime as needed for sleep. 12/02/14  Yes Tresa Garter, MD  acetaminophen-codeine (TYLENOL #3) 300-30 MG per tablet Take 1 tablet by mouth every 4 (four) hours as needed. Patient not taking: Reported on 12/02/2014 07/04/14   Tresa Garter, MD  warfarin (COUMADIN) 5 MG tablet Take 1 tablet (5 mg total) by mouth daily. 12/02/14   Tresa Garter, MD     Objective:   Filed Vitals:   12/02/14 1201  BP: 107/68  Pulse: 85  Temp: 97.8 F (36.6 C)  TempSrc: Oral  Height: 5\' 3"  (1.6 m)  Weight: 122 lb 12.8 oz (55.702 kg)  SpO2: 99%    Exam General appearance : Awake, alert, not in any distress. Speech Clear. Not toxic looking HEENT: Atraumatic and Normocephalic, pupils equally reactive to light and accomodation Neck: supple, no JVD. No cervical lymphadenopathy.  Chest:Good air entry bilaterally, no added sounds  CVS: S1 S2 regular, no murmurs.  Abdomen: Bowel sounds present, Non tender and not distended with no gaurding, rigidity or rebound. Extremities: B/L Lower Ext shows no edema, both legs are warm to touch Neurology: Awake alert, and oriented X 3, CN II-XII intact, Non focal Skin:No Rash  Data Review No results found for: HGBA1C   Assessment & Plan   1. Pulmonary embolism on Chronic Anticoagulation  - warfarin (COUMADIN) 5 MG tablet; Take 1 tablet (5 mg total) by mouth daily.  Dispense: 30 tablet; Refill: 3  2. Other depressive episodes  - ARIPiprazole (ABILIFY) 5 MG tablet; Take 1 tablet (5 mg total) by mouth daily.  Dispense: 30 tablet; Refill: 3 - mirtazapine (REMERON) 15 MG tablet; Take 1 tablet (15 mg total) by mouth at bedtime.  Dispense: 30 tablet; Refill: 3 - traZODone (DESYREL) 100 MG tablet; Take 1 tablet (100 mg total) by mouth at bedtime as needed for  sleep.  Dispense: 30 tablet; Refill: 3  Patient was seen by licensed clinical social worker for counseling.  Patient have been counseled extensively about nutrition and exercise Return in about 1 week (around 12/09/2014) for INR Check.  The patient was given clear instructions to go to ER or return to medical center if symptoms don't improve, worsen or new problems develop. The patient verbalized understanding. The patient was told to call to get lab results if they haven't heard anything in the next week.   This note has been created with Surveyor, quantity. Any transcriptional errors are unintentional.    [name], MD, [place], [place], [place], [place] and [place], [place]   12/02/2014, 12:16 PM

## 2014-12-02 NOTE — Progress Notes (Signed)
Patient is here for a follow up visit and INR.

## 2014-12-02 NOTE — Patient Instructions (Addendum)
Warfarin: What You Need to Know Warfarin is an anticoagulant. Anticoagulants help prevent the formation of blood clots. They also help stop the growth of blood clots. Warfarin is sometimes referred to as a "blood thinner."  Normally, when body tissues are cut or damaged, the blood clots in order to prevent blood loss. Sometimes clots form inside your blood vessels and obstruct the flow of blood through your circulatory system (thrombosis). These clots may travel through your bloodstream and become lodged in smaller blood vessels in your brain, which can cause a stroke, or in your lungs (pulmonary embolism). WHO SHOULD USE WARFARIN? Warfarin is prescribed for people at risk of developing harmful blood clots:  People with surgically implanted mechanical heart valves, irregular heart rhythms called atrial fibrillation, and certain clotting disorders.  People who have developed harmful blood clotting in the past, including those who have had a stroke or a pulmonary embolism, or thrombosis in their legs (deep vein thrombosis [DVT]).  People with an existing blood clot, such as a pulmonary embolism. WARFARIN DOSING Warfarin tablets come in different strengths. Each tablet strength is a different color, with the amount of warfarin (in milligrams) clearly printed on the tablet. If the color of your tablet is different than usual when you receive a new prescription, report it immediately to your pharmacist or health care provider. WARFARIN MONITORING The goal of warfarin therapy is to lessen the clotting tendency of blood but not prevent clotting completely. Your health care provider will monitor the anticoagulation effect of warfarin closely and adjust your dose as needed. For your safety, blood tests called prothrombin time (PT) or international normalized ratio (INR) are used to measure the effects of warfarin. Both of these tests can be done with a finger stick or a blood draw. The longer it takes the  blood to clot, the higher the PT or INR. Your health care provider will inform you of your "target" PT or INR range. If, at any time, your PT or INR is above the target range, there is a risk of bleeding. If your PT or INR is below the target range, there is a risk of clotting. Whether you are started on warfarin while you are in the hospital or in your health care provider's office, you will need to have your PT or INR checked within one week of starting the medicine. Initially, some people are asked to have their PT or INR checked as much as twice a week. Once you are on a stable maintenance dose, the PT or INR is checked less often, usually once every 2 to 4 weeks. The warfarin dose may be adjusted if the PT or INR is not within the target range. It is important to keep all laboratory and health care provider follow-up appointments. Not keeping appointments could result in a chronic or permanent injury, pain, or disability because warfarin is a medicine that requires close monitoring. WHAT ARE THE SIDE EFFECTS OF WARFARIN?  Too much warfarin can cause bleeding (hemorrhage) from any part of the body. This may include bleeding from the gums, blood in the urine, bloody or dark stools, a nosebleed that is not easily stopped, coughing up blood, or vomiting blood.  Too little warfarin can increase the risk of blood clots.  Too little or too much warfarin can also increase the risk of a stroke.  Warfarin use may cause a skin rash or irritation, an unusual fever, continual nausea or stomach upset, or severe pain in your joints or back.   SPECIAL PRECAUTIONS WHILE TAKING WARFARIN Warfarin should be taken exactly as directed. It is very important to take warfarin as directed since bleeding or blood clots could result in chronic or permanent injury, pain, or disability.  Take your medicine at the same time every day. If you forget to take your dose, you can take it if it is within 6 hours of when it was  due.  Do not change the dose of warfarin on your own to make up for missed or extra doses.  If you miss more than 2 doses in a row, you should contact your health care provider for advice. Avoid situations that cause bleeding. You may have a tendency to bleed more easily than usual while taking warfarin. The following actions can limit bleeding:  Using a softer toothbrush.  Flossing with waxed floss rather than unwaxed floss.  Shaving with an electric razor rather than a blade.  Limiting the use of sharp objects.  Avoiding potentially harmful activities, such as contact sports. Warfarin and Pregnancy or Breastfeeding  Warfarin is not advised during the first trimester of pregnancy due to an increased risk of birth defects. In certain situations, a woman may take warfarin after her first trimester of pregnancy. A woman who becomes pregnant or plans to become pregnant while taking warfarin should notify her health care provider immediately.  Although warfarin does not pass into breast milk, a woman who wishes to breastfeed while taking warfarin should also consult with her health care provider. Alcohol, Smoking, and Illicit Drug Use  Alcohol affects how warfarin works in the body. It is best to avoid alcoholic drinks or consume very small amounts while taking warfarin. In general, alcohol intake should be limited to 1 oz (30 mL) of liquor, 6 oz (180 mL) of wine, or 12 oz (360 mL) of beer each day. Notify your health care provider if you change your alcohol intake.  Smoking affects how warfarin works. It is best to avoid smoking while taking warfarin. Notify your health care provider if you change your smoking habits.  It is best to avoid all illicit drugs while taking warfarin since there are few studies that show how warfarin interacts with these drugs. Other Medicines and Dietary Supplements Many prescription and over-the-counter medicines can interfere with warfarin. Be sure all of your  health care providers know you are taking warfarin. Notify your health care provider who prescribed warfarin for you or your pharmacist before starting or stopping any new medicines, including over-the-counter vitamins, dietary supplements, and pain medicines. Your warfarin dose may need to be adjusted. Some common over-the-counter medicines that may increase the risk of bleeding while taking warfarin include:   Acetaminophen.  Aspirin.  Nonsteroidal anti-inflammatory medicines (NSAIDs), such as ibuprofen or naproxen.  Vitamin E. Dietary Considerations  Foods that have moderate or high amounts of vitamin K can interfere with warfarin. Avoid major changes in your diet or notify your health care provider before changing your diet. Eat a consistent amount of foods that have moderate or high amounts of vitamin K. Eating less foods containing vitamin K can increase the risk of bleeding. Eating more foods containing vitamin K can increase the risk of blood clots. Additional questions about dietary considerations can be discussed with a dietitian. Foods that are very high in vitamin K:  Greens, such as Swiss chard and beet, collard, mustard, or turnip greens (fresh or frozen, cooked).  Kale (fresh or frozen, cooked).  Parsley (raw).  Spinach (cooked). Foods that are high   in vitamin K:  Asparagus (frozen, cooked).  Beans, green (frozen, cooked).  Broccoli.  Bok choy (cooked).  Brussels sprouts (fresh or frozen, cooked).  Cabbage (cooked).   Coleslaw. Foods that are moderately high in vitamin K:  Blueberries.  Black-eyed peas.  Endive (raw).  Green leaf lettuce (raw).  Green scallions (raw).  Kale (raw).  Okra (frozen, cooked).  Plantains (fried).  Romaine lettuce (raw).  Sauerkraut (canned).  Spinach (raw). CALL YOUR CLINIC OR HEALTH CARE PROVIDER IF YOU:  Plan to have any surgery or procedure.  Feel sick, especially if you have diarrhea or  vomiting.  Experience or anticipate any major changes in your diet.  Start or stop a prescription or over-the-counter medicine.  Become, plan to become, or think you may be pregnant.  Are having heavier than usual menstrual periods.  Have had a fall, accident, or any symptoms of bleeding or unusual bruising.  Develop an unusual fever. CALL 911 IN THE [place] OR GO TO THE EMERGENCY DEPARTMENT IF YOU:   Think you may be having an allergic reaction to warfarin. The signs of an allergic reaction could include itching, rash, hives, swelling, chest tightness, or trouble breathing.  See signs of blood in your urine. The signs could include reddish, pinkish, or tea-colored urine.  See signs of blood in your stools. The signs could include bright red or black stools.  Vomit or cough up blood. In these instances, the blood could have either a bright red or a "coffee-grounds" appearance.  Have bleeding that will not stop after applying pressure for 30 minutes such as cuts, nosebleeds, or other injuries.  Have severe pain in your joints or back.  Have a new and severe headache.  Have sudden weakness or numbness of your face, arm, or leg, especially on one side of your body.  Have sudden confusion or trouble understanding.  Have sudden trouble seeing in one or both eyes.  Have sudden trouble walking, dizziness, loss of balance, or coordination.  Have trouble speaking or understanding (aphasia). Document Released: 08/09/2005 Document Revised: 12/24/2013 Document Reviewed: 02/02/2013 [facility] Patient Information 2015 [place], [place]. This information is not intended to replace advice given to you by your health care provider. Make sure you discuss any questions you have with your health care provider. Prothrombin Time, International Normalized Ratio A Prothrombin Time (Protime, PT) measures the time that it takes for your blood to clot. The International Normalized Ratio (INR) is a calculation  of blood clotting time based upon your PT result. Most laboratories report both PT and INR values when reporting blood clotting times. The PT is used to determine: Certain medical conditions that can cause bleeding disorders. These can include: Liver disease. Systemic infection (sepsis). Inherited (genetic) bleeding disorders. The appropriate dose of warfarin (Coumadin) needed to treat various conditions that require the use of a blood thinner (anticoagulant). There is no standard warfarin dose. Each person is unique and will require their own warfarin dose based on PT and INR levels. An INR should only be used to determine the appropriate dose of an anticoagulant. It is important to know that: PT and INR levels are checked per your caregiver's recommendations. It is very important to keep your PT and INR lab appointments. Not keeping the appointments could result in a chronic or permanent injury, pain, and disability. If there is any problem keeping the appointment, you must call your caregiver. PT and INR levels can be affected by certain foods you eat. It is important to notify  your caregiver if you have a change in your diet. PT and INR levels can be affected by some medications. It is important when taking new prescriptions or over-the-counter medications that you notify your caregiver. Different medical conditions may require different PT and INR levels. For example, a mechanical heart valve may require a higher INR level than those being treated for a clot in the leg. PREPARATION FOR TEST A blood sample is obtained by inserting a needle into a vein in the arm. If you are on warfarin therapy, the blood sample should be obtained before taking your daily dose. NORMAL FINDINGS A normal PT is 10 to 13 seconds. A normal INR level (without anticoagulant therapy) is 0.9 to 1.1. INR levels (with anticoagulant therapy) generally range from 2 to 3.5 depending on the medical condition that warfarin is  being used to treat. INR levels greater than 3.5 mean that it is taking too long to form a blood clot. Ranges for normal findings may vary among different laboratories and hospitals. You should always check with your doctor after having lab work or other tests done to discuss the meaning of your test results and whether your values are considered within normal limits. MEANING OF TEST  Your caregiver will go over the test results with you and discuss the importance and meaning of your results, as well as treatment options and the need for additional tests if necessary. OBTAINING THE TEST RESULTS It is your responsibility to obtain your test results. Ask the lab or department performing the test when and how you will get your results. SEEK IMMEDIATE MEDICAL CARE IF:  You develop bleeding from the nose, mouth, or gums. You vomit or cough up blood. Your bowel movements are bloody or black in color. You have a severe headache that does not go away. You skin bruises easily. Document Released: 09/11/2004 Document Revised: 11/01/2011 Document Reviewed: 07/21/2008 [facility] Patient Information 2015 [place], [place]. This information is not intended to replace advice given to you by your health care provider. Make sure you discuss any questions you have with your health care provider.

## 2014-12-12 ENCOUNTER — Other Ambulatory Visit: Payer: Self-pay

## 2014-12-12 ENCOUNTER — Ambulatory Visit: Payer: Medicaid Other | Attending: Internal Medicine | Admitting: Pharmacist

## 2014-12-12 DIAGNOSIS — Z5181 Encounter for therapeutic drug level monitoring: Secondary | ICD-10-CM

## 2014-12-12 DIAGNOSIS — I2699 Other pulmonary embolism without acute cor pulmonale: Secondary | ICD-10-CM | POA: Diagnosis not present

## 2014-12-12 DIAGNOSIS — Z7901 Long term (current) use of anticoagulants: Secondary | ICD-10-CM

## 2014-12-12 LAB — POCT INR: INR: 1

## 2014-12-12 NOTE — Progress Notes (Signed)
The patient states that she has had financial struggles and has not been able to obtain her medication.  She has been off her warfarin for 1 month.  I talked with Dr. Doreene Burke and we discussed placing the patient on Xarelto or Eliquis.  After talking with the patient, she is not willing to start on either of these medications.  She wants to stay on her warfarin.  I explained and reiterated to her that she has to take this medication each day and come to her appts.  She said that she understood.

## 2014-12-17 ENCOUNTER — Ambulatory Visit: Payer: Medicaid Other | Attending: Internal Medicine | Admitting: Pharmacist

## 2014-12-17 DIAGNOSIS — I2699 Other pulmonary embolism without acute cor pulmonale: Secondary | ICD-10-CM | POA: Diagnosis not present

## 2014-12-17 DIAGNOSIS — Z5181 Encounter for therapeutic drug level monitoring: Secondary | ICD-10-CM

## 2014-12-17 DIAGNOSIS — Z7901 Long term (current) use of anticoagulants: Secondary | ICD-10-CM

## 2014-12-17 LAB — POCT INR: INR: 5.2

## 2014-12-19 ENCOUNTER — Ambulatory Visit: Payer: Medicaid Other | Attending: Internal Medicine | Admitting: Pharmacist

## 2014-12-19 DIAGNOSIS — Z7901 Long term (current) use of anticoagulants: Secondary | ICD-10-CM

## 2014-12-19 DIAGNOSIS — I2699 Other pulmonary embolism without acute cor pulmonale: Secondary | ICD-10-CM

## 2014-12-19 DIAGNOSIS — Z5181 Encounter for therapeutic drug level monitoring: Secondary | ICD-10-CM

## 2014-12-19 LAB — POCT INR: INR: 3.2

## 2014-12-31 ENCOUNTER — Ambulatory Visit: Payer: Medicaid Other | Attending: Internal Medicine | Admitting: Pharmacist

## 2014-12-31 DIAGNOSIS — Z7901 Long term (current) use of anticoagulants: Secondary | ICD-10-CM | POA: Diagnosis not present

## 2014-12-31 DIAGNOSIS — Z5181 Encounter for therapeutic drug level monitoring: Secondary | ICD-10-CM

## 2014-12-31 DIAGNOSIS — I2699 Other pulmonary embolism without acute cor pulmonale: Secondary | ICD-10-CM | POA: Diagnosis not present

## 2014-12-31 LAB — POCT INR: INR: 1.9

## 2015-01-14 ENCOUNTER — Telehealth: Payer: Self-pay | Admitting: General Practice

## 2015-01-14 NOTE — Telephone Encounter (Signed)
Today, 01/14/15, I received a medical record request for this patient from [place]. I released patients records as requested. However page three of the request requires attention from PCP. I noted the form and placed in providers box. Please sign and return to me so that I can fax it back to disability. Thanks.

## 2015-01-16 ENCOUNTER — Ambulatory Visit: Payer: Medicaid Other | Attending: Internal Medicine | Admitting: Pharmacist

## 2015-01-16 DIAGNOSIS — Z7901 Long term (current) use of anticoagulants: Secondary | ICD-10-CM | POA: Diagnosis not present

## 2015-01-16 DIAGNOSIS — I2699 Other pulmonary embolism without acute cor pulmonale: Secondary | ICD-10-CM | POA: Diagnosis not present

## 2015-01-16 DIAGNOSIS — Z5181 Encounter for therapeutic drug level monitoring: Secondary | ICD-10-CM

## 2015-01-16 LAB — POCT INR: INR: 2.4

## 2015-01-28 NOTE — Telephone Encounter (Signed)
I asked Dr. Doreene Burke about patient's paperwork and he said he would work on it.

## 2015-02-06 ENCOUNTER — Ambulatory Visit: Payer: Medicaid Other | Attending: Internal Medicine | Admitting: Pharmacist

## 2015-02-06 DIAGNOSIS — I2699 Other pulmonary embolism without acute cor pulmonale: Secondary | ICD-10-CM | POA: Diagnosis not present

## 2015-02-06 DIAGNOSIS — Z5181 Encounter for therapeutic drug level monitoring: Secondary | ICD-10-CM

## 2015-02-06 DIAGNOSIS — Z7901 Long term (current) use of anticoagulants: Secondary | ICD-10-CM

## 2015-02-06 LAB — POCT INR: INR: 3

## 2015-03-06 ENCOUNTER — Ambulatory Visit: Payer: Medicaid Other | Attending: Internal Medicine | Admitting: Pharmacist

## 2015-03-06 DIAGNOSIS — Z5181 Encounter for therapeutic drug level monitoring: Secondary | ICD-10-CM

## 2015-03-06 DIAGNOSIS — Z7901 Long term (current) use of anticoagulants: Principal | ICD-10-CM

## 2015-03-06 DIAGNOSIS — I2699 Other pulmonary embolism without acute cor pulmonale: Secondary | ICD-10-CM

## 2015-03-06 LAB — POCT INR: INR: 5.5

## 2015-03-06 NOTE — Patient Instructions (Signed)
Your INR is high today meaning that your blood is thinner than we want it to be.  Do not take your dose tonight (03/06/15) or tomorrow (03/07/15), and then take 1/2 tablet on Saturday, Sunday, and Monday.   Come back to see me on Tuesday 03/11/15

## 2015-03-11 ENCOUNTER — Ambulatory Visit: Payer: Medicaid Other | Attending: Internal Medicine | Admitting: Pharmacist

## 2015-03-11 DIAGNOSIS — Z7901 Long term (current) use of anticoagulants: Secondary | ICD-10-CM

## 2015-03-11 DIAGNOSIS — Z5181 Encounter for therapeutic drug level monitoring: Secondary | ICD-10-CM

## 2015-03-11 DIAGNOSIS — I2699 Other pulmonary embolism without acute cor pulmonale: Secondary | ICD-10-CM

## 2015-03-11 LAB — POCT INR: INR: 1.4

## 2015-03-11 NOTE — Patient Instructions (Signed)
Take 1.5 (one and a half tablet or 7.5 mg) tonight and then start back taking 1 tablet every day but 0.5 (1/2) tablets on Thursdays.  I will see you next week.  Please let us know if you have an shortness of breath or leg pain/swelling

## 2015-03-11 NOTE — Progress Notes (Signed)
Patient seen in [facility] after elevated INR last week.  She did not resume her warfarin as instructed because she was visiting her parents and was drinking a lot of alcohol. She reports that she does not take her warfarin when she drinks due to fear of bleeding.  I again discussed with her the use of Xarelto or Eliquis due to convenience of dosing and improved compliance but patient refused and said that those medications "almost killed" her. She reports that she will only take warfarin and no other anticoagulants.  Patient reported eating more this past week, including intake of Ensure.   Stressed the importance of compliance with warfarin, as it will prevent future clots.  Patient denied chest pain, shortness of breath, leg swelling, or leg pain. Encouraged patient to contact us if any of these symptoms develop.

## 2015-03-18 ENCOUNTER — Encounter: Payer: Self-pay | Admitting: Pharmacist

## 2015-03-20 ENCOUNTER — Ambulatory Visit: Payer: Medicaid Other | Attending: Internal Medicine | Admitting: Pharmacist

## 2015-03-20 DIAGNOSIS — Z5181 Encounter for therapeutic drug level monitoring: Secondary | ICD-10-CM

## 2015-03-20 DIAGNOSIS — Z7901 Long term (current) use of anticoagulants: Principal | ICD-10-CM

## 2015-03-20 DIAGNOSIS — I2699 Other pulmonary embolism without acute cor pulmonale: Secondary | ICD-10-CM

## 2015-03-20 LAB — POCT INR: INR: 2.3

## 2015-03-27 ENCOUNTER — Ambulatory Visit: Payer: Self-pay | Admitting: Internal Medicine

## 2015-04-01 ENCOUNTER — Ambulatory Visit: Payer: Medicaid Other | Attending: Internal Medicine | Admitting: Pharmacist

## 2015-04-01 DIAGNOSIS — Z7901 Long term (current) use of anticoagulants: Principal | ICD-10-CM

## 2015-04-01 DIAGNOSIS — Z5181 Encounter for therapeutic drug level monitoring: Secondary | ICD-10-CM

## 2015-04-01 DIAGNOSIS — I2699 Other pulmonary embolism without acute cor pulmonale: Secondary | ICD-10-CM

## 2015-04-01 LAB — POCT INR: INR: 1.7

## 2015-04-10 ENCOUNTER — Ambulatory Visit: Payer: Medicaid Other | Attending: Internal Medicine | Admitting: Pharmacist

## 2015-04-10 DIAGNOSIS — Z5181 Encounter for therapeutic drug level monitoring: Secondary | ICD-10-CM

## 2015-04-10 DIAGNOSIS — I2699 Other pulmonary embolism without acute cor pulmonale: Secondary | ICD-10-CM

## 2015-04-10 DIAGNOSIS — Z7901 Long term (current) use of anticoagulants: Principal | ICD-10-CM

## 2015-04-10 LAB — POCT INR: INR: 2.1

## 2015-04-17 ENCOUNTER — Ambulatory Visit: Payer: Medicaid Other | Attending: Internal Medicine | Admitting: Pharmacist

## 2015-04-17 DIAGNOSIS — Z5181 Encounter for therapeutic drug level monitoring: Secondary | ICD-10-CM

## 2015-04-17 DIAGNOSIS — I2699 Other pulmonary embolism without acute cor pulmonale: Secondary | ICD-10-CM

## 2015-04-17 DIAGNOSIS — Z7901 Long term (current) use of anticoagulants: Principal | ICD-10-CM

## 2015-04-17 LAB — POCT INR: INR: 2.8

## 2015-05-01 ENCOUNTER — Ambulatory Visit: Payer: Self-pay | Admitting: Pharmacist

## 2015-05-01 ENCOUNTER — Encounter: Payer: Self-pay | Admitting: Pharmacist

## 2015-05-08 ENCOUNTER — Encounter: Payer: Self-pay | Admitting: Internal Medicine

## 2015-05-08 ENCOUNTER — Ambulatory Visit: Payer: Medicaid Other | Attending: Internal Medicine | Admitting: Internal Medicine

## 2015-05-08 VITALS — BP 103/65 | HR 75 | Temp 98.1°F | Resp 18 | Ht 63.0 in | Wt 130.2 lb

## 2015-05-08 DIAGNOSIS — F328 Other depressive episodes: Secondary | ICD-10-CM | POA: Diagnosis not present

## 2015-05-08 DIAGNOSIS — F3289 Other specified depressive episodes: Secondary | ICD-10-CM

## 2015-05-08 DIAGNOSIS — G894 Chronic pain syndrome: Secondary | ICD-10-CM | POA: Insufficient documentation

## 2015-05-08 DIAGNOSIS — I82409 Acute embolism and thrombosis of unspecified deep veins of unspecified lower extremity: Secondary | ICD-10-CM | POA: Diagnosis not present

## 2015-05-08 LAB — POCT INR: INR: 2.3

## 2015-05-08 MED ORDER — MIRTAZAPINE 15 MG PO TABS: 15.0000 mg | ORAL_TABLET | Freq: Every day | ORAL | Status: DC

## 2015-05-08 MED ORDER — TRAMADOL HCL 50 MG PO TABS: 50.0000 mg | ORAL_TABLET | Freq: Three times a day (TID) | ORAL | Status: DC | PRN

## 2015-05-08 MED ORDER — TRAZODONE HCL 100 MG PO TABS: 100.0000 mg | ORAL_TABLET | Freq: Every evening | ORAL | Status: DC | PRN

## 2015-05-08 MED ORDER — ARIPIPRAZOLE 5 MG PO TABS: 5.0000 mg | ORAL_TABLET | Freq: Every day | ORAL | Status: DC

## 2015-05-08 NOTE — Progress Notes (Signed)
Patient has a lump in her lower right leg from hitting it on the leg of a table. 2 weeks ago. Site Itches and aches.  Patient denies pain at the moment. Legs and arms go numb at night and ache. Patient states she walks a mile a day. Patient lays in bed and endures pain through the night. She has tried no self therapy.  Patient is on a blood thinner. Patient has "a lot more anxiety"  Needs potassium refill. Feels a lump in her throat. Had her throat cleaned 21mths ago.

## 2015-05-08 NOTE — Progress Notes (Signed)
Patient ID: LILYGRACE RODICK, female   DOB: January 24, 1961, 54 y.o.   MRN: [id number]   Emily Cameron, is a 54 y.o. female  [id number]  [id number]  DOB - Dec 20, 1960  Chief Complaint  Patient presents with  . Mass        Subjective:   Emily Cameron is a 54 y.o. female here today for a follow up visit.  Patient has history of pulmonary embolism on chronic anticoagulation, COPD, anxiety and depression, remote polysubstance abuse. Patient has a lump in her lower right leg from hitting it on the leg of a table 2 weeks ago. Site Itches and aches. Patient denies pain at the moment. Patient states she walks a mile a day. Patient has No headache, No chest pain, No abdominal pain - No Nausea, No new weakness tingling or numbness, No Cough - SOB.  Problem  Chronic Pain Syndrome    ALLERGIES: No Known Allergies  PAST MEDICAL HISTORY: Past Medical History  Diagnosis Date  . Anxiety   . Depression   . Insomnia   . Weight loss     due to anxiety  . PE (pulmonary embolism) 2006  . Alcohol abuse   . Polysubstance abuse     Current smoking and alcohol. 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 Marijuna not taking since 7 month.   Marland Kitchen COPD (chronic obstructive pulmonary disease)   . History of kidney stones     MEDICATIONS AT HOME: Prior to Admission medications   Medication Sig Start Date End Date Taking? Authorizing Provider  ARIPiprazole (ABILIFY) 5 MG tablet Take 1 tablet (5 mg total) by mouth daily. 05/08/15  Yes Tresa Garter, MD  megestrol (MEGACE) 400 MG/10ML suspension Take 10 mLs (400 mg total) by mouth 2 (two) times a week. 09/12/14  Yes Tresa Garter, MD  mirtazapine (REMERON) 15 MG tablet Take 1 tablet (15 mg total) by mouth at bedtime. 05/08/15  Yes Tresa Garter, MD  Tetrahydrozoline HCl (VISINE OP) Place 2 drops into both eyes as needed (for dry eyes).   Yes Historical Provider, MD  warfarin (COUMADIN) 5 MG tablet Take 1 tablet (5 mg total) by mouth  daily. 12/02/14  Yes Tresa Garter, MD  potassium chloride (K-DUR) 10 MEQ tablet Take 1 tablet (10 mEq total) by mouth daily. Patient not taking: Reported on 05/08/2015 09/16/14   Tresa Garter, MD  traMADol (ULTRAM) 50 MG tablet Take 1 tablet (50 mg total) by mouth every 8 (eight) hours as needed. 05/08/15   Tresa Garter, MD  traZODone (DESYREL) 100 MG tablet Take 1 tablet (100 mg total) by mouth at bedtime as needed for sleep. 05/08/15   Tresa Garter, MD     Objective:   Filed Vitals:   05/08/15 1421  BP: 103/65  Pulse: 75  Temp: 98.1 F (36.7 C)  TempSrc: Oral  Resp: 18  Height: 5\' 3"  (1.6 m)  Weight: 130 lb 3.2 oz (59.058 kg)  SpO2: 99%    Exam General appearance : Awake, alert, not in any distress. Speech Clear. Not toxic looking HEENT: Atraumatic and Normocephalic, pupils equally reactive to light and accomodation Neck: supple, no JVD. No cervical lymphadenopathy.  Chest:Good air entry bilaterally, no added sounds  CVS: S1 S2 regular, no murmurs.  Abdomen: Bowel sounds present, Non tender and not distended with no gaurding, rigidity or rebound. Extremities: B/L Lower Ext shows no edema, both legs are warm to touch Neurology: Awake alert, and oriented  X 3, CN II-XII intact, Non focal Skin:mild bruise and knotty feeling on right shin. No erythema  Data Review No results found for: HGBA1C   Assessment & Plan   1. Other depressive episodes  - ARIPiprazole (ABILIFY) 5 MG tablet; Take 1 tablet (5 mg total) by mouth daily.  Dispense: 30 tablet; Refill: 3 - mirtazapine (REMERON) 15 MG tablet; Take 1 tablet (15 mg total) by mouth at bedtime.  Dispense: 30 tablet; Refill: 3 - traZODone (DESYREL) 100 MG tablet; Take 1 tablet (100 mg total) by mouth at bedtime as needed for sleep.  Dispense: 30 tablet; Refill: 3  2. DVT (deep venous thrombosis), unspecified laterality  - INR  Area of bruise on right shin is healing well, no need for intervention.  3.  Chronic pain syndrome  - traMADol (ULTRAM) 50 MG tablet; Take 1 tablet (50 mg total) by mouth every 8 (eight) hours as needed.  Dispense: 60 tablet; Refill: 0  Patient have been counseled extensively about nutrition and exercise  Return in about 4 weeks (around 06/05/2015) for INR Check.  The patient was given clear instructions to go to ER or return to medical center if symptoms don't improve, worsen or new problems develop. The patient verbalized understanding. The patient was told to call to get lab results if they haven't heard anything in the next week.   This note has been created with Surveyor, quantity. Any transcriptional errors are unintentional.    [name], MD, [place], Karilyn Cota, [place] and [place], [place]   05/08/2015, 3:07 PM

## 2015-05-08 NOTE — Patient Instructions (Signed)

## 2015-05-29 ENCOUNTER — Telehealth: Payer: Self-pay | Admitting: Clinical

## 2015-05-29 NOTE — Telephone Encounter (Signed)
F/u with patient, says she is feeling "pretty good", started GED classes at [employer]; reminded that [facility] services are available at next PCP visit.

## 2015-06-04 ENCOUNTER — Telehealth: Payer: Self-pay | Admitting: Internal Medicine

## 2015-06-04 NOTE — Telephone Encounter (Signed)
Patient called stating that Tramadol is not helping with her pain. Patient would like a medication change to Talenol #3 Please f/u with pt.

## 2015-06-09 ENCOUNTER — Other Ambulatory Visit: Payer: Self-pay | Admitting: *Deleted

## 2015-06-09 MED ORDER — ACETAMINOPHEN-CODEINE #3 300-30 MG PO TABS: 1.0000 | ORAL_TABLET | Freq: Three times a day (TID) | ORAL | Status: DC | PRN

## 2015-06-09 NOTE — Telephone Encounter (Signed)
Per Dr. Doreene Burke patient may have Tylenol 3. Tramadol has been discontinued.

## 2015-06-09 NOTE — Telephone Encounter (Signed)
Pt. Called requesting Tylenol # 3. Pt. Stated that Crista Elliot is not helping. Please f/u.

## 2015-06-10 ENCOUNTER — Ambulatory Visit: Payer: Medicaid Other | Attending: Internal Medicine | Admitting: Pharmacist

## 2015-06-10 DIAGNOSIS — Z5181 Encounter for therapeutic drug level monitoring: Secondary | ICD-10-CM

## 2015-06-10 DIAGNOSIS — Z7901 Long term (current) use of anticoagulants: Principal | ICD-10-CM

## 2015-06-10 LAB — POCT INR: INR: 1.8

## 2015-06-17 ENCOUNTER — Ambulatory Visit: Payer: Medicaid Other | Attending: Internal Medicine | Admitting: Pharmacist

## 2015-06-17 DIAGNOSIS — Z5181 Encounter for therapeutic drug level monitoring: Secondary | ICD-10-CM

## 2015-06-17 DIAGNOSIS — Z7901 Long term (current) use of anticoagulants: Principal | ICD-10-CM

## 2015-06-17 LAB — POCT INR: INR: 1.9

## 2015-06-24 ENCOUNTER — Encounter: Payer: Self-pay | Admitting: Pharmacist

## 2015-06-26 ENCOUNTER — Ambulatory Visit: Payer: Medicaid Other | Attending: Internal Medicine | Admitting: Pharmacist

## 2015-06-26 ENCOUNTER — Ambulatory Visit: Payer: Medicaid Other

## 2015-06-26 DIAGNOSIS — Z7901 Long term (current) use of anticoagulants: Principal | ICD-10-CM

## 2015-06-26 DIAGNOSIS — Z5181 Encounter for therapeutic drug level monitoring: Secondary | ICD-10-CM

## 2015-06-26 LAB — POCT INR: INR: 2

## 2015-07-01 ENCOUNTER — Telehealth: Payer: Self-pay | Admitting: Clinical

## 2015-07-01 ENCOUNTER — Other Ambulatory Visit: Payer: Medicaid Other

## 2015-07-01 NOTE — Telephone Encounter (Signed)
Returning Emily Cameron's call, she came to [facility] at 1:45pm today, but could not wait until appointment time at 2:15pm, as her ride was leaving. She would like to be prescibed Seroquel again, last took it six years ago. Emily Cameron has also sent in request for medical records for her disability case, and wants to come in to talk about additional issues/concerns.

## 2015-07-03 ENCOUNTER — Telehealth: Payer: Self-pay | Admitting: Clinical

## 2015-07-03 NOTE — Telephone Encounter (Signed)
Ms. Carabajal informed that Dr. Doreene Burke recommends she go to [facility] for Seroquel prescription; also, let pt know that she will be contacted by [facility] medical records to let her know if there is anything else she needs to do to obtain her medical records for her disability case. Ms. Culver states that she sent a disability form to [employer] for medical release on Tuesday, 07-01-15.

## 2015-07-09 ENCOUNTER — Ambulatory Visit: Payer: Medicaid Other | Attending: Internal Medicine | Admitting: Pharmacist

## 2015-07-09 DIAGNOSIS — Z5181 Encounter for therapeutic drug level monitoring: Secondary | ICD-10-CM

## 2015-07-09 DIAGNOSIS — Z7901 Long term (current) use of anticoagulants: Principal | ICD-10-CM

## 2015-07-09 LAB — POCT INR: INR: 1.3

## 2015-07-09 NOTE — Progress Notes (Signed)
Patient reports that she has not been taking good care of health. She would like to speak with [facility]. Coordinated with the front desk as Roselyn Reef is not in the office and they will reschedule her appointment with Roselyn Reef.

## 2015-07-14 ENCOUNTER — Other Ambulatory Visit: Payer: Self-pay | Admitting: Internal Medicine

## 2015-07-14 NOTE — Telephone Encounter (Signed)
Patients Warfarin has been refilled.

## 2015-07-14 NOTE — Telephone Encounter (Signed)
Patient called and requested a med refill for warfarin (COUMADIN) 5 MG tablet. Please f/u with pt.

## 2015-07-15 ENCOUNTER — Telehealth: Payer: Self-pay | Admitting: Clinical

## 2015-07-15 ENCOUNTER — Other Ambulatory Visit: Payer: Self-pay | Admitting: *Deleted

## 2015-07-15 MED ORDER — ACETAMINOPHEN-CODEINE #3 300-30 MG PO TABS: 1.0000 | ORAL_TABLET | Freq: Three times a day (TID) | ORAL | Status: DC | PRN

## 2015-07-15 NOTE — Telephone Encounter (Signed)
Returned patient call, Emily Cameron is requesting to go back to taking Tramadol, instead of the Tylenol 3, because "the Tylenol 3 is not working for me, the Tramadol worked better"; she is informed that her request will be routed to her PCP, and either a nurse or the pharmacy will contact her with outcome of this request.

## 2015-07-16 ENCOUNTER — Telehealth: Payer: Self-pay | Admitting: Internal Medicine

## 2015-07-16 ENCOUNTER — Other Ambulatory Visit: Payer: Self-pay | Admitting: Internal Medicine

## 2015-07-16 ENCOUNTER — Other Ambulatory Visit: Payer: Self-pay

## 2015-07-16 MED ORDER — TRAMADOL HCL 50 MG PO TABS: 50.0000 mg | ORAL_TABLET | Freq: Three times a day (TID) | ORAL | Status: DC | PRN

## 2015-07-16 NOTE — Telephone Encounter (Signed)
Spoke with patient and advised her that she would need to bring her tylenol #3 pills before we issue her tramadol. Patient understood and will come by sometime today.

## 2015-07-16 NOTE — Telephone Encounter (Signed)
Patient would like to be switched back to tramadol instead of taking tylenol #3. Please follow up with pt. Thank you.

## 2015-07-16 NOTE — Telephone Encounter (Signed)
Patient called front office staff checking on status of tramadol prescription. Alinia, front office staff, alerted nurse of patients need. Nurse spoke to Dr. Doreene Burke. Per Dr. Doreene Burke, patient can bring Tylenol 3 back to pharmacy. When patient returns medication to pharmacy, Dr. Doreene Burke will print prescription for Tramadol. Nurse make pharmacy aware.

## 2015-07-16 NOTE — Telephone Encounter (Signed)
Nurse spoke with Dr. Doreene Burke. Emily Cameron never picked up Tylenol 3 printed prescription. Per Dr. Doreene Burke, discontinue Tylenol 3 prescription and order Tramadol 50mg , 1 tab every 8 hours as needed, 60 tabs. Nurse printed prescription. Emily Cameron aware of prescription in book at front office to be signed out.  Emily Cameron voices understanding and has no further questions at this time.

## 2015-07-23 ENCOUNTER — Encounter: Payer: Self-pay | Admitting: Pharmacist

## 2015-07-24 ENCOUNTER — Encounter: Payer: Self-pay | Admitting: Pharmacist

## 2015-07-31 ENCOUNTER — Encounter: Payer: Self-pay | Admitting: Pharmacist

## 2015-08-07 ENCOUNTER — Ambulatory Visit: Payer: Medicaid Other | Attending: Internal Medicine | Admitting: Pharmacist

## 2015-08-07 DIAGNOSIS — Z5181 Encounter for therapeutic drug level monitoring: Secondary | ICD-10-CM

## 2015-08-07 DIAGNOSIS — Z7901 Long term (current) use of anticoagulants: Secondary | ICD-10-CM

## 2015-08-07 LAB — POCT INR: INR: 1.9

## 2015-08-07 NOTE — Progress Notes (Signed)
Pharmacy [facility]  Subjective: Patient presents today for INR monitoring. 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   Current dose of warfarin: 5 mg daily but 2.5 mg on Thursdays  Adherence to warfarin: denies missed doses Signs/symptoms of bleeding: denies Recent changes in diet: denies Recent changes in medications: denies Upcoming procedures that may impact anticoagulation: denies   Objective: Today's INR = 1.9   Assessment and Plan: Anticoagulation: Patient is Subtherapeutic based on patient's INR of 1.9 and patient's INR goal of 2-3. Will Increase current warfarin dose: 5 mg daily.  Patient verbalized understanding and was provided with written instructions. Next INR check planned for 1 week.

## 2015-08-14 ENCOUNTER — Encounter: Payer: Self-pay | Admitting: Pharmacist

## 2015-08-19 ENCOUNTER — Encounter: Payer: Self-pay | Admitting: Pharmacist

## 2015-08-19 ENCOUNTER — Telehealth: Payer: Self-pay | Admitting: Internal Medicine

## 2015-08-19 NOTE — Telephone Encounter (Signed)
Patient called and requested a med refill for traMADol (ULTRAM) 50 MG tablet. Please f/u with pt.

## 2015-08-20 ENCOUNTER — Other Ambulatory Visit: Payer: Self-pay

## 2015-08-20 MED ORDER — TRAMADOL HCL 50 MG PO TABS: 50.0000 mg | ORAL_TABLET | Freq: Three times a day (TID) | ORAL | Status: DC | PRN

## 2015-08-26 ENCOUNTER — Ambulatory Visit: Payer: Medicaid Other | Attending: Internal Medicine | Admitting: Pharmacist

## 2015-08-26 ENCOUNTER — Encounter: Payer: Self-pay | Admitting: Pharmacist

## 2015-08-26 DIAGNOSIS — Z7901 Long term (current) use of anticoagulants: Principal | ICD-10-CM

## 2015-08-26 DIAGNOSIS — Z5181 Encounter for therapeutic drug level monitoring: Secondary | ICD-10-CM

## 2015-08-26 LAB — POCT INR: INR: 1.3

## 2015-08-26 MED ORDER — TRAZODONE HCL 100 MG PO TABS: 100.0000 mg | ORAL_TABLET | Freq: Every evening | ORAL | Status: DC | PRN

## 2015-08-26 MED ORDER — MIRTAZAPINE 15 MG PO TABS: 15.0000 mg | ORAL_TABLET | Freq: Every day | ORAL | Status: DC

## 2015-08-26 MED ORDER — ARIPIPRAZOLE 5 MG PO TABS: 5.0000 mg | ORAL_TABLET | Freq: Every day | ORAL | Status: DC

## 2015-08-26 MED FILL — traZODone HCL 100 MG TABS: 100 | 30 days supply | Qty: 30 | Fill #0

## 2015-08-26 MED FILL — ARIPiprazole 5 MG TABS: 5 | 30 days supply | Qty: 30 | Fill #0

## 2015-08-26 MED FILL — MIRTAZAPINE 15 MG TABLET: 15 | 30 days supply | Qty: 30 | Fill #0

## 2015-08-27 MED FILL — traMADol HCL 50 MG TABS: 50 | 20 days supply | Qty: 60 | Fill #0

## 2015-09-02 ENCOUNTER — Encounter: Payer: Self-pay | Admitting: Pharmacist

## 2015-09-04 ENCOUNTER — Encounter: Payer: Self-pay | Admitting: Pharmacist

## 2015-09-04 ENCOUNTER — Telehealth: Payer: Self-pay | Admitting: Internal Medicine

## 2015-09-04 NOTE — Telephone Encounter (Signed)
Pt. Called to reschedule INR appt..she states she is feeling well and is taking her medications as directed...Marland KitchenMarland Kitchen

## 2015-09-09 ENCOUNTER — Ambulatory Visit: Payer: Medicaid Other | Attending: Internal Medicine | Admitting: Pharmacist

## 2015-09-09 ENCOUNTER — Ambulatory Visit (HOSPITAL_BASED_OUTPATIENT_CLINIC_OR_DEPARTMENT_OTHER): Payer: Medicaid Other

## 2015-09-09 VITALS — BP 111/82 | HR 64 | Temp 99.0°F | Resp 18 | Ht 63.0 in | Wt 134.0 lb

## 2015-09-09 DIAGNOSIS — R319 Hematuria, unspecified: Secondary | ICD-10-CM | POA: Diagnosis present

## 2015-09-09 DIAGNOSIS — Z7901 Long term (current) use of anticoagulants: Secondary | ICD-10-CM

## 2015-09-09 DIAGNOSIS — Z5181 Encounter for therapeutic drug level monitoring: Secondary | ICD-10-CM

## 2015-09-09 LAB — POCT URINALYSIS DIPSTICK
BILIRUBIN UA: NEGATIVE
Glucose, UA: NEGATIVE
KETONES UA: NEGATIVE
Nitrite, UA: NEGATIVE
PH UA: 7
SPEC GRAV UA: 1.015
Urobilinogen, UA: 1

## 2015-09-09 LAB — POCT INR: INR: 2.5

## 2015-09-09 MED FILL — WARFARIN SODIUM 5 MG TABLET: 5 | 30 days supply | Qty: 30 | Fill #1

## 2015-09-09 NOTE — Progress Notes (Signed)
Patient complains of hematuria. INR is 2.5 so I do not think it is due to over-anticoagulation. Patient reports having this in the past and that it was an issue with her kidney. Will refer her to nurse for visit for UTI workup.

## 2015-09-09 NOTE — Patient Instructions (Signed)
Please follow up with Dr. Doreene Burke in 1 week for repeat urinalysis and abdominal pain, blood in urine.

## 2015-09-09 NOTE — Progress Notes (Signed)
Pain in right abdomen, radiates to chest. Started happening last week and last for 3 days. Patient currently complains blood in urine. Patient denies pain and frequency with urination. Patient denies pain in abdomen currently.   Patient experienced chest pain twice last week, lasted 10 minutes.Patient explained chest pain as "anxiety-type" or "indigestion". Patient denies chest pain at this time.   Chart and urinalysis results reviewed by Dr. Jarold Song. Per Dr. Jarold Song, patient is to hydrate with plenty of fluids and follow up with PCP to recheck urine.

## 2015-09-15 ENCOUNTER — Ambulatory Visit: Payer: Self-pay | Admitting: Internal Medicine

## 2015-09-18 ENCOUNTER — Ambulatory Visit: Payer: Self-pay | Admitting: Internal Medicine

## 2015-09-18 ENCOUNTER — Ambulatory Visit: Payer: Medicaid Other | Attending: Internal Medicine | Admitting: Pharmacist

## 2015-09-18 LAB — POCT INR: INR: 3.9

## 2015-09-25 ENCOUNTER — Encounter: Payer: Self-pay | Admitting: Pharmacist

## 2015-10-01 ENCOUNTER — Other Ambulatory Visit: Payer: Self-pay | Admitting: *Deleted

## 2015-10-01 DIAGNOSIS — G894 Chronic pain syndrome: Secondary | ICD-10-CM

## 2015-10-02 ENCOUNTER — Ambulatory Visit: Payer: Medicaid Other | Attending: Internal Medicine | Admitting: Pharmacist

## 2015-10-02 DIAGNOSIS — Z5181 Encounter for therapeutic drug level monitoring: Secondary | ICD-10-CM

## 2015-10-02 DIAGNOSIS — Z7901 Long term (current) use of anticoagulants: Principal | ICD-10-CM

## 2015-10-02 LAB — POCT INR: INR: 2.1

## 2015-10-02 MED ORDER — TRAMADOL HCL 50 MG PO TABS: 50.0000 mg | ORAL_TABLET | Freq: Three times a day (TID) | ORAL | Status: DC | PRN

## 2015-10-02 NOTE — Telephone Encounter (Signed)
Patients Tramadol has been refilled with no additional refills and placed at the front desk for pick up

## 2015-10-03 MED FILL — MIRTAZAPINE 15 MG TABLET: 15 | 30 days supply | Qty: 30 | Fill #2

## 2015-10-03 MED FILL — traZODone HCL 100 MG TABS: 100 | 30 days supply | Qty: 30 | Fill #2

## 2015-10-07 MED FILL — traMADol HCL 50 MG TABS: 50 | 20 days supply | Qty: 60 | Fill #0

## 2015-10-16 ENCOUNTER — Encounter: Payer: Self-pay | Admitting: Pharmacist

## 2015-10-21 ENCOUNTER — Ambulatory Visit: Payer: Medicaid Other | Attending: Internal Medicine | Admitting: Pharmacist

## 2015-10-21 DIAGNOSIS — Z5181 Encounter for therapeutic drug level monitoring: Secondary | ICD-10-CM

## 2015-10-21 DIAGNOSIS — Z7901 Long term (current) use of anticoagulants: Principal | ICD-10-CM

## 2015-10-21 LAB — POCT INR: INR: 1.2

## 2015-10-28 ENCOUNTER — Encounter: Payer: Self-pay | Admitting: Pharmacist

## 2015-11-06 ENCOUNTER — Ambulatory Visit: Payer: Medicaid Other | Attending: Internal Medicine | Admitting: Pharmacist

## 2015-11-06 DIAGNOSIS — Z7901 Long term (current) use of anticoagulants: Principal | ICD-10-CM

## 2015-11-06 DIAGNOSIS — Z5181 Encounter for therapeutic drug level monitoring: Secondary | ICD-10-CM

## 2015-11-06 LAB — POCT INR: INR: 1.1

## 2015-11-07 ENCOUNTER — Other Ambulatory Visit: Payer: Self-pay | Admitting: Internal Medicine

## 2015-11-07 MED FILL — MIRTAZAPINE 15 MG TABLET: 15 | 30 days supply | Qty: 30 | Fill #3

## 2015-11-07 MED FILL — traZODone HCL 100 MG TABS: 100 | 30 days supply | Qty: 30 | Fill #3

## 2015-11-07 MED FILL — WARFARIN SODIUM 5 MG TABLET: 5 | 30 days supply | Qty: 30 | Fill #2

## 2015-11-12 ENCOUNTER — Other Ambulatory Visit: Payer: Self-pay | Admitting: *Deleted

## 2015-11-12 ENCOUNTER — Telehealth: Payer: Self-pay | Admitting: Internal Medicine

## 2015-11-12 DIAGNOSIS — G894 Chronic pain syndrome: Secondary | ICD-10-CM

## 2015-11-12 MED ORDER — TRAMADOL HCL 50 MG PO TABS: 50.0000 mg | ORAL_TABLET | Freq: Three times a day (TID) | ORAL | Status: DC | PRN

## 2015-11-12 NOTE — Telephone Encounter (Signed)
Request has been sent to PCP.  MA spoke with patient and informed her of the request being sent to the PCP and patient receiving a FU once the prescription is placed at the front desk.

## 2015-11-12 NOTE — Telephone Encounter (Signed)
Pt. Called requesting a med refill on Tramadol. Please f/u with pt. °

## 2015-11-12 NOTE — Telephone Encounter (Signed)
Patient states she would like a refill on Tramadol which was last refilled on 10/02/15.

## 2015-11-13 ENCOUNTER — Ambulatory Visit: Payer: Medicaid Other | Attending: Internal Medicine | Admitting: Pharmacist

## 2015-11-13 DIAGNOSIS — Z7901 Long term (current) use of anticoagulants: Principal | ICD-10-CM

## 2015-11-13 DIAGNOSIS — Z5181 Encounter for therapeutic drug level monitoring: Secondary | ICD-10-CM

## 2015-11-13 LAB — POCT INR: INR: 1.2

## 2015-11-13 MED FILL — traMADol HCL 50 MG TABS: 50 | 20 days supply | Qty: 60 | Fill #0

## 2015-11-20 ENCOUNTER — Encounter: Payer: Self-pay | Admitting: Pharmacist

## 2015-11-27 ENCOUNTER — Encounter: Payer: Self-pay | Admitting: Pharmacist

## 2015-12-09 ENCOUNTER — Ambulatory Visit: Payer: Medicaid Other | Attending: Internal Medicine | Admitting: Pharmacist

## 2015-12-09 DIAGNOSIS — Z7901 Long term (current) use of anticoagulants: Principal | ICD-10-CM

## 2015-12-09 DIAGNOSIS — Z5181 Encounter for therapeutic drug level monitoring: Secondary | ICD-10-CM

## 2015-12-09 DIAGNOSIS — I2699 Other pulmonary embolism without acute cor pulmonale: Secondary | ICD-10-CM

## 2015-12-09 LAB — POCT INR: INR: 2.1

## 2015-12-12 ENCOUNTER — Telehealth: Payer: Self-pay | Admitting: Internal Medicine

## 2015-12-12 NOTE — Telephone Encounter (Signed)
Patient called requesting a medication refill for tramadol. Please follow up.

## 2015-12-16 ENCOUNTER — Encounter: Payer: Self-pay | Admitting: Pharmacist

## 2015-12-18 ENCOUNTER — Other Ambulatory Visit: Payer: Self-pay | Admitting: *Deleted

## 2015-12-18 ENCOUNTER — Ambulatory Visit: Payer: Medicaid Other | Attending: Internal Medicine | Admitting: Pharmacist

## 2015-12-18 DIAGNOSIS — I2699 Other pulmonary embolism without acute cor pulmonale: Secondary | ICD-10-CM

## 2015-12-18 DIAGNOSIS — Z5181 Encounter for therapeutic drug level monitoring: Secondary | ICD-10-CM

## 2015-12-18 DIAGNOSIS — Z7901 Long term (current) use of anticoagulants: Principal | ICD-10-CM

## 2015-12-18 DIAGNOSIS — G894 Chronic pain syndrome: Secondary | ICD-10-CM

## 2015-12-18 LAB — POCT INR: INR: 1.4

## 2015-12-18 MED ORDER — TRAMADOL HCL 50 MG PO TABS: 50.0000 mg | ORAL_TABLET | Freq: Three times a day (TID) | ORAL | Status: DC | PRN

## 2015-12-18 NOTE — Telephone Encounter (Signed)
Patient received Tramadol refill on 11/12/15. Patient is requesting a refill at this time.

## 2015-12-18 NOTE — Telephone Encounter (Signed)
Medication printed. Awaiting PCP signature.

## 2015-12-25 ENCOUNTER — Encounter: Payer: Self-pay | Admitting: Pharmacist

## 2016-01-01 ENCOUNTER — Encounter: Payer: Self-pay | Admitting: Pharmacist

## 2016-01-06 ENCOUNTER — Encounter: Payer: Self-pay | Admitting: Pharmacist

## 2016-01-08 ENCOUNTER — Ambulatory Visit: Payer: Medicaid Other | Attending: Internal Medicine | Admitting: Pharmacist

## 2016-01-08 DIAGNOSIS — Z5181 Encounter for therapeutic drug level monitoring: Secondary | ICD-10-CM

## 2016-01-08 DIAGNOSIS — Z7901 Long term (current) use of anticoagulants: Principal | ICD-10-CM

## 2016-01-08 DIAGNOSIS — I2699 Other pulmonary embolism without acute cor pulmonale: Secondary | ICD-10-CM

## 2016-01-08 LAB — POCT INR: INR: 3.1

## 2016-01-14 ENCOUNTER — Telehealth: Payer: Self-pay | Admitting: Internal Medicine

## 2016-01-14 NOTE — Telephone Encounter (Signed)
Pt. Called requesting a refill on Tramadol. Please f/u with pt.  °

## 2016-01-20 NOTE — Telephone Encounter (Signed)
Patient called back requesting medication refill on Tramadol. Please f/up

## 2016-01-21 ENCOUNTER — Other Ambulatory Visit: Payer: Self-pay | Admitting: *Deleted

## 2016-01-21 DIAGNOSIS — G894 Chronic pain syndrome: Secondary | ICD-10-CM

## 2016-01-21 MED ORDER — TRAMADOL HCL 50 MG PO TABS: 50.0000 mg | ORAL_TABLET | Freq: Three times a day (TID) | ORAL | Status: DC | PRN

## 2016-01-21 NOTE — Telephone Encounter (Signed)
Patient needs tramadol. Please follow up.

## 2016-01-21 NOTE — Telephone Encounter (Signed)
Patients last OV was 01/08/16 with Erline Levine for [facility]. Patient received Tramadol refill on 12/18/15. Patient is requesting a refill at this time.

## 2016-01-22 ENCOUNTER — Ambulatory Visit: Payer: Medicaid Other | Attending: Internal Medicine | Admitting: Pharmacist

## 2016-01-22 DIAGNOSIS — Z7901 Long term (current) use of anticoagulants: Principal | ICD-10-CM

## 2016-01-22 DIAGNOSIS — I2699 Other pulmonary embolism without acute cor pulmonale: Secondary | ICD-10-CM

## 2016-01-22 DIAGNOSIS — Z5181 Encounter for therapeutic drug level monitoring: Secondary | ICD-10-CM

## 2016-01-22 LAB — POCT INR: INR: 2.3

## 2016-01-27 ENCOUNTER — Other Ambulatory Visit: Payer: Self-pay | Admitting: Internal Medicine

## 2016-02-03 NOTE — Telephone Encounter (Signed)
Patients request was filled on 01/21/16 and placed at the front desk for pickup

## 2016-02-12 ENCOUNTER — Encounter: Payer: Self-pay | Admitting: Pharmacist

## 2016-02-17 ENCOUNTER — Encounter: Payer: Self-pay | Admitting: Pharmacist

## 2016-02-19 ENCOUNTER — Encounter: Payer: Self-pay | Admitting: Pharmacist

## 2016-02-26 ENCOUNTER — Ambulatory Visit: Payer: Medicaid Other | Attending: Internal Medicine | Admitting: Pharmacist

## 2016-02-26 ENCOUNTER — Telehealth: Payer: Self-pay | Admitting: Internal Medicine

## 2016-02-26 DIAGNOSIS — I2699 Other pulmonary embolism without acute cor pulmonale: Secondary | ICD-10-CM

## 2016-02-26 DIAGNOSIS — Z5181 Encounter for therapeutic drug level monitoring: Secondary | ICD-10-CM

## 2016-02-26 DIAGNOSIS — Z7901 Long term (current) use of anticoagulants: Principal | ICD-10-CM

## 2016-02-26 LAB — POCT INR: INR: 1.1

## 2016-02-26 MED ORDER — TRAZODONE HCL 100 MG PO TABS: ORAL_TABLET | ORAL | Status: DC

## 2016-02-26 MED ORDER — WARFARIN SODIUM 5 MG PO TABS: 5.0000 mg | ORAL_TABLET | Freq: Every day | ORAL | Status: DC

## 2016-02-26 MED ORDER — MEGESTROL ACETATE 40 MG/ML PO SUSP: ORAL | Status: DC

## 2016-02-26 MED ORDER — MIRTAZAPINE 15 MG PO TABS: 15.0000 mg | ORAL_TABLET | Freq: Every day | ORAL | Status: DC

## 2016-02-26 MED FILL — MEGESTROL ACET 40 MG/ML SUS: 40 | 30 days supply | Qty: 80 | Fill #0

## 2016-02-26 MED FILL — WARFARIN SODIUM 5 MG TABLET: 5 | 30 days supply | Qty: 32 | Fill #0

## 2016-02-26 MED FILL — traZODone HCL 100 MG TABS: 100 | 30 days supply | Qty: 30 | Fill #0

## 2016-02-26 MED FILL — MIRTAZAPINE 15 MG TABLET: 15 | 30 days supply | Qty: 30 | Fill #0

## 2016-02-26 NOTE — Telephone Encounter (Signed)
Pt. Called requesting a refill on Tramadol. Pt. Is out of medication. Please f/u with pt.

## 2016-02-27 ENCOUNTER — Telehealth: Payer: Self-pay | Admitting: Internal Medicine

## 2016-02-27 DIAGNOSIS — G894 Chronic pain syndrome: Secondary | ICD-10-CM

## 2016-02-27 NOTE — Telephone Encounter (Signed)
Patient called to request tramadol.

## 2016-03-01 NOTE — Telephone Encounter (Signed)
Pt. Called requesting a refill on Tramadol. Please f/u with pt.  °

## 2016-03-02 NOTE — Telephone Encounter (Signed)
Pt. Called requesting a refill on Tramadol. Please f/u with pt.  °

## 2016-03-03 MED ORDER — TRAMADOL HCL 50 MG PO TABS: 50.0000 mg | ORAL_TABLET | Freq: Three times a day (TID) | ORAL | Status: DC | PRN

## 2016-03-03 NOTE — Telephone Encounter (Signed)
rx filled out. Needs to sign pain contract when picks up medicine.

## 2016-03-03 NOTE — Telephone Encounter (Signed)
Patient is calling back checking status of refill request for Tramadol...Marland KitchenMarland Kitchen Please refill and follow up with patient

## 2016-03-04 ENCOUNTER — Ambulatory Visit: Payer: Medicaid Other | Attending: Internal Medicine | Admitting: Pharmacist

## 2016-03-04 DIAGNOSIS — Z7901 Long term (current) use of anticoagulants: Principal | ICD-10-CM

## 2016-03-04 DIAGNOSIS — I2699 Other pulmonary embolism without acute cor pulmonale: Secondary | ICD-10-CM

## 2016-03-04 DIAGNOSIS — Z5181 Encounter for therapeutic drug level monitoring: Secondary | ICD-10-CM

## 2016-03-04 LAB — POCT INR: INR: 1.4

## 2016-03-04 MED FILL — traMADol HCL 50 MG TABS: 50 | 20 days supply | Qty: 60 | Fill #0

## 2016-03-10 NOTE — Telephone Encounter (Signed)
Patient received and picked up prescription. Pain contract was placed in the scan pile.

## 2016-03-23 ENCOUNTER — Ambulatory Visit: Payer: Medicaid Other | Attending: Internal Medicine | Admitting: Pharmacist

## 2016-03-23 DIAGNOSIS — Z7901 Long term (current) use of anticoagulants: Principal | ICD-10-CM

## 2016-03-23 DIAGNOSIS — Z5181 Encounter for therapeutic drug level monitoring: Secondary | ICD-10-CM

## 2016-03-23 DIAGNOSIS — I2699 Other pulmonary embolism without acute cor pulmonale: Secondary | ICD-10-CM

## 2016-03-23 LAB — POCT INR: INR: 4.3

## 2016-03-23 MED FILL — ARIPiprazole 5 MG TABS: 5 | 30 days supply | Qty: 30 | Fill #0

## 2016-03-30 ENCOUNTER — Other Ambulatory Visit: Payer: Self-pay | Admitting: Internal Medicine

## 2016-03-30 NOTE — Telephone Encounter (Signed)
Pt called requesting medication refill on traMADol (ULTRAM) 50 MG tablet, please f/up ° °

## 2016-03-31 NOTE — Telephone Encounter (Signed)
Pt called checking on status Tramadol refill, pt states she was unaware of having to call to get refill after contract was signed. Please f/up

## 2016-04-01 ENCOUNTER — Other Ambulatory Visit: Payer: Self-pay | Admitting: Family Medicine

## 2016-04-01 ENCOUNTER — Ambulatory Visit: Payer: Medicaid Other | Attending: Internal Medicine | Admitting: Pharmacist

## 2016-04-01 ENCOUNTER — Encounter: Payer: Self-pay | Admitting: Pharmacist

## 2016-04-01 DIAGNOSIS — Z5181 Encounter for therapeutic drug level monitoring: Secondary | ICD-10-CM

## 2016-04-01 DIAGNOSIS — I2699 Other pulmonary embolism without acute cor pulmonale: Secondary | ICD-10-CM

## 2016-04-01 DIAGNOSIS — Z7901 Long term (current) use of anticoagulants: Principal | ICD-10-CM

## 2016-04-01 DIAGNOSIS — G894 Chronic pain syndrome: Secondary | ICD-10-CM

## 2016-04-01 LAB — POCT INR: INR: 1.2

## 2016-04-01 MED ORDER — TRAMADOL HCL 50 MG PO TABS: 50.0000 mg | ORAL_TABLET | Freq: Three times a day (TID) | ORAL | 0 refills | Status: DC | PRN

## 2016-04-01 MED FILL — traMADol HCL 50 MG TABS: 50 | 20 days supply | Qty: 60 | Fill #0

## 2016-04-01 NOTE — Telephone Encounter (Signed)
Medication refill: traMADol (ULTRAM) 50 MG tablet  Pt is upset that Rx has not been refilled. Pt asking "what is the point of the contract if medication is not being filled on the same day each month"  Pt states she will be in office later today, 8/10 to get blood work done and wants to pick up Rx Pt was made aware that Rx will likely not be ready today when she comes in

## 2016-04-01 NOTE — Telephone Encounter (Signed)
Pt. Called requesting a refill on Tramadol. Please f/u with pt.  °

## 2016-04-01 NOTE — Telephone Encounter (Signed)
Done

## 2016-04-08 ENCOUNTER — Encounter: Payer: Self-pay | Admitting: Pharmacist

## 2016-04-13 ENCOUNTER — Encounter: Payer: Self-pay | Admitting: Pharmacist

## 2016-04-20 ENCOUNTER — Encounter: Payer: Self-pay | Admitting: Pharmacist

## 2016-04-22 ENCOUNTER — Ambulatory Visit: Payer: Medicaid Other | Attending: Internal Medicine | Admitting: Pharmacist

## 2016-04-22 DIAGNOSIS — I2699 Other pulmonary embolism without acute cor pulmonale: Secondary | ICD-10-CM

## 2016-04-22 DIAGNOSIS — Z7901 Long term (current) use of anticoagulants: Principal | ICD-10-CM

## 2016-04-22 DIAGNOSIS — Z5181 Encounter for therapeutic drug level monitoring: Secondary | ICD-10-CM

## 2016-04-22 LAB — POCT INR: INR: 1.1

## 2016-04-22 MED FILL — WARFARIN SODIUM 5 MG TABLET: 5 | 30 days supply | Qty: 32 | Fill #1

## 2016-04-27 ENCOUNTER — Other Ambulatory Visit: Payer: Self-pay | Admitting: Internal Medicine

## 2016-04-27 ENCOUNTER — Telehealth: Payer: Self-pay | Admitting: Internal Medicine

## 2016-04-27 DIAGNOSIS — G894 Chronic pain syndrome: Secondary | ICD-10-CM

## 2016-04-27 MED ORDER — TRAMADOL HCL 50 MG PO TABS: 50.0000 mg | ORAL_TABLET | Freq: Three times a day (TID) | ORAL | 0 refills | Status: DC | PRN

## 2016-04-27 NOTE — Telephone Encounter (Signed)
Pt called requesting medication refill for traMADol (ULTRAM) 50 MG tablet. Pt is requesting medication early but is not due until 09/10 please f/up

## 2016-04-29 ENCOUNTER — Ambulatory Visit: Payer: Medicaid Other | Attending: Internal Medicine | Admitting: Pharmacist

## 2016-04-29 DIAGNOSIS — Z7901 Long term (current) use of anticoagulants: Principal | ICD-10-CM

## 2016-04-29 DIAGNOSIS — I2699 Other pulmonary embolism without acute cor pulmonale: Secondary | ICD-10-CM

## 2016-04-29 DIAGNOSIS — Z5181 Encounter for therapeutic drug level monitoring: Secondary | ICD-10-CM

## 2016-04-29 LAB — POCT INR: INR: 2.4

## 2016-05-06 ENCOUNTER — Ambulatory Visit: Payer: Medicaid Other | Attending: Internal Medicine | Admitting: Pharmacist

## 2016-05-06 DIAGNOSIS — I2699 Other pulmonary embolism without acute cor pulmonale: Secondary | ICD-10-CM

## 2016-05-06 DIAGNOSIS — Z5181 Encounter for therapeutic drug level monitoring: Secondary | ICD-10-CM

## 2016-05-06 DIAGNOSIS — Z7901 Long term (current) use of anticoagulants: Principal | ICD-10-CM

## 2016-05-06 LAB — POCT INR: INR: 2.2

## 2016-05-19 ENCOUNTER — Encounter: Payer: Self-pay | Admitting: Pharmacist

## 2016-05-25 ENCOUNTER — Telehealth: Payer: Self-pay | Admitting: Internal Medicine

## 2016-05-25 NOTE — Telephone Encounter (Signed)
Pt called requesting medication refill on traMADol (ULTRAM) 50 MG tablet °Please f/up °

## 2016-05-26 ENCOUNTER — Other Ambulatory Visit: Payer: Self-pay | Admitting: Internal Medicine

## 2016-05-26 DIAGNOSIS — G894 Chronic pain syndrome: Secondary | ICD-10-CM

## 2016-05-26 MED ORDER — TRAMADOL HCL 50 MG PO TABS: 50.0000 mg | ORAL_TABLET | Freq: Three times a day (TID) | ORAL | 0 refills | Status: DC | PRN

## 2016-05-26 NOTE — Telephone Encounter (Signed)
!!!  Please inform patient of tramadol prescription has been placed a the front desk for pickup!!!

## 2016-05-27 ENCOUNTER — Encounter: Payer: Self-pay | Admitting: Pharmacist

## 2016-05-27 ENCOUNTER — Ambulatory Visit: Payer: Medicaid Other | Attending: Internal Medicine | Admitting: Pharmacist

## 2016-05-27 DIAGNOSIS — Z5181 Encounter for therapeutic drug level monitoring: Secondary | ICD-10-CM

## 2016-05-27 DIAGNOSIS — Z7901 Long term (current) use of anticoagulants: Principal | ICD-10-CM

## 2016-05-27 DIAGNOSIS — I2699 Other pulmonary embolism without acute cor pulmonale: Secondary | ICD-10-CM

## 2016-05-27 LAB — POCT INR: INR: 1.1

## 2016-05-27 MED FILL — traMADol HCL 50 MG TABS: 50 | 20 days supply | Qty: 60 | Fill #0

## 2016-06-03 ENCOUNTER — Encounter: Payer: Self-pay | Admitting: Pharmacist

## 2016-06-10 ENCOUNTER — Ambulatory Visit: Payer: Medicaid Other | Attending: Internal Medicine | Admitting: Pharmacist

## 2016-06-10 DIAGNOSIS — Z7901 Long term (current) use of anticoagulants: Principal | ICD-10-CM

## 2016-06-10 DIAGNOSIS — Z5181 Encounter for therapeutic drug level monitoring: Secondary | ICD-10-CM

## 2016-06-10 DIAGNOSIS — I2699 Other pulmonary embolism without acute cor pulmonale: Secondary | ICD-10-CM

## 2016-06-10 LAB — POCT INR: INR: 1.1

## 2016-06-14 ENCOUNTER — Emergency Department (HOSPITAL_COMMUNITY)
Admission: EM | Admit: 2016-06-14 | Discharge: 2016-06-14 | Disposition: A | Discharge status: Home / Self Care | Payer: Medicaid Other | Attending: Emergency Medicine | Admitting: Emergency Medicine

## 2016-06-14 ENCOUNTER — Encounter (HOSPITAL_COMMUNITY): Payer: Self-pay | Admitting: Emergency Medicine

## 2016-06-14 DIAGNOSIS — R220 Localized swelling, mass and lump, head: Secondary | ICD-10-CM

## 2016-06-14 DIAGNOSIS — Z7901 Long term (current) use of anticoagulants: Secondary | ICD-10-CM | POA: Diagnosis not present

## 2016-06-14 DIAGNOSIS — J449 Chronic obstructive pulmonary disease, unspecified: Secondary | ICD-10-CM | POA: Insufficient documentation

## 2016-06-14 DIAGNOSIS — K047 Periapical abscess without sinus: Secondary | ICD-10-CM | POA: Insufficient documentation

## 2016-06-14 DIAGNOSIS — F17210 Nicotine dependence, cigarettes, uncomplicated: Secondary | ICD-10-CM | POA: Insufficient documentation

## 2016-06-14 DIAGNOSIS — K0889 Other specified disorders of teeth and supporting structures: Secondary | ICD-10-CM | POA: Diagnosis present

## 2016-06-14 DIAGNOSIS — Z79899 Other long term (current) drug therapy: Secondary | ICD-10-CM | POA: Diagnosis not present

## 2016-06-14 MED ORDER — BUPIVACAINE-EPINEPHRINE (PF) 0.5% -1:200000 IJ SOLN
1.8000 mL | Freq: Once | INTRAMUSCULAR | Status: AC
  Administered 2016-06-14: 1.8 mL
  Filled 2016-06-14: qty 1.8

## 2016-06-14 MED ORDER — AMOXICILLIN-POT CLAVULANATE 875-125 MG PO TABS
1.0000 | ORAL_TABLET | Freq: Once | ORAL | Status: AC
  Administered 2016-06-14: 1 via ORAL
  Filled 2016-06-14: qty 1

## 2016-06-14 NOTE — ED Notes (Signed)
Pt walked out after eval. PA made aware.

## 2016-06-14 NOTE — ED Provider Notes (Signed)
[facility] DEPT Provider Note   CSN: [id number] Arrival date & time: 06/14/16  1253     History   Chief Complaint Chief Complaint  Patient presents with  . Facial Swelling    HPI Emily Cameron is a 55 y.o. female who presents with dental pain. PMH significant for hx of DVT/PE currently on Warfarin, COPD (current smoker), hx of polysubstance abuse, anxiety/depression. She reports left lower dental pain with associated facial swelling which started last night. She states that she has not seen a dentist in approximately 2 years. She also has been sick with "the flu" for the past week. Reports fever and chills, drainage in the mouth, difficulty swallowing, drooling. Denies ear pain, sore throat, chest pain, SOB, cough.  HPI  Past Medical History:  Diagnosis Date  . Alcohol abuse   . Anxiety   . COPD (chronic obstructive pulmonary disease) ([place])   . Depression   . History of kidney stones   . Insomnia   . PE (pulmonary embolism) 2006  . Polysubstance abuse    Current smoking and alcohol. 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 Marijuna not taking since 7 month.   . Weight loss    due to anxiety    Patient Active Problem List   Diagnosis Date Noted  . Chronic pain syndrome 05/08/2015  . Preventative health care 07/04/2014  . DVT (deep venous thrombosis) ([place]) 12/24/2013  . Blurry vision, bilateral 09/24/2013  . Hoarseness of voice 09/24/2013  . MDD (major depressive disorder) 05/01/2013  . Hypoglycemia 05/08/2012  . Hypotension 05/03/2012  . COPD (chronic obstructive pulmonary disease) ([place]) 03/31/2012  . Sleep apnea 09/06/2011  . Constipation 09/06/2011  . Anxiety 09/06/2011  . Vitamin D deficiency 09/06/2011  . Chest pain 05/03/2011  . Reflux 05/03/2011  . Tobacco abuse 03/19/2011  . Monitoring for long-term anticoagulant use 03/09/2011  . Pulmonary embolism ([place]) 03/05/2011  . Pulmonary nodule 03/05/2011  . HYPERLIPIDEMIA 12/12/2008  . SHOULDER  PAIN, RIGHT 05/27/2008  . ALLERGIC RHINITIS DUE TO POLLEN 04/15/2008  . DEPRESSION 06/29/2007  . INSOMNIA 06/29/2007    Past Surgical History:  Procedure Laterality Date  . ABDOMINAL HYSTERECTOMY    . CYST EXCISION    . LITHOTRIPSY    . MICROLARYNGOSCOPY Left 06/20/2014   Procedure: MICROLARYNGOSCOPY WITH REMOVAL OF LEFT VOCAL CORD MASS;  Surgeon: Melissa Montane, MD;  Location: [facility];  Service: ENT;  Laterality: Left;    OB History    No data available       Home Medications    Prior to Admission medications   Medication Sig Start Date End Date Taking? Authorizing Provider  ARIPiprazole (ABILIFY) 5 MG tablet Take 1 tablet (5 mg total) by mouth daily. Needs office visit for refills 11/07/15  Yes Olugbemiga E Doreene Burke, MD  diphenhydramine-acetaminophen (TYLENOL PM) 25-500 MG TABS tablet Take 1 tablet by mouth at bedtime as needed (sleep).   Yes Historical Provider, MD  DM-Doxylamine-Acetaminophen (NYQUIL COLD & FLU PO) Take 15 mLs by mouth at bedtime as needed (sleep).   Yes Historical Provider, MD  Ibuprofen-Diphenhydramine HCl (ADVIL PM) 200-25 MG CAPS Take 1 capsule by mouth at bedtime as needed (sleep).   Yes Historical Provider, MD  megestrol (MEGACE) 40 MG/ML suspension TAKE 10 MLS BY MOUTH 2 TIMES A WEEK. 02/26/16  Yes Tresa Garter, MD  mirtazapine (REMERON) 15 MG tablet Take 1 tablet (15 mg total) by mouth at bedtime. 02/26/16  Yes Tresa Garter, MD  potassium  chloride (K-DUR) 10 MEQ tablet Take 1 tablet (10 mEq total) by mouth daily. 09/16/14  Yes Tresa Garter, MD  Tetrahydrozoline HCl (VISINE OP) Place 2 drops into both eyes as needed (for dry eyes). Reported on 09/09/2015   Yes Historical Provider, MD  traMADol (ULTRAM) 50 MG tablet Take 1 tablet (50 mg total) by mouth every 8 (eight) hours as needed. 05/26/16  Yes Tresa Garter, MD  traZODone (DESYREL) 100 MG tablet TAKE 1 TABLET BY MOUTH AT BEDTIME AS NEEDED FOR SLEEP. 02/26/16  Yes Tresa Garter, MD    warfarin (COUMADIN) 5 MG tablet Take 1 tablet (5 mg total) by mouth daily. Except take 1.5 tablets on Tuesdays. 02/26/16  Yes Tresa Garter, MD    Family History Family History  Problem Relation Age of Onset  . Aneurysm Brother 31  . Aneurysm Paternal Aunt 72    Died   . Coronary artery disease Sister   . Heart disease Mother   . Hyperlipidemia Mother   . Hypertension Mother   . Heart disease Maternal Grandmother   . Pulmonary embolism Brother     Social History Social History  Substance Use Topics  . Smoking status: Current Every Day Smoker    Packs/day: 0.50    Years: 31.00    Types: Cigarettes  . Smokeless tobacco: Never Used     Comment: TO LET DOCTOR KNOW WHEN READY TO QUIT.  Marland Kitchen Alcohol use Yes     Comment: 09/09/15 last alcohol 2 weeks ago, one 24oz can     Allergies   Review of patient's allergies indicates no known allergies.   Review of Systems Review of Systems  Constitutional: Positive for chills and fever.  HENT: Positive for congestion, dental problem, drooling, rhinorrhea and trouble swallowing. Negative for ear pain, mouth sores and sore throat.   Respiratory: Negative for shortness of breath.   Cardiovascular: Negative for chest pain.  All other systems reviewed and are negative.    Physical Exam Updated Vital Signs BP 123/75 (BP Location: Right Arm)   Pulse 80   Temp 98.1 F (36.7 C) (Oral)   Resp 20   Ht 5\' 3"  (1.6 m)   Wt 49.9 kg   LMP 01/26/2004   SpO2 100%   BMI 19.49 kg/m   Physical Exam  Constitutional: She is oriented to person, place, and time. She appears well-developed and well-nourished. No distress.  Appears uncomfortable and in pain  HENT:  Head: Normocephalic and atraumatic.  Right Ear: Hearing, tympanic membrane, external ear and ear canal normal.  Left Ear: Hearing, tympanic membrane, external ear and ear canal normal.  Nose: Mucosal edema and rhinorrhea present.  Mouth/Throat: Uvula is midline and mucous  membranes are normal. There is trismus in the jaw. Abnormal dentition. Dental abscesses and dental caries present. No oropharyngeal exudate, posterior oropharyngeal erythema or tonsillar abscesses.  Mild trismus; Tenderness and erythema on the buccal gingiva without evidence of abscess  Eyes: Conjunctivae are normal. Pupils are equal, round, and reactive to light. Right eye exhibits no discharge. Left eye exhibits no discharge. No scleral icterus.  Neck: Normal range of motion. Neck supple.  Cardiovascular: Normal rate and regular rhythm.   No murmur heard. Pulmonary/Chest: Effort normal and breath sounds normal. No respiratory distress.  Abdominal: Soft. She exhibits no distension. There is no tenderness.  Musculoskeletal: She exhibits no edema.  Neurological: She is alert and oriented to person, place, and time.  Skin: Skin is warm and dry.  Psychiatric:  She has a normal mood and affect. Her behavior is normal.  Nursing note and vitals reviewed.    ED Treatments / Results  Labs (all labs ordered are listed, but only abnormal results are displayed) Labs Reviewed - No data to display  EKG  EKG Interpretation None       Radiology No results found.  Procedures Procedures (including critical care time)  NERVE BLOCK Performed by: Recardo Evangelist Consent: Verbal consent obtained. Required items: required blood products, implants, devices, and special equipment available Time out: Immediately prior to procedure a "time out" was called to verify the correct patient, procedure, equipment, support staff and site/side marked as required.  Indication: dental pain Nerve block body site: left infraalveolar nerve  Preparation: Patient was prepped and draped in the usual sterile fashion. Needle gauge: 44 G Location technique: anatomical landmarks  Local anesthetic: bupivacaine w epi  Anesthetic total: 1.8 ml  Outcome: pain improved Patient tolerance: Patient tolerated the  procedure well with no immediate complications.   INCISION AND DRAINAGE Performed by: Recardo Evangelist Consent: Verbal consent obtained. Risks and benefits: risks, benefits and alternatives were discussed Type: abscess  Body area: left lower molar  Anesthesia: local infiltration  Incision was made with a 18 g needle.  Local anesthetic: bupivacaine 0.5% with epinephrine  Anesthetic total: 1.8 ml  Complexity: simple Blunt dissection to break up loculations  Drainage: purulent  Drainage amount: <1cc  Packing material: none  Patient tolerance: Patient tolerated the procedure well with no immediate complications.     Medications Ordered in ED Medications  bupivacaine-epinephrine (MARCAINE W/ EPI) 0.5% -1:200000 injection 1.8 mL (1.8 mLs Infiltration Given 06/14/16 1654)  amoxicillin-clavulanate (AUGMENTIN) 875-125 MG per tablet 1 tablet (1 tablet Oral Given 06/14/16 1655)  bupivacaine-epinephrine (MARCAINE W/ EPI) 0.5% -1:200000 injection 1.8 mL (1.8 mLs Infiltration Given 06/14/16 1709)     Initial Impression / Assessment and Plan / ED Course  I have reviewed the triage vital signs and the nursing notes.  Pertinent labs & imaging results that were available during my care of the patient were reviewed by me and considered in my medical decision making (see chart for details).  Clinical Course   55 year old female presents with dental abscess. Patient is afebrile, not tachycardic or tachypneic, normotensive, and not hypoxic. She has mild trismus on exam with facial swelling. Augmentin given in ED. Dental block performed with I&D of abscess with good relief of pain per patient.   Advised Ibuprofen for pain and dental follow up. Patient became angry when I offered this stating she wants something stronger. She became irate when I explained narcotic medicines are not indicated for this and when asked to wait for her prescription for antibiotics she refused and  eloped.  Final Clinical Impressions(s) / ED Diagnoses   Final diagnoses:  Dental abscess    New Prescriptions New Prescriptions   No medications on file     [facility]ardo Evangelist, PA-C 06/14/16 Cheral Marker, MD 06/15/16 2130

## 2016-06-14 NOTE — ED Triage Notes (Signed)
Pt states for the last 2 days has had right sided facial pain and this am had swelling to right jaw and side of face.

## 2016-06-17 ENCOUNTER — Encounter: Payer: Self-pay | Admitting: Pharmacist

## 2016-06-24 ENCOUNTER — Ambulatory Visit: Payer: Medicaid Other | Attending: Internal Medicine | Admitting: Pharmacist

## 2016-06-24 ENCOUNTER — Telehealth: Payer: Self-pay | Admitting: Internal Medicine

## 2016-06-24 DIAGNOSIS — Z7901 Long term (current) use of anticoagulants: Principal | ICD-10-CM

## 2016-06-24 DIAGNOSIS — Z5181 Encounter for therapeutic drug level monitoring: Secondary | ICD-10-CM

## 2016-06-24 DIAGNOSIS — I2699 Other pulmonary embolism without acute cor pulmonale: Secondary | ICD-10-CM

## 2016-06-24 LAB — POCT INR: INR: 1.1

## 2016-06-24 MED FILL — traZODone HCL 100 MG TABS: 100 | 30 days supply | Qty: 30 | Fill #0

## 2016-06-24 MED FILL — MEGESTROL ACET 40 MG/ML SUS: 40 | 30 days supply | Qty: 80 | Fill #0

## 2016-06-24 MED FILL — ARIPiprazole 5 MG TABS: 5 | 30 days supply | Qty: 30 | Fill #1

## 2016-06-24 MED FILL — WARFARIN SODIUM 5 MG TABLET: 5 | 30 days supply | Qty: 32 | Fill #2

## 2016-06-24 MED FILL — MIRTAZAPINE 15 MG TABLET: 15 | 30 days supply | Qty: 30 | Fill #0

## 2016-06-24 NOTE — Telephone Encounter (Signed)
Pt. Came into facility requesting a refill on Tramadol. Please f/u °

## 2016-06-25 ENCOUNTER — Other Ambulatory Visit: Payer: Self-pay | Admitting: Internal Medicine

## 2016-06-25 DIAGNOSIS — G894 Chronic pain syndrome: Secondary | ICD-10-CM

## 2016-06-25 MED ORDER — TRAMADOL HCL 50 MG PO TABS: 50.0000 mg | ORAL_TABLET | Freq: Three times a day (TID) | ORAL | 0 refills | Status: DC | PRN

## 2016-06-29 MED FILL — traMADol HCL 50 MG TABS: 50 | 20 days supply | Qty: 60 | Fill #0

## 2016-07-01 ENCOUNTER — Encounter: Payer: Self-pay | Admitting: Pharmacist

## 2016-07-22 ENCOUNTER — Encounter: Payer: Self-pay | Admitting: Pharmacist

## 2016-07-27 ENCOUNTER — Telehealth: Payer: Self-pay | Admitting: Internal Medicine

## 2016-07-27 NOTE — Telephone Encounter (Signed)
Patient called requesting a refill for Tramadol. Please explain to patient that controlled medications, such as pain med are to be requested when refills are needed.  Please follow up.

## 2016-07-27 NOTE — Telephone Encounter (Signed)
Patient called about request for Tramadol

## 2016-07-28 ENCOUNTER — Telehealth: Payer: Self-pay | Admitting: Internal Medicine

## 2016-07-28 NOTE — Telephone Encounter (Signed)
Patient called requesting Rx:     Tramadol                         Please call patient when Rx ready for pick up.

## 2016-07-29 ENCOUNTER — Ambulatory Visit: Payer: Medicaid Other | Attending: Internal Medicine | Admitting: Pharmacist

## 2016-07-29 ENCOUNTER — Other Ambulatory Visit: Payer: Self-pay | Admitting: Internal Medicine

## 2016-07-29 ENCOUNTER — Encounter: Payer: Self-pay | Admitting: Pharmacist

## 2016-07-29 DIAGNOSIS — I2699 Other pulmonary embolism without acute cor pulmonale: Secondary | ICD-10-CM

## 2016-07-29 DIAGNOSIS — Z7901 Long term (current) use of anticoagulants: Principal | ICD-10-CM

## 2016-07-29 DIAGNOSIS — G894 Chronic pain syndrome: Secondary | ICD-10-CM

## 2016-07-29 DIAGNOSIS — Z5181 Encounter for therapeutic drug level monitoring: Secondary | ICD-10-CM

## 2016-07-29 LAB — POCT INR: INR: 3.4

## 2016-07-29 MED ORDER — TRAMADOL HCL 50 MG PO TABS: 50.0000 mg | ORAL_TABLET | Freq: Three times a day (TID) | ORAL | 0 refills | Status: DC | PRN

## 2016-07-29 MED FILL — traMADol HCL 50 MG TABS: 50 | 20 days supply | Qty: 60 | Fill #0

## 2016-09-01 ENCOUNTER — Telehealth: Payer: Self-pay | Admitting: Internal Medicine

## 2016-09-01 NOTE — Telephone Encounter (Signed)
Patient is requesting medication refill for tramadol

## 2016-09-02 ENCOUNTER — Ambulatory Visit: Payer: Medicaid Other | Attending: Internal Medicine | Admitting: Pharmacist

## 2016-09-02 ENCOUNTER — Other Ambulatory Visit: Payer: Self-pay | Admitting: Internal Medicine

## 2016-09-02 DIAGNOSIS — G894 Chronic pain syndrome: Secondary | ICD-10-CM

## 2016-09-02 DIAGNOSIS — I2699 Other pulmonary embolism without acute cor pulmonale: Secondary | ICD-10-CM

## 2016-09-02 DIAGNOSIS — Z5181 Encounter for therapeutic drug level monitoring: Secondary | ICD-10-CM

## 2016-09-02 DIAGNOSIS — Z7901 Long term (current) use of anticoagulants: Principal | ICD-10-CM

## 2016-09-02 LAB — POCT INR: INR: 1.1

## 2016-09-02 MED ORDER — TRAMADOL HCL 50 MG PO TABS: 50.0000 mg | ORAL_TABLET | Freq: Three times a day (TID) | ORAL | 0 refills | Status: DC | PRN

## 2016-09-08 ENCOUNTER — Ambulatory Visit: Payer: Self-pay | Admitting: Internal Medicine

## 2016-09-09 ENCOUNTER — Ambulatory Visit: Payer: Self-pay | Admitting: Pharmacist

## 2016-09-14 ENCOUNTER — Ambulatory Visit: Payer: Self-pay | Admitting: Pharmacist

## 2016-09-30 ENCOUNTER — Ambulatory Visit: Payer: Self-pay | Admitting: Pharmacist

## 2016-10-07 ENCOUNTER — Encounter: Payer: Self-pay | Admitting: Pharmacist

## 2016-11-11 ENCOUNTER — Ambulatory Visit: Payer: Medicaid Other | Attending: Internal Medicine | Admitting: Pharmacist

## 2016-11-11 DIAGNOSIS — I2699 Other pulmonary embolism without acute cor pulmonale: Secondary | ICD-10-CM

## 2016-11-11 DIAGNOSIS — Z5181 Encounter for therapeutic drug level monitoring: Secondary | ICD-10-CM

## 2016-11-11 DIAGNOSIS — Z7901 Long term (current) use of anticoagulants: Principal | ICD-10-CM

## 2016-11-11 LAB — POCT INR: INR: 1.1

## 2016-11-11 NOTE — Progress Notes (Signed)
Patient noncompliant with warfarin - reviewed the risks of noncompliance, including the risk of recurrent clot and possible death. Patient verbalized understanding and reports that she will try to be better.

## 2016-11-17 ENCOUNTER — Ambulatory Visit: Payer: Medicaid Other | Attending: Internal Medicine | Admitting: Internal Medicine

## 2016-11-17 VITALS — BP 117/71 | HR 82 | Temp 98.6°F | Resp 18 | Ht 63.0 in | Wt 118.8 lb

## 2016-11-17 DIAGNOSIS — Z1239 Encounter for other screening for malignant neoplasm of breast: Secondary | ICD-10-CM

## 2016-11-17 DIAGNOSIS — F17210 Nicotine dependence, cigarettes, uncomplicated: Secondary | ICD-10-CM | POA: Insufficient documentation

## 2016-11-17 DIAGNOSIS — F3289 Other specified depressive episodes: Secondary | ICD-10-CM

## 2016-11-17 DIAGNOSIS — Z1231 Encounter for screening mammogram for malignant neoplasm of breast: Secondary | ICD-10-CM

## 2016-11-17 DIAGNOSIS — F329 Major depressive disorder, single episode, unspecified: Secondary | ICD-10-CM | POA: Diagnosis not present

## 2016-11-17 DIAGNOSIS — G4700 Insomnia, unspecified: Secondary | ICD-10-CM | POA: Insufficient documentation

## 2016-11-17 DIAGNOSIS — F411 Generalized anxiety disorder: Secondary | ICD-10-CM | POA: Insufficient documentation

## 2016-11-17 DIAGNOSIS — Z87442 Personal history of urinary calculi: Secondary | ICD-10-CM | POA: Insufficient documentation

## 2016-11-17 DIAGNOSIS — G894 Chronic pain syndrome: Secondary | ICD-10-CM | POA: Diagnosis not present

## 2016-11-17 DIAGNOSIS — Z7901 Long term (current) use of anticoagulants: Secondary | ICD-10-CM | POA: Diagnosis not present

## 2016-11-17 DIAGNOSIS — I2699 Other pulmonary embolism without acute cor pulmonale: Secondary | ICD-10-CM | POA: Diagnosis not present

## 2016-11-17 DIAGNOSIS — J449 Chronic obstructive pulmonary disease, unspecified: Secondary | ICD-10-CM | POA: Diagnosis not present

## 2016-11-17 MED ORDER — MIRTAZAPINE 15 MG PO TABS: 15.0000 mg | ORAL_TABLET | Freq: Every day | ORAL | 3 refills | Status: DC

## 2016-11-17 MED ORDER — TRAMADOL HCL 50 MG PO TABS: 50.0000 mg | ORAL_TABLET | Freq: Three times a day (TID) | ORAL | 0 refills | Status: DC | PRN

## 2016-11-17 MED ORDER — ARIPIPRAZOLE 5 MG PO TABS: ORAL_TABLET | ORAL | 3 refills | Status: DC

## 2016-11-17 MED ORDER — WARFARIN SODIUM 5 MG PO TABS: 5.0000 mg | ORAL_TABLET | Freq: Every day | ORAL | 3 refills | Status: DC

## 2016-11-17 NOTE — Progress Notes (Signed)
Emily Cameron, is a 56 y.o. female  PHX:[id number]  AXK:[id number]  DOB - [birth year]  Chief Complaint  Patient presents with  . Establish Care      Subjective:   Emily Cameron is a 56 y.o. female with history of pulmonary embolism on chronic anticoagulation, COPD, generalized anxiety and depression, remote polysubstance abuse history here today to reestablish medical care after being lost to follow-up for > 2 years. Patient is extremely non-adherent with medications and follow up. She hardly had therapeutic INR. She has no new complaint today, just wants to get back on her medications and "do things right". Patient has No headache, No chest pain, No abdominal pain - No Nausea, No new weakness tingling or numbness, No Cough - SOB. She continues to smoke cigarette.  Problem  Other Pulmonary Embolism Without Acute Cor Pulmonale (Hcc)   Indication: Recurrent Pulmonary emboli (2006 and 2012)  Duration Life long     Other Depressive Episodes   Qualifier: Diagnosis of  By: Jerilee Hoh MD, Est[place]       ALLERGIES: No Known Allergies  PAST MEDICAL HISTORY: Past Medical History:  Diagnosis Date  . Alcohol abuse   . Anxiety   . COPD (chronic obstructive pulmonary disease) ([place])   . Depression   . History of kidney stones   . Insomnia   . PE (pulmonary embolism) 2006  . Polysubstance abuse    Current smoking and alcohol. 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 Marijuna not taking since 7 month.   . Weight loss    due to anxiety    MEDICATIONS AT HOME: Prior to Admission medications   Medication Sig Start Date End Date Taking? Authorizing Provider  ARIPiprazole (ABILIFY) 5 MG tablet TAKE 1 TABLET BY MOUTH DAILY. NEEDS OFFICE VISIT FOR REFILLS 11/17/16  Yes Tresa Garter, MD  megestrol (MEGACE) 40 MG/ML suspension TAKE 10 MLS BY MOUTH 2 TIMES A WEEK. 07/29/16  Yes Tresa Garter, MD  mirtazapine (REMERON) 15 MG tablet Take 1 tablet (15 mg total) by mouth at  bedtime. 11/17/16  Yes Tresa Garter, MD  potassium chloride (K-DUR) 10 MEQ tablet TAKE 1 TABLET BY MOUTH DAILY. 07/29/16  Yes Tresa Garter, MD  QUEtiapine (SEROQUEL) 400 MG tablet TK 1 T PO QHS 10/31/16  Yes Historical Provider, MD  Tetrahydrozoline HCl (VISINE OP) Place 2 drops into both eyes as needed (for dry eyes). Reported on 09/09/2015   Yes Historical Provider, MD  traMADol (ULTRAM) 50 MG tablet Take 1 tablet (50 mg total) by mouth every 8 (eight) hours as needed. 11/17/16  Yes Tresa Garter, MD  warfarin (COUMADIN) 5 MG tablet Take 1 tablet (5 mg total) by mouth daily. Except take 1.5 tablets on Tuesdays. 11/17/16  Yes Tresa Garter, MD  zolpidem (AMBIEN) 10 MG tablet TK 1 T PO QD HS 10/31/16  Yes Historical Provider, MD    Objective:   Vitals:   11/17/16 1626  BP: 117/71  Pulse: 82  Resp: 18  Temp: 98.6 F (37 C)  TempSrc: Oral  SpO2: 100%  Weight: 118 lb 12.8 oz (53.9 kg)  Height: _0  (1.6 m)   Exam General appearance : Awake, alert, not in any distress. Speech Clear. Not toxic looking, thin built HEENT: Atraumatic and Normocephalic, pupils equally reactive to light and accomodation Neck: Supple, no JVD. No cervical lymphadenopathy.  Chest: Good air entry bilaterally, no added sounds  CVS: S1 S2 regular, no murmurs.  Abdomen: Bowel  sounds present, Non tender and not distended with no gaurding, rigidity or rebound. Extremities: B/L Lower Ext shows no edema, both legs are warm to touch Neurology: Awake alert, and oriented X 3, CN II-XII intact, Non focal Skin: No Rash  Data Review No results found for: HGBA1C  Assessment & Plan   1. Chronic pain syndrome  - traMADol (ULTRAM) 50 MG tablet; Take 1 tablet (50 mg total) by mouth every 8 (eight) hours as needed.  Dispense: 60 tablet; Refill: 0  2. Other pulmonary embolism without acute cor pulmonale, unspecified chronicity (HCC)  - warfarin (COUMADIN) 5 MG tablet; Take 1 tablet (5 mg total) by  mouth daily. Except take 1.5 tablets on Tuesdays.  Dispense: 32 tablet; Refill: 3  3. Other depressive episodes  - ARIPiprazole (ABILIFY) 5 MG tablet; TAKE 1 TABLET BY MOUTH DAILY. NEEDS OFFICE VISIT FOR REFILLS  Dispense: 30 tablet; Refill: 3 - mirtazapine (REMERON) 15 MG tablet; Take 1 tablet (15 mg total) by mouth at bedtime.  Dispense: 30 tablet; Refill: 3  - CBC with Differential/Platelet - CMP14+EGFR - Lipid panel - TSH - Urinalysis, Complete  4. Breast cancer screening  - MM Digital Screening; Future  Fritzi was counseled on the dangers of tobacco use, and was advised to quit. Reviewed strategies to maximize success, including removing cigarettes and smoking materials from environment, stress management and support of family/friends.  Patient have been counseled extensively about nutrition and exercise. Other issues discussed during this visit include: low cholesterol diet, weight control and daily exercise, importance of adherence with medications and regular follow-up.   Return in about 2 weeks (around 12/01/2016) for To see Stacey (CPP) for INR Check.  The patient was given clear instructions to go to ER or return to medical center if symptoms don't improve, worsen or new problems develop. The patient verbalized understanding. The patient was told to call to get lab results if they haven't heard anything in the next week.   This note has been created with Surveyor, quantity. Any transcriptional errors are unintentional.    [name], MD, [place], Karilyn Cota, [place] [facility] [place], [place]   11/17/2016, 4:50 PM

## 2016-11-17 NOTE — Progress Notes (Signed)
Patient is here for FU reestablish care  Patient denies pain at this time.  Patient denies any suicidal ideations at this time.  Patient has not taken medication today. Patient has not eaten today.

## 2016-11-17 NOTE — Patient Instructions (Signed)
What You Need to Know About Warfarin Warfarin is a blood thinner (anticoagulant). Anticoagulants help to prevent the formation of blood clots. They also help to stop the growth of blood clots. Who should use warfarin? Warfarin is prescribed for people who are at risk for developing harmful blood clots, such as people who have:  Surgically implanted mechanical heart valves.  Irregular heart rhythms (atrial fibrillation).  Certain clotting disorders.  A history of harmful blood clotting in the past. This includes people who have had:  A stroke.  Blood clot in the lungs (pulmonary embolism, or PE).  Blood clot in the legs (deep vein thrombosis, or DVT).  An existing blood clot. How is warfarin taken?   Warfarin is a medicine that you take by mouth (orally). Warfarin tablets come in different strengths. Each tablet strength is a different color, with the amount of warfarin printed on the tablet. If you get a new prescription filled and the color of your tablet is different than usual, tell your pharmacist or health care provider immediately. What blood tests do I need while taking warfarin? The goal of warfarin therapy is to lessen the clotting tendency of blood, but not to prevent clotting completely. Your health care provider will monitor the anticoagulation effect of warfarin closely and will adjust your dose as needed. Warfarin is a medicine that needs to be closely monitored, so it is very important to keep all lab visits and follow-up visits with your health care provider. While taking warfarin, you will need to have blood tests (prothrombin tests, or PT tests) regularly to measure your blood clotting time. This type of test can be done with a finger stick or a blood draw. What does the INR test result mean?  The PT test results will be reported as the International Normalized Ratio (INR). The INR tells your health care provider whether your dosage of warfarin needs to be changed. The  longer it takes your blood to clot, the higher the INR. Your health care provider will tell you your target INR range. If your INR is not in your target range, your health care provider may adjust your dosage.  If your INR is above your target range, there is a risk of bleeding. Your dosage of warfarin may need to be decreased.  If your INR is below your target range, there is a risk of clotting. Your dosage of warfarin may need to be increased. How often is the INR test needed?   When you first start warfarin, you will usually have your INR checked every few days.  You may need to have INR tests done more than once a week until you are taking the correct dosage of warfarin.  After you have reached your target INR, your INR will be tested less often. However, you will need to have your INR checked at least once every 4-6 weeks for the entire time you are taking warfarin. What are the side effects of warfarin? Too much warfarin can cause bleeding (hemorrhage) in any part of the body, such as:  Bleeding from the gums.  Unexplained bruises.  Bruises that get larger.  Blood in the urine.  Bloody or dark stools.  Bleeding in the brain (hemorrhagic stroke).  A nosebleed that is not easily stopped.  Coughing up blood.  Vomiting blood. Warfarin use may also cause:  Skin rash or irritations  Nausea that does not go away.  Severe pain in the back or joints.  Painful toes that turn blue  or purple (purple toe syndrome).  Painful ulcers that do not go away (skin necrosis). What are the signs and symptoms of a blood clot? Too little warfarin can increase the risk of blood clots in your legs, lungs, or arms. Signs and symptoms of a DVT in your leg or arm may include:  Pain or swelling in your leg or arm.  Skin that is red or warm to the touch on your arm or leg. Signs and symptoms of a pulmonary embolism may include:  Shortness of breath or difficulty breathing.  Chest  pain.  Unexplained fever. What are the signs and symptoms of a stroke? If you are taking too much or too little warfarin, you can have a stroke. Signs and symptoms of a stroke may include:  Weakness or numbness of your face, arm, or leg, especially on one side of your body.  Confusion or trouble thinking clearly.  Difficulty seeing with one or both eyes.  Difficulty walking or moving your arms or legs.  Dizziness.  Loss of balance or coordination.  Trouble speaking, trouble understanding speech, or both (aphasia).  Sudden, severe headache with no known cause.  Partial or total loss of consciousness. What precautions do I need to take while using warfarin?   Take warfarin exactly as told by your health care provider. Doing this helps you avoid bleeding or blood clots that could result in serious injury, pain, or disability.  Take your medicine at the same time every day. If you forget to take your dose of warfarin, take it as soon as you remember that day. If you do not remember on that day, do not take an extra dose the next day.  Contact your health care provider if you miss or take an extra dose. Do not change your dosage on your own to make up for missed or extra doses.  Wear or carry identification that says that you are taking warfarin.  Make sure that all health care providers, including your dentist, know you are taking warfarin.  If you need surgery, talk with your health care provider about whether you should stop taking warfarin before your surgery.  Avoid situations that cause bleeding. You may bleed more easily while taking warfarin. To limit bleeding, take the following actions:  Use a softer toothbrush.  Floss with waxed floss, not unwaxed floss.  Shave with an electric razor, not with a blade.  Limit your use of sharp objects.  Avoid potentially harmful activities, such as contact sports. What do I need to know about warfarin and pregnancy or  breastfeeding?  Warfarin is not recommended during the first trimester of pregnancy due to an increased risk of birth defects. In certain situations, a woman may take warfarin after her first trimester of pregnancy.  If you are taking warfarin and you become pregnant or plan to become pregnant, contact your health care provider right away.  If you plan to breastfeed while taking warfarin, talk with your health care provider first. What do I need to know about warfarin and alcohol or drug use?  Avoid drinking alcohol, or limit alcohol intake to no more than 1 drink a day for nonpregnant women and 2 drinks a day for men. One drink equals 12 oz of beer, 5 oz of wine, or 1 oz of hard liquor.  If you change the amount of alcohol that you drink, tell your health care provider. Your warfarin dosage may need to be changed.    Avoid street drugs while  taking warfarin. The effects of street drugs on warfarin are not known. What do I need to know about warfarin and other medicines or supplements?  Many prescription and over-the-counter medicines can interfere with warfarin. Talk with your health care provider or your pharmacist before starting or stopping any new medicines. This includes over-the-counter vitamins, dietary supplements, herbal medicines, and pain medicines. Your warfarin dosage may need to be adjusted.  Some common over-the-counter medicines that may increase the risk of bleeding while taking warfarin include:  Acetaminophen.    NSAIDs, such as ibuprofen or naproxen.  Vitamin E. What do I need to know about warfarin and my diet?  It is important to maintain a normal, balanced diet while taking warfarin. Avoid major changes in your diet. If you are going to change your diet, talk with your health care provider before making changes.  Your health care provider may recommend that you work with a diet and nutrition specialist (dietitian).  Vitamin K decreases the effect of  warfarin, and it is found in many foods. Eat a consistent amount of foods that contain vitamin K. For example, you may decide to eat 2 vitamin K-containing foods each day. Most foods that are high in vitamin K are green and leafy. Common foods that contain high amounts of vitamin K include:  Kale, raw or cooked.  Spinach, raw or cooked.  Collards, raw or cooked.  Swiss chard, raw or cooked.  Mustard greens, raw or cooked.  Turnip greens, raw or cooked.  Parsley, raw.  Broccoli, cooked.  Noodles, eggs, and spinach, enriched.  Brussels sprouts, raw or cooked.  Beet greens, raw or cooked.  Endive, raw.  Cabbage, cooked.  Asparagus, cooked. Foods that contain moderate amounts of vitamin K include:  Broccoli, raw.  Cabbage, raw.  Bok choy, cooked.  Green leaf lettuce, raw  Prunes, stewed.  Emily Cameron.  Kiwi.  Edamame, cooked.  Romaine lettuce, raw.  Avocado.  Tuna, canned in oil.  Okra, cooked.  Black-eyed peas, cooked.  Green beans, cooked or raw.  Blueberries, raw.  Blackberries, raw.  Peas, cooked or raw. Contact a health care provider if:  You miss a dose.  You take an extra dose.  You plan to have any kind of surgery or procedure.  You are unable to take your medicine due to nausea, vomiting, or diarrhea.  You have any major changes in your diet or you plan to make any major changes in your diet.  You start or stop any over-the-counter medicine, prescription medicine, or dietary supplement.  You become pregnant, plan to become pregnant, or think you may be pregnant.  You have menstrual periods that are heavier than usual.  You have unusual bruising. Get help right away if:  You develop symptoms of an allergic reaction, such as:  Swelling of the lips, face, tongue, mouth, or throat.  Rash.  Itching.  Itchy, red, swollen areas of skin (hives).  Trouble breathing.  Chest tightness.  You have:  Signs or symptoms of a  stroke.  Signs or symptoms of a blood clot.  A fall or have an accident, especially if you hit your head.  Blood in your urine. Your urine may look reddish, pinkish, or tea-colored.  Blood in your stool. Your stool may be black or bright red.  Bleeding that does not stop after applying pressure to the area for 30 minutes.  Severe pain in your joints or back.  Purple or blue toes.  Skin ulcers that do not go  away.  You vomit blood or cough up blood. The blood may be bright red, or it may look like coffee grounds. These symptoms may represent a serious problem that is an emergency. Do not wait to see if the symptoms will go away. Get medical help right away. Call your local emergency services (911 in the [place] Do not drive yourself to the hospital. Summary  Warfarin needs to be closely monitored with blood tests. It is very important to keep all lab visits and follow-up visits with your health care provider.  Make sure that you know your target INR range and your warfarin dosage.  Wear or carry identification that says that you are taking warfarin.  Take warfarin at the same time every day. Call your health care provider if you miss a dose or if you take an extra dose. Do not change the dosage of warfarin on your own.  Know the signs and symptoms of blood clots, bleeding, and a stroke. Know when to get emergency medical help.  Tell all health care providers who care for you that you are taking warfarin.  Talk with your health care provider or your pharmacist before starting or stopping any new medicines.  Monitor how much vitamin K you eat every day. Try to eat the same amount every day. This information is not intended to replace advice given to you by your health care provider. Make sure you discuss any questions you have with your health care provider. Document Released: 08/09/2005 Document Revised: 04/20/2016 Document Reviewed: 11/05/2015 Elsevier Interactive Patient  Education  2017 [employer]. Major Depressive Disorder, Adult Major depressive disorder (MDD) is a mental health condition. MDD often makes you feel sad, hopeless, or helpless. MDD can also cause symptoms in your body. MDD can affect your:  Work.  School.  Relationships.  Other normal activities. MDD can range from mild to very bad. It may occur once (single episode MDD). It can also occur many times (recurrent MDD). The main symptoms of MDD often include:  Feeling sad, depressed, or irritable most of the time.  Loss of interest. MDD symptoms also include:  Sleeping too much or too little.  Eating too much or too little.  A change in your weight.  Feeling tired (fatigue) or having low energy.  Feeling worthless.  Feeling guilty.  Trouble making decisions.  Trouble thinking clearly.  Thoughts of suicide or harming others.  Feeling weak.  Feeling agitated.  Keeping yourself from being around other people (isolation). Follow these instructions at home: Activity   Do these things as told by your doctor:  Go back to your normal activities.  Exercise regularly.  Spend time outdoors. Alcohol   Talk with your doctor about how alcohol can affect your antidepressant medicines.  Do not drink alcohol. Or, limit how much alcohol you drink.  This means no more than 1 drink a day for nonpregnant women and 2 drinks a day for men. One drink equals one of these:  12 oz of beer.  5 oz of wine.  1 oz of hard liquor. General instructions   Take over-the-counter and prescription medicines only as told by your doctor.  Eat a healthy diet.  Get plenty of sleep.  Find activities that you enjoy. Make time to do them.  Think about joining a support group. Your doctor may be able to suggest a group for you.  Keep all follow-up visits as told by your doctor. This is important. Where to find more information:  National Alliance on Mental  Illness:  www.nami.org  [place] National Institute of Mental Health:  https://carter.com/  National Suicide Prevention Lifeline:  (713)752-2269. This is free, 24-hour help. Contact a doctor if:  Your symptoms get worse.  You have new symptoms. Get help right away if:  You self-harm.  You see, hear, taste, smell, or feel things that are not present (hallucinate). If you ever feel like you may hurt yourself or others, or have thoughts about taking your own life, get help right away. You can go to your nearest emergency department or call:  Your local emergency services (911 in the [place]  A suicide crisis helpline, such as the [facility]:  917 758 3580. This is open 24 hours a day. This information is not intended to replace advice given to you by your health care provider. Make sure you discuss any questions you have with your health care provider. Document Released: 07/21/2015 Document Revised: 04/25/2016 Document Reviewed: 04/25/2016 Elsevier Interactive Patient Education  2017 [employer].

## 2016-11-18 ENCOUNTER — Telehealth: Payer: Self-pay | Admitting: *Deleted

## 2016-11-18 LAB — CBC WITH DIFFERENTIAL/PLATELET
BASOS ABS: 0 10*3/uL (ref 0.0–0.2)
BASOS: 0 %
EOS (ABSOLUTE): 0.1 10*3/uL (ref 0.0–0.4)
Eos: 1 %
Hematocrit: 40.6 % (ref 34.0–46.6)
Hemoglobin: 14 g/dL (ref 11.1–15.9)
IMMATURE GRANS (ABS): 0 10*3/uL (ref 0.0–0.1)
IMMATURE GRANULOCYTES: 0 %
LYMPHS: 43 %
Lymphocytes Absolute: 3.7 10*3/uL — ABNORMAL HIGH (ref 0.7–3.1)
MCH: 32 pg (ref 26.6–33.0)
MCHC: 34.5 g/dL (ref 31.5–35.7)
MCV: 93 fL (ref 79–97)
Monocytes Absolute: 0.6 10*3/uL (ref 0.1–0.9)
Monocytes: 7 %
NEUTROS PCT: 49 %
Neutrophils Absolute: 4.1 10*3/uL (ref 1.4–7.0)
PLATELETS: 235 10*3/uL (ref 150–379)
RBC: 4.37 x10E6/uL (ref 3.77–5.28)
RDW: 13.7 % (ref 12.3–15.4)
WBC: 8.4 10*3/uL (ref 3.4–10.8)

## 2016-11-18 LAB — CMP14+EGFR
ALK PHOS: 77 IU/L (ref 39–117)
ALT: 14 IU/L (ref 0–32)
AST: 19 IU/L (ref 0–40)
Albumin/Globulin Ratio: 1.5 (ref 1.2–2.2)
Albumin: 4.5 g/dL (ref 3.5–5.5)
BILIRUBIN TOTAL: 0.3 mg/dL (ref 0.0–1.2)
BUN / CREAT RATIO: 10 (ref 9–23)
BUN: 7 mg/dL (ref 6–24)
CALCIUM: 9.3 mg/dL (ref 8.7–10.2)
CHLORIDE: 104 mmol/L (ref 96–106)
CO2: 24 mmol/L (ref 18–29)
Creatinine, Ser: 0.68 mg/dL (ref 0.57–1.00)
GFR calc Af Amer: 115 mL/min/{1.73_m2} (ref >59)
GFR calc non Af Amer: 100 mL/min/{1.73_m2} (ref >59)
GLUCOSE: 74 mg/dL (ref 65–99)
Globulin, Total: 3.1 g/dL (ref 1.5–4.5)
POTASSIUM: 3.6 mmol/L (ref 3.5–5.2)
Sodium: 145 mmol/L — ABNORMAL HIGH (ref 134–144)
TOTAL PROTEIN: 7.6 g/dL (ref 6.0–8.5)

## 2016-11-18 LAB — LIPID PANEL
CHOLESTEROL TOTAL: 241 mg/dL — AB (ref 100–199)
Chol/HDL Ratio: 3.3 ratio units (ref 0.0–4.4)
HDL: 74 mg/dL (ref >39)
LDL Calculated: 142 mg/dL — ABNORMAL HIGH (ref 0–99)
Triglycerides: 123 mg/dL (ref 0–149)
VLDL CHOLESTEROL CAL: 25 mg/dL (ref 5–40)

## 2016-11-18 LAB — TSH: TSH: 1.69 u[IU]/mL (ref 0.450–4.500)

## 2016-11-18 NOTE — Telephone Encounter (Signed)
Patient verified DOB Patient is aware of labs being normal except for cholesterol. Patient advised to limit saturated fat intake and increase fiber and exercise. Patient expressed her understanding and had no further questions at this time.

## 2016-11-18 NOTE — Telephone Encounter (Signed)
-----   Message from Tresa Garter, MD sent at 11/18/2016  1:57 PM EDT ----- Normal lab results except for high cholesterol level. To address this please limit saturated fat to no more than 7% of your calories, limit cholesterol to 200 mg/day, increase fiber and exercise as tolerated. If needed we may add a cholesterol lowering medication to your regimen.

## 2016-11-22 MED FILL — MIRTAZAPINE 15 MG TABLET: 15 | 30 days supply | Qty: 30 | Fill #0

## 2016-11-22 MED FILL — ARIPiprazole 5 MG TABS: 5 | 30 days supply | Qty: 30 | Fill #0

## 2016-11-22 MED FILL — WARFARIN SODIUM 5 MG TABLET: 5 | 30 days supply | Qty: 32 | Fill #0

## 2016-11-22 MED FILL — traMADol HCL 50 MG TABS: 50 | 20 days supply | Qty: 60 | Fill #0

## 2016-11-30 ENCOUNTER — Ambulatory Visit: Payer: Medicaid Other | Admitting: Pharmacist

## 2016-12-07 ENCOUNTER — Encounter: Payer: Self-pay | Admitting: Pharmacist

## 2016-12-21 ENCOUNTER — Telehealth: Payer: Self-pay | Admitting: Internal Medicine

## 2016-12-21 NOTE — Telephone Encounter (Signed)
Patient called the office requesting medication refill for traMADol (ULTRAM) 50 MG tablet.  Thank you.

## 2016-12-22 ENCOUNTER — Other Ambulatory Visit: Payer: Self-pay | Admitting: Internal Medicine

## 2016-12-22 DIAGNOSIS — G894 Chronic pain syndrome: Secondary | ICD-10-CM

## 2016-12-22 MED ORDER — TRAMADOL HCL 50 MG PO TABS: 50.0000 mg | ORAL_TABLET | Freq: Three times a day (TID) | ORAL | 0 refills | Status: DC | PRN

## 2016-12-29 ENCOUNTER — Ambulatory Visit: Payer: Medicaid Other | Attending: Internal Medicine | Admitting: Pharmacist

## 2016-12-29 DIAGNOSIS — I2699 Other pulmonary embolism without acute cor pulmonale: Secondary | ICD-10-CM

## 2016-12-29 DIAGNOSIS — Z5181 Encounter for therapeutic drug level monitoring: Secondary | ICD-10-CM

## 2016-12-29 DIAGNOSIS — Z7901 Long term (current) use of anticoagulants: Principal | ICD-10-CM

## 2017-01-05 ENCOUNTER — Encounter: Payer: Self-pay | Admitting: Pharmacist

## 2017-01-05 ENCOUNTER — Ambulatory Visit: Payer: Self-pay | Admitting: Internal Medicine

## 2017-01-19 ENCOUNTER — Ambulatory Visit: Payer: Self-pay | Admitting: Internal Medicine

## 2017-01-20 ENCOUNTER — Ambulatory Visit: Payer: Medicaid Other | Attending: Internal Medicine | Admitting: Pharmacist

## 2017-01-20 ENCOUNTER — Telehealth: Payer: Self-pay | Admitting: Internal Medicine

## 2017-01-20 DIAGNOSIS — Z5181 Encounter for therapeutic drug level monitoring: Secondary | ICD-10-CM

## 2017-01-20 DIAGNOSIS — Z7901 Long term (current) use of anticoagulants: Principal | ICD-10-CM

## 2017-01-20 DIAGNOSIS — I2699 Other pulmonary embolism without acute cor pulmonale: Secondary | ICD-10-CM

## 2017-01-20 LAB — POCT INR: INR: 1.4

## 2017-01-20 MED FILL — WARFARIN SODIUM 5 MG TABLET: 5 | 30 days supply | Qty: 32 | Fill #1

## 2017-01-20 MED FILL — MIRTAZAPINE 15 MG TABLET: 15 | 30 days supply | Qty: 30 | Fill #1

## 2017-01-20 MED FILL — MEGESTROL ACET 40 MG/ML SUS: 40 | 4 days supply | Qty: 80 | Fill #0

## 2017-01-20 MED FILL — ARIPiprazole 5 MG TABS: 5 | 30 days supply | Qty: 30 | Fill #1

## 2017-01-20 NOTE — Telephone Encounter (Signed)
Pt. Came to facility requesting a refill on Tramadol. Please f/u °

## 2017-01-21 ENCOUNTER — Institutional Professional Consult (permissible substitution): Payer: Self-pay | Admitting: Licensed Clinical Social Worker

## 2017-01-24 ENCOUNTER — Other Ambulatory Visit: Payer: Self-pay | Admitting: Internal Medicine

## 2017-01-24 DIAGNOSIS — G894 Chronic pain syndrome: Secondary | ICD-10-CM

## 2017-01-24 MED ORDER — TRAMADOL HCL 50 MG PO TABS: 50.0000 mg | ORAL_TABLET | Freq: Three times a day (TID) | ORAL | 0 refills | Status: DC | PRN

## 2017-01-24 NOTE — Telephone Encounter (Signed)
Patient called checking on status of Tramadol , patient is upset states she has been waiting for it. Please f/up

## 2017-02-02 ENCOUNTER — Ambulatory Visit: Payer: Medicaid Other | Attending: Internal Medicine | Admitting: Internal Medicine

## 2017-02-02 ENCOUNTER — Encounter: Payer: Self-pay | Admitting: Internal Medicine

## 2017-02-02 VITALS — BP 109/64 | HR 60 | Temp 98.3°F | Resp 18 | Ht 63.0 in | Wt 118.0 lb

## 2017-02-02 DIAGNOSIS — F5102 Adjustment insomnia: Secondary | ICD-10-CM | POA: Diagnosis not present

## 2017-02-02 DIAGNOSIS — G894 Chronic pain syndrome: Secondary | ICD-10-CM | POA: Insufficient documentation

## 2017-02-02 DIAGNOSIS — F419 Anxiety disorder, unspecified: Secondary | ICD-10-CM | POA: Diagnosis not present

## 2017-02-02 DIAGNOSIS — J449 Chronic obstructive pulmonary disease, unspecified: Secondary | ICD-10-CM | POA: Diagnosis not present

## 2017-02-02 DIAGNOSIS — Z79899 Other long term (current) drug therapy: Secondary | ICD-10-CM | POA: Diagnosis not present

## 2017-02-02 DIAGNOSIS — Z87442 Personal history of urinary calculi: Secondary | ICD-10-CM | POA: Diagnosis not present

## 2017-02-02 DIAGNOSIS — F329 Major depressive disorder, single episode, unspecified: Secondary | ICD-10-CM | POA: Diagnosis not present

## 2017-02-02 DIAGNOSIS — G4700 Insomnia, unspecified: Secondary | ICD-10-CM | POA: Diagnosis present

## 2017-02-02 DIAGNOSIS — Z7901 Long term (current) use of anticoagulants: Secondary | ICD-10-CM | POA: Insufficient documentation

## 2017-02-02 DIAGNOSIS — R630 Anorexia: Secondary | ICD-10-CM | POA: Diagnosis not present

## 2017-02-02 DIAGNOSIS — Z1231 Encounter for screening mammogram for malignant neoplasm of breast: Secondary | ICD-10-CM | POA: Diagnosis not present

## 2017-02-02 DIAGNOSIS — Z1239 Encounter for other screening for malignant neoplasm of breast: Secondary | ICD-10-CM

## 2017-02-02 DIAGNOSIS — Z86711 Personal history of pulmonary embolism: Secondary | ICD-10-CM | POA: Insufficient documentation

## 2017-02-02 MED ORDER — MEGESTROL ACETATE 40 MG/ML PO SUSP: ORAL | 3 refills | Status: DC

## 2017-02-02 MED ORDER — AMITRIPTYLINE HCL 25 MG PO TABS: 25.0000 mg | ORAL_TABLET | Freq: Every day | ORAL | 3 refills | Status: DC

## 2017-02-02 MED ORDER — TRAMADOL HCL 50 MG PO TABS: 50.0000 mg | ORAL_TABLET | Freq: Three times a day (TID) | ORAL | 0 refills | Status: AC | PRN

## 2017-02-02 NOTE — Progress Notes (Signed)
Emily Cameron, is a 56 y.o. female  NOI:[id number]  QXI:[id number]  DOB - 1961/05/31  Chief Complaint  Patient presents with  . Insomnia      Subjective:   Emily Cameron is a 56 y.o. female withhistory of pulmonary embolism on chronic anticoagulation, COPD, generalized anxiety and depression, remote polysubstance abuse here today for a follow up visit. Patient is complaining of insomnia and loss appetite. She reports adjustment disorder concerning her multiple medical conditions. She continues to have generalized body pain. Patient is extremely non-adherent with medications and follow up. She hardly had therapeutic INR. She has no new complaint today, just wants to get back on her medications and "do things right". Patient has No headache, No chest pain, No abdominal pain - No Nausea, No new weakness tingling or numbness, No Cough - SOB.  Problem  Breast Cancer Screening  Adjustment Insomnia   Qualifier: Diagnosis of  By: Jerilee Hoh MD, Estela     Loss of Appetite   Qualifier: Diagnosis of  By: Jerilee Hoh MD, Estela      ALLERGIES: No Known Allergies  PAST MEDICAL HISTORY: Past Medical History:  Diagnosis Date  . Alcohol abuse   . Anxiety   . COPD (chronic obstructive pulmonary disease) ([place])   . Depression   . History of kidney stones   . Insomnia   . PE (pulmonary embolism) 2006  . Polysubstance abuse    Current smoking and alcohol. 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 Marijuna not taking since 7 month.   . Weight loss    due to anxiety    MEDICATIONS AT HOME: Prior to Admission medications   Medication Sig Start Date End Date Taking? Authorizing Provider  ARIPiprazole (ABILIFY) 5 MG tablet TAKE 1 TABLET BY MOUTH DAILY. NEEDS OFFICE VISIT FOR REFILLS 11/17/16  Yes Roxane Puerto E, MD  megestrol (MEGACE) 40 MG/ML suspension TAKE 10 MLS BY MOUTH 2 TIMES A WEEK. 02/02/17  Yes Sherlonda Flater E, MD  mirtazapine (REMERON) 15 MG tablet Take 1  tablet (15 mg total) by mouth at bedtime. 11/17/16  Yes Zaul Hubers E, MD  potassium chloride (K-DUR) 10 MEQ tablet TAKE 1 TABLET BY MOUTH DAILY. 07/29/16  Yes Tresa Garter, MD  QUEtiapine (SEROQUEL) 400 MG tablet TK 1 T PO QHS 10/31/16  Yes [provider]  Tetrahydrozoline HCl (VISINE OP) Place 2 drops into both eyes as needed (for dry eyes). Reported on 09/09/2015   Yes [provider]  traMADol (ULTRAM) 50 MG tablet Take 1 tablet (50 mg total) by mouth every 8 (eight) hours as needed. 02/07/17 02/22/17 Yes Tresa Garter, MD  warfarin (COUMADIN) 5 MG tablet Take 1 tablet (5 mg total) by mouth daily. Except take 1.5 tablets on Tuesdays. 11/17/16  Yes Tresa Garter, MD  amitriptyline (ELAVIL) 25 MG tablet Take 1 tablet (25 mg total) by mouth at bedtime. 02/02/17   Tresa Garter, MD   Objective:   Vitals:   02/02/17 0951  BP: 109/64  Pulse: 60  Resp: 18  Temp: 98.3 F (36.8 C)  TempSrc: Oral  SpO2: 100%  Weight: 118 lb (53.5 kg)  Height: 5\' 3"  (1.6 m)   Exam General appearance : Awake, alert, not in any distress. Speech Clear. Not toxic looking, thin built HEENT: Atraumatic and Normocephalic, pupils equally reactive to light and accomodation Neck: Supple, no JVD. No cervical lymphadenopathy.  Chest: Good air entry bilaterally, no added sounds  CVS: S1 S2 regular, no  murmurs.  Abdomen: Bowel sounds present, Non tender and not distended with no gaurding, rigidity or rebound. Extremities: B/L Lower Ext shows no edema, both legs are warm to touch Neurology: Awake alert, and oriented X 3, CN II-XII intact, Non focal Skin: No Rash  Data Review  Assessment & Plan   1. Chronic pain syndrome  - traMADol (ULTRAM) 50 MG tablet; Take 1 tablet (50 mg total) by mouth every 8 (eight) hours as needed.  Dispense: 60 tablet; Refill: 0 - amitriptyline (ELAVIL) 25 MG tablet; Take 1 tablet (25 mg total) by mouth at bedtime.  Dispense: 30 tablet; Refill:  3  2. Adjustment insomnia  - amitriptyline (ELAVIL) 25 MG tablet; Take 1 tablet (25 mg total) by mouth at bedtime.  Dispense: 30 tablet; Refill: 3  3. Loss of appetite  - megestrol (MEGACE) 40 MG/ML suspension; TAKE 10 MLS BY MOUTH 2 TIMES A WEEK.  Dispense: 80 mL; Refill: 3  Patient have been counseled extensively about nutrition and exercise. Other issues discussed during this visit include: low cholesterol diet, weight control and daily exercise  Return in about 1 week (around 02/09/2017) for Pap Smear, Annual Physical.  The patient was given clear instructions to go to ER or return to medical center if symptoms don't improve, worsen or new problems develop. The patient verbalized understanding. The patient was told to call to get lab results if they haven't heard anything in the next week.   This note has been created with Surveyor, quantity. Any transcriptional errors are unintentional.    [name], MD, [place], Karilyn Cota, [place] [facility] [place], [place]   02/02/2017, 10:49 AM

## 2017-02-02 NOTE — Patient Instructions (Signed)

## 2017-02-03 ENCOUNTER — Encounter: Payer: Self-pay | Admitting: Pharmacist

## 2017-02-04 ENCOUNTER — Telehealth: Payer: Self-pay | Admitting: Internal Medicine

## 2017-02-04 NOTE — Telephone Encounter (Signed)
[  02/04/2017 2:20 PM] [place], Clifton James:  Hey do you have a prescription for Emily Cameron, she was here yesterday also looking for a letter that Dr Doreene Burke supposed to write for her [02/04/2017 2:24 PM] Wynonia Hazard:  Please call her back at [phone]  Please follow up

## 2017-02-04 NOTE — Telephone Encounter (Signed)
Patient walked out of the office without receiving her paperwork or prescription. Patient may pickup prescription at the front desk. Dr. Doreene Burke is OOO today and will have to address the letter on Monday.

## 2017-02-11 ENCOUNTER — Encounter: Payer: Self-pay | Admitting: *Deleted

## 2017-02-15 ENCOUNTER — Ambulatory Visit: Payer: Self-pay | Admitting: Pharmacist

## 2017-02-17 ENCOUNTER — Ambulatory Visit: Payer: Self-pay | Admitting: Pharmacist

## 2017-02-21 NOTE — Telephone Encounter (Signed)
Patient called requesting mediation refill on traMADol (ULTRAM) 50 MG tablet please f/up

## 2017-02-22 NOTE — Telephone Encounter (Signed)
Pt. Called requesting a refill on Tramadol. Please f/u with pt.  °

## 2017-02-24 ENCOUNTER — Other Ambulatory Visit: Payer: Self-pay | Admitting: Internal Medicine

## 2017-02-24 DIAGNOSIS — G894 Chronic pain syndrome: Secondary | ICD-10-CM

## 2017-02-24 MED ORDER — TRAMADOL HCL 50 MG PO TABS: 50.0000 mg | ORAL_TABLET | Freq: Three times a day (TID) | ORAL | 0 refills | Status: DC | PRN

## 2017-02-24 NOTE — Telephone Encounter (Signed)
Refilled

## 2017-03-08 ENCOUNTER — Other Ambulatory Visit: Payer: Self-pay | Admitting: Internal Medicine

## 2017-03-08 MED FILL — MIRTAZAPINE 15 MG TABLET: 15 | 30 days supply | Qty: 30 | Fill #2

## 2017-03-08 MED FILL — ARIPiprazole 5 MG TABS: 5 | 30 days supply | Qty: 30 | Fill #2

## 2017-03-08 MED FILL — WARFARIN SODIUM 5 MG TABLET: 5 | 30 days supply | Qty: 32 | Fill #2

## 2017-03-08 MED FILL — AMITRIPTYLINE HCL 25 MG TAB: 25 | 30 days supply | Qty: 30 | Fill #0 | Status: TO

## 2017-03-09 MED FILL — MEGESTROL ACET 40 MG/ML SUS: 40 | 4 days supply | Qty: 80 | Fill #0

## 2017-03-22 ENCOUNTER — Encounter (HOSPITAL_COMMUNITY): Payer: Self-pay | Admitting: Emergency Medicine

## 2017-03-22 ENCOUNTER — Emergency Department (HOSPITAL_COMMUNITY)
Admission: EM | Admit: 2017-03-22 | Discharge: 2017-03-22 | Disposition: A | Discharge status: Home / Self Care | Payer: Medicaid Other | Attending: Emergency Medicine | Admitting: Emergency Medicine

## 2017-03-22 ENCOUNTER — Emergency Department (HOSPITAL_COMMUNITY): Payer: Medicaid Other

## 2017-03-22 DIAGNOSIS — R0789 Other chest pain: Secondary | ICD-10-CM | POA: Diagnosis not present

## 2017-03-22 DIAGNOSIS — Z7901 Long term (current) use of anticoagulants: Secondary | ICD-10-CM | POA: Diagnosis not present

## 2017-03-22 DIAGNOSIS — J449 Chronic obstructive pulmonary disease, unspecified: Secondary | ICD-10-CM | POA: Diagnosis not present

## 2017-03-22 DIAGNOSIS — Z79899 Other long term (current) drug therapy: Secondary | ICD-10-CM | POA: Diagnosis not present

## 2017-03-22 DIAGNOSIS — Y939 Activity, unspecified: Secondary | ICD-10-CM | POA: Insufficient documentation

## 2017-03-22 DIAGNOSIS — Y999 Unspecified external cause status: Secondary | ICD-10-CM | POA: Insufficient documentation

## 2017-03-22 DIAGNOSIS — Y92410 Unspecified street and highway as the place of occurrence of the external cause: Secondary | ICD-10-CM | POA: Insufficient documentation

## 2017-03-22 DIAGNOSIS — M25511 Pain in right shoulder: Secondary | ICD-10-CM | POA: Diagnosis not present

## 2017-03-22 DIAGNOSIS — M542 Cervicalgia: Secondary | ICD-10-CM | POA: Diagnosis present
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
## 2017-03-22 MED ORDER — CYCLOBENZAPRINE HCL 10 MG PO TABS: 10.0000 mg | ORAL_TABLET | Freq: Two times a day (BID) | ORAL | 0 refills | Status: DC | PRN
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
## 2017-03-28 ENCOUNTER — Other Ambulatory Visit: Payer: Self-pay | Admitting: Internal Medicine

## 2017-03-28 DIAGNOSIS — G894 Chronic pain syndrome: Secondary | ICD-10-CM

## 2017-03-28 NOTE — Telephone Encounter (Signed)
Pt called to request a refill for traMADol (ULTRAM) 50 MG tablet  Please folow up with pt

## 2017-03-29 ENCOUNTER — Ambulatory Visit: Payer: Self-pay | Admitting: Pharmacist

## 2017-03-29 ENCOUNTER — Ambulatory Visit: Payer: Medicaid Other | Attending: Internal Medicine | Admitting: Pharmacist

## 2017-03-29 DIAGNOSIS — Z7901 Long term (current) use of anticoagulants: Secondary | ICD-10-CM | POA: Diagnosis not present

## 2017-03-29 DIAGNOSIS — Z5181 Encounter for therapeutic drug level monitoring: Secondary | ICD-10-CM | POA: Diagnosis not present

## 2017-03-29 DIAGNOSIS — I2699 Other pulmonary embolism without acute cor pulmonale: Secondary | ICD-10-CM | POA: Insufficient documentation

## 2017-03-29 LAB — POCT INR: INR: 1

## 2017-03-29 MED ORDER — TRAMADOL HCL 50 MG PO TABS: 50.0000 mg | ORAL_TABLET | Freq: Three times a day (TID) | ORAL | 0 refills | Status: DC | PRN

## 2017-03-29 NOTE — Telephone Encounter (Signed)
Left message on voicemail prescription at front desk ready for pick up.

## 2017-03-29 NOTE — Telephone Encounter (Signed)
Refilled

## 2017-04-05 ENCOUNTER — Other Ambulatory Visit: Payer: Self-pay | Admitting: Internal Medicine

## 2017-04-05 MED FILL — MEGESTROL ACET 40 MG/ML SUS: 40 | 28 days supply | Qty: 80 | Fill #0

## 2017-04-13 ENCOUNTER — Ambulatory Visit
Payer: No Typology Code available for payment source | Attending: Internal Medicine | Admitting: Licensed Clinical Social Worker

## 2017-04-13 ENCOUNTER — Ambulatory Visit: Payer: Medicaid Other | Attending: Physician Assistant | Admitting: Physician Assistant

## 2017-04-13 VITALS — BP 118/71 | HR 100 | Temp 98.0°F | Ht 63.0 in | Wt 112.2 lb

## 2017-04-13 DIAGNOSIS — Z86711 Personal history of pulmonary embolism: Secondary | ICD-10-CM | POA: Diagnosis not present

## 2017-04-13 DIAGNOSIS — F419 Anxiety disorder, unspecified: Secondary | ICD-10-CM | POA: Diagnosis not present

## 2017-04-13 DIAGNOSIS — F4321 Adjustment disorder with depressed mood: Secondary | ICD-10-CM | POA: Diagnosis not present

## 2017-04-13 DIAGNOSIS — I2699 Other pulmonary embolism without acute cor pulmonale: Secondary | ICD-10-CM

## 2017-04-13 DIAGNOSIS — J449 Chronic obstructive pulmonary disease, unspecified: Secondary | ICD-10-CM | POA: Diagnosis not present

## 2017-04-13 DIAGNOSIS — R634 Abnormal weight loss: Secondary | ICD-10-CM | POA: Diagnosis not present

## 2017-04-13 DIAGNOSIS — F1910 Other psychoactive substance abuse, uncomplicated: Secondary | ICD-10-CM | POA: Diagnosis not present

## 2017-04-13 DIAGNOSIS — Z79899 Other long term (current) drug therapy: Secondary | ICD-10-CM | POA: Insufficient documentation

## 2017-04-13 DIAGNOSIS — G4700 Insomnia, unspecified: Secondary | ICD-10-CM | POA: Insufficient documentation

## 2017-04-13 DIAGNOSIS — Z87442 Personal history of urinary calculi: Secondary | ICD-10-CM | POA: Insufficient documentation

## 2017-04-13 DIAGNOSIS — Z590 Homelessness: Secondary | ICD-10-CM | POA: Insufficient documentation

## 2017-04-13 DIAGNOSIS — Z7901 Long term (current) use of anticoagulants: Secondary | ICD-10-CM | POA: Insufficient documentation

## 2017-04-13 LAB — POCT INR: INR: 1.6

## 2017-04-13 NOTE — BH Specialist Note (Signed)
Integrated Behavioral Health Initial Visit  MRN: [id number] Name: Emily Cameron   Session Start time: 3:00 PM Session End time: 3:30 PM Total time: 30 minutes  Type of Service: [place] Interpretor:No. Interpretor Name and Language: N/A   Warm Hand Off Completed.       SUBJECTIVE: Emily Cameron is a 56 y.o. female accompanied by patient. Patient was referred by Weyman Pedro for depression and housing. Patient reports the following symptoms/concerns: feelings of sadness and worry, difficulty sleeping, low energy, decreased appetite, and irritability Duration of problem: Ongoing; Severity of problem: moderate  OBJECTIVE: Mood: Anxious and Affect: Depressed Risk of harm to self or others: No plan to harm self or others   LIFE CONTEXT: Family and Social: Pt receives emotional support from family that resides nearby School/Work: Pt receives disability ($750), food stamps ($100), and section eight housing Self-Care: Pt reports substance use (marijuana and alcohol) to cope with stressors. She is open to participating in treatment Life Changes: Pt has been evicted from home due to needing an emotional support animal. She is using substances to cope and is interested in treatment resources  GOALS ADDRESSED: Patient will reduce symptoms of: anxiety and depression and increase knowledge and/or ability of: coping skills and also: Increase adequate support systems for patient/family and Decrease self-medicating behaviors   INTERVENTIONS: Solution-Focused Strategies, Supportive Counseling, Psychoeducation and/or Health Education and Link to [employer]  Standardized Assessments completed: Patient declined screening  ASSESSMENT: Patient currently experiencing depression and anxiety triggered by impending homelessness and ongoing substance use. She reports feelings of sadness and worry, difficulty sleeping, low energy, decreased appetite, and  irritability. Patient may benefit from psychoeducation and psychotherapy. Henry educated pt on how substance use can negatively impact one's physical and mental health. LCSWA discussed benefits of applying healthy coping skills to decrease symptoms. Pt is open to substance use treatment. She is currently receiving psychotherapy once a week. LCSWA provided resources to assist with SA, housing, and food insecurity.   PLAN: 1. Follow up with behavioral health clinician on : Pt was encouraged to contact [facility] if symptoms worsen or fail to improve to schedule behavioral appointments at [facility]. 2. Behavioral recommendations: LCSWA recommends that pt apply healthy coping skills discussed, participate in substance use treatment, and utilize provided resources. Pt is encouraged to schedule follow up appointment with LCSWA 3. Referral(s): Substance Abuse Program and Community Resources:  Food and Housing 4. "From scale of 1-10, how likely are you to follow plan?": 10/10  Rebekah Chesterfield, LCSW 04/15/17 11:14 AM

## 2017-04-13 NOTE — Progress Notes (Signed)
Patient ID: Emily Cameron, female   DOB: Nov 13, 1960, 56 y.o.   MRN: [id number]     Emily Cameron, is a 56 y.o. female  DXA:[id number]  MCN:[id number]  DOB - 04-15-1961  Subjective:  Chief Complaint and HPI: Emily Cameron is a 56 y.o. female here today for INR check.  Also stressed pout due to near homelessness. Says she approached her land lod about getting a therapy pet and was given a 30day eviction notice. Admits to drinking a beer and "smoking a blount" today due to stress.  She has attended AA and NA in the past but not recently.  She denies SI/HI.  She didn't drive herself.  ROS:   Constitutional:  No f/c, No night sweats, No unexplained weight loss. EENT:  No vision changes, No blurry vision, No hearing changes. No mouth, throat, or ear problems.  Respiratory: No cough, No SOB Cardiac: No CP, no palpitations GI:  No abd pain, No N/V/D. GU: No Urinary s/sx Musculoskeletal: No joint pain Neuro: No headache, no dizziness, no motor weakness.  Skin: No rash Endocrine:  No polydipsia. No polyuria.  Psych: Denies SI/HI  No problems updated.  ALLERGIES: No Known Allergies  PAST MEDICAL HISTORY: Past Medical History:  Diagnosis Date  . Alcohol abuse   . Anxiety   . COPD (chronic obstructive pulmonary disease) ([place])   . Depression   . History of kidney stones   . Insomnia   . PE (pulmonary embolism) 2006  . Polysubstance abuse    Current smoking and alcohol. 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 Marijuna not taking since 7 month.   . Weight loss    due to anxiety    MEDICATIONS AT HOME: Prior to Admission medications   Medication Sig Start Date End Date Taking? Authorizing Provider  amitriptyline (ELAVIL) 25 MG tablet Take 1 tablet (25 mg total) by mouth at bedtime. 02/02/17  Yes Tresa Garter, MD  megestrol (MEGACE) 40 MG/ML suspension TAKE 10 MLS BY MOUTH 2 TIMES A WEEK. 04/05/17  Yes Jegede, Olugbemiga E, MD  mirtazapine (REMERON) 15 MG tablet  Take 1 tablet (15 mg total) by mouth at bedtime. 11/17/16  Yes Jegede, Olugbemiga E, MD  potassium chloride (K-DUR) 10 MEQ tablet TAKE 1 TABLET BY MOUTH DAILY. 07/29/16  Yes Tresa Garter, MD  QUEtiapine (SEROQUEL) 400 MG tablet TK 1 T PO QHS 10/31/16  Yes [provider]  Tetrahydrozoline HCl (VISINE OP) Place 2 drops into both eyes as needed (for dry eyes). Reported on 09/09/2015   Yes [provider]  traMADol (ULTRAM) 50 MG tablet Take 1 tablet (50 mg total) by mouth every 8 (eight) hours as needed. 03/29/17  Yes Arnoldo Morale, MD  warfarin (COUMADIN) 5 MG tablet Take 1 tablet (5 mg total) by mouth daily. Except take 1.5 tablets on Tuesdays. 11/17/16  Yes Jegede, Olugbemiga E, MD  ARIPiprazole (ABILIFY) 5 MG tablet TAKE 1 TABLET BY MOUTH DAILY. NEEDS OFFICE VISIT FOR REFILLS Patient not taking: Reported on 04/13/2017 11/17/16   Tresa Garter, MD  cyclobenzaprine (FLEXERIL) 10 MG tablet Take 1 tablet (10 mg total) by mouth 2 (two) times daily as needed for muscle spasms. Patient not taking: Reported on 04/13/2017 03/22/17   Frederica Kuster, PA-C     Objective:  EXAM:   Vitals:   04/13/17 1429  BP: 118/71  Pulse: 100  Temp: 98 F (36.7 C)  TempSrc: Oral  SpO2: 97%  Weight: 112 lb 3.2  oz (50.9 kg)  Height: _0  (1.6 m)    General appearance : A&OX3. NAD. Non-toxic-appearing HEENT: Atraumatic and Normocephalic.  PERRLA. EOM intact.  Neck: supple, no JVD. No cervical lymphadenopathy. No thyromegaly Chest/Lungs:  Breathing-non-labored, Good air entry bilaterally, breath sounds normal without rales, rhonchi, or wheezing  CVS: S1 S2 regular, no murmurs, gallops, rubs  Extremities: Bilateral Lower Ext shows no edema, both legs are warm to touch with = pulse throughout Neurology:  CN II-XII grossly intact, Non focal.   Psych:  TP linear. J/I WNL. Normal speech. Appropriate eye contact and affect.  Skin:  No Rash  Data Review No results found for:  HGBA1C   Assessment & Plan   1. Other pulmonary embolism without acute cor pulmonale, unspecified chronicity (HCC) - INR=1.6 today.  Make sure and take coumadin 46m daily(dont skip doses!)  2. Situational depression Met with JChrista See LCSW  3. Substance abuse I have counseled the patient at length about substance abuse and addiction.  12 step meetings/recovery recommended.  Local 12 step meeting lists were given and attendance was encouraged.  Patient expresses understanding.   Patient have been counseled extensively about nutrition and exercise  Return in about 1 week (around 04/20/2017) for INR/coumadin appt in 1 week.  The patient was given clear instructions to go to ER or return to medical center if symptoms don't improve, worsen or new problems develop. The patient verbalized understanding. The patient was told to call to get lab results if they haven't heard anything in the next week.     AFreeman Caldron PA-C [facility]  04/13/2017, 2:58 PM

## 2017-04-20 ENCOUNTER — Encounter: Payer: Self-pay | Admitting: Pharmacist

## 2017-04-27 ENCOUNTER — Telehealth: Payer: Self-pay | Admitting: Internal Medicine

## 2017-04-27 NOTE — Telephone Encounter (Signed)
Pt. Called requesting a refill on Tramadol. Please f/u with pt.  °

## 2017-05-03 NOTE — Telephone Encounter (Signed)
Pt called back again to check on the statu of the refill for her Tramadol, please follow ujp

## 2017-05-04 NOTE — Telephone Encounter (Signed)
Pt called again about her refill, she want to know why is taking to long for a refill

## 2017-05-05 ENCOUNTER — Other Ambulatory Visit: Payer: Self-pay | Admitting: Internal Medicine

## 2017-05-05 DIAGNOSIS — G894 Chronic pain syndrome: Secondary | ICD-10-CM

## 2017-05-05 MED ORDER — TRAMADOL HCL 50 MG PO TABS: 50.0000 mg | ORAL_TABLET | Freq: Three times a day (TID) | ORAL | 0 refills | Status: DC | PRN

## 2017-05-10 ENCOUNTER — Encounter: Payer: Self-pay | Admitting: Pharmacist

## 2017-05-10 ENCOUNTER — Other Ambulatory Visit: Payer: Self-pay | Admitting: Internal Medicine

## 2017-05-10 MED FILL — traMADol HCL 50 MG TABS: 50 | 20 days supply | Qty: 60 | Fill #0

## 2017-05-24 ENCOUNTER — Telehealth: Payer: Self-pay | Admitting: Internal Medicine

## 2017-05-24 NOTE — Telephone Encounter (Signed)
Please advise on reprinting letter with current date.

## 2017-05-24 NOTE — Telephone Encounter (Signed)
Patient called requesting a new letter for a pet. Please f/up

## 2017-05-30 ENCOUNTER — Encounter: Payer: Self-pay | Admitting: *Deleted

## 2017-05-30 NOTE — Telephone Encounter (Signed)
Patients letter is placed at the front desk.

## 2017-05-30 NOTE — Telephone Encounter (Signed)
Yes please. Thanks. 

## 2017-06-15 ENCOUNTER — Ambulatory Visit: Payer: Medicaid Other | Attending: Internal Medicine | Admitting: Physician Assistant

## 2017-06-15 ENCOUNTER — Encounter: Payer: Self-pay | Admitting: Physician Assistant

## 2017-06-15 VITALS — BP 130/77 | HR 71 | Temp 97.7°F | Resp 18 | Ht 63.0 in | Wt 125.2 lb

## 2017-06-15 DIAGNOSIS — F329 Major depressive disorder, single episode, unspecified: Secondary | ICD-10-CM | POA: Diagnosis not present

## 2017-06-15 DIAGNOSIS — I82509 Chronic embolism and thrombosis of unspecified deep veins of unspecified lower extremity: Secondary | ICD-10-CM

## 2017-06-15 DIAGNOSIS — J449 Chronic obstructive pulmonary disease, unspecified: Secondary | ICD-10-CM | POA: Insufficient documentation

## 2017-06-15 DIAGNOSIS — Z7901 Long term (current) use of anticoagulants: Secondary | ICD-10-CM | POA: Insufficient documentation

## 2017-06-15 DIAGNOSIS — F5102 Adjustment insomnia: Secondary | ICD-10-CM | POA: Diagnosis not present

## 2017-06-15 DIAGNOSIS — F419 Anxiety disorder, unspecified: Secondary | ICD-10-CM | POA: Insufficient documentation

## 2017-06-15 DIAGNOSIS — F17200 Nicotine dependence, unspecified, uncomplicated: Secondary | ICD-10-CM | POA: Insufficient documentation

## 2017-06-15 DIAGNOSIS — Z86711 Personal history of pulmonary embolism: Secondary | ICD-10-CM | POA: Insufficient documentation

## 2017-06-15 DIAGNOSIS — G894 Chronic pain syndrome: Secondary | ICD-10-CM | POA: Diagnosis not present

## 2017-06-15 DIAGNOSIS — Z87442 Personal history of urinary calculi: Secondary | ICD-10-CM | POA: Diagnosis not present

## 2017-06-15 DIAGNOSIS — Z79899 Other long term (current) drug therapy: Secondary | ICD-10-CM | POA: Insufficient documentation

## 2017-06-15 DIAGNOSIS — I2699 Other pulmonary embolism without acute cor pulmonale: Secondary | ICD-10-CM

## 2017-06-15 LAB — POCT INR: INR: 4.6

## 2017-06-15 MED ORDER — WARFARIN SODIUM 5 MG PO TABS: 5.0000 mg | ORAL_TABLET | Freq: Every day | ORAL | 3 refills | Status: DC

## 2017-06-15 MED ORDER — AMITRIPTYLINE HCL 25 MG PO TABS: 25.0000 mg | ORAL_TABLET | Freq: Every day | ORAL | 3 refills | Status: DC

## 2017-06-15 MED ORDER — MIRTAZAPINE 15 MG PO TABS: 15.0000 mg | ORAL_TABLET | Freq: Every day | ORAL | 3 refills | Status: DC

## 2017-06-15 MED ORDER — TRAMADOL HCL 50 MG PO TABS: 50.0000 mg | ORAL_TABLET | Freq: Three times a day (TID) | ORAL | 0 refills | Status: DC | PRN

## 2017-06-15 MED ORDER — QUETIAPINE FUMARATE 50 MG PO TABS: 50.0000 mg | ORAL_TABLET | Freq: Every day | ORAL | 2 refills | Status: DC

## 2017-06-15 MED ORDER — MEGESTROL ACETATE 40 MG/ML PO SUSP: ORAL | 0 refills | Status: DC

## 2017-06-15 MED ORDER — POTASSIUM CHLORIDE ER 10 MEQ PO TBCR: 10.0000 meq | EXTENDED_RELEASE_TABLET | Freq: Every day | ORAL | 3 refills | Status: DC

## 2017-06-15 NOTE — Telephone Encounter (Signed)
CMA call regarding medication RX tramadol is ready for pick up at front desk   Patient did not answer CMA left a detailed message & if have any questions just to call back

## 2017-06-15 NOTE — Progress Notes (Signed)
Emily Cameron, is a 56 y.o. female  ZOX:[id number]  [id number]  DOB - 1961/07/18  Subjective:  Chief Complaint and HPI: Emily Cameron is a 56 y.o. female here today for RF on medications and bc she needs a new letter describing why she needs a therapy animal.  She is taking coumadin 5mg  daily but hasn't taken any today.  No problems or complaints.  She expresses frustration that she is not seeing Dr Doreene Burke today.    ROS:   Constitutional:  No f/c, No night sweats, No unexplained weight loss. EENT:  No vision changes, No blurry vision, No hearing changes. No mouth, throat, or ear problems.  Respiratory: No cough, No SOB Cardiac: No CP, no palpitations GI:  No abd pain, No N/V/D. GU: No Urinary s/sx Musculoskeletal: No joint pain Neuro: No headache, no dizziness, no motor weakness.  Skin: No rash Endocrine:  No polydipsia. No polyuria.  Psych: Denies SI/HI  No problems updated.  ALLERGIES: No Known Allergies  PAST MEDICAL HISTORY: Past Medical History:  Diagnosis Date  . Alcohol abuse   . Anxiety   . COPD (chronic obstructive pulmonary disease) ([place])   . Depression   . History of kidney stones   . Insomnia   . PE (pulmonary embolism) 2006  . Polysubstance abuse ([place])    Current smoking and alcohol. 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 Marijuna not taking since 7 month.   . Weight loss    due to anxiety    MEDICATIONS AT HOME: Prior to Admission medications   Medication Sig Start Date End Date Taking? Authorizing Provider  amitriptyline (ELAVIL) 25 MG tablet Take 1 tablet (25 mg total) by mouth at bedtime. 06/15/17   Argentina Donovan, PA-C  megestrol (MEGACE) 40 MG/ML suspension TAKE 10 MLS (2 TEASPOONSFUL) BY MOUTH 2 TIMES A WEEK. 06/15/17   Freeman Caldron M, PA-C  mirtazapine (REMERON) 15 MG tablet Take 1 tablet (15 mg total) by mouth at bedtime. 06/15/17   Argentina Donovan, PA-C  potassium chloride (K-DUR) 10 MEQ tablet Take 1 tablet (10 mEq  total) by mouth daily. 06/15/17   Argentina Donovan, PA-C  QUEtiapine (SEROQUEL) 50 MG tablet Take 1 tablet (50 mg total) by mouth at bedtime. 06/15/17   Argentina Donovan, PA-C  Tetrahydrozoline HCl (VISINE OP) Place 2 drops into both eyes as needed (for dry eyes). Reported on 09/09/2015    [provider]  traMADol (ULTRAM) 50 MG tablet Take 1 tablet (50 mg total) by mouth every 8 (eight) hours as needed. 06/15/17   Argentina Donovan, PA-C  warfarin (COUMADIN) 5 MG tablet Take 1 tablet (5 mg total) by mouth daily. Except take 1.5 tablets on Tuesdays. 06/15/17   Argentina Donovan, PA-C     Objective:  EXAM:   Vitals:   06/15/17 1129  BP: 130/77  Pulse: 71  Resp: 18  Temp: 97.7 F (36.5 C)  TempSrc: Oral  SpO2: 100%  Weight: 125 lb 3.2 oz (56.8 kg)  Height: 5\' 3"  (1.6 m)    General appearance : A&OX3. NAD. Non-toxic-appearing HEENT: Atraumatic and Normocephalic.  PERRLA. EOM intact.   Neck: supple, no JVD. No cervical lymphadenopathy. No thyromegaly Chest/Lungs:  Breathing-non-labored, Good air entry bilaterally, breath sounds normal without rales, rhonchi, or wheezing  CVS: S1 S2 regular, no murmurs, gallops, rubs  Extremities: Bilateral Lower Ext shows no edema, both legs are warm to touch with = pulse throughout Neurology:  CN II-XII grossly  intact, Non focal.   Psych:  TP linear. J/I WNL. Normal speech. Appropriate eye contact and affect.  Skin:  No Rash  Data Review No results found for: HGBA1C   Assessment & Plan   1. Chronic deep vein thrombosis (DVT) of lower extremity, unspecified laterality, unspecified vein (HCC) Hold coumadin today and tomorrow and see Theda Sers in 48 hours. - INR - warfarin (COUMADIN) 5 MG tablet; Take 1 tablet (5 mg total) by mouth daily. Except take 1.5 tablets on Tuesdays.  Dispense: 32 tablet; Refill: 3-new dosing tbd.   3. Chronic pain syndrome - amitriptyline (ELAVIL) 25 MG tablet; Take 1 tablet (25 mg total) by mouth at  bedtime.  Dispense: 30 tablet; Refill: 3 - traMADol (ULTRAM) 50 MG tablet; Take 1 tablet (50 mg total) by mouth every 8 (eight) hours as needed.  Dispense: 60 tablet; Refill: 0  4. Adjustment insomnia - amitriptyline (ELAVIL) 25 MG tablet; Take 1 tablet (25 mg total) by mouth at bedtime.  Dispense: 30 tablet; Refill: 3 Restarted seroquel but at only 50mg  at bedtime   Patient have been counseled extensively about nutrition and exercise  Return in about 2 days (around 06/17/2017) for appt with [facility] for INR; f/up Dr Doreene Burke in 6 weeks.  The patient was given clear instructions to go to ER or return to medical center if symptoms don't improve, worsen or new problems develop. The patient verbalized understanding. The patient was told to call to get lab results if they haven't heard anything in the next week.     Freeman Caldron, PA-C [facility] and [place], [place]   06/15/2017, 1:06 PMPatient ID: Emily Cameron, female   DOB: 1960-11-25, 56 y.o.   MRN: [id number]

## 2017-06-17 ENCOUNTER — Other Ambulatory Visit: Payer: Self-pay | Admitting: Internal Medicine

## 2017-06-17 ENCOUNTER — Other Ambulatory Visit: Payer: Self-pay | Admitting: Pharmacist

## 2017-06-17 ENCOUNTER — Encounter: Payer: Self-pay | Admitting: Pharmacist

## 2017-06-17 DIAGNOSIS — F5102 Adjustment insomnia: Secondary | ICD-10-CM

## 2017-06-17 DIAGNOSIS — I2699 Other pulmonary embolism without acute cor pulmonale: Secondary | ICD-10-CM

## 2017-06-17 DIAGNOSIS — G894 Chronic pain syndrome: Secondary | ICD-10-CM

## 2017-06-17 MED ORDER — QUETIAPINE FUMARATE 50 MG PO TABS: 50.0000 mg | ORAL_TABLET | Freq: Every day | ORAL | 2 refills | Status: DC

## 2017-06-21 ENCOUNTER — Telehealth: Payer: Self-pay | Admitting: Internal Medicine

## 2017-06-21 NOTE — Telephone Encounter (Signed)
Patient called to request Support Animal please follow up  phone [phone]

## 2017-06-22 ENCOUNTER — Other Ambulatory Visit: Payer: Self-pay | Admitting: Internal Medicine

## 2017-06-22 DIAGNOSIS — I2699 Other pulmonary embolism without acute cor pulmonale: Secondary | ICD-10-CM

## 2017-06-22 NOTE — Telephone Encounter (Signed)
Please advise on drafting a letter for support animal

## 2017-06-24 ENCOUNTER — Encounter: Payer: Self-pay | Admitting: Pharmacist

## 2017-06-24 MED FILL — traMADol HCL 50 MG TABS: 50 | 20 days supply | Qty: 60 | Fill #0

## 2017-06-24 NOTE — Progress Notes (Signed)
Patient told front desk that [facility] will not fill her tramadol due to the quantity.  I reviewed the script and the Controlled Substances Registry - no issues noted.  Patient does have Medicaid, which we were unaware of and needs a prior authorization for tramadol. Completed and approved x 180 days. Prior Approval #: [id number]  Patient will try to fill at [facility] pharmacy

## 2017-07-11 NOTE — Telephone Encounter (Signed)
Could you please ask patient the specific animal she has in mind and what kind of support does she need. Thanks

## 2017-07-22 ENCOUNTER — Other Ambulatory Visit: Payer: Self-pay | Admitting: Internal Medicine

## 2017-07-26 ENCOUNTER — Telehealth: Payer: Self-pay | Admitting: Internal Medicine

## 2017-07-26 NOTE — Telephone Encounter (Signed)
Patient called asking for a refill on Tramadol. Please fu she would like all medications from now on to be sent to [place]. Please fu

## 2017-07-28 ENCOUNTER — Other Ambulatory Visit: Payer: Self-pay | Admitting: Internal Medicine

## 2017-07-28 DIAGNOSIS — G894 Chronic pain syndrome: Secondary | ICD-10-CM

## 2017-07-28 MED ORDER — TRAMADOL HCL 50 MG PO TABS: 50.0000 mg | ORAL_TABLET | Freq: Three times a day (TID) | ORAL | 0 refills | Status: DC | PRN

## 2017-07-28 NOTE — Telephone Encounter (Signed)
Sent!

## 2017-08-24 ENCOUNTER — Other Ambulatory Visit: Payer: Self-pay | Admitting: Internal Medicine

## 2017-08-24 DIAGNOSIS — I2699 Other pulmonary embolism without acute cor pulmonale: Secondary | ICD-10-CM

## 2017-08-26 ENCOUNTER — Telehealth: Payer: Self-pay | Admitting: Internal Medicine

## 2017-08-26 DIAGNOSIS — G894 Chronic pain syndrome: Secondary | ICD-10-CM

## 2017-08-26 NOTE — Telephone Encounter (Signed)
I cannot refill this. Will forward to Dr. Margarita Rana who is covering for Dr. Doreene Burke

## 2017-08-26 NOTE — Telephone Encounter (Signed)
Pt called to request a refill on  -traMADol (ULTRAM) 50 MG tablet  If approved please send to [facility] on [address] Please follow up

## 2017-08-29 ENCOUNTER — Telehealth: Payer: Self-pay

## 2017-08-29 ENCOUNTER — Telehealth: Payer: Self-pay | Admitting: Internal Medicine

## 2017-08-29 MED ORDER — TRAMADOL HCL 50 MG PO TABS: 50.0000 mg | ORAL_TABLET | Freq: Three times a day (TID) | ORAL | 0 refills | Status: DC | PRN

## 2017-08-29 NOTE — Telephone Encounter (Signed)
Addressed.

## 2017-08-29 NOTE — Telephone Encounter (Signed)
Ready for pick up

## 2017-08-29 NOTE — Addendum Note (Signed)
Addended by: Arnoldo Morale on: 08/29/2017 01:18 PM   Modules accepted: Orders

## 2017-08-29 NOTE — Telephone Encounter (Signed)
Pt was called and informed of script being ready for pick up.

## 2017-08-29 NOTE — Telephone Encounter (Signed)
Pt. Called upset stating that she has been calling since last week requesting a refill on Tramadol. Pt. Would like Rx sent to [facility] on [address]. Please f/u

## 2017-08-30 ENCOUNTER — Other Ambulatory Visit: Payer: Self-pay | Admitting: Internal Medicine

## 2017-08-30 ENCOUNTER — Encounter: Payer: Self-pay | Admitting: Pharmacist

## 2017-08-30 DIAGNOSIS — F5102 Adjustment insomnia: Secondary | ICD-10-CM

## 2017-08-30 DIAGNOSIS — G894 Chronic pain syndrome: Secondary | ICD-10-CM

## 2017-09-05 ENCOUNTER — Telehealth: Payer: Self-pay | Admitting: Internal Medicine

## 2017-09-05 NOTE — Telephone Encounter (Signed)
Allen hunt from [employer] called, returning a call. Please follow up

## 2017-09-22 ENCOUNTER — Ambulatory Visit: Payer: Medicaid Other | Attending: Internal Medicine | Admitting: Physician Assistant

## 2017-09-22 ENCOUNTER — Ambulatory Visit (HOSPITAL_BASED_OUTPATIENT_CLINIC_OR_DEPARTMENT_OTHER): Payer: Medicaid Other | Admitting: Pharmacist

## 2017-09-22 ENCOUNTER — Other Ambulatory Visit: Payer: Self-pay | Admitting: Internal Medicine

## 2017-09-22 DIAGNOSIS — J449 Chronic obstructive pulmonary disease, unspecified: Secondary | ICD-10-CM | POA: Diagnosis not present

## 2017-09-22 DIAGNOSIS — Z79899 Other long term (current) drug therapy: Secondary | ICD-10-CM | POA: Diagnosis not present

## 2017-09-22 DIAGNOSIS — M25511 Pain in right shoulder: Secondary | ICD-10-CM | POA: Insufficient documentation

## 2017-09-22 DIAGNOSIS — G894 Chronic pain syndrome: Secondary | ICD-10-CM | POA: Insufficient documentation

## 2017-09-22 DIAGNOSIS — F17210 Nicotine dependence, cigarettes, uncomplicated: Secondary | ICD-10-CM | POA: Diagnosis not present

## 2017-09-22 DIAGNOSIS — Z7901 Long term (current) use of anticoagulants: Secondary | ICD-10-CM | POA: Diagnosis not present

## 2017-09-22 DIAGNOSIS — Z5181 Encounter for therapeutic drug level monitoring: Secondary | ICD-10-CM

## 2017-09-22 DIAGNOSIS — I2699 Other pulmonary embolism without acute cor pulmonale: Secondary | ICD-10-CM

## 2017-09-22 DIAGNOSIS — F5102 Adjustment insomnia: Secondary | ICD-10-CM

## 2017-09-22 LAB — POCT INR: INR: 1.3

## 2017-09-22 MED ORDER — TRAMADOL HCL 50 MG PO TABS: 50.0000 mg | ORAL_TABLET | Freq: Three times a day (TID) | ORAL | 0 refills | Status: DC | PRN

## 2017-09-22 NOTE — Progress Notes (Signed)
Patient ID: Emily Cameron, female   DOB: 1961-06-08, 57 y.o.   MRN: [id number]   Emily Cameron, is a 57 y.o. female  ZDG:[id number]  [id number]  DOB - June 28, 1961  Subjective:  Chief Complaint and HPI: Emily Cameron is a 57 y.o. female here today for pain in R shoulder that she has had for years.  She says ortho told her she needed surgery.  Requesting RF of tramadol.  Last filled 08/29/2017.  Also has pain in L side of face after biting down on something hard on her L jaw.  No f/c.    ROS:   Constitutional:  No f/c, No night sweats, No unexplained weight loss. EENT:  No vision changes, No blurry vision, No hearing changes. No mouth, throat, or ear problems.  Respiratory: No cough, No SOB Cardiac: No CP, no palpitations GI:  No abd pain, No N/V/D. GU: No Urinary s/sx Musculoskeletal: R shoulder pain, L jaw pain Neuro: No headache, no dizziness, no motor weakness.  Skin: No rash Endocrine:  No polydipsia. No polyuria.  Psych: Denies SI/HI  No problems updated.  ALLERGIES: No Known Allergies  PAST MEDICAL HISTORY: Past Medical History:  Diagnosis Date  . Alcohol abuse   . Anxiety   . COPD (chronic obstructive pulmonary disease) ([place])   . Depression   . History of kidney stones   . Insomnia   . PE (pulmonary embolism) 2006  . Polysubstance abuse ([place])    Current smoking and alcohol. 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 Marijuna not taking since 7 month.   . Weight loss    due to anxiety    MEDICATIONS AT HOME: Prior to Admission medications   Medication Sig Start Date End Date Taking? Authorizing Provider  amitriptyline (ELAVIL) 25 MG tablet TAKE ONE TABLET BY MOUTH EVERY DAY at bedtime 08/30/17  Yes Jegede, Olugbemiga E, MD  megestrol (MEGACE) 40 MG/ML suspension TAKE 10 MLS (2 TEASPOONSFUL) BY MOUTH 2 TIMES A WEEK. 06/15/17  Yes Trisha Ken M, PA-C  mirtazapine (REMERON) 15 MG tablet TAKE ONE TABLET BY MOUTH AT BEDTIME 07/22/17  Yes Jegede, Olugbemiga  E, MD  potassium chloride (K-DUR) 10 MEQ tablet Take 1 tablet (10 mEq total) by mouth daily. 06/15/17  Yes Heron Pitcock M, PA-C  QUEtiapine (SEROQUEL) 50 MG tablet Take 1 tablet (50 mg total) by mouth at bedtime. 06/17/17  Yes Tresa Garter, MD  traMADol (ULTRAM) 50 MG tablet Take 1 tablet (50 mg total) by mouth every 8 (eight) hours as needed. 09/22/17  Yes Argentina Donovan, PA-C  warfarin (COUMADIN) 5 MG tablet TAKE ONE TABLET BY MOUTH EVERY DAY EXCEPT ON tuesdays take ONE AND ONE-HALF tablets 08/24/17  Yes Jegede, Olugbemiga E, MD  ARIPiprazole (ABILIFY) 5 MG tablet TAKE ONE TABLET BY MOUTH EVERY DAY *needs office visit FOR furhter refills* Patient not taking: Reported on 09/22/2017 07/22/17   Tresa Garter, MD  Tetrahydrozoline HCl (VISINE OP) Place 2 drops into both eyes as needed (for dry eyes). Reported on 09/09/2015    [provider]     Objective:  EXAM:   Vitals:   09/22/17 1411  BP: 137/69  Pulse: 90  Resp: 16  Temp: 98.2 F (36.8 C)  TempSrc: Oral  SpO2: 97%  Weight: 135 lb 9.6 oz (61.5 kg)  Height: 5\' 3"  (1.6 m)    General appearance : A&OX3. NAD. Non-toxic-appearing HEENT: Atraumatic and Normocephalic.  PERRLA. EOM intact.  TM clear B. Mouth-MMM, post  pharynx WNL w/o erythema, No PND.  Poor dentition.  No sign of abscess or TMJ Neck: supple, no JVD. No cervical lymphadenopathy. No thyromegaly Chest/Lungs:  Breathing-non-labored, Good air entry bilaterally, breath sounds normal without rales, rhonchi, or wheezing  CVS: S1 S2 regular, no murmurs, gallops, rubs  R shoulder full S&ROM- joint itself is stable w/o laxity Extremities: Bilateral Lower Ext shows no edema, both legs are warm to touch with = pulse throughout Neurology:  CN II-XII grossly intact, Non focal.   Psych:  TP linear. J/I WNL. Normal speech. Appropriate eye contact and affect.  Skin:  No Rash  Data Review No results found for: HGBA1C   Assessment & Plan   1. Chronic pain  syndrome R shoulder pain;  Ongoing-no new abnormality.  L jaw pain w/o gross abnormality.  I consulted with Dr Doreene Burke and he was ok with me RF her tramadol at this time.  Counseled patient to avoid alcohol(also bc she is on coumadin) - traMADol (ULTRAM) 50 MG tablet; Take 1 tablet (50 mg total) by mouth every 8 (eight) hours as needed.  Dispense: 60 tablet; Refill: 0   Patient have been counseled extensively about nutrition and exercise  Return if symptoms worsen or fail to improve.  The patient was given clear instructions to go to ER or return to medical center if symptoms don't improve, worsen or new problems develop. The patient verbalized understanding. The patient was told to call to get lab results if they haven't heard anything in the next week.     Freeman Caldron, PA-C [facility] and [place], [place]   09/22/2017, 2:45 PM

## 2017-09-22 NOTE — Progress Notes (Signed)
Patient is here for her shoulder pain, tonsil feels swollen, and left leg bruise.   Patient stated her pain for her shoulder started 3 weeks ago.

## 2017-09-23 ENCOUNTER — Other Ambulatory Visit: Payer: Self-pay | Admitting: Internal Medicine

## 2017-09-23 DIAGNOSIS — F5102 Adjustment insomnia: Secondary | ICD-10-CM

## 2017-09-23 DIAGNOSIS — I2699 Other pulmonary embolism without acute cor pulmonale: Secondary | ICD-10-CM

## 2017-09-23 DIAGNOSIS — G894 Chronic pain syndrome: Secondary | ICD-10-CM

## 2017-09-29 ENCOUNTER — Encounter: Payer: Self-pay | Admitting: Pharmacist

## 2017-10-04 ENCOUNTER — Encounter: Payer: Self-pay | Admitting: Pharmacist

## 2017-10-17 ENCOUNTER — Encounter (HOSPITAL_COMMUNITY): Payer: Self-pay | Admitting: Emergency Medicine

## 2017-10-17 ENCOUNTER — Emergency Department (HOSPITAL_COMMUNITY): Payer: Medicaid Other

## 2017-10-17 ENCOUNTER — Emergency Department (HOSPITAL_COMMUNITY)
Admission: EM | Admit: 2017-10-17 | Discharge: 2017-10-17 | Disposition: A | Discharge status: Home / Self Care | Payer: Medicaid Other | Attending: Emergency Medicine | Admitting: Emergency Medicine

## 2017-10-17 DIAGNOSIS — F331 Major depressive disorder, recurrent, moderate: Secondary | ICD-10-CM | POA: Insufficient documentation

## 2017-10-17 DIAGNOSIS — Z79899 Other long term (current) drug therapy: Secondary | ICD-10-CM | POA: Insufficient documentation

## 2017-10-17 DIAGNOSIS — Z7901 Long term (current) use of anticoagulants: Secondary | ICD-10-CM | POA: Diagnosis not present

## 2017-10-17 DIAGNOSIS — F17210 Nicotine dependence, cigarettes, uncomplicated: Secondary | ICD-10-CM | POA: Insufficient documentation

## 2017-10-17 DIAGNOSIS — F419 Anxiety disorder, unspecified: Secondary | ICD-10-CM | POA: Diagnosis not present

## 2017-10-17 DIAGNOSIS — J449 Chronic obstructive pulmonary disease, unspecified: Secondary | ICD-10-CM | POA: Diagnosis not present

## 2017-10-17 DIAGNOSIS — R0602 Shortness of breath: Secondary | ICD-10-CM | POA: Diagnosis present

## 2017-10-17 DIAGNOSIS — R45851 Suicidal ideations: Secondary | ICD-10-CM | POA: Insufficient documentation

## 2017-10-17 DIAGNOSIS — F329 Major depressive disorder, single episode, unspecified: Secondary | ICD-10-CM

## 2017-10-17 LAB — COMPREHENSIVE METABOLIC PANEL
ALBUMIN: 3.4 g/dL — AB (ref 3.5–5.0)
ALK PHOS: 69 U/L (ref 38–126)
ALT: 11 U/L — ABNORMAL LOW (ref 14–54)
ANION GAP: 10 (ref 5–15)
AST: 19 U/L (ref 15–41)
BUN: <5 mg/dL — ABNORMAL LOW (ref 6–20)
CALCIUM: 9 mg/dL (ref 8.9–10.3)
CHLORIDE: 107 mmol/L (ref 101–111)
CO2: 24 mmol/L (ref 22–32)
Creatinine, Ser: 0.78 mg/dL (ref 0.44–1.00)
GFR calc non Af Amer: >60 mL/min (ref >60)
GLUCOSE: 77 mg/dL (ref 65–99)
POTASSIUM: 3.2 mmol/L — AB (ref 3.5–5.1)
Sodium: 141 mmol/L (ref 135–145)
Total Bilirubin: 0.5 mg/dL (ref 0.3–1.2)
Total Protein: 7.3 g/dL (ref 6.5–8.1)

## 2017-10-17 LAB — BRAIN NATRIURETIC PEPTIDE: B NATRIURETIC PEPTIDE 5: 33.9 pg/mL (ref 0.0–100.0)

## 2017-10-17 LAB — CBC WITH DIFFERENTIAL/PLATELET
Basophils Absolute: 0 10*3/uL (ref 0.0–0.1)
Basophils Relative: 0 %
EOS ABS: 0.1 10*3/uL (ref 0.0–0.7)
EOS PCT: 1 %
HCT: 35.8 % — ABNORMAL LOW (ref 36.0–46.0)
Hemoglobin: 12.5 g/dL (ref 12.0–15.0)
LYMPHS ABS: 3.5 10*3/uL (ref 0.7–4.0)
Lymphocytes Relative: 40 %
MCH: 31.2 pg (ref 26.0–34.0)
MCHC: 34.9 g/dL (ref 30.0–36.0)
MCV: 89.3 fL (ref 78.0–100.0)
MONOS PCT: 7 %
Monocytes Absolute: 0.6 10*3/uL (ref 0.1–1.0)
Neutro Abs: 4.4 10*3/uL (ref 1.7–7.7)
Neutrophils Relative %: 52 %
PLATELETS: 215 10*3/uL (ref 150–400)
RBC: 4.01 MIL/uL (ref 3.87–5.11)
RDW: 13.7 % (ref 11.5–15.5)
WBC: 8.6 10*3/uL (ref 4.0–10.5)

## 2017-10-17 LAB — SALICYLATE LEVEL: Salicylate Lvl: <7 mg/dL (ref 2.8–30.0)

## 2017-10-17 LAB — I-STAT TROPONIN, ED: Troponin i, poc: 0 ng/mL (ref 0.00–0.08)

## 2017-10-17 LAB — ETHANOL

## 2017-10-17 LAB — PROTIME-INR
INR: 1.03
PROTHROMBIN TIME: 13.4 s (ref 11.4–15.2)

## 2017-10-17 LAB — ACETAMINOPHEN LEVEL

## 2017-10-17 IMAGING — DX DG CHEST 2V
2 series · 2 of 2 positions shown · non-contrast
Comparison: PA and lateral chest x-ray March 22, 2017

CLINICAL DATA: Shortness of breath, exertional right flank pain,
fatigue, and dizziness for the past 3 days. History of previous
pulmonary embolism. Discontinued Coumadin 1 week ago. History of
COPD, current smoker.

EXAM:
CHEST  2 VIEW

[chest pa]
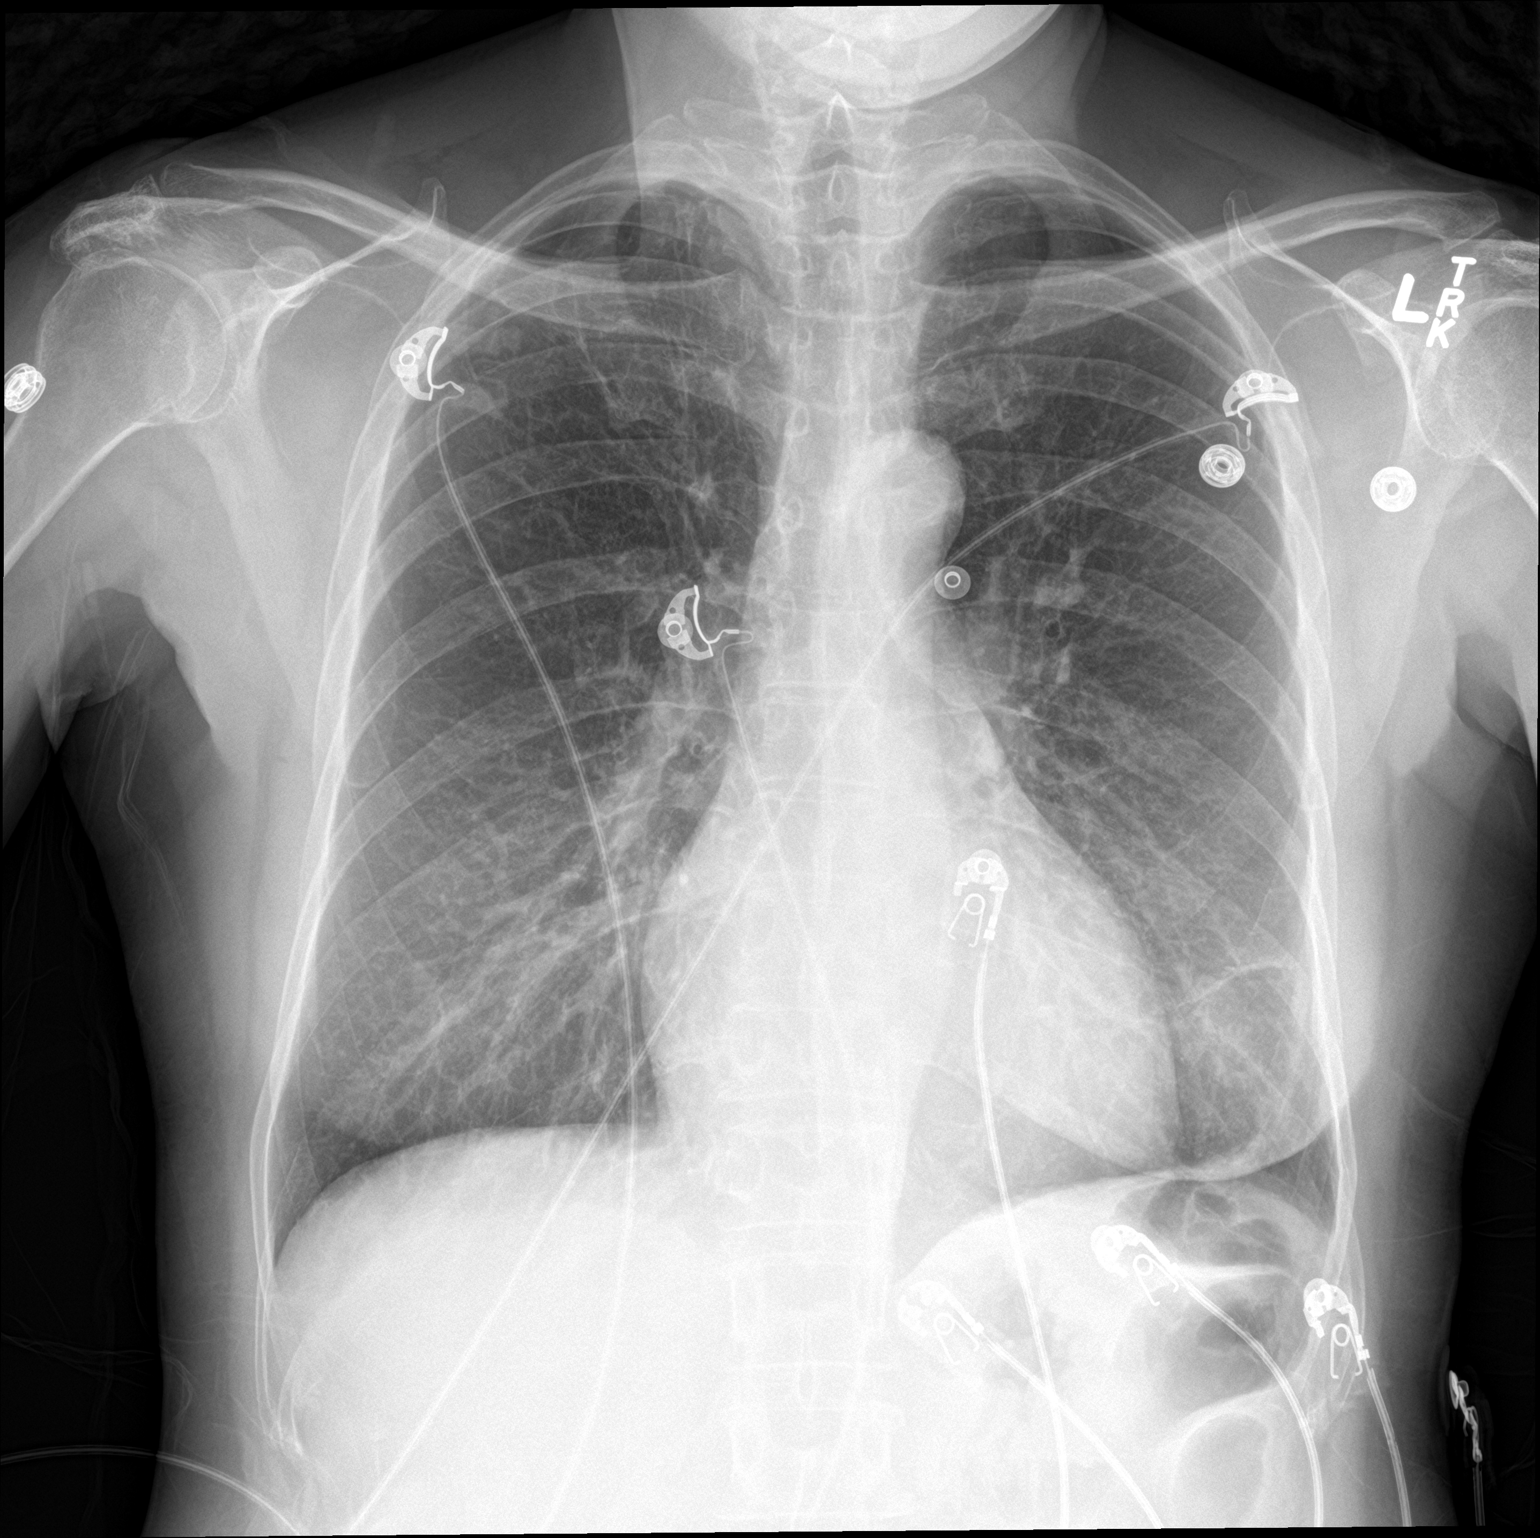

[chest lat]
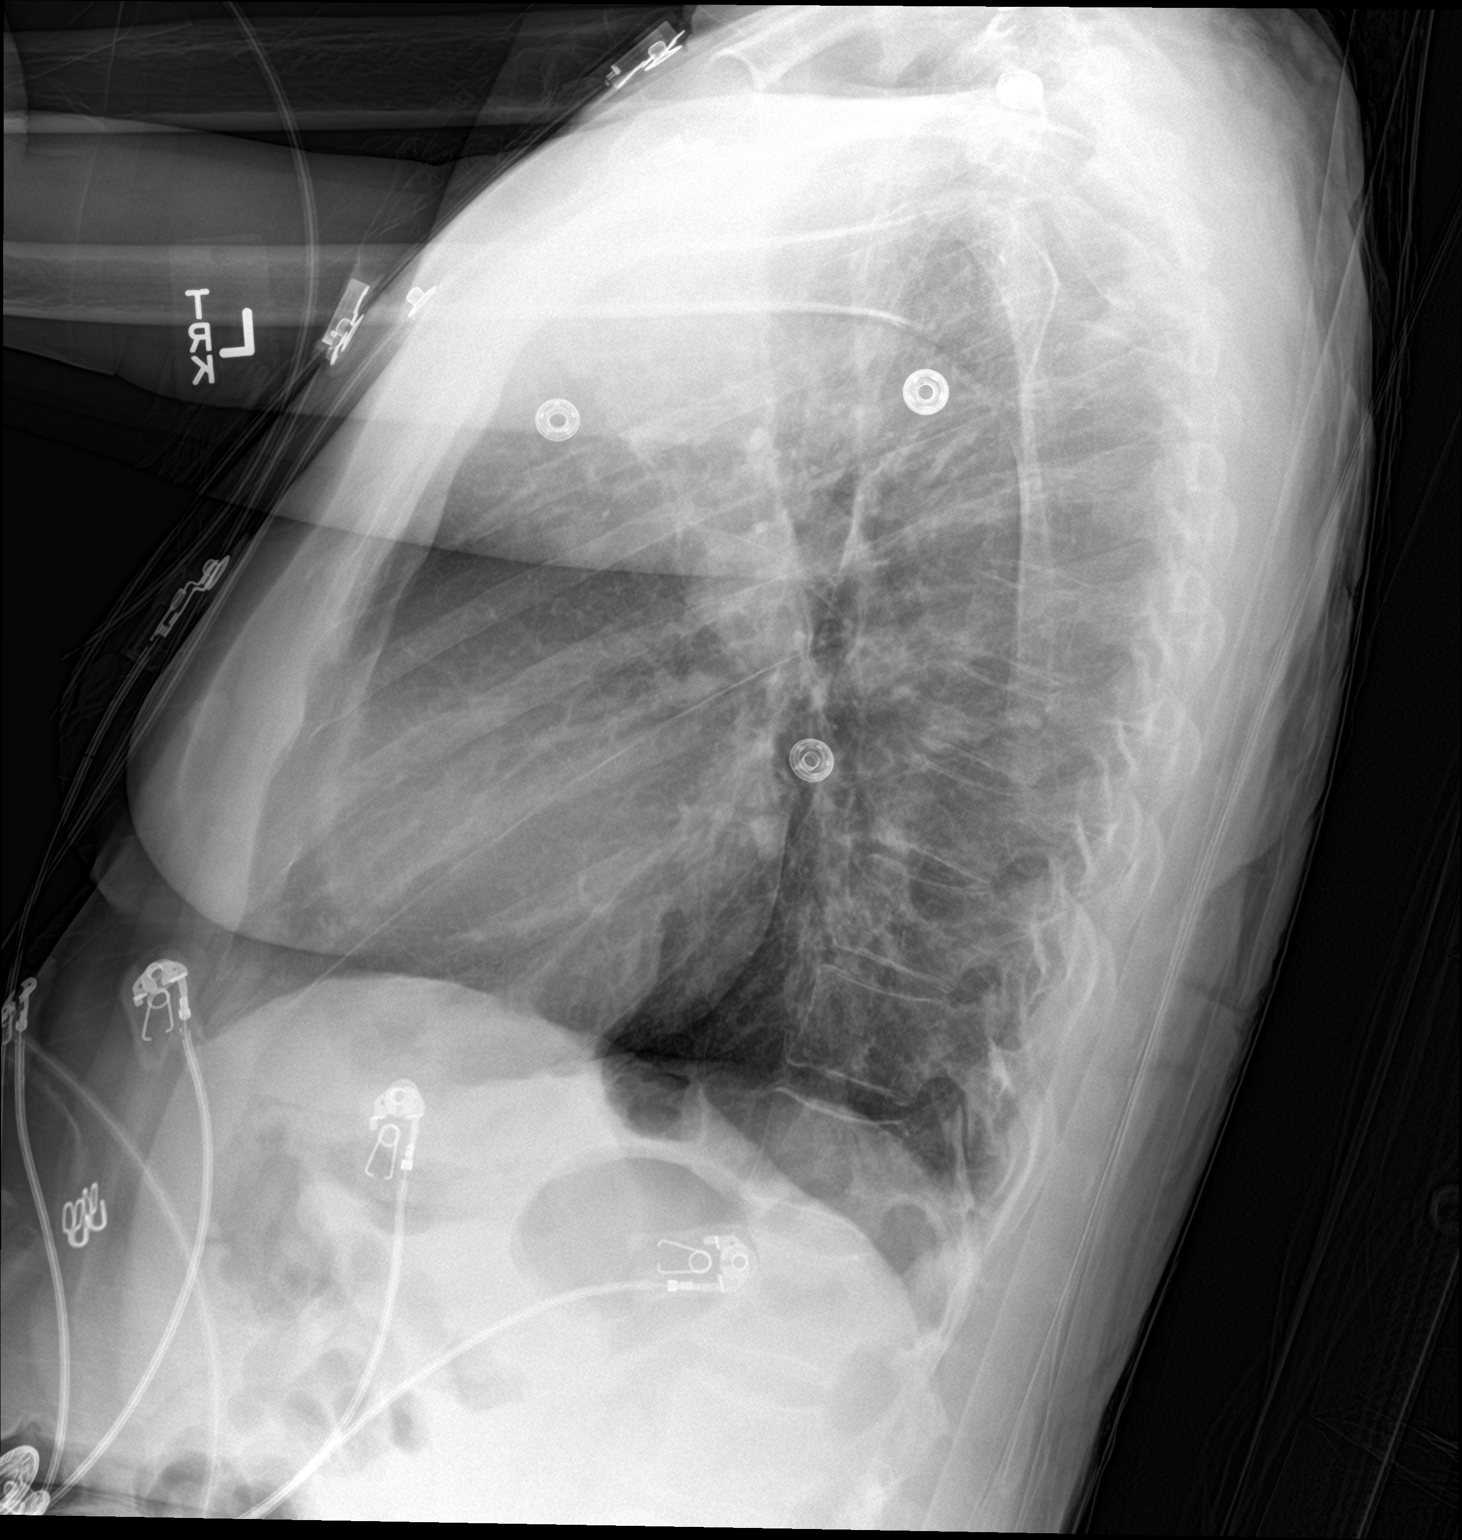

[2 of 2 positions shown; findings below may reference images not displayed]

FINDINGS: The lungs are mildly hyperinflated. There is no focal infiltrate.
The lung markings are coarse in the infrahilar regions but are
stable. The heart and pulmonary vascularity are normal. The
mediastinum is normal in width. There is faint calcification in the
wall of the aortic arch. The trachea is midline.
IMPRESSION: Chronic bibasilar interstitial changes. No alveolar pneumonia nor
CHF. If there are clinical concerns of recurrent pulmonary embolism,
chest CT scanning would be a useful next imaging step.

Thoracic aortic atherosclerosis.

## 2017-10-17 IMAGING — CT CT ANGIO CHEST
2 of 6 series · 18 of 36 positions shown · IV contrast (iopamidol)
Comparison: CT scans dated 07/22/2011 and 03/05/2011

CLINICAL DATA: Right-sided chest pain. Shortness of breath. History
of pulmonary embolism.

EXAM:
CT ANGIOGRAPHY CHEST WITH CONTRAST
TECHNIQUE: Multidetector CT imaging of the chest was performed using the
standard protocol during bolus administration of intravenous
contrast. Multiplanar CT image reconstructions and MIPs were
obtained to evaluate the vascular anatomy.
CONTRAST:  70mL 09CVHS-RP3 IOPAMIDOL (09CVHS-RP3) INJECTION 76%

[Series 7: pe thins · axial · 0.72mm/px · z∈[+1219,+1466]mm · 17 of 279 slices shown]
[im 16/279  lung]
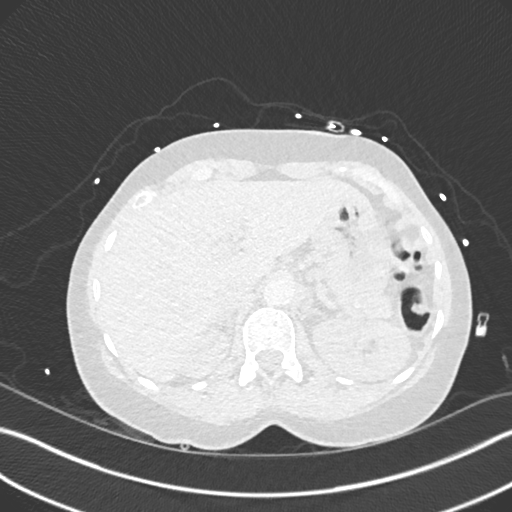
[im 31/279  mediastinal]
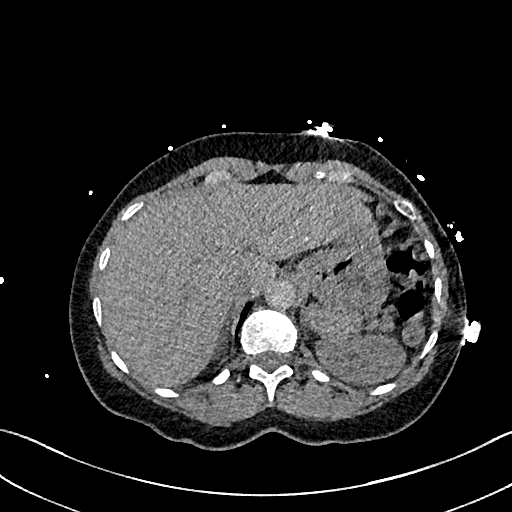
[im 47/279  lung]
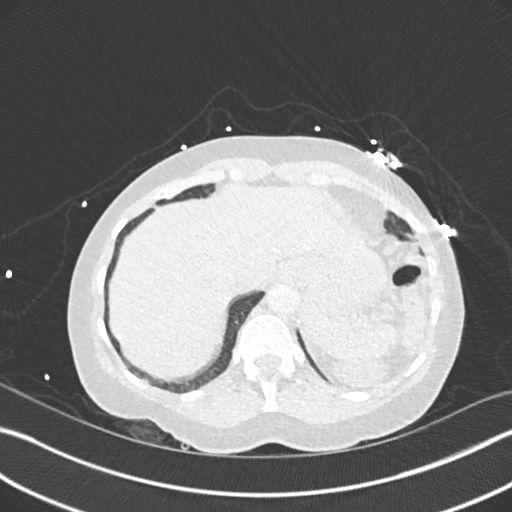
[im 62/279  mediastinal]
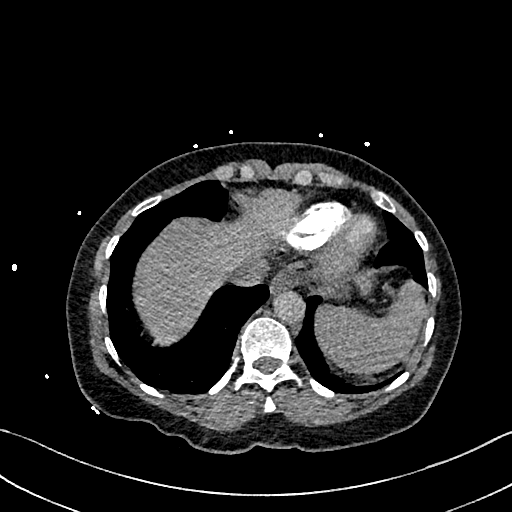
[im 78/279  lung]
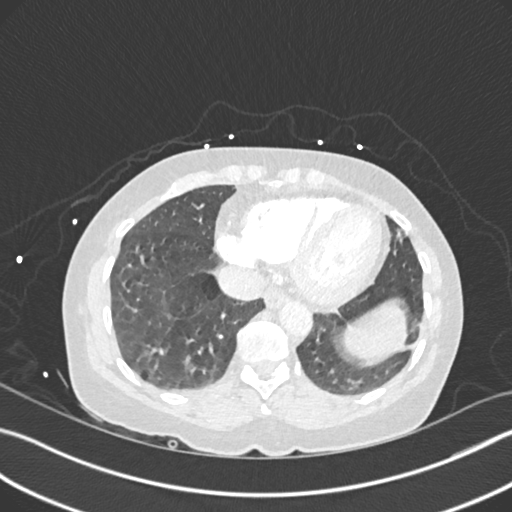
[im 93/279  mediastinal]
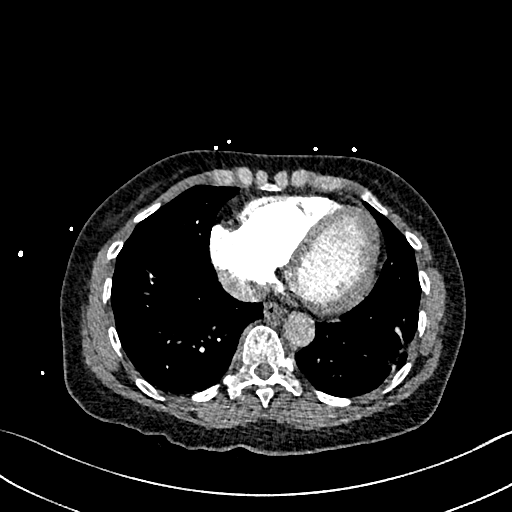
[im 109/279  lung]
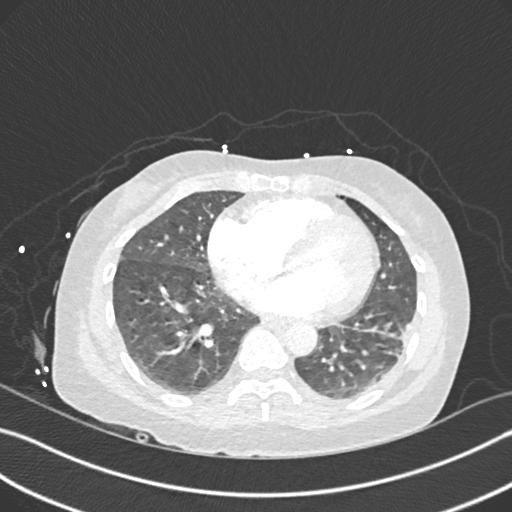
[im 124/279  mediastinal]
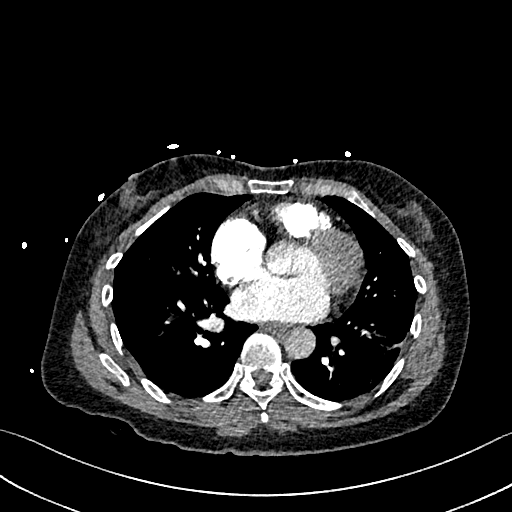
[im 140/279  lung]
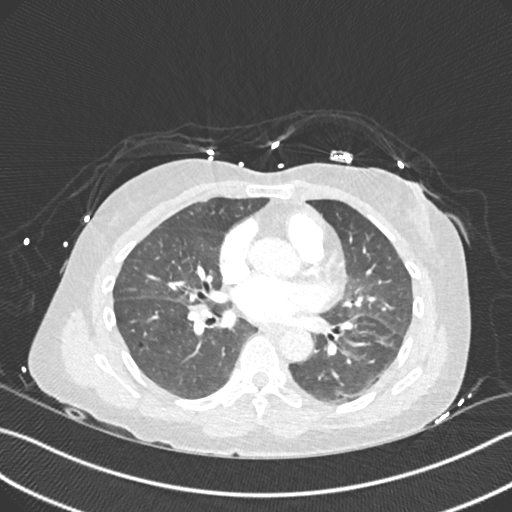
[im 155/279  mediastinal]
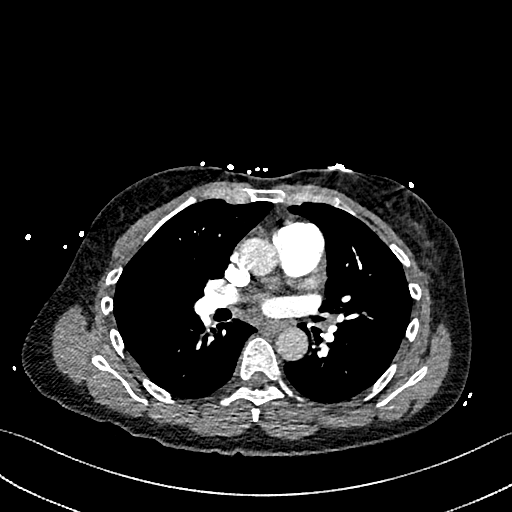
[im 170/279  lung]
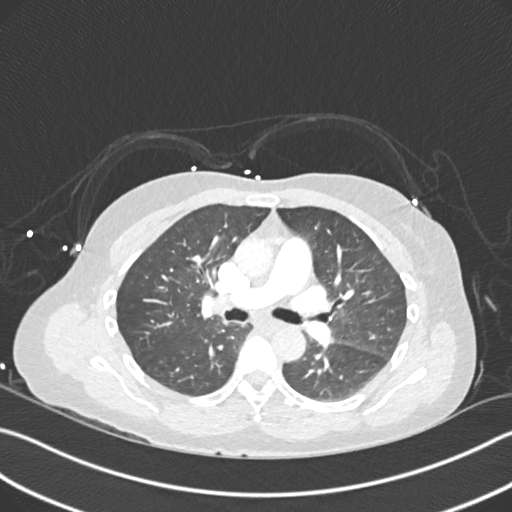
[im 186/279  mediastinal]
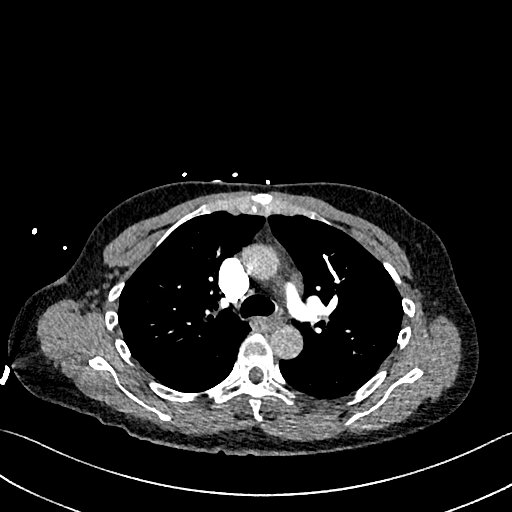
[im 201/279  lung]
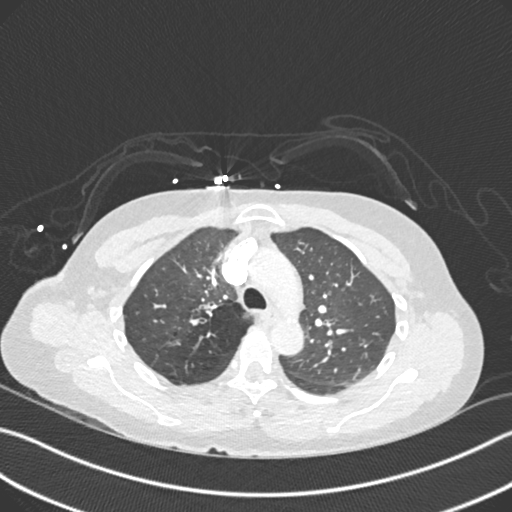
[im 217/279  mediastinal]
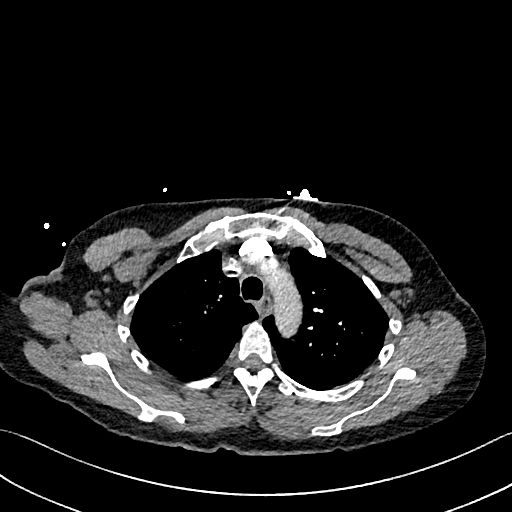
[im 232/279  lung]
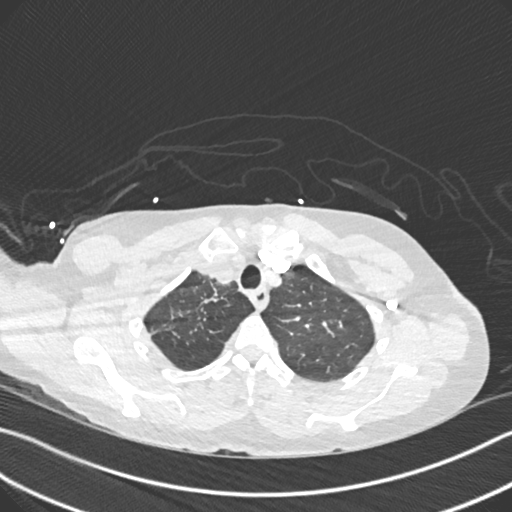
[im 248/279  mediastinal]
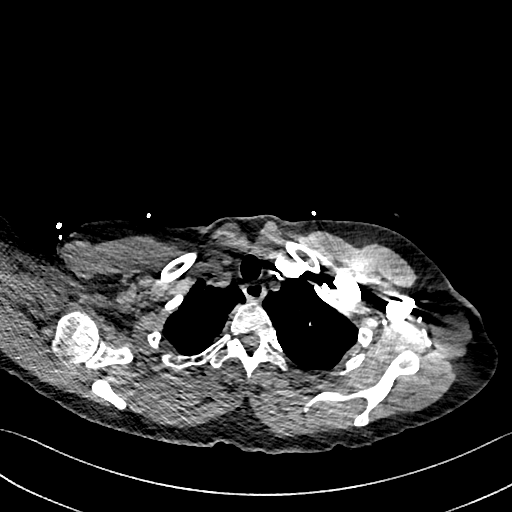
[im 263/279  lung]
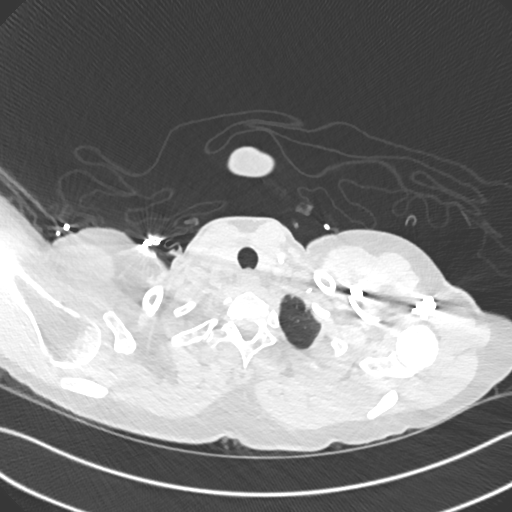

[Series 8: pe 2mm cor · coronal · 0.55mm/px · 1 of 121 slices shown]
[im 61/121  mediastinal]
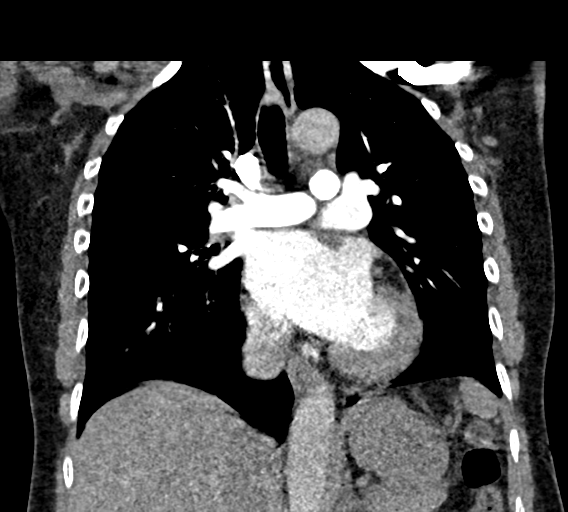

[18 of 36 positions shown; findings below may reference images not displayed]

FINDINGS: Cardiovascular: Satisfactory opacification of the pulmonary arteries
to the segmental level. No evidence of pulmonary embolism. Normal
heart size. No pericardial effusion.

Mediastinum/Nodes: No enlarged mediastinal, hilar, or axillary lymph
nodes. Thyroid gland, trachea, and esophagus demonstrate no
significant findings. Stable small hilar lymph nodes.

Lungs/Pleura: Slight scarring in the left lower lobe. Small blebs in
the right lower lobe, unchanged. No effusions.

Upper Abdomen: Normal.

Musculoskeletal: No chest wall abnormality. No acute or significant
osseous findings.

Review of the MIP images confirms the above findings.
IMPRESSION: 1. No acute abnormality.  Specifically, no pulmonary embolism.
2. Chronic scarring in the left lower lobe.

## 2017-10-17 MED ORDER — IOPAMIDOL (ISOVUE-370) INJECTION 76%
INTRAVENOUS | Status: AC
  Administered 2017-10-17: 70 mL
  Filled 2017-10-17: qty 100

## 2017-10-17 MED ORDER — QUETIAPINE FUMARATE 50 MG PO TABS: 50.0000 mg | ORAL_TABLET | Freq: Every day | ORAL | Status: DC

## 2017-10-17 MED ORDER — WARFARIN - PHARMACIST DOSING INPATIENT: Freq: Every day | Status: DC

## 2017-10-17 MED ORDER — WARFARIN SODIUM 7.5 MG PO TABS
7.5000 mg | ORAL_TABLET | Freq: Once | ORAL | Status: DC
  Filled 2017-10-17: qty 1

## 2017-10-17 MED ORDER — POTASSIUM CHLORIDE CRYS ER 20 MEQ PO TBCR
40.0000 meq | EXTENDED_RELEASE_TABLET | Freq: Once | ORAL | Status: AC
  Administered 2017-10-17: 40 meq via ORAL
  Filled 2017-10-17: qty 2

## 2017-10-17 MED ORDER — ONDANSETRON HCL 4 MG/2ML IJ SOLN
4.0000 mg | Freq: Once | INTRAMUSCULAR | Status: AC
  Administered 2017-10-17: 4 mg via INTRAVENOUS
  Filled 2017-10-17: qty 2

## 2017-10-17 MED ORDER — AMITRIPTYLINE HCL 25 MG PO TABS: 25.0000 mg | ORAL_TABLET | Freq: Every day | ORAL | Status: DC

## 2017-10-17 MED ORDER — MIRTAZAPINE 15 MG PO TABS: 15.0000 mg | ORAL_TABLET | Freq: Every day | ORAL | Status: DC

## 2017-10-17 MED ORDER — ARIPIPRAZOLE 10 MG PO TABS
5.0000 mg | ORAL_TABLET | Freq: Once | ORAL | Status: AC
  Administered 2017-10-17: 5 mg via ORAL
  Filled 2017-10-17: qty 1

## 2017-10-17 NOTE — ED Provider Notes (Signed)
Assumed care from Dr. Darl Householder at 1600.  Patient evaluated by psychiatry and feel that she is reasonable for outpatient psychiatric follow-up.  Patient has good strong family support and good follow-up and resources.  Patient has no symptoms today requiring further medical evaluation or attention.  We will discharged home.   Blanchie Dessert, MD 10/17/17 [phone]

## 2017-10-17 NOTE — Progress Notes (Addendum)
ANTICOAGULATION CONSULT NOTE - Initial Consult  Pharmacy Consult for warfarin Indication: hx recurrent PEs/DVTs  No Known Allergies  Patient Measurements: Height: 5\' 2"  (157.5 cm) Weight: 137 lb (62.1 kg) IBW/kg (Calculated) : 50.1  Vital Signs: Temp: 98.7 F (37.1 C) (02/25 1227) Temp Source: Oral (02/25 1227) BP: 128/84 (02/25 1345) Pulse Rate: 80 (02/25 1345)  Labs: Recent Labs    10/17/17 1313  HGB 12.5  HCT 35.8*  PLT 215  LABPROT 13.4  INR 1.03  CREATININE 0.78    Estimated Creatinine Clearance: 69.7 mL/min (by C-G formula based on SCr of 0.78 mg/dL).   Medical History: Past Medical History:  Diagnosis Date  . Alcohol abuse   . Anxiety   . COPD (chronic obstructive pulmonary disease) ([place])   . Depression   . History of kidney stones   . Insomnia   . PE (pulmonary embolism) 2006  . Polysubstance abuse ([place])    Current smoking and alcohol. 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 Marijuna not taking since 7 month.   . Weight loss    due to anxiety    Medications:  Scheduled:  . amitriptyline  25 mg Oral QHS  . ARIPiprazole  5 mg Oral Once  . mirtazapine  15 mg Oral QHS  . ondansetron (ZOFRAN) IV  4 mg Intravenous Once  . potassium chloride SA  40 mEq Oral Once  . QUEtiapine  50 mg Oral QHS    Assessment: 40 yof coming in with SOB, dizziness, and weaknesses for 3 days. On warfarin PTA, with last dose on 2/20. Home regimen is 5 mg daily except 7.5 mg on Tuesday.  INR is subtherapeutic at 1.03. CTA showing no pulmonary embolism. CBC is stable. No signs/symptoms of bleeding. No new medication interactions.  Goal of Therapy:  INR 2-3 Monitor platelets by anticoagulation protocol: Yes   Plan:  Order warfarin 7.5 mg daily since subtherapeutic  Monitor daily INR and CBC Monitor signs/symptoms of bleeding  Doylene Canard, PharmD Clinical Pharmacist  Pager: [phone] Clinical Phone for 10/17/2017 until 3:30pm: [phone] If after  3:30pm, please call main pharmacy at [phone] 10/17/2017,3:19 PM

## 2017-10-17 NOTE — ED Notes (Signed)
Patient coming from home complaining of shortness of breath, dizziness, and weakness x 3 days. Pt reports right sided flank pain. Hx of pe. Has been off blood thinner for 1 week. Lungs clear with ems. NSR. Patient alert and oriented x4.

## 2017-10-17 NOTE — ED Notes (Signed)
Patient transported to X-ray 

## 2017-10-17 NOTE — ED Notes (Signed)
Patent vomited in CT. MD aware
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
## 2017-10-17 NOTE — ED Provider Notes (Addendum)
[facility] EMERGENCY DEPARTMENT Provider Note   CSN: [id number] Arrival date & time: 10/17/17  1225     History   Chief Complaint Chief Complaint  Patient presents with  . Shortness of Breath    HPI Emily Cameron is a 57 y.o. female depression, anxiety, and history of PE/DVT presenting with the chief complaint of SOB.  Patient states she has been having intermittent DOE for about 3-4 days duration, this is associated with pleuritic right sided lateral chest wall pain. Denies cough. The patient has difficulty describing the pain, but states it feels "heavy." She has not taken any of her medications in about 1 week, including her coumadin. Per chart review, the patient was subtherapeutic with an INR of 1.3 three weeks ago. States her PE was 11 years ago. Denies leg redness or swelling, but has been having cramping like pains in her legs. Denies anterior chest pain, abdominal pain, N/V/D, fevers, or chills.   The patient states she has been feeling very depressed. She was evicted from her home 7 months ago lives with her daughter. States she feels very alone. She has been having thoughts of SI and HI. She states she has thought about jumping in front of a car or jumping off a bridge a few days ago. Denies visual or auditory hallucinations.       Past Medical History:  Diagnosis Date  . Alcohol abuse   . Anxiety   . COPD (chronic obstructive pulmonary disease) ([place])   . Depression   . History of kidney stones   . Insomnia   . PE (pulmonary embolism) 2006  . Polysubstance abuse ([place])    Current smoking and alcohol. 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 Marijuna not taking since 7 month.   . Weight loss    due to anxiety    Patient Active Problem List   Diagnosis Date Noted  . Chronic pain syndrome 05/08/2015  . Breast cancer screening 07/04/2014  . DVT (deep venous thrombosis) ([place]) 12/24/2013  . Blurry vision, bilateral 09/24/2013  .  Hoarseness of voice 09/24/2013  . MDD (major depressive disorder) 05/01/2013  . Hypoglycemia 05/08/2012  . Hypotension 05/03/2012  . COPD (chronic obstructive pulmonary disease) ([place]) 03/31/2012  . Sleep apnea 09/06/2011  . Constipation 09/06/2011  . Anxiety 09/06/2011  . Vitamin D deficiency 09/06/2011  . Chest pain 05/03/2011  . Reflux 05/03/2011  . Tobacco abuse 03/19/2011  . Monitoring for long-term anticoagulant use 03/09/2011  . Other pulmonary embolism without acute cor pulmonale ([place]) 03/05/2011  . Pulmonary nodule 03/05/2011  . HYPERLIPIDEMIA 12/12/2008  . SHOULDER PAIN, RIGHT 05/27/2008  . ALLERGIC RHINITIS DUE TO POLLEN 04/15/2008  . Other depressive episodes 06/29/2007  . Adjustment insomnia 06/29/2007  . Loss of appetite 06/29/2007    Past Surgical History:  Procedure Laterality Date  . ABDOMINAL HYSTERECTOMY    . CYST EXCISION    . LITHOTRIPSY    . MICROLARYNGOSCOPY Left 06/20/2014   Procedure: MICROLARYNGOSCOPY WITH REMOVAL OF LEFT VOCAL CORD MASS;  Surgeon: Melissa Montane, MD;  Location: [facility];  Service: ENT;  Laterality: Left;    OB History    No data available       Home Medications    Prior to Admission medications   Medication Sig Start Date End Date Taking? Authorizing Provider  amitriptyline (ELAVIL) 25 MG tablet TAKE ONE TABLET BY MOUTH EVERY DAY at bedtime 09/23/17  Yes Jegede, Marlena Clipper, MD  ARIPiprazole (ABILIFY)  5 MG tablet TAKE ONE TABLET BY MOUTH EVERY DAY *needs office visit FOR furhter refills* 07/22/17  Yes Tresa Garter, MD  megestrol (MEGACE) 40 MG/ML suspension TAKE 10 MLS (2 TEASPOONSFUL) BY MOUTH 2 TIMES A WEEK. 06/15/17  Yes McClung, Angela M, PA-C  mirtazapine (REMERON) 15 MG tablet TAKE ONE TABLET BY MOUTH AT BEDTIME 07/22/17  Yes Jegede, Olugbemiga E, MD  potassium chloride (K-DUR) 10 MEQ tablet Take 1 tablet (10 mEq total) by mouth daily. 06/15/17  Yes McClung, Angela M, PA-C  QUEtiapine (SEROQUEL) 50 MG tablet Take 1 tablet  (50 mg total) by mouth at bedtime. 06/17/17  Yes Tresa Garter, MD  Tetrahydrozoline HCl (VISINE OP) Place 2 drops into both eyes as needed (for dry eyes). Reported on 09/09/2015   Yes [provider]  traMADol (ULTRAM) 50 MG tablet Take 1 tablet (50 mg total) by mouth every 8 (eight) hours as needed. 09/22/17  Yes Argentina Donovan, PA-C  warfarin (COUMADIN) 5 MG tablet TAKE ONE TABLET BY MOUTH EVERY DAY EXCEPT ON tuesdays take ONE AND ONE-HALF tablets 08/24/17  Yes Tresa Garter, MD    Family History Family History  Problem Relation Age of Onset  . Aneurysm Brother 66  . Aneurysm Paternal Aunt 67       Died   . Coronary artery disease Sister   . Heart disease Mother   . Hyperlipidemia Mother   . Hypertension Mother   . Heart disease Maternal Grandmother   . Pulmonary embolism Brother     Social History Social History   Tobacco Use  . Smoking status: Current Every Day Smoker    Packs/day: 0.50    Years: 31.00    Pack years: 15.50    Types: Cigarettes  . Smokeless tobacco: Never Used  . Tobacco comment: TO LET DOCTOR KNOW WHEN READY TO QUIT.  Substance Use Topics  . Alcohol use: Yes    Comment: 2 wine coolers per weekend   . Drug use: Yes    Frequency: 7.0 times per week    Types: Marijuana     Allergies   Patient has no known allergies.   Review of Systems Review of Systems  Constitutional: Positive for appetite change and fatigue. Negative for fever.  HENT: Negative.   Respiratory: Positive for chest tightness and shortness of breath. Negative for cough.   Cardiovascular: Negative for chest pain and leg swelling.  Gastrointestinal: Negative for abdominal pain, diarrhea, nausea and vomiting.  Genitourinary: Negative.   Skin: Negative.   Neurological: Negative.   Psychiatric/Behavioral: Positive for suicidal ideas. The patient is nervous/anxious.      Physical Exam Updated Vital Signs BP (!) 148/93   Pulse 88   Temp 98.7 F (37.1 C)  (Oral)   Resp 20   Ht 5\' 2"  (1.575 m)   Wt 62.1 kg (137 lb)   LMP 01/26/2004   SpO2 100%   BMI 25.06 kg/m   Physical Exam  Constitutional: She is oriented to person, place, and time. She appears well-developed and well-nourished. No distress.  HENT:  Head: Normocephalic and atraumatic.  Mouth/Throat: Oropharynx is clear and moist.  Eyes: EOM are normal. Pupils are equal, round, and reactive to light. Left conjunctiva is injected. Left conjunctiva has a hemorrhage.  Neck: Normal range of motion. Neck supple.  Cardiovascular: Normal rate, regular rhythm, normal heart sounds and intact distal pulses.  Pulmonary/Chest: Effort normal and breath sounds normal.  Musculoskeletal: Normal range of motion. She exhibits no edema  or tenderness.  Neurological: She is alert and oriented to person, place, and time.  Skin: Skin is warm and dry.  Psychiatric: She exhibits a depressed mood.     ED Treatments / Results  Labs (all labs ordered are listed, but only abnormal results are displayed) Labs Reviewed  CBC WITH DIFFERENTIAL/PLATELET - Abnormal; Notable for the following components:      Result Value   HCT 35.8 (*)    All other components within normal limits  COMPREHENSIVE METABOLIC PANEL - Abnormal; Notable for the following components:   Potassium 3.2 (*)    BUN <5 (*)    Albumin 3.4 (*)    ALT 11 (*)    All other components within normal limits  BRAIN NATRIURETIC PEPTIDE  PROTIME-INR  RAPID URINE DRUG SCREEN, HOSP PERFORMED  URINALYSIS, ROUTINE W REFLEX MICROSCOPIC  ETHANOL  ACETAMINOPHEN LEVEL  SALICYLATE LEVEL  I-STAT TROPONIN, ED    EKG  EKG Interpretation  Date/Time:  Monday October 17 2017 12:30:09 EST Ventricular Rate:  83 PR Interval:    QRS Duration: 96 QT Interval:  387 QTC Calculation: 455 R Axis:   -55 Text Interpretation:  Sinus rhythm Left anterior fascicular block Left ventricular hypertrophy ST elev, probable normal early repol pattern No significant  change since last tracing Confirmed by Wandra Arthurs [phone]) on 10/17/2017 1:32:08 PM       Radiology Dg Chest 2 View  Result Date: 10/17/2017 CLINICAL DATA:  Shortness of breath, exertional right flank pain, fatigue, and dizziness for the past 3 days. History of previous pulmonary embolism. Discontinued Coumadin 1 week ago. History of COPD, current smoker. EXAM: CHEST  2 VIEW COMPARISON:  PA and lateral chest x-ray of March 22, 2017 FINDINGS: The lungs are mildly hyperinflated. There is no focal infiltrate. The lung markings are coarse in the infrahilar regions but are stable. The heart and pulmonary vascularity are normal. The mediastinum is normal in width. There is faint calcification in the wall of the aortic arch. The trachea is midline. IMPRESSION: Chronic bibasilar interstitial changes. No alveolar pneumonia nor CHF. If there are clinical concerns of recurrent pulmonary embolism, chest CT scanning would be a useful next imaging step. Thoracic aortic atherosclerosis. Electronically Signed   By: David  Martinique M.D.   On: 10/17/2017 14:10   Ct Angio Chest Pe W And/or Wo Contrast  Result Date: 10/17/2017 CLINICAL DATA:  Right-sided chest pain. Shortness of breath. History of pulmonary embolism. EXAM: CT ANGIOGRAPHY CHEST WITH CONTRAST TECHNIQUE: Multidetector CT imaging of the chest was performed using the standard protocol during bolus administration of intravenous contrast. Multiplanar CT image reconstructions and MIPs were obtained to evaluate the vascular anatomy. CONTRAST:  49mL ISOVUE-370 IOPAMIDOL (ISOVUE-370) INJECTION 76% COMPARISON:  CT scans dated 07/22/2011 and 03/05/2011 FINDINGS: Cardiovascular: Satisfactory opacification of the pulmonary arteries to the segmental level. No evidence of pulmonary embolism. Normal heart size. No pericardial effusion. Mediastinum/Nodes: No enlarged mediastinal, hilar, or axillary lymph nodes. Thyroid gland, trachea, and esophagus demonstrate no significant  findings. Stable small hilar lymph nodes. Lungs/Pleura: Slight scarring in the left lower lobe. Small blebs in the right lower lobe, unchanged. No effusions. Upper Abdomen: Normal. Musculoskeletal: No chest wall abnormality. No acute or significant osseous findings. Review of the MIP images confirms the above findings. IMPRESSION: 1. No acute abnormality.  Specifically, no pulmonary embolism. 2. Chronic scarring in the left lower lobe. Electronically Signed   By: Lorriane Shire M.D.   On: 10/17/2017 15:05    Procedures Procedures (  including critical care time)  Medications Ordered in ED Medications  potassium chloride SA (K-DUR,KLOR-CON) CR tablet 40 mEq (not administered)  ondansetron (ZOFRAN) injection 4 mg (not administered)  ARIPiprazole (ABILIFY) tablet 5 mg (not administered)  mirtazapine (REMERON) tablet 15 mg (not administered)  QUEtiapine (SEROQUEL) tablet 50 mg (not administered)  amitriptyline (ELAVIL) tablet 25 mg (not administered)  iopamidol (ISOVUE-370) 76 % injection (70 mLs  Contrast Given 10/17/17 1449)     Initial Impression / Assessment and Plan / ED Course  I have reviewed the triage vital signs and the nursing notes.  Pertinent labs & imaging results that were available during my care of the patient were reviewed by me and considered in my medical decision making (see chart for details).   57 yo female with PMH of depression and PE/DVT non-compliant with coumadin presenting with intermittent dyspnea on exertion for several days duration, as well as depression and SI. Vital signs stable without tachycardia, tachypnea, or hypoxia. Cardiopulmonary exam within noraml limits, no focal findings. No lower extremity swelling or erythema. CBC within normal limits. CMET with mild hypokalemia, otherwise no significant abnormalities. BNP 33.9, I-stat troponin 0.00, INR 1.03. CXR with chronic bibasilar interstitial changes, no focal opacity or edema. CTA of chest negative for PE or  acute abnormality. Restarted patient's coumadin per pharmacy dosing.  Patient has a history of depression and is endorsing suicidal thoughts. She has not been taking any of her medications. Will restart patient's medications. Psychiatric evaluation/consult pending. Patient will be placed in holding until psychiatric evaluation. She has been medically cleared.  Patient signed out to Dr. Maryan Rued.   Final Clinical Impressions(s) / ED Diagnoses   Final diagnoses:  Depression with suicidal ideation  Shortness of breath    ED Discharge Orders    None       Melanee Spry, MD 10/17/17 1550    Melanee Spry, MD 10/17/17 1555    Drenda Freeze, MD 10/18/17 [phone]

## 2017-10-18 ENCOUNTER — Other Ambulatory Visit: Payer: Self-pay | Admitting: Internal Medicine

## 2017-10-18 DIAGNOSIS — G894 Chronic pain syndrome: Secondary | ICD-10-CM

## 2017-11-15 ENCOUNTER — Encounter: Payer: Self-pay | Admitting: Pharmacist

## 2017-11-22 ENCOUNTER — Other Ambulatory Visit: Payer: Self-pay | Admitting: Internal Medicine

## 2017-11-22 DIAGNOSIS — G894 Chronic pain syndrome: Secondary | ICD-10-CM

## 2017-11-22 DIAGNOSIS — F5102 Adjustment insomnia: Secondary | ICD-10-CM

## 2017-11-23 ENCOUNTER — Ambulatory Visit: Payer: Self-pay

## 2017-11-28 ENCOUNTER — Telehealth: Payer: Self-pay

## 2017-11-28 MED ORDER — MEGESTROL ACETATE 40 MG/ML PO SUSP: ORAL | 0 refills | Status: DC

## 2017-11-28 NOTE — Telephone Encounter (Signed)
Contacted pt and made aware of Dr. Wynetta Emery response pt became upset and stated that she will be up here tomorrow to get her records because she is done with community health

## 2017-11-28 NOTE — Telephone Encounter (Signed)
Pt contacted the office and is requesting a refill on Tramadol and Megestrol and would like it sent to [facility]

## 2017-12-12 ENCOUNTER — Other Ambulatory Visit: Payer: Self-pay | Admitting: Internal Medicine

## 2017-12-12 DIAGNOSIS — G894 Chronic pain syndrome: Secondary | ICD-10-CM

## 2017-12-12 DIAGNOSIS — F5102 Adjustment insomnia: Secondary | ICD-10-CM

## 2017-12-12 DIAGNOSIS — I2699 Other pulmonary embolism without acute cor pulmonale: Secondary | ICD-10-CM

## 2017-12-21 ENCOUNTER — Other Ambulatory Visit: Payer: Self-pay | Admitting: Internal Medicine

## 2017-12-23 ENCOUNTER — Ambulatory Visit: Payer: Self-pay | Admitting: Internal Medicine

## 2017-12-23 ENCOUNTER — Other Ambulatory Visit: Payer: Self-pay | Admitting: *Deleted

## 2017-12-30 ENCOUNTER — Other Ambulatory Visit: Payer: Self-pay

## 2018-01-17 ENCOUNTER — Other Ambulatory Visit: Payer: Self-pay | Admitting: Internal Medicine

## 2018-02-08 ENCOUNTER — Ambulatory Visit (HOSPITAL_COMMUNITY)
Admission: EM | Admit: 2018-02-08 | Discharge: 2018-02-08 | Disposition: A | Discharge status: Home / Self Care | Payer: Medicaid Other | Attending: Internal Medicine | Admitting: Internal Medicine

## 2018-02-08 ENCOUNTER — Encounter (HOSPITAL_COMMUNITY): Payer: Self-pay | Admitting: Emergency Medicine

## 2018-02-08 DIAGNOSIS — F17210 Nicotine dependence, cigarettes, uncomplicated: Secondary | ICD-10-CM | POA: Diagnosis not present

## 2018-02-08 DIAGNOSIS — Z79899 Other long term (current) drug therapy: Secondary | ICD-10-CM | POA: Diagnosis not present

## 2018-02-08 DIAGNOSIS — Z7901 Long term (current) use of anticoagulants: Secondary | ICD-10-CM | POA: Diagnosis not present

## 2018-02-08 DIAGNOSIS — R350 Frequency of micturition: Secondary | ICD-10-CM | POA: Diagnosis not present

## 2018-02-08 LAB — POCT URINALYSIS DIP (DEVICE)
Glucose, UA: NEGATIVE mg/dL
KETONES UR: NEGATIVE mg/dL
Nitrite: NEGATIVE
PH: 6.5 (ref 5.0–8.0)
Protein, ur: 100 mg/dL — AB
Specific Gravity, Urine: 1.015 (ref 1.005–1.030)
Urobilinogen, UA: 2 mg/dL — ABNORMAL HIGH (ref 0.0–1.0)

## 2018-02-08 MED ORDER — NITROFURANTOIN MONOHYD MACRO 100 MG PO CAPS: 100.0000 mg | ORAL_CAPSULE | Freq: Two times a day (BID) | ORAL | 0 refills | Status: AC

## 2018-02-08 NOTE — ED Provider Notes (Signed)
[place]    CSN: [id number] Arrival date & time: 02/08/18  1410     History   Chief Complaint Chief Complaint  Patient presents with  . Urinary Tract Infection    HPI NATELIE OSTROSKY is a 57 y.o. female no contributing past medical history patient is presenting with symptoms of urgency, increased frequency, incomplete voiding, abdominal pressure. Denies dysuria, hematuria, vaginal discharge. Denies fever, nausea, vomiting, flank/back pain.  Has had previous UTIs, last one was approximately 1 year ago.  Also noting odor to urine.  Symptoms have been going on for approximately 1 week.   HPI  Past Medical History:  Diagnosis Date  . Alcohol abuse   . Anxiety   . COPD (chronic obstructive pulmonary disease) ([place])   . Depression   . History of kidney stones   . Insomnia   . PE (pulmonary embolism) 2006  . Polysubstance abuse ([place])    Current smoking and alcohol. 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 Marijuna not taking since 7 month.   . Weight loss    due to anxiety    Patient Active Problem List   Diagnosis Date Noted  . Chronic pain syndrome 05/08/2015  . Breast cancer screening 07/04/2014  . DVT (deep venous thrombosis) ([place]) 12/24/2013  . Blurry vision, bilateral 09/24/2013  . Hoarseness of voice 09/24/2013  . MDD (major depressive disorder) 05/01/2013  . Hypoglycemia 05/08/2012  . Hypotension 05/03/2012  . COPD (chronic obstructive pulmonary disease) ([place]) 03/31/2012  . Sleep apnea 09/06/2011  . Constipation 09/06/2011  . Anxiety 09/06/2011  . Vitamin D deficiency 09/06/2011  . Chest pain 05/03/2011  . Reflux 05/03/2011  . Tobacco abuse 03/19/2011  . Monitoring for long-term anticoagulant use 03/09/2011  . Other pulmonary embolism without acute cor pulmonale ([place]) 03/05/2011  . Pulmonary nodule 03/05/2011  . HYPERLIPIDEMIA 12/12/2008  . SHOULDER PAIN, RIGHT 05/27/2008  . ALLERGIC RHINITIS DUE TO POLLEN 04/15/2008  . Other  depressive episodes 06/29/2007  . Adjustment insomnia 06/29/2007  . Loss of appetite 06/29/2007    Past Surgical History:  Procedure Laterality Date  . ABDOMINAL HYSTERECTOMY    . CYST EXCISION    . LITHOTRIPSY    . MICROLARYNGOSCOPY Left 06/20/2014   Procedure: MICROLARYNGOSCOPY WITH REMOVAL OF LEFT VOCAL CORD MASS;  Surgeon: Melissa Montane, MD;  Location: [facility];  Service: ENT;  Laterality: Left;    OB History   None      Home Medications    Prior to Admission medications   Medication Sig Start Date End Date Taking? Authorizing Provider  amitriptyline (ELAVIL) 25 MG tablet TAKE ONE TABLET BY MOUTH EVERY DAY at bedtime 09/23/17   [name] E, MD  ARIPiprazole (ABILIFY) 5 MG tablet TAKE ONE TABLET BY MOUTH EVERY DAY *needs office visit FOR furhter refills* 07/22/17   Tresa Garter, MD  megestrol (MEGACE) 40 MG/ML suspension TAKE 10 MLS (2 TEASPOONSFUL) BY MOUTH 2 TIMES A WEEK. 11/28/17   Ladell Pier, MD  mirtazapine (REMERON) 15 MG tablet TAKE ONE TABLET BY MOUTH AT BEDTIME 07/22/17   Jegede, Olugbemiga E, MD  nitrofurantoin, macrocrystal-monohydrate, (MACROBID) 100 MG capsule Take 1 capsule (100 mg total) by mouth 2 (two) times daily for 5 days. 02/08/18 02/13/18  Oaklyn Mans C, PA-C  potassium chloride (K-DUR) 10 MEQ tablet Take 1 tablet (10 mEq total) by mouth daily. 06/15/17   Argentina Donovan, PA-C  QUEtiapine (SEROQUEL) 50 MG tablet TAKE ONE TABLET BY MOUTH at  bedtime 01/18/18   Charlott Rakes, MD  Tetrahydrozoline HCl (VISINE OP) Place 2 drops into both eyes as needed (for dry eyes). Reported on 09/09/2015    [provider]  traMADol (ULTRAM) 50 MG tablet TAKE ONE TABLET BY MOUTH EVERY 8 HOURS AS NEEDED 10/18/17   Tresa Garter, MD  warfarin (COUMADIN) 5 MG tablet TAKE ONE TABLET BY MOUTH EVERY DAY EXCEPT ON tuesdays take ONE AND ONE-HALF tablets 08/24/17   Tresa Garter, MD    Family History Family History  Problem Relation Age of  Onset  . Aneurysm Brother 27  . Aneurysm Paternal Aunt 54       Died   . Coronary artery disease Sister   . Heart disease Mother   . Hyperlipidemia Mother   . Hypertension Mother   . Heart disease Maternal Grandmother   . Pulmonary embolism Brother     Social History Social History   Tobacco Use  . Smoking status: Current Every Day Smoker    Packs/day: 0.50    Years: 31.00    Pack years: 15.50    Types: Cigarettes  . Smokeless tobacco: Never Used  . Tobacco comment: TO LET DOCTOR KNOW WHEN READY TO QUIT.  Substance Use Topics  . Alcohol use: Yes    Comment: 2 wine coolers per weekend   . Drug use: Yes    Frequency: 7.0 times per week    Types: Marijuana     Allergies   Patient has no known allergies.   Review of Systems Review of Systems  Constitutional: Negative for fever.  Respiratory: Negative for shortness of breath.   Cardiovascular: Negative for chest pain.  Gastrointestinal: Negative for abdominal pain, diarrhea, nausea and vomiting.  Genitourinary: Positive for frequency and urgency. Negative for dysuria, flank pain, genital sores, hematuria, menstrual problem, vaginal bleeding, vaginal discharge and vaginal pain.  Musculoskeletal: Negative for back pain.  Skin: Negative for rash.  Neurological: Negative for dizziness, light-headedness and headaches.     Physical Exam Triage Vital Signs ED Triage Vitals [02/08/18 1433]  Enc Vitals Group     BP 129/67     Pulse Rate 66     Resp 18     Temp 98.3 F (36.8 C)     Temp Source Oral     SpO2 100 %     Weight      Height      Head Circumference      Peak Flow      Pain Score      Pain Loc      Pain Edu?      Excl. in [place]?    No data found.  Updated Vital Signs BP 129/67 (BP Location: Left Arm)   Pulse 66   Temp 98.3 F (36.8 C) (Oral)   Resp 18   LMP 01/26/2004   SpO2 100%   Visual Acuity Right Eye Distance:   Left Eye Distance:   Bilateral Distance:    Right Eye Near:   Left Eye  Near:    Bilateral Near:     Physical Exam  Constitutional: She appears well-developed and well-nourished. No distress.  HENT:  Head: Normocephalic and atraumatic.  Eyes: Conjunctivae are normal.  Neck: Neck supple.  Cardiovascular: Normal rate and regular rhythm.  No murmur heard. Pulmonary/Chest: Effort normal and breath sounds normal. No respiratory distress.  Abdominal: Soft. There is tenderness.  Mild tenderness to bilateral lower abdomen.  Musculoskeletal: She exhibits no edema.  Neurological: She  is alert.  Skin: Skin is warm and dry.  Psychiatric: She has a normal mood and affect.  Nursing note and vitals reviewed.    UC Treatments / Results  Labs (all labs ordered are listed, but only abnormal results are displayed) Labs Reviewed  POCT URINALYSIS DIP (DEVICE) - Abnormal; Notable for the following components:      Result Value   Bilirubin Urine SMALL (*)    Hgb urine dipstick TRACE (*)    Protein, ur 100 (*)    Urobilinogen, UA 2.0 (*)    Leukocytes, UA LARGE (*)    All other components within normal limits  URINE CULTURE    EKG None  Radiology No results found.  Procedures Procedures (including critical care time)  Medications Ordered in UC Medications - No data to display  Initial Impression / Assessment and Plan / UC Course  I have reviewed the triage vital signs and the nursing notes.  Pertinent labs & imaging results that were available during my care of the patient were reviewed by me and considered in my medical decision making (see chart for details).     UA showing large leukocytes. Will treat for UTI with Macrobid. Urine culture ordered, will call if need for any change in treatment. Patient without fever, nausea, vomiting, flank pain/CVA tenderness, unlikely pyelo.  Discussed strict return precautions. Patient verbalized understanding and is agreeable with plan.   Final Clinical Impressions(s) / UC Diagnoses   Final diagnoses:    Urinary frequency     Discharge Instructions     Urine showed evidence of infection. We are treating you with macrobid. Be sure to take full course. Stay hydrated- urine should be pale yellow to clear. My continue azo for relief of burning while infection is being cleared.   Please return or follow up with your primary provider if symptoms not improving with treatment. Please return sooner if you have worsening of symptoms or develop fever, nausea, vomiting, abdominal pain, back pain, lightheadedness, dizziness.   ED Prescriptions    Medication Sig Dispense Auth. Provider   nitrofurantoin, macrocrystal-monohydrate, (MACROBID) 100 MG capsule Take 1 capsule (100 mg total) by mouth 2 (two) times daily for 5 days. 10 capsule Demaris Leavell C, PA-C     Controlled Substance Prescriptions [place] Controlled Substance Registry consulted? Not Applicable   Janith Lima, [place] 02/08/18 2048

## 2018-02-08 NOTE — ED Triage Notes (Signed)
Pt here for UTI sx  

## 2018-02-08 NOTE — Discharge Instructions (Signed)
Urine showed evidence of infection. We are treating you with macrobid. Be sure to take full course. Stay hydrated- urine should be pale yellow to clear. My continue azo for relief of burning while infection is being cleared.  ° °Please return or follow up with your primary provider if symptoms not improving with treatment. Please return sooner if you have worsening of symptoms or develop fever, nausea, vomiting, abdominal pain, back pain, lightheadedness, dizziness. °

## 2018-02-11 ENCOUNTER — Telehealth (HOSPITAL_COMMUNITY): Payer: Self-pay

## 2018-02-11 LAB — URINE CULTURE

## 2018-02-11 NOTE — Telephone Encounter (Signed)
Urine culture was positive for E.coli and was given Macrobid at urgent care visit. Pt contacted and made aware, educated on completing antibiotic and to follow up if symptoms are persistent. Verbalized understanding.

## 2018-03-13 ENCOUNTER — Ambulatory Visit (HOSPITAL_COMMUNITY)
Admission: EM | Admit: 2018-03-13 | Discharge: 2018-03-13 | Disposition: A | Discharge status: Home / Self Care | Payer: Medicaid Other | Attending: Family Medicine | Admitting: Family Medicine

## 2018-03-13 ENCOUNTER — Encounter (HOSPITAL_COMMUNITY): Payer: Self-pay | Admitting: Emergency Medicine

## 2018-03-13 DIAGNOSIS — Z86711 Personal history of pulmonary embolism: Secondary | ICD-10-CM | POA: Diagnosis not present

## 2018-03-13 DIAGNOSIS — F419 Anxiety disorder, unspecified: Secondary | ICD-10-CM | POA: Diagnosis not present

## 2018-03-13 DIAGNOSIS — F17210 Nicotine dependence, cigarettes, uncomplicated: Secondary | ICD-10-CM | POA: Diagnosis not present

## 2018-03-13 DIAGNOSIS — Z79899 Other long term (current) drug therapy: Secondary | ICD-10-CM | POA: Insufficient documentation

## 2018-03-13 DIAGNOSIS — G894 Chronic pain syndrome: Secondary | ICD-10-CM | POA: Diagnosis not present

## 2018-03-13 DIAGNOSIS — N390 Urinary tract infection, site not specified: Secondary | ICD-10-CM | POA: Diagnosis present

## 2018-03-13 DIAGNOSIS — J449 Chronic obstructive pulmonary disease, unspecified: Secondary | ICD-10-CM | POA: Diagnosis not present

## 2018-03-13 DIAGNOSIS — E785 Hyperlipidemia, unspecified: Secondary | ICD-10-CM | POA: Diagnosis not present

## 2018-03-13 DIAGNOSIS — Z7901 Long term (current) use of anticoagulants: Secondary | ICD-10-CM | POA: Insufficient documentation

## 2018-03-13 DIAGNOSIS — G4700 Insomnia, unspecified: Secondary | ICD-10-CM | POA: Insufficient documentation

## 2018-03-13 DIAGNOSIS — F329 Major depressive disorder, single episode, unspecified: Secondary | ICD-10-CM | POA: Insufficient documentation

## 2018-03-13 DIAGNOSIS — G4730 Sleep apnea, unspecified: Secondary | ICD-10-CM | POA: Diagnosis not present

## 2018-03-13 DIAGNOSIS — E559 Vitamin D deficiency, unspecified: Secondary | ICD-10-CM | POA: Diagnosis not present

## 2018-03-13 DIAGNOSIS — K219 Gastro-esophageal reflux disease without esophagitis: Secondary | ICD-10-CM | POA: Diagnosis not present

## 2018-03-13 DIAGNOSIS — N760 Acute vaginitis: Secondary | ICD-10-CM | POA: Insufficient documentation

## 2018-03-13 DIAGNOSIS — N750 Cyst of Bartholin's gland: Secondary | ICD-10-CM | POA: Insufficient documentation

## 2018-03-13 LAB — POCT URINALYSIS DIP (DEVICE)
BILIRUBIN URINE: NEGATIVE
GLUCOSE, UA: NEGATIVE mg/dL
HGB URINE DIPSTICK: NEGATIVE
Ketones, ur: NEGATIVE mg/dL
NITRITE: NEGATIVE
Protein, ur: 30 mg/dL — AB
Specific Gravity, Urine: 1.02 (ref 1.005–1.030)
UROBILINOGEN UA: 0.2 mg/dL (ref 0.0–1.0)
pH: 6 (ref 5.0–8.0)

## 2018-03-13 MED ORDER — DOXYCYCLINE HYCLATE 100 MG PO CAPS: 100.0000 mg | ORAL_CAPSULE | Freq: Two times a day (BID) | ORAL | 0 refills | Status: DC

## 2018-03-13 MED ORDER — SULFAMETHOXAZOLE-TRIMETHOPRIM 800-160 MG PO TABS: 1.0000 | ORAL_TABLET | Freq: Two times a day (BID) | ORAL | 0 refills | Status: AC

## 2018-03-13 NOTE — ED Triage Notes (Signed)
Pt sts seen here on 6/19 for UTI and now having worsening sx

## 2018-03-13 NOTE — Discharge Instructions (Signed)
It appears that your bartholin gland is draining, with odor and itching we will treat this as infected at this time.  Will notify you of any positive findings from your vaginal sample and if any changes to treatment are needed.   Complete course of antibiotics.  Please establish with a primary care provider for recheck and for management of your medicines as it appears that you are to be taking Coumadin and this is a medication that needs to be monitored closely.

## 2018-03-13 NOTE — ED Provider Notes (Signed)
[facility]    CSN: [id number] Arrival date & time: 03/13/18  1122     History   Chief Complaint Chief Complaint  Patient presents with  . Urinary Tract Infection    HPI Emily Cameron is a 57 y.o. female.   Emily Cameron presents with complaints of vaginal odor and discharge which has worsened over the past few days. Was treated for UTI a month ago, urinary symptoms have improved. Denies urinary frequency or pain with urination. No blood in urine, no vaginal bleeding. States she has had bartholin cysts drained in the past, wonders if these are infected. No fevers. Vaginal itching started yesterday. Sexually active with 1 partner, denies concerns for STD's. States she is allergic to bath and body works products but used some to cleanse and wonder if this may have caused her symptoms. Hx of COPD, depression, kidney stones, PE, substance abuse.    ROS per HPI.      Past Medical History:  Diagnosis Date  . Alcohol abuse   . Anxiety   . COPD (chronic obstructive pulmonary disease) ([place])   . Depression   . History of kidney stones   . Insomnia   . PE (pulmonary embolism) 2006  . Polysubstance abuse ([place])    Current smoking and alcohol. 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 Marijuna not taking since 7 month.   . Weight loss    due to anxiety    Patient Active Problem List   Diagnosis Date Noted  . Chronic pain syndrome 05/08/2015  . Breast cancer screening 07/04/2014  . DVT (deep venous thrombosis) ([place]) 12/24/2013  . Blurry vision, bilateral 09/24/2013  . Hoarseness of voice 09/24/2013  . MDD (major depressive disorder) 05/01/2013  . Hypoglycemia 05/08/2012  . Hypotension 05/03/2012  . COPD (chronic obstructive pulmonary disease) ([place]) 03/31/2012  . Sleep apnea 09/06/2011  . Constipation 09/06/2011  . Anxiety 09/06/2011  . Vitamin D deficiency 09/06/2011  . Chest pain 05/03/2011  . Reflux 05/03/2011  . Tobacco abuse 03/19/2011  . Monitoring for  long-term anticoagulant use 03/09/2011  . Other pulmonary embolism without acute cor pulmonale ([place]) 03/05/2011  . Pulmonary nodule 03/05/2011  . HYPERLIPIDEMIA 12/12/2008  . SHOULDER PAIN, RIGHT 05/27/2008  . ALLERGIC RHINITIS DUE TO POLLEN 04/15/2008  . Other depressive episodes 06/29/2007  . Adjustment insomnia 06/29/2007  . Loss of appetite 06/29/2007    Past Surgical History:  Procedure Laterality Date  . ABDOMINAL HYSTERECTOMY    . CYST EXCISION    . LITHOTRIPSY    . MICROLARYNGOSCOPY Left 06/20/2014   Procedure: MICROLARYNGOSCOPY WITH REMOVAL OF LEFT VOCAL CORD MASS;  Surgeon: Melissa Montane, MD;  Location: [facility];  Service: ENT;  Laterality: Left;    OB History   None      Home Medications    Prior to Admission medications   Medication Sig Start Date End Date Taking? Authorizing Provider  amitriptyline (ELAVIL) 25 MG tablet TAKE ONE TABLET BY MOUTH EVERY DAY at bedtime 09/23/17   [name] E, MD  ARIPiprazole (ABILIFY) 5 MG tablet TAKE ONE TABLET BY MOUTH EVERY DAY *needs office visit FOR furhter refills* 07/22/17   Tresa Garter, MD  megestrol (MEGACE) 40 MG/ML suspension TAKE 10 MLS (2 TEASPOONSFUL) BY MOUTH 2 TIMES A WEEK. 11/28/17   Ladell Pier, MD  mirtazapine (REMERON) 15 MG tablet TAKE ONE TABLET BY MOUTH AT BEDTIME 07/22/17   Tresa Garter, MD  potassium chloride (K-DUR) 10 MEQ tablet  Take 1 tablet (10 mEq total) by mouth daily. 06/15/17   Argentina Donovan, PA-C  QUEtiapine (SEROQUEL) 50 MG tablet TAKE ONE TABLET BY MOUTH at bedtime 01/18/18   Charlott Rakes, MD  sulfamethoxazole-trimethoprim (BACTRIM DS) 800-160 MG tablet Take 1 tablet by mouth 2 (two) times daily for 7 days. 03/13/18 03/20/18  Zigmund Gottron, NP  Tetrahydrozoline HCl (VISINE OP) Place 2 drops into both eyes as needed (for dry eyes). Reported on 09/09/2015    [provider]  traMADol (ULTRAM) 50 MG tablet TAKE ONE TABLET BY MOUTH EVERY 8 HOURS AS NEEDED 10/18/17    Tresa Garter, MD  warfarin (COUMADIN) 5 MG tablet TAKE ONE TABLET BY MOUTH EVERY DAY EXCEPT ON tuesdays take ONE AND ONE-HALF tablets 08/24/17   Tresa Garter, MD    Family History Family History  Problem Relation Age of Onset  . Aneurysm Brother 46  . Aneurysm Paternal Aunt 57       Died   . Coronary artery disease Sister   . Heart disease Mother   . Hyperlipidemia Mother   . Hypertension Mother   . Heart disease Maternal Grandmother   . Pulmonary embolism Brother     Social History Social History   Tobacco Use  . Smoking status: Current Every Day Smoker    Packs/day: 0.50    Years: 31.00    Pack years: 15.50    Types: Cigarettes  . Smokeless tobacco: Never Used  . Tobacco comment: TO LET DOCTOR KNOW WHEN READY TO QUIT.  Substance Use Topics  . Alcohol use: Yes    Comment: 2 wine coolers per weekend   . Drug use: Yes    Frequency: 7.0 times per week    Types: Marijuana     Allergies   Patient has no known allergies.   Review of Systems Review of Systems   Physical Exam Triage Vital Signs ED Triage Vitals [03/13/18 1151]  Enc Vitals Group     BP 107/65     Pulse Rate 85     Resp 18     Temp 98.2 F (36.8 C)     Temp Source Oral     SpO2 99 %     Weight      Height      Head Circumference      Peak Flow      Pain Score      Pain Loc      Pain Edu?      Excl. in [place]?    No data found.  Updated Vital Signs BP 107/65 (BP Location: Left Arm)   Pulse 85   Temp 98.2 F (36.8 C) (Oral)   Resp 18   LMP 01/26/2004   SpO2 99%    Physical Exam  Constitutional: She is oriented to person, place, and time. She appears well-developed and well-nourished. No distress.  Cardiovascular: Normal rate, regular rhythm and normal heart sounds.  Pulmonary/Chest: Effort normal and breath sounds normal.  Abdominal: Soft. There is no tenderness.  Genitourinary:  Genitourinary Comments: Right labia with draining bartholin gland, white drainage noted  with mild odor; vaginal cytology collected   Neurological: She is alert and oriented to person, place, and time.  Skin: Skin is warm and dry.     UC Treatments / Results  Labs (all labs ordered are listed, but only abnormal results are displayed) Labs Reviewed  POCT URINALYSIS DIP (DEVICE) - Abnormal; Notable for the following components:  Result Value   Protein, ur 30 (*)    Leukocytes, UA SMALL (*)    All other components within normal limits  CERVICOVAGINAL ANCILLARY ONLY    EKG None  Radiology No results found.  Procedures Procedures (including critical care time)  Medications Ordered in UC Medications - No data to display  Initial Impression / Assessment and Plan / UC Course  I have reviewed the triage vital signs and the nursing notes.  Pertinent labs & imaging results that were available during my care of the patient were reviewed by me and considered in my medical decision making (see chart for details).     Urine and history inconsistent with UTI. Exam presents draining bartholin cyst, patient symptomatic with odor drainage and itching. Will cover with antibiotics at this time. Will notify of any positive findings and if any changes to treatment are needed. Patient states she has not been taking coumadin, states last week she took it twice. Does not have much left, does not follow with a PCP. Discussed significance of not taking it as well as taking in inappropriately and risks involved. Encouraged establish with PCP and/or gyne for recheck of bartholin gland. Patient verbalized understanding and agreeable to plan.   Final Clinical Impressions(s) / UC Diagnoses   Final diagnoses:  Acute vaginitis  Bartholin gland cyst     Discharge Instructions     It appears that your bartholin gland is draining, with odor and itching we will treat this as infected at this time.  Will notify you of any positive findings from your vaginal sample and if any changes to  treatment are needed.   Complete course of antibiotics.  Please establish with a primary care provider for recheck and for management of your medicines as it appears that you are to be taking Coumadin and this is a medication that needs to be monitored closely.      ED Prescriptions    Medication Sig Dispense Auth. Provider   doxycycline (VIBRAMYCIN) 100 MG capsule  (Status: Discontinued) Take 1 capsule (100 mg total) by mouth 2 (two) times daily for 7 days. 14 capsule Augusto Gamble B, NP   sulfamethoxazole-trimethoprim (BACTRIM DS) 800-160 MG tablet Take 1 tablet by mouth 2 (two) times daily for 7 days. 14 tablet Zigmund Gottron, NP     Controlled Substance Prescriptions [place] Controlled Substance Registry consulted? Not Applicable   Zigmund Gottron, NP 03/13/18 1310

## 2018-03-14 LAB — CERVICOVAGINAL ANCILLARY ONLY
Bacterial vaginitis: POSITIVE — AB
CANDIDA VAGINITIS: POSITIVE — AB
Chlamydia: NEGATIVE
Neisseria Gonorrhea: NEGATIVE
TRICH (WINDOWPATH): POSITIVE — AB

## 2018-03-18 ENCOUNTER — Telehealth (HOSPITAL_COMMUNITY): Payer: Self-pay

## 2018-03-18 NOTE — Telephone Encounter (Signed)
Bacterial vaginosis is positive. This was not treated at the urgent care visit.  Patient complains of persistent symptoms.  Flagyl 500 mg BID x 7 days #14 no refills sent to patients pharmacy of choice per Dr. Valere Dross.   Pt contacted regarding test for candida (yeast) was positive.  Prescription for fluconazole 150mg  po now, repeat dose in 3d if needed, #2 no refills, sent to the pharmacy of record.     Trichomonas is positive, this will be treated with Flagyl treating BV.  Attempted to reach patient. No answer at this time. Voicemail left.   Will hold Rx, until I speak with patient to give instructions to have INR checked while on medication per [facility].

## 2018-03-21 ENCOUNTER — Telehealth (HOSPITAL_COMMUNITY): Payer: Self-pay

## 2018-03-21 MED ORDER — FLUCONAZOLE 150 MG PO TABS: 150.0000 mg | ORAL_TABLET | Freq: Every day | ORAL | 0 refills | Status: AC

## 2018-03-21 MED ORDER — METRONIDAZOLE 500 MG PO TABS: 500.0000 mg | ORAL_TABLET | Freq: Two times a day (BID) | ORAL | 0 refills | Status: DC

## 2018-03-21 NOTE — Telephone Encounter (Signed)
Attempted to reach patient x2. No answer at this time. Voicemail left.    

## 2018-03-21 NOTE — Telephone Encounter (Signed)
Pt aware of results and new prescriptions. Pt also instructed to call INR team about having INR checked while on Flagyl. Pt verbalized understanding.

## 2018-03-29 NOTE — Progress Notes (Deleted)
Patient ID: Emily Cameron, female   DOB: Jul 26, 1961, 57 y.o.   MRN: [id number] Patient seen in ED 03/13/2018 for vaginal discharge.  Tested positive for trichomonas, yeast,  and BV.

## 2018-03-30 ENCOUNTER — Ambulatory Visit: Payer: Self-pay

## 2018-04-19 ENCOUNTER — Ambulatory Visit: Payer: Medicaid Other | Attending: Internal Medicine | Admitting: Physician Assistant

## 2018-04-19 VITALS — BP 105/64 | HR 69 | Temp 98.4°F | Resp 18 | Ht 63.0 in | Wt 120.0 lb

## 2018-04-19 DIAGNOSIS — Z760 Encounter for issue of repeat prescription: Secondary | ICD-10-CM | POA: Diagnosis not present

## 2018-04-19 DIAGNOSIS — F5102 Adjustment insomnia: Secondary | ICD-10-CM | POA: Insufficient documentation

## 2018-04-19 DIAGNOSIS — G894 Chronic pain syndrome: Secondary | ICD-10-CM | POA: Diagnosis not present

## 2018-04-19 DIAGNOSIS — Z86711 Personal history of pulmonary embolism: Secondary | ICD-10-CM | POA: Insufficient documentation

## 2018-04-19 DIAGNOSIS — J449 Chronic obstructive pulmonary disease, unspecified: Secondary | ICD-10-CM | POA: Insufficient documentation

## 2018-04-19 DIAGNOSIS — E876 Hypokalemia: Secondary | ICD-10-CM | POA: Insufficient documentation

## 2018-04-19 DIAGNOSIS — I2699 Other pulmonary embolism without acute cor pulmonale: Secondary | ICD-10-CM | POA: Diagnosis not present

## 2018-04-19 DIAGNOSIS — F329 Major depressive disorder, single episode, unspecified: Secondary | ICD-10-CM | POA: Insufficient documentation

## 2018-04-19 DIAGNOSIS — F419 Anxiety disorder, unspecified: Secondary | ICD-10-CM | POA: Diagnosis not present

## 2018-04-19 DIAGNOSIS — Z7901 Long term (current) use of anticoagulants: Secondary | ICD-10-CM | POA: Insufficient documentation

## 2018-04-19 DIAGNOSIS — R630 Anorexia: Secondary | ICD-10-CM | POA: Diagnosis not present

## 2018-04-19 DIAGNOSIS — Z79899 Other long term (current) drug therapy: Secondary | ICD-10-CM | POA: Insufficient documentation

## 2018-04-19 DIAGNOSIS — Z87442 Personal history of urinary calculi: Secondary | ICD-10-CM | POA: Diagnosis not present

## 2018-04-19 DIAGNOSIS — Z09 Encounter for follow-up examination after completed treatment for conditions other than malignant neoplasm: Secondary | ICD-10-CM | POA: Insufficient documentation

## 2018-04-19 MED ORDER — QUETIAPINE FUMARATE 100 MG PO TABS: 100.0000 mg | ORAL_TABLET | Freq: Every day | ORAL | 3 refills | Status: DC

## 2018-04-19 MED ORDER — MEGESTROL ACETATE 40 MG/ML PO SUSP: ORAL | 0 refills | Status: DC

## 2018-04-19 MED ORDER — AMITRIPTYLINE HCL 25 MG PO TABS: 25.0000 mg | ORAL_TABLET | Freq: Every day | ORAL | 5 refills | Status: DC

## 2018-04-19 MED ORDER — ARIPIPRAZOLE 5 MG PO TABS: ORAL_TABLET | ORAL | 3 refills | Status: DC

## 2018-04-19 MED ORDER — TRAMADOL HCL 50 MG PO TABS: 50.0000 mg | ORAL_TABLET | Freq: Three times a day (TID) | ORAL | 0 refills | Status: DC | PRN

## 2018-04-19 MED ORDER — WARFARIN SODIUM 5 MG PO TABS
ORAL_TABLET | ORAL | 3 refills | Status: DC
Start: 2018-04-19 — End: 2018-05-11

## 2018-04-19 MED ORDER — MIRTAZAPINE 15 MG PO TABS: 15.0000 mg | ORAL_TABLET | Freq: Every day | ORAL | 3 refills | Status: DC

## 2018-04-19 MED ORDER — POTASSIUM CHLORIDE ER 10 MEQ PO TBCR: 10.0000 meq | EXTENDED_RELEASE_TABLET | Freq: Every day | ORAL | 3 refills | Status: DC

## 2018-04-19 NOTE — Progress Notes (Signed)
Patient ID: Emily Cameron, female   DOB: 07-Nov-1960, 57 y.o.   MRN: [id number]       Emily Cameron, is a 57 y.o. female  [id number]  [id number]  DOB - [birth year]  Subjective:  Chief Complaint and HPI: Emily Cameron is a 57 y.o. female here today for a follow up visit Seen in ED 03/13/2018 and tested positive for trich.  Treated with metronidazole.  Needs RF on meds.  Off of all meds. Poor appetite  Social:  Has financial and housing challenges.   Denies SI/HI but has been struggling w/o meds.  Knows she will feel better if she gets restarted on meds.  Wants to increase dose of seroquel bc of poor sleep. No weapons in home.  No plan or intent to harm herself.    Depression screen [facility] 2/9 04/19/2018 02/02/2017 11/17/2016  Decreased Interest 3 2 0  Down, Depressed, Hopeless 3 2 3   PHQ - 2 Score 6 4 3   Altered sleeping 3 3 3   Tired, decreased energy 0 1 3  Change in appetite 3 3 3   Feeling bad or failure about yourself  3 1 3   Trouble concentrating 3 3 3   Moving slowly or fidgety/restless 0 1 2  Suicidal thoughts 1 1 1   PHQ-9 Score 19 17 21   Some recent data might be hidden    Has been off warfarin too.    ROS:   Constitutional:  No f/c, No night sweats, No unexplained weight loss. EENT:  No vision changes, No blurry vision, No hearing changes. No mouth, throat, or ear problems.  Respiratory: No cough, No SOB Cardiac: No CP, no palpitations GI:  No abd pain, No N/V/D. GU: No Urinary s/sx Musculoskeletal: +chronic pain Neuro: No headache, no dizziness, no motor weakness.  Skin: No rash Endocrine:  No polydipsia. No polyuria.  Psych: Denies SI/HI  No problems updated.  ALLERGIES: No Known Allergies  PAST MEDICAL HISTORY: Past Medical History:  Diagnosis Date  . Alcohol abuse   . Anxiety   . COPD (chronic obstructive pulmonary disease) ([place])   . Depression   . History of kidney stones   . Insomnia   . PE (pulmonary embolism) 2006  . Polysubstance abuse ([place])     Current smoking and alcohol. 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 Marijuna not taking since 7 month.   . Weight loss    due to anxiety    MEDICATIONS AT HOME: Prior to Admission medications   Medication Sig Start Date End Date Taking? Authorizing Provider  amitriptyline (ELAVIL) 25 MG tablet Take 1 tablet (25 mg total) by mouth at bedtime. 04/19/18   Argentina Donovan, PA-C  ARIPiprazole (ABILIFY) 5 MG tablet TAKE ONE TABLET BY MOUTH EVERY DAY *needs office visit FOR furhter refills* 04/19/18   Argentina Donovan, PA-C  megestrol (MEGACE) 40 MG/ML suspension TAKE 10 MLS (2 TEASPOONSFUL) BY MOUTH 2 TIMES A WEEK. 04/19/18   Freeman Caldron M, PA-C  mirtazapine (REMERON) 15 MG tablet Take 1 tablet (15 mg total) by mouth at bedtime. 04/19/18   Argentina Donovan, PA-C  potassium chloride (K-DUR) 10 MEQ tablet Take 1 tablet (10 mEq total) by mouth daily. 04/19/18   Argentina Donovan, PA-C  QUEtiapine (SEROQUEL) 100 MG tablet Take 1 tablet (100 mg total) by mouth at bedtime. 04/19/18   Argentina Donovan, PA-C  Tetrahydrozoline HCl (VISINE OP) Place 2 drops into both eyes as needed (for dry eyes). Reported on 09/09/2015  [provider]  traMADol (ULTRAM) 50 MG tablet Take 1 tablet (50 mg total) by mouth every 8 (eight) hours as needed. 04/19/18   Argentina Donovan, PA-C  warfarin (COUMADIN) 5 MG tablet TAKE ONE TABLET BY MOUTH EVERY DAY EXCEPT ON tuesdays take ONE AND ONE-HALF tablets 04/19/18   Argentina Donovan, PA-C     Objective:  EXAM:   Vitals:   04/19/18 1500  BP: 105/64  Pulse: 69  Resp: 18  Temp: 98.4 F (36.9 C)  TempSrc: Oral  SpO2: 98%  Weight: 120 lb (54.4 kg)  Height: 5\' 3"  (1.6 m)    General appearance : A&OX3. NAD.  Thin  Non-toxic-appearing HEENT: Atraumatic and Normocephalic.  PERRLA. EOM intact.  TM clear B. Mouth-MMM, post pharynx WNL w/o erythema, No PND. Neck: supple, no JVD. No cervical lymphadenopathy. No thyromegaly Chest/Lungs:   Breathing-non-labored, Good air entry bilaterally, breath sounds normal without rales, rhonchi, or wheezing  CVS: S1 S2 regular, no murmurs, gallops, rubs  Extremities: Bilateral Lower Ext shows no edema, both legs are warm to touch with = pulse throughout Neurology:  CN II-XII grossly intact, Non focal.   Psych:  TP linear. J/I WNL. Normal speech. Appropriate eye contact and affect.  Skin:  No Rash  Data Review No results found for: HGBA1C   Assessment & Plan   1. Chronic pain syndrome - amitriptyline (ELAVIL) 25 MG tablet; Take 1 tablet (25 mg total) by mouth at bedtime.  Dispense: 30 tablet; Refill: 5 - traMADol (ULTRAM) 50 MG tablet; Take 1 tablet (50 mg total) by mouth every 8 (eight) hours as needed.  Dispense: 30 tablet; Refill: 0  2. Adjustment insomnia - amitriptyline (ELAVIL) 25 MG tablet; Take 1 tablet (25 mg total) by mouth at bedtime.  Dispense: 30 tablet; Refill: 5 - QUEtiapine (SEROQUEL) 100 MG tablet; Take 1 tablet (100 mg total) by mouth at bedtime.  Dispense: 30 tablet; Refill: 3  3. Other pulmonary embolism without acute cor pulmonale, unspecified chronicity (HCC) - warfarin (COUMADIN) 5 MG tablet; TAKE ONE TABLET BY MOUTH EVERY DAY EXCEPT ON tuesdays take ONE AND ONE-HALF tablets  Dispense: 30 tablet; Refill: 3  4. Loss of appetite - megestrol (MEGACE) 40 MG/ML suspension; TAKE 10 MLS (2 TEASPOONSFUL) BY MOUTH 2 TIMES A WEEK.  Dispense: 80 mL; Refill: 0  5. Major depressive disorder, remission status unspecified, unspecified whether recurrent Stable.  Will have patient f/up with Christa See.  No active SI/plan/intent.  - ARIPiprazole (ABILIFY) 5 MG tablet; TAKE ONE TABLET BY MOUTH EVERY DAY *needs office visit FOR furhter refills*  Dispense: 30 tablet; Refill: 3 - mirtazapine (REMERON) 15 MG tablet; Take 1 tablet (15 mg total) by mouth at bedtime.  Dispense: 30 tablet; Refill: 3  6. Hypokalemia - potassium chloride (K-DUR) 10 MEQ tablet; Take 1 tablet (10 mEq  total) by mouth daily.  Dispense: 30 tablet; Refill: 3  RF'd meds as Dr Doreene [place] had filled but new PCP will need to decide what we will/won't prescribe here on an ongoing basis.  Today I refilled these as to get her stabilized.     Patient have been counseled extensively about nutrition and exercise  Return in about 1 week (around 04/26/2018) for needs INR appt with Lurena Joiner in 1 week; assign PCP 3 weeks.  The patient was given clear instructions to go to ER or return to medical center if symptoms don't improve, worsen or new problems develop. The patient verbalized understanding. The patient was told to call  to get lab results if they haven't heard anything in the next week.     Freeman Caldron, PA-C [facility], [place]   04/19/2018, 3:15 PM

## 2018-04-26 ENCOUNTER — Encounter: Payer: Self-pay | Admitting: Pharmacist

## 2018-05-01 ENCOUNTER — Ambulatory Visit: Payer: Medicaid Other | Attending: Internal Medicine | Admitting: Pharmacist

## 2018-05-01 DIAGNOSIS — G894 Chronic pain syndrome: Secondary | ICD-10-CM | POA: Insufficient documentation

## 2018-05-01 DIAGNOSIS — I2609 Other pulmonary embolism with acute cor pulmonale: Secondary | ICD-10-CM | POA: Diagnosis not present

## 2018-05-01 DIAGNOSIS — I748 Embolism and thrombosis of other arteries: Secondary | ICD-10-CM | POA: Insufficient documentation

## 2018-05-01 DIAGNOSIS — I2699 Other pulmonary embolism without acute cor pulmonale: Secondary | ICD-10-CM

## 2018-05-01 DIAGNOSIS — Z7901 Long term (current) use of anticoagulants: Secondary | ICD-10-CM | POA: Diagnosis present

## 2018-05-01 DIAGNOSIS — Z5181 Encounter for therapeutic drug level monitoring: Secondary | ICD-10-CM

## 2018-05-01 LAB — POCT INR: INR: 1 — AB (ref 2.0–3.0)

## 2018-05-05 ENCOUNTER — Telehealth: Payer: Self-pay | Admitting: Licensed Clinical Social Worker

## 2018-05-05 NOTE — Telephone Encounter (Signed)
LCSWA placed call to patient to follow up on behavioral health screens from previous appointment. Pt stated that she is doing well and agreed to meet with LCSWA at her upcoming visit scheduled for 05/11/18.

## 2018-05-11 ENCOUNTER — Ambulatory Visit: Payer: Medicaid Other | Attending: Family Medicine | Admitting: Family Medicine

## 2018-05-11 ENCOUNTER — Telehealth: Payer: Self-pay | Admitting: Internal Medicine

## 2018-05-11 ENCOUNTER — Ambulatory Visit: Payer: Medicaid Other | Admitting: Pharmacist

## 2018-05-11 DIAGNOSIS — Z87442 Personal history of urinary calculi: Secondary | ICD-10-CM | POA: Diagnosis not present

## 2018-05-11 DIAGNOSIS — K219 Gastro-esophageal reflux disease without esophagitis: Secondary | ICD-10-CM

## 2018-05-11 DIAGNOSIS — Z9119 Patient's noncompliance with other medical treatment and regimen: Secondary | ICD-10-CM | POA: Diagnosis not present

## 2018-05-11 DIAGNOSIS — G894 Chronic pain syndrome: Secondary | ICD-10-CM | POA: Diagnosis not present

## 2018-05-11 DIAGNOSIS — Z7901 Long term (current) use of anticoagulants: Secondary | ICD-10-CM | POA: Diagnosis not present

## 2018-05-11 DIAGNOSIS — Z86711 Personal history of pulmonary embolism: Secondary | ICD-10-CM | POA: Diagnosis not present

## 2018-05-11 DIAGNOSIS — Z9114 Patient's other noncompliance with medication regimen: Secondary | ICD-10-CM

## 2018-05-11 DIAGNOSIS — R0600 Dyspnea, unspecified: Secondary | ICD-10-CM | POA: Diagnosis not present

## 2018-05-11 DIAGNOSIS — J449 Chronic obstructive pulmonary disease, unspecified: Secondary | ICD-10-CM | POA: Insufficient documentation

## 2018-05-11 DIAGNOSIS — Z5181 Encounter for therapeutic drug level monitoring: Secondary | ICD-10-CM

## 2018-05-11 DIAGNOSIS — G4700 Insomnia, unspecified: Secondary | ICD-10-CM | POA: Insufficient documentation

## 2018-05-11 DIAGNOSIS — F329 Major depressive disorder, single episode, unspecified: Secondary | ICD-10-CM | POA: Insufficient documentation

## 2018-05-11 DIAGNOSIS — E876 Hypokalemia: Secondary | ICD-10-CM | POA: Diagnosis not present

## 2018-05-11 DIAGNOSIS — Z91148 Patient's other noncompliance with medication regimen for other reason: Secondary | ICD-10-CM

## 2018-05-11 DIAGNOSIS — R42 Dizziness and giddiness: Secondary | ICD-10-CM | POA: Diagnosis not present

## 2018-05-11 DIAGNOSIS — Z8249 Family history of ischemic heart disease and other diseases of the circulatory system: Secondary | ICD-10-CM | POA: Insufficient documentation

## 2018-05-11 DIAGNOSIS — F419 Anxiety disorder, unspecified: Secondary | ICD-10-CM | POA: Insufficient documentation

## 2018-05-11 DIAGNOSIS — F17210 Nicotine dependence, cigarettes, uncomplicated: Secondary | ICD-10-CM | POA: Diagnosis not present

## 2018-05-11 DIAGNOSIS — I2699 Other pulmonary embolism without acute cor pulmonale: Secondary | ICD-10-CM

## 2018-05-11 DIAGNOSIS — R630 Anorexia: Secondary | ICD-10-CM | POA: Diagnosis not present

## 2018-05-11 LAB — POCT INR: INR: 1.4 — AB (ref 2.0–3.0)

## 2018-05-11 MED ORDER — TRAMADOL HCL 50 MG PO TABS: 50.0000 mg | ORAL_TABLET | Freq: Three times a day (TID) | ORAL | 0 refills | Status: DC | PRN

## 2018-05-11 MED ORDER — FAMOTIDINE 20 MG PO TABS: 20.0000 mg | ORAL_TABLET | Freq: Every day | ORAL | 11 refills | Status: DC

## 2018-05-11 MED ORDER — WARFARIN SODIUM 5 MG PO TABS: 5.0000 mg | ORAL_TABLET | Freq: Every day | ORAL | 3 refills | Status: DC

## 2018-05-11 NOTE — Progress Notes (Signed)
Subjective:    Patient ID: Emily Cameron, female    DOB: [birth year], 57 y.o.   MRN: [id number]  HPI 57 yo female who is seen in follow-up of recent with another provider here due to complaint of loss of appetite for which she was placed on Megace.  Patient thinks that her appetite is better.  Patient states that sometimes she just does not feel like eating.  Patient saw the clinical pharmacist prior to her visit with me.  Patient states that she now understands that she should take her Coumadin as directed.  Patient states that she has been taking her Coumadin just when she felt as if she needed it.  Patient denies any unusual bruising.  Patient states that she has not been taking her acid reflux medicine but does have symptoms of acid reflux.  Patient also states that she has not been taking her potassium pills as they were just too big.  Reports that she has been having some dizziness.  Patient also reports that she did have some pain in her right ankle/lower leg last week and patient states that she thought that she was going to have to go to the emergency department due to her pain however the pain went away.  Patient denies any current pain at today's visit.  Patient reports that she does sometimes feel as if she is short of breath.  Patient has history of pulmonary embolism.  Patient states that the shortness of breath tends to occur with activity.  Patient denies any swelling in her legs.  Patient denies cough or productive sputum.  Patient reports that she does receive mental health follow-up and has a therapist come to her home twice a week. Past Medical History:  Diagnosis Date  . Alcohol abuse   . Anxiety   . COPD (chronic obstructive pulmonary disease) ([place])   . Depression   . History of kidney stones   . Insomnia   . PE (pulmonary embolism) 2006  . Polysubstance abuse ([place])    Current smoking and alcohol. 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 Marijuna not taking since 7  month.   . Weight loss    due to anxiety   Family History  Problem Relation Age of Onset  . Aneurysm Brother 64  . Aneurysm Paternal Aunt 73       Died   . Coronary artery disease Sister   . Heart disease Mother   . Hyperlipidemia Mother   . Hypertension Mother   . Heart disease Maternal Grandmother   . Pulmonary embolism Brother    Social History   Tobacco Use  . Smoking status: Current Every Day Smoker    Packs/day: 0.50    Years: 31.00    Pack years: 15.50    Types: Cigarettes  . Smokeless tobacco: Never Used  . Tobacco comment: TO LET DOCTOR KNOW WHEN READY TO QUIT.  Substance Use Topics  . Alcohol use: Yes    Comment: 2 wine coolers per weekend   . Drug use: Yes    Frequency: 7.0 times per week    Types: Marijuana  No Known Allergies     Review of Systems  Constitutional: Positive for fatigue. Negative for chills and fever.  Respiratory: Positive for shortness of breath. Negative for cough.   Cardiovascular: Negative for chest pain, palpitations and leg swelling.  Gastrointestinal: Positive for nausea. Negative for abdominal pain.  Genitourinary: Negative for dysuria and flank pain.  Musculoskeletal: Positive  for arthralgias. Negative for back pain, gait problem and joint swelling.  Neurological: Positive for dizziness. Negative for weakness and headaches.  Psychiatric/Behavioral: Negative for self-injury and suicidal ideas. The patient is nervous/anxious.        Objective:   Physical Exam LMP 01/26/2004  General-well-nourished, female in no acute distress Neck-supple, no lymphadenopathy, no thyromegaly no JVD EENT- conjunctiva normal, extraocular movements intact.  TMs gray, nares with mild edema, normal oropharynx Lungs-clear to auscultation bilaterally, breathing is nonlabored, no accessory muscle use.  Patient is able to carry on normal conversation without indication of shortness of breath  Cardiovascular-regular rate and rhythm Abdomen-soft,  nontender Back-no CVA tenderness, no lumbosacral discomfort to palpation Extremities-no edema Psych- patient with slightly odd affect but interacts appropriately Neuro- cranial nerves II through XII are grossly intact     Assessment & Plan:  1. Dizziness Patient has had a history of electrolyte abnormalities but is noncompliant with medications.  Patient is also on multiple psychiatric medications which may cause issues with dizziness.  Patient will have CMP and CBC in follow-up of high-risk medication use as well as complaint of dizziness - Comprehensive metabolic panel - CBC with Differential  2. Hypokalemia Patient with history of hypokalemia but has not been taking her potassium supplement.  Patient will have CMP to make sure that her potassium level does not require additional intervention  3. Dyspnea, unspecified type Patient with complaint of some shortness of breath with activity.  Patient has had history of pulmonary embolism and may have some decreased diffusion capacity of the lungs.  Patient however at today's visit does not appear to have any shortness of breath, no cough and had normal lung exam.  Order was placed for patient to have chest x-ray done in follow-up of her complaint of shortness of breath - DG Chest 2 View; Future  4. Loss of appetite Patient with complaint of loss of appetite and was recently placed on Megace.  Patient however with pulmonary embolism and also has family history of sibling with pulmonary embolism.  Patient also does not take her Coumadin as prescribed.  Megace can cause increased risk of blood clots therefore this medication will not be refilled.  Patient is on Remeron as 1 of her psychiatric medications and this medication can increase the appetite.  Patient will have CMP and TSH in follow-up of her complaint of loss of appetite - Comprehensive metabolic panel - TSH  5. Gastroesophageal reflux disease, esophagitis presence not specified Patient  with complaint of some reflux symptoms but is not taking her medication.  Patient is asked to take her Pepcid due to her long-term use of anticoagulation which unfortunately she also does not take as prescribed.  Patient CBC to look for any blood loss. - CBC with Differential  6. Encounter for current long-term use of anticoagulants Patient will have CBC in follow-up of her long-term use of anticoagulants and patient was seen earlier today by the clinical pharmacist and her INR is subtherapeutic at this time.  Patient states that she takes her Coumadin when she feels that she needs the medication.  Patient states that after talking with the clinical pharmacist, she now understands to take the medication daily.  Patient was also discussed with the clinical pharmacist after her appointment and her noncompliance has been a long-standing issue. - CBC with Differential  7.  Chronic pain syndrome I was informed by CMA after patient had left that patient had requested refill of tramadol.  During her appointment, patient  had only mentioned episode of right lower leg pain which has resolved.  Apparently patient has been on tramadol long-term and expects to have this medication filled regardless.  Unfortunately I was unable to speak with patient regarding this issue.  Short-term supply has been sent to her pharmacy but this will need to be addressed at her next visit.  8.  Noncompliance with medication treatment due to intermittent use of medication Per clinical pharmacist and other staff members here, patient does have a long history of noncompliance with medications.  Patient also with history of mental illness which may be a contributing factor to her lack of compliance.  Patient states that she is now regularly having a therapist come to her home twice per week.  I will discuss patient with the social worker here in the office the social worker can reach out to patient's therapist to see if there always to  increase the patient's compliance with medications.  * Influenza immunization offered at today's visit which patient declined  **After visit, medical assistant expressed that patient wanted to know about her prescription for tramadol as she was expecting a refill. Patient has not mentioned a need for refill of this medication and had stated during her visit that she had an episode of pain in her right lower leg but the pain went away. Patient had gotten a prescription for tramadol sent into her pharmacy on 04/19/18. I will send in a RX with orders that it not be filled until 05/20/18.  Return in about 2 months (around 07/11/2018) for 1 week with LUKE/INR.

## 2018-05-11 NOTE — Addendum Note (Signed)
Addended by: Daisy Blossom, Annie Main L on: 05/11/2018 02:46 PM   Modules accepted: Orders

## 2018-05-11 NOTE — Telephone Encounter (Signed)
Patient called stating she wants all her medications sent to [facility] on [address], I explained to her that she can contact the pharmacy she would like her medications filled and they can request them from the wrong one. -She wants an update on her traMADol 50 MG,  please follow up as soon as possible

## 2018-05-12 ENCOUNTER — Other Ambulatory Visit: Payer: Self-pay | Admitting: Physician Assistant

## 2018-05-12 DIAGNOSIS — G894 Chronic pain syndrome: Secondary | ICD-10-CM

## 2018-05-12 LAB — COMPREHENSIVE METABOLIC PANEL WITH GFR
ALT: 15 IU/L (ref 0–32)
AST: 19 IU/L (ref 0–40)
Albumin/Globulin Ratio: 1.4 (ref 1.2–2.2)
Albumin: 4.2 g/dL (ref 3.5–5.5)
Alkaline Phosphatase: 74 IU/L (ref 39–117)
BUN/Creatinine Ratio: 14 (ref 9–23)
BUN: 10 mg/dL (ref 6–24)
Bilirubin Total: <0.2 mg/dL (ref 0.0–1.2)
CO2: 25 mmol/L (ref 20–29)
Calcium: 9.4 mg/dL (ref 8.7–10.2)
Chloride: 105 mmol/L (ref 96–106)
Creatinine, Ser: 0.71 mg/dL (ref 0.57–1.00)
GFR calc Af Amer: 112 mL/min/1.73
GFR calc non Af Amer: 97 mL/min/1.73
Globulin, Total: 2.9 g/dL (ref 1.5–4.5)
Glucose: 56 mg/dL — ABNORMAL LOW (ref 65–99)
Potassium: 4.3 mmol/L (ref 3.5–5.2)
Sodium: 142 mmol/L (ref 134–144)
Total Protein: 7.1 g/dL (ref 6.0–8.5)

## 2018-05-12 LAB — CBC WITH DIFFERENTIAL/PLATELET
Basophils Absolute: 0.1 x10E3/uL (ref 0.0–0.2)
Basos: 1 %
EOS (ABSOLUTE): 0.1 x10E3/uL (ref 0.0–0.4)
Eos: 1 %
Hematocrit: 39.3 % (ref 34.0–46.6)
Hemoglobin: 12.9 g/dL (ref 11.1–15.9)
Immature Grans (Abs): 0.1 x10E3/uL (ref 0.0–0.1)
Immature Granulocytes: 1 %
Lymphocytes Absolute: 3.8 x10E3/uL — ABNORMAL HIGH (ref 0.7–3.1)
Lymphs: 39 %
MCH: 30.6 pg (ref 26.6–33.0)
MCHC: 32.8 g/dL (ref 31.5–35.7)
MCV: 93 fL (ref 79–97)
Monocytes Absolute: 0.8 x10E3/uL (ref 0.1–0.9)
Monocytes: 8 %
Neutrophils Absolute: 4.9 x10E3/uL (ref 1.4–7.0)
Neutrophils: 50 %
Platelets: 210 x10E3/uL (ref 150–450)
RBC: 4.21 x10E6/uL (ref 3.77–5.28)
RDW: 14.6 % (ref 12.3–15.4)
WBC: 9.6 x10E3/uL (ref 3.4–10.8)

## 2018-05-12 LAB — TSH: TSH: 0.653 u[IU]/mL (ref 0.450–4.500)

## 2018-05-16 ENCOUNTER — Telehealth: Payer: Self-pay

## 2018-05-16 NOTE — Telephone Encounter (Signed)
Contacted pt to go over lab results pt didn't answer and was unable to lvm  If pt calls back please give results: on the day of her blood work, her blood low at 56 but her complete metabolic panel was otherwise normal. Patient also with a normal complete blood count and normal TSH

## 2018-05-18 ENCOUNTER — Ambulatory Visit: Payer: Medicaid Other | Attending: Family Medicine | Admitting: Pharmacist

## 2018-05-18 ENCOUNTER — Encounter: Payer: Self-pay | Admitting: Pharmacist

## 2018-05-18 DIAGNOSIS — I2699 Other pulmonary embolism without acute cor pulmonale: Secondary | ICD-10-CM | POA: Diagnosis not present

## 2018-05-18 DIAGNOSIS — Z7901 Long term (current) use of anticoagulants: Secondary | ICD-10-CM

## 2018-05-18 DIAGNOSIS — Z5181 Encounter for therapeutic drug level monitoring: Secondary | ICD-10-CM | POA: Diagnosis present

## 2018-05-18 LAB — POCT INR: INR: 3.5 — AB (ref 2.0–3.0)

## 2018-05-18 NOTE — Patient Instructions (Signed)
Thank you for coming to see me today.   Start taking 1 tablet every day except 1/2 tablet on Wednesdays.   I will see you in 2 weeks.

## 2018-05-18 NOTE — Progress Notes (Signed)
    Pharmacy Anticoagulation Clinic  Subjective: Patient presents today for INR monitoring. Anticoagulation indication is : other pulmonary embolism without acute cor pulmonale ([place]) [I26.99], Monitoring for long-term anticoagulant use [Z51.81 Z79.01].   Current dose of warfarin: 5 mg daily  Adherence to warfarin: yes Signs/symptoms of bleeding: none Recent changes in diet: none Recent changes in medications: none Upcoming procedures that may impact anticoagulation: none   Objective: Today's INR = 3.5  Lab Results  Component Value Date   INR 1.4 (A) 05/11/2018   INR 1.0 (A) 05/01/2018   INR 1.03 10/17/2017     Assessment and Plan: Anticoagulation: Patient is Supratherapeutic based on patient's INR of 3.5 and patient's INR goal of 2-3. Will Decrease current warfarin dose:  - 1 tablet every day except 1/2 tab on Wednesdays.   Patient verbalized understanding and was provided with written instructions. Next INR check planned for 2 weeks.   Benard Halsted, PharmD, [place] [phone]

## 2018-05-20 ENCOUNTER — Other Ambulatory Visit: Payer: Self-pay | Admitting: Physician Assistant

## 2018-05-20 DIAGNOSIS — G894 Chronic pain syndrome: Secondary | ICD-10-CM

## 2018-05-22 ENCOUNTER — Telehealth: Payer: Self-pay | Admitting: Family Medicine

## 2018-05-22 NOTE — Telephone Encounter (Signed)
Pt called to request a medication refilled   -traMADol (ULTRAM) 50 MG tablet  Wants to know if she can have the quantity changed from 30 tablets to 60, please follow up

## 2018-05-22 NOTE — Telephone Encounter (Signed)
Please contact patient and ask if she would like a referral to a pain management specialist. I am not increasing the dose of her tramadol at this time. She may want to make an appointment with her PCP here/schedule office visit to discuss her issues with pain

## 2018-05-23 ENCOUNTER — Other Ambulatory Visit: Payer: Self-pay | Admitting: Physician Assistant

## 2018-05-23 DIAGNOSIS — R630 Anorexia: Secondary | ICD-10-CM

## 2018-05-23 NOTE — Telephone Encounter (Signed)
Please attempt to call patient

## 2018-05-24 ENCOUNTER — Telehealth: Payer: Self-pay | Admitting: Family Medicine

## 2018-05-24 NOTE — Telephone Encounter (Signed)
Pt called to speak with her nurse, please call patient back to discus her medication -megestrol (MEGACE) 40 MG/ML suspension  Please follow up

## 2018-05-24 NOTE — Telephone Encounter (Signed)
MA unable to reach the patient.  Please ask patient if she would like to be referred to a pain clinic to address chronic pain. Tramadol will not be increased at this time by PCP.

## 2018-05-24 NOTE — Telephone Encounter (Signed)
I believe that this patient has a history of either a DVT or PE and Megace can increase the risk of blood clots so I am not refilling this medication

## 2018-05-25 ENCOUNTER — Other Ambulatory Visit: Payer: Self-pay | Admitting: Physician Assistant

## 2018-05-25 DIAGNOSIS — R630 Anorexia: Secondary | ICD-10-CM

## 2018-05-25 NOTE — Telephone Encounter (Signed)
Please call her again today and not can be closed if she cannot be reached

## 2018-05-26 NOTE — Telephone Encounter (Signed)
Patient was called, no answer, lvm to return call. 

## 2018-05-31 ENCOUNTER — Encounter: Payer: Self-pay | Admitting: Pharmacist

## 2018-06-02 ENCOUNTER — Ambulatory Visit: Payer: Medicaid Other | Attending: Family Medicine | Admitting: Pharmacist

## 2018-06-02 DIAGNOSIS — I2699 Other pulmonary embolism without acute cor pulmonale: Secondary | ICD-10-CM | POA: Diagnosis present

## 2018-06-02 DIAGNOSIS — Z7901 Long term (current) use of anticoagulants: Secondary | ICD-10-CM | POA: Diagnosis present

## 2018-06-02 DIAGNOSIS — Z5181 Encounter for therapeutic drug level monitoring: Secondary | ICD-10-CM | POA: Insufficient documentation

## 2018-06-02 LAB — POCT INR: INR: 1.5 — AB (ref 2.0–3.0)

## 2018-06-02 NOTE — Progress Notes (Signed)
Created in error

## 2018-06-02 NOTE — Telephone Encounter (Signed)
Patient was seen today in office and wants to get more prescription quantity pills. Patient was informed if she would like to be referred to a pain clinic per the notes in the telephone call and patient declined and was upset. Please follow up.

## 2018-06-06 ENCOUNTER — Other Ambulatory Visit: Payer: Self-pay | Admitting: Family Medicine

## 2018-06-06 ENCOUNTER — Telehealth: Payer: Self-pay | Admitting: Family Medicine

## 2018-06-06 DIAGNOSIS — G894 Chronic pain syndrome: Secondary | ICD-10-CM

## 2018-06-06 MED ORDER — TRAMADOL HCL 50 MG PO TABS: 50.0000 mg | ORAL_TABLET | Freq: Three times a day (TID) | ORAL | 0 refills | Status: DC | PRN

## 2018-06-06 NOTE — Progress Notes (Signed)
Patient ID: Emily Cameron, female   DOB: 05-10-1961, 57 y.o.   MRN: [id number]   See phone encounter- patient called to request a refill of tramadol and also agreed to be referred to pain management for her chronic pain issues. PMP Aware drug registry checked and no refills from providers outside of this practice. Last RX filled on 05/20/18 and written on 05/07/18.

## 2018-06-06 NOTE — Telephone Encounter (Signed)
Please notify patient that a prescription for tramadol was sent to this pharmacy and a referral was made for pain management

## 2018-06-06 NOTE — Telephone Encounter (Signed)
Pt called to request a medication refill on -traMADol (ULTRAM) 50 MG tablet  To [facility] pharmacy. Please follow up and she also agreed to a referral to a pain clinic, please follow up

## 2018-06-07 MED FILL — traMADol HCL 50 MG TABS: 50 | 7 days supply | Qty: 21 | Fill #0

## 2018-06-07 NOTE — Telephone Encounter (Signed)
Patient is aware of script being filled at [facility] and patient referred to pain management for future refills.

## 2018-06-09 ENCOUNTER — Ambulatory Visit: Payer: Self-pay | Admitting: Pharmacist

## 2018-06-09 ENCOUNTER — Ambulatory Visit: Payer: Medicaid Other | Attending: Family Medicine | Admitting: Pharmacist

## 2018-06-09 DIAGNOSIS — Z5181 Encounter for therapeutic drug level monitoring: Secondary | ICD-10-CM | POA: Insufficient documentation

## 2018-06-09 DIAGNOSIS — Z7901 Long term (current) use of anticoagulants: Secondary | ICD-10-CM | POA: Diagnosis not present

## 2018-06-09 DIAGNOSIS — I2699 Other pulmonary embolism without acute cor pulmonale: Secondary | ICD-10-CM | POA: Diagnosis present

## 2018-06-09 LAB — POCT INR: INR: 2.2 (ref 2.0–3.0)

## 2018-06-12 NOTE — Telephone Encounter (Signed)
Called and scheduled patient to check INR tomorrow. Do you want me to collect a CBC as well?

## 2018-06-12 NOTE — Telephone Encounter (Signed)
You might want to have her come in this week to recheck her INR because she has stated to me that she "takes her medications when she feels that she needs them" so she may have taken more of her anticoagulant that she should have

## 2018-06-12 NOTE — Telephone Encounter (Signed)
Patient call received today. Pt reports darkening of stools with 1 incident of bright red blood over the weekend. Pt is anticoagulated with warfarin. Last INR 2.2 obtained 06/09/18. She has not noticed it today. Denies local symptoms. No abdominal pain or cramping. I informed her to monitor today to see if this resolves or worsens. If she sees more, she knows to call and make an appointment to see me or her doctor. Also left instructions that if she experiences worsening of bleeding or additional symptoms to report to the ER.

## 2018-06-12 NOTE — Telephone Encounter (Signed)
Yes, that sounds like a good idea with her current reported symptoms

## 2018-06-13 ENCOUNTER — Encounter: Payer: Self-pay | Admitting: Pharmacist

## 2018-06-14 ENCOUNTER — Encounter: Payer: Self-pay | Admitting: Pharmacist

## 2018-06-15 ENCOUNTER — Ambulatory Visit: Payer: Medicaid Other | Attending: Family Medicine | Admitting: Pharmacist

## 2018-06-15 DIAGNOSIS — I2699 Other pulmonary embolism without acute cor pulmonale: Secondary | ICD-10-CM | POA: Diagnosis not present

## 2018-06-15 DIAGNOSIS — Z7901 Long term (current) use of anticoagulants: Secondary | ICD-10-CM | POA: Insufficient documentation

## 2018-06-15 DIAGNOSIS — Z5181 Encounter for therapeutic drug level monitoring: Secondary | ICD-10-CM | POA: Diagnosis present

## 2018-06-15 LAB — POCT INR: INR: 2.6 (ref 2.0–3.0)

## 2018-06-16 LAB — CBC WITH DIFFERENTIAL/PLATELET
Basophils Absolute: 0 x10E3/uL (ref 0.0–0.2)
Basos: 0 %
EOS (ABSOLUTE): 0.1 x10E3/uL (ref 0.0–0.4)
Eos: 2 %
Hematocrit: 36.6 % (ref 34.0–46.6)
Hemoglobin: 12.3 g/dL (ref 11.1–15.9)
Immature Grans (Abs): 0 x10E3/uL (ref 0.0–0.1)
Immature Granulocytes: 0 %
Lymphocytes Absolute: 3 x10E3/uL (ref 0.7–3.1)
Lymphs: 44 %
MCH: 30.1 pg (ref 26.6–33.0)
MCHC: 33.6 g/dL (ref 31.5–35.7)
MCV: 90 fL (ref 79–97)
Monocytes Absolute: 0.5 x10E3/uL (ref 0.1–0.9)
Monocytes: 7 %
Neutrophils Absolute: 3.2 x10E3/uL (ref 1.4–7.0)
Neutrophils: 47 %
Platelets: 204 x10E3/uL (ref 150–450)
RBC: 4.09 x10E6/uL (ref 3.77–5.28)
RDW: 15.1 % (ref 12.3–15.4)
WBC: 6.8 x10E3/uL (ref 3.4–10.8)

## 2018-06-19 ENCOUNTER — Ambulatory Visit: Payer: Self-pay | Admitting: Internal Medicine

## 2018-06-19 ENCOUNTER — Encounter: Payer: Self-pay | Admitting: Pharmacist

## 2018-06-23 ENCOUNTER — Encounter: Payer: Self-pay | Admitting: Pharmacist

## 2018-06-26 ENCOUNTER — Telehealth: Payer: Self-pay

## 2018-06-26 NOTE — Telephone Encounter (Signed)
Patient was called regarding lab results, verified dob, was given results and verbalized understanding and had no further questions.

## 2018-06-27 ENCOUNTER — Ambulatory Visit: Payer: Self-pay | Admitting: Internal Medicine

## 2018-07-04 ENCOUNTER — Ambulatory Visit: Payer: Self-pay | Admitting: Internal Medicine

## 2018-07-11 ENCOUNTER — Ambulatory Visit: Payer: Self-pay | Admitting: Family Medicine

## 2018-07-25 ENCOUNTER — Ambulatory Visit: Payer: Self-pay | Admitting: Internal Medicine

## 2018-09-07 ENCOUNTER — Telehealth: Payer: Self-pay | Admitting: Family Medicine

## 2018-09-07 NOTE — Telephone Encounter (Signed)
Please ask if she has been to pain clinic and if so then find out where as I have not yet received a note from the pain clinic she attended regarding the need for her to go there twice per week

## 2018-09-07 NOTE — Telephone Encounter (Signed)
Patient called to inform that she is unable to go to the pain clinic 2x a week. And would like to continue her care at [facility].  Please follow up.

## 2018-09-08 NOTE — Telephone Encounter (Signed)
Referral notes were printed and placed on your desk. Patient had an appointment 06/13/18

## 2018-09-08 NOTE — Telephone Encounter (Signed)
Are the notes in the mailbox now?

## 2019-02-14 ENCOUNTER — Emergency Department (HOSPITAL_COMMUNITY): Payer: Medicaid Other

## 2019-02-14 ENCOUNTER — Emergency Department (HOSPITAL_COMMUNITY)
Admission: EM | Admit: 2019-02-14 | Discharge: 2019-02-14 | Disposition: A | Discharge status: Home / Self Care | Payer: Medicaid Other | Attending: Emergency Medicine | Admitting: Emergency Medicine

## 2019-02-14 ENCOUNTER — Encounter (HOSPITAL_COMMUNITY): Payer: Self-pay | Admitting: Emergency Medicine

## 2019-02-14 ENCOUNTER — Other Ambulatory Visit: Payer: Self-pay

## 2019-02-14 DIAGNOSIS — S299XXA Unspecified injury of thorax, initial encounter: Secondary | ICD-10-CM | POA: Diagnosis present

## 2019-02-14 DIAGNOSIS — Z79899 Other long term (current) drug therapy: Secondary | ICD-10-CM | POA: Insufficient documentation

## 2019-02-14 DIAGNOSIS — J449 Chronic obstructive pulmonary disease, unspecified: Secondary | ICD-10-CM | POA: Insufficient documentation

## 2019-02-14 DIAGNOSIS — Y929 Unspecified place or not applicable: Secondary | ICD-10-CM | POA: Insufficient documentation

## 2019-02-14 DIAGNOSIS — Y9389 Activity, other specified: Secondary | ICD-10-CM | POA: Diagnosis not present

## 2019-02-14 DIAGNOSIS — S20211A Contusion of right front wall of thorax, initial encounter: Secondary | ICD-10-CM | POA: Diagnosis not present

## 2019-02-14 DIAGNOSIS — Y999 Unspecified external cause status: Secondary | ICD-10-CM | POA: Insufficient documentation

## 2019-02-14 DIAGNOSIS — W51XXXA Accidental striking against or bumped into by another person, initial encounter: Secondary | ICD-10-CM | POA: Insufficient documentation

## 2019-02-14 DIAGNOSIS — S298XXA Other specified injuries of thorax, initial encounter: Secondary | ICD-10-CM

## 2019-02-14 DIAGNOSIS — G894 Chronic pain syndrome: Secondary | ICD-10-CM

## 2019-02-14 DIAGNOSIS — F17210 Nicotine dependence, cigarettes, uncomplicated: Secondary | ICD-10-CM | POA: Insufficient documentation

## 2019-02-14 IMAGING — DX RIGHT RIBS AND CHEST - 3+ VIEW
3 series · 3 of 3 positions shown · non-contrast
Comparison: Chest CTA 10/17/2017 and earlier.

CLINICAL DATA: 57-year-old female status post fall 3 days ago with
anterior lower rib pain.

EXAM:
RIGHT RIBS AND CHEST - 3+ VIEW

[w chest pa]
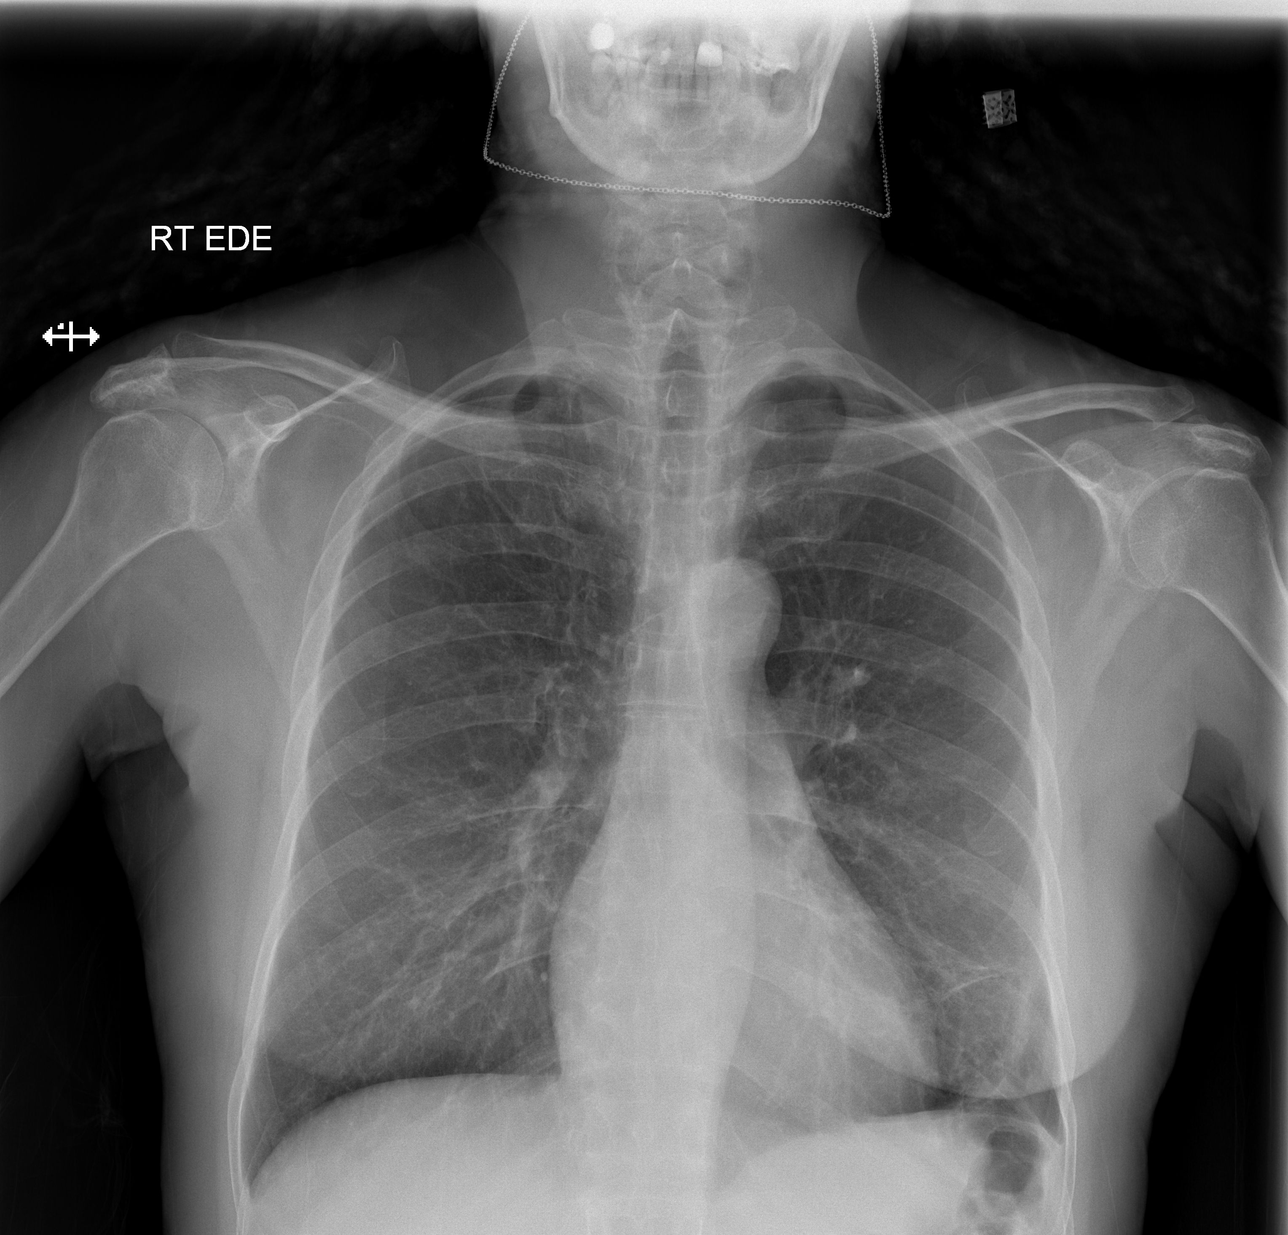

[w ribs ap lower right]
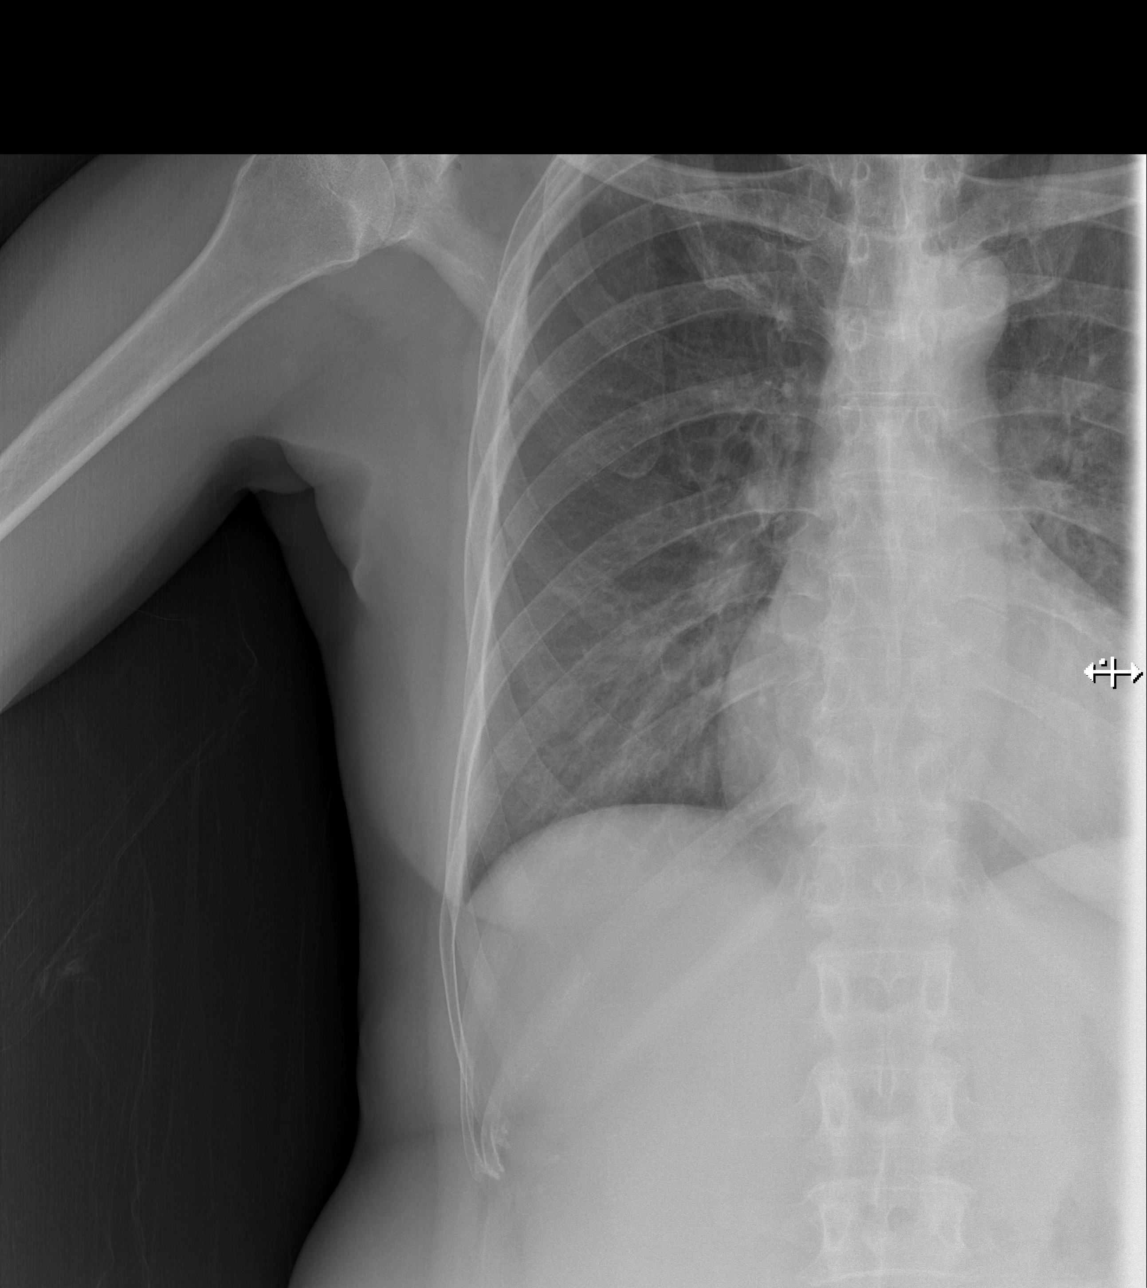

[w ribs obl right]
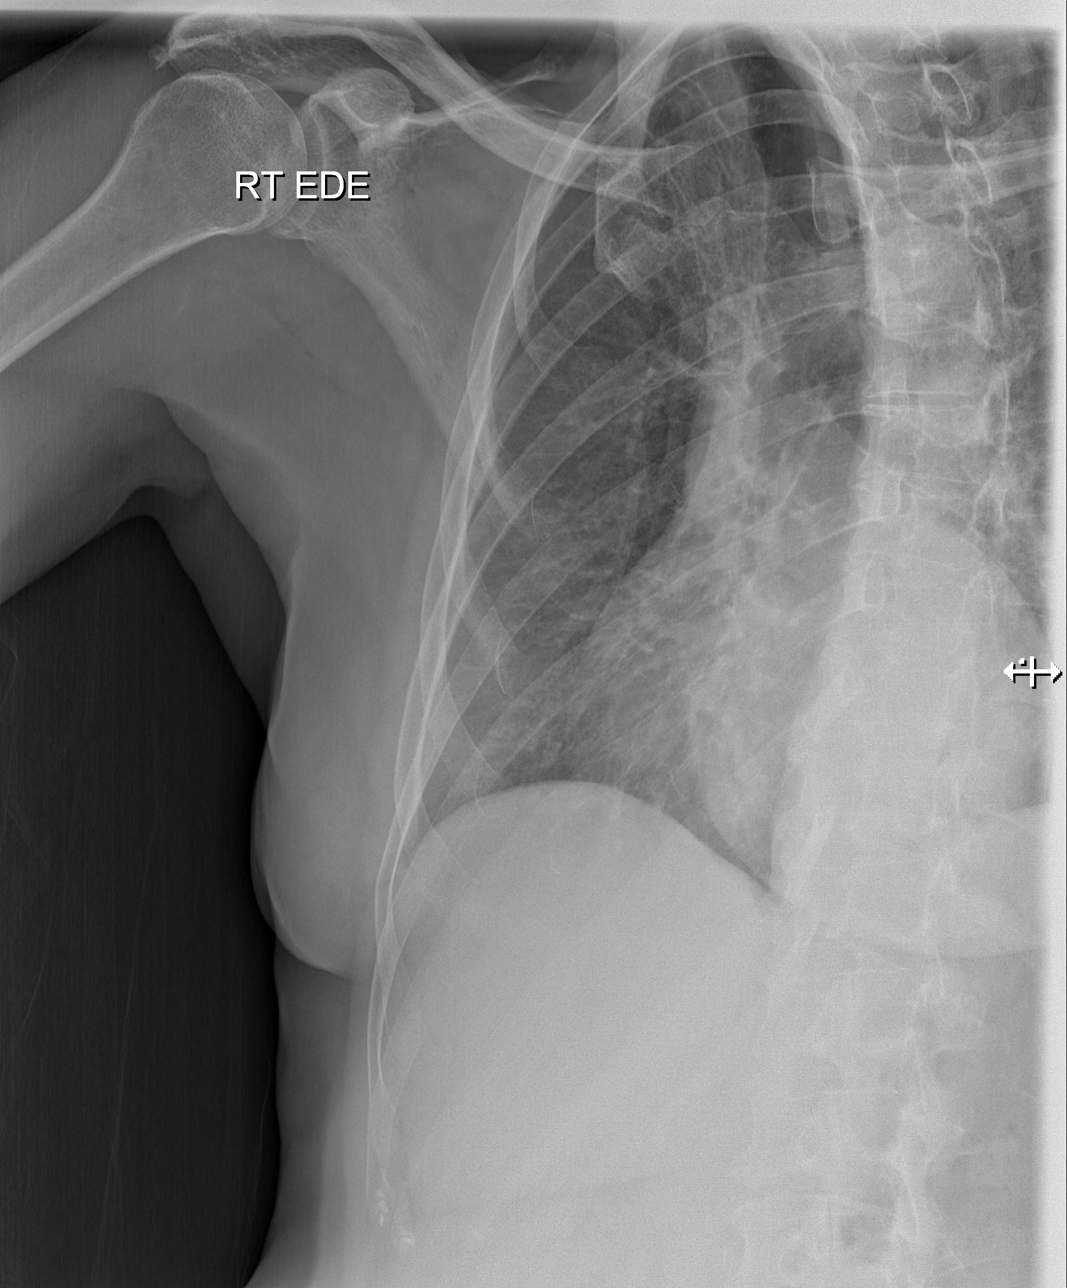

[3 of 3 positions shown; findings below may reference images not displayed]

FINDINGS: Lung volumes remain normal. Normal cardiac size and mediastinal
contours. Visualized tracheal air column is within normal limits.
Linear chronic scarring at the left lung base in the lower lobe is
stable. No pneumothorax, pulmonary edema, pleural effusion or acute
pulmonary opacity identified.

Two oblique views of the right ribs. No right rib fracture
identified. Other visible osseous structures appear intact. Negative
visible bowel gas pattern.
IMPRESSION: 1. No right rib fracture identified.
2. No acute cardiopulmonary abnormality.

## 2019-02-14 NOTE — Discharge Instructions (Addendum)
Use lidocaine 4% Continue to take your prescribed oxycodone for pain. Apply ice to your rib cage. Return if you are having fever,, coughing up blood, severe shortness of breath.

## 2019-02-14 NOTE — ED Triage Notes (Signed)
Pt reports she fell on 02/11/2019 and is having right rib cage pain.
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
## 2019-02-14 NOTE — ED Notes (Signed)
Pt verbalized understanding of discharge paperwork and follow-up care. Incentive spirometry explained and provided for pt.

## 2019-02-14 NOTE — ED Notes (Signed)
Patient transported to X-ray 

## 2019-03-15 ENCOUNTER — Other Ambulatory Visit: Payer: Self-pay

## 2019-03-15 DIAGNOSIS — Z20822 Contact with and (suspected) exposure to covid-19: Secondary | ICD-10-CM

## 2019-03-19 LAB — NOVEL CORONAVIRUS, NAA: SARS-CoV-2, NAA: NOT DETECTED

## 2019-03-22 ENCOUNTER — Telehealth: Payer: Self-pay | Admitting: *Deleted

## 2019-03-22 NOTE — Telephone Encounter (Signed)
Disregard previous result note on 03/22/19 at 1549. Pt did have negative results but also she had symptoms when the test was done but none now. Correction below:  Called pt to give her negative result for covid-19. Pt stated that she had had diarrhea, nausea, weakness in her arms and legs and slight headache when she had the test done. Pt voiced understanding of result.

## 2020-04-14 ENCOUNTER — Ambulatory Visit: Payer: Medicaid Other | Admitting: Obstetrics

## 2020-05-21 ENCOUNTER — Telehealth: Payer: Self-pay | Admitting: Hematology and Oncology

## 2020-05-21 NOTE — Telephone Encounter (Signed)
Received a new hem referal from [facility] for personal hx of pe. Pt has been cld and scheduled to see Dr. Lindi Adie on 10/13 at 945am. Pt aware to arrive 30 minutes early.

## 2020-06-03 ENCOUNTER — Telehealth: Payer: Self-pay | Admitting: Hematology and Oncology

## 2020-06-03 NOTE — Telephone Encounter (Signed)
Pt cld to cancel her new pt appt w/Dr. Lindi Adie on 10/13. She didn't want to reschedule at this time.

## 2020-06-04 ENCOUNTER — Inpatient Hospital Stay: Payer: Medicaid Other | Admitting: Hematology and Oncology

## 2020-06-20 ENCOUNTER — Ambulatory Visit: Payer: Medicaid Other | Admitting: Podiatry

## 2020-07-05 ENCOUNTER — Ambulatory Visit: Payer: Medicaid Other | Admitting: Podiatry

## 2020-07-11 ENCOUNTER — Other Ambulatory Visit: Payer: Self-pay | Admitting: Nurse Practitioner

## 2020-07-11 ENCOUNTER — Ambulatory Visit
Admission: RE | Admit: 2020-07-11 | Discharge: 2020-07-11 | Disposition: A | Discharge status: Home / Self Care | Payer: Medicaid Other | Source: Ambulatory Visit | Attending: Nurse Practitioner | Admitting: Nurse Practitioner

## 2020-07-11 DIAGNOSIS — Z86711 Personal history of pulmonary embolism: Secondary | ICD-10-CM

## 2020-07-11 IMAGING — CT CT ANGIO CHEST
1 of 2 series · 19 of 32 positions shown · IV contrast (iopamidol)
Comparison: 10/17/2017

CLINICAL DATA: Shortness of breath and fatigue for 1 month.

EXAM:
CT ANGIOGRAPHY CHEST WITH CONTRAST
TECHNIQUE: Multidetector CT imaging of the chest was performed using the
standard protocol during bolus administration of intravenous
contrast. Multiplanar CT image reconstructions and MIPs were
obtained to evaluate the vascular anatomy.
CONTRAST:  75mL 909YC8-DS8 IOPAMIDOL (909YC8-DS8) INJECTION 76%

[Series 5: thins 1.0 b31s · axial · 0.67mm/px · z∈[-290,-20]mm · 19 of 300 slices shown]
[im 15/300  lung]
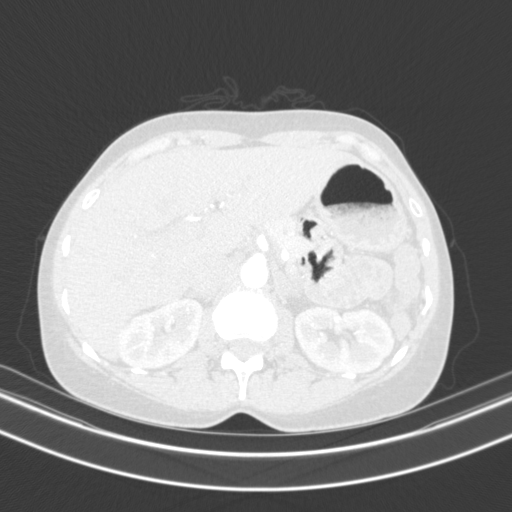
[im 30/300  mediastinal]
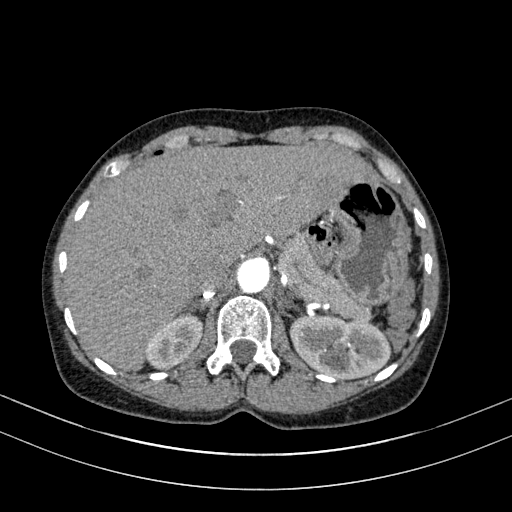
[im 45/300  lung]
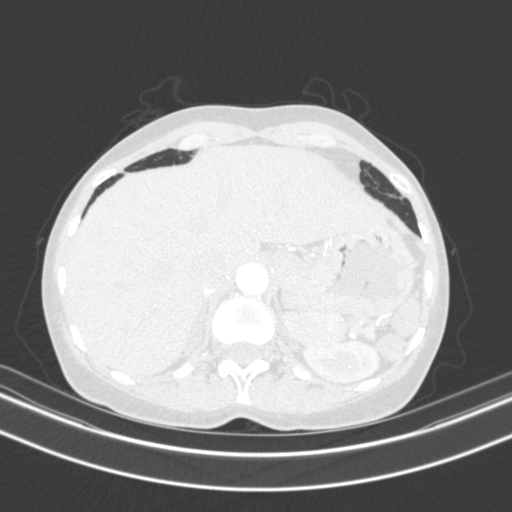
[im 75/300  mediastinal]
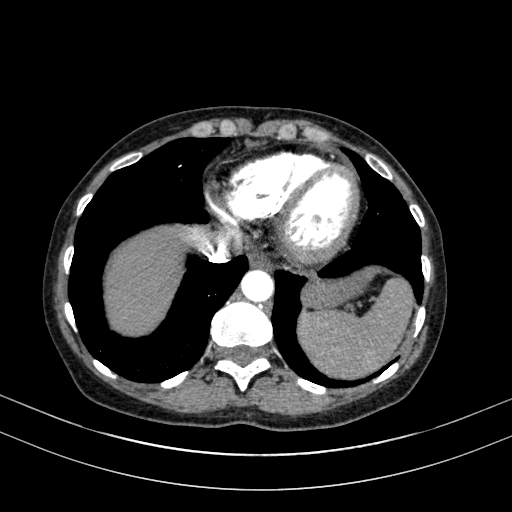
[im 90/300  lung]
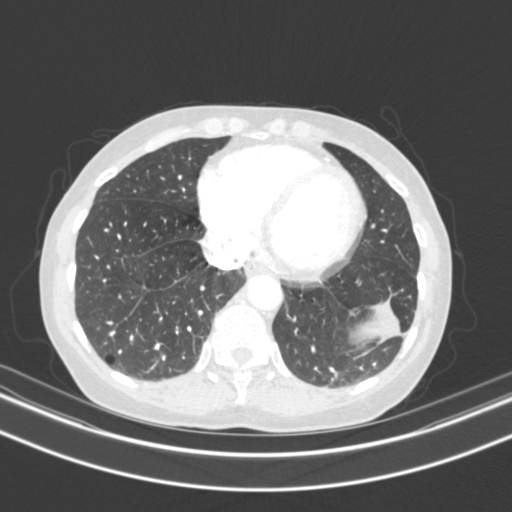
[im 100/300  mediastinal]
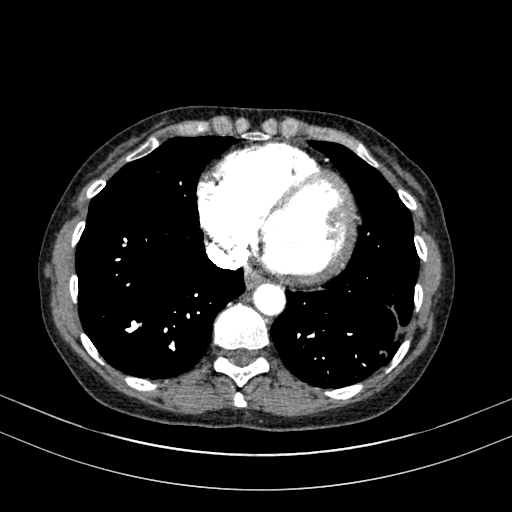
[im 105/300  lung]
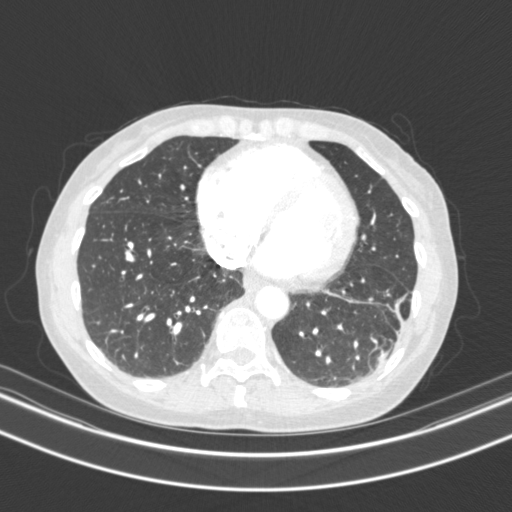
[im 120/300  mediastinal]
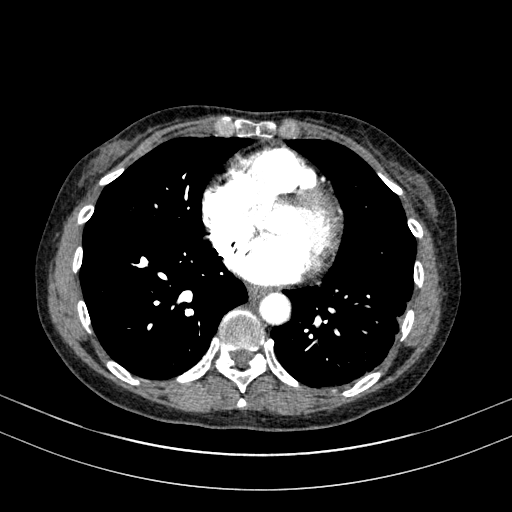
[im 135/300  lung]
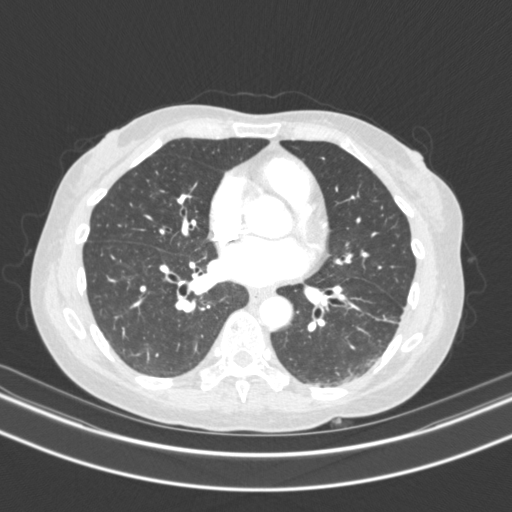
[im 150/300  mediastinal]
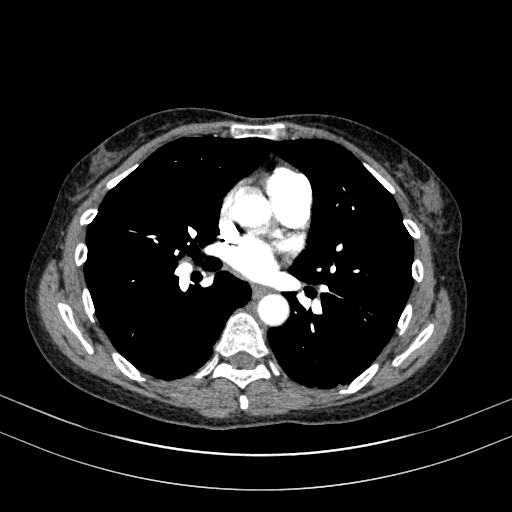
[im 165/300  lung]
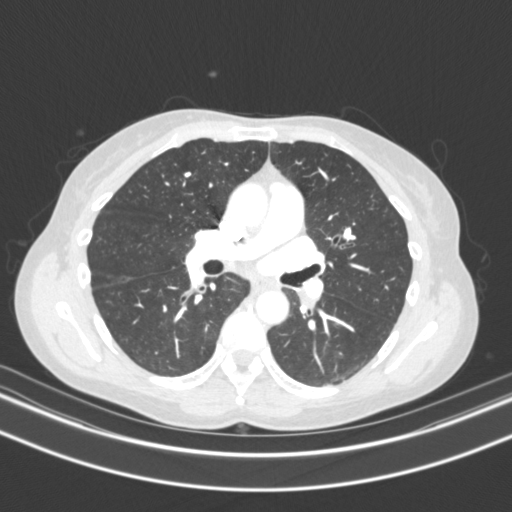
[im 180/300  mediastinal]
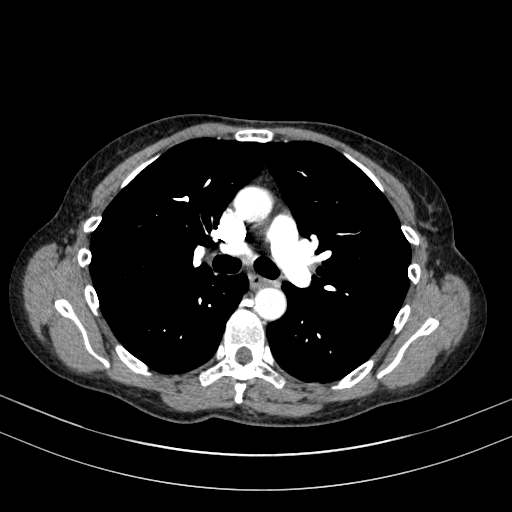
[im 195/300  lung]
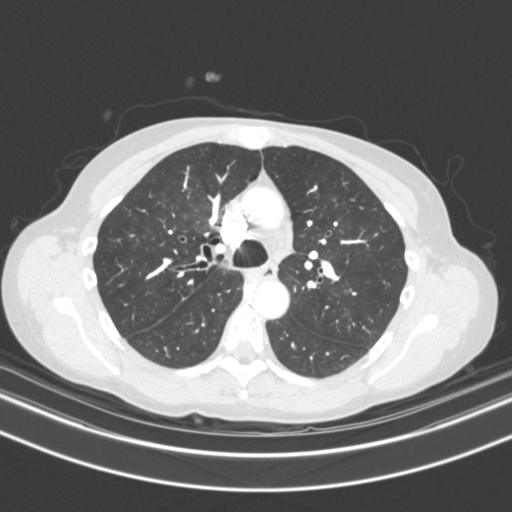
[im 200/300  mediastinal]
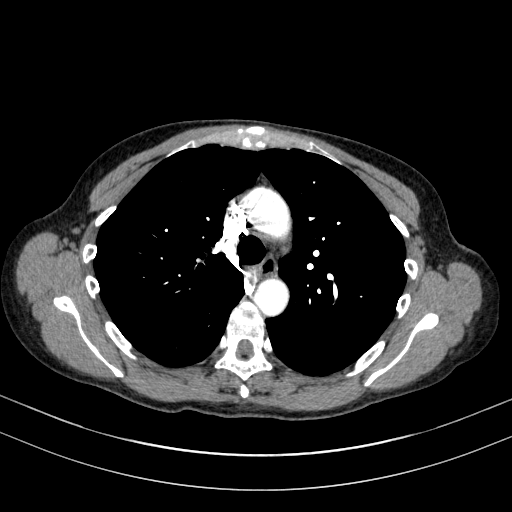
[im 210/300  lung]
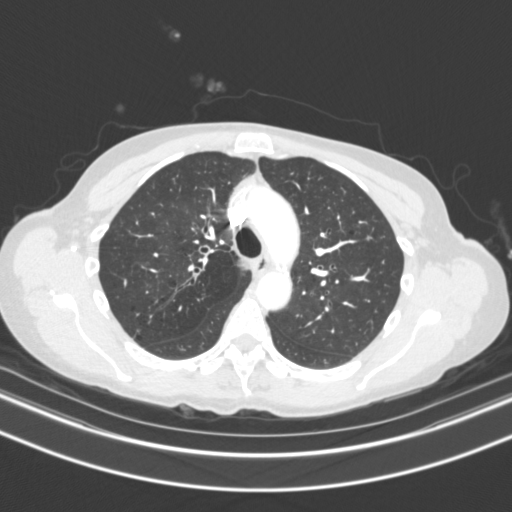
[im 225/300  mediastinal]
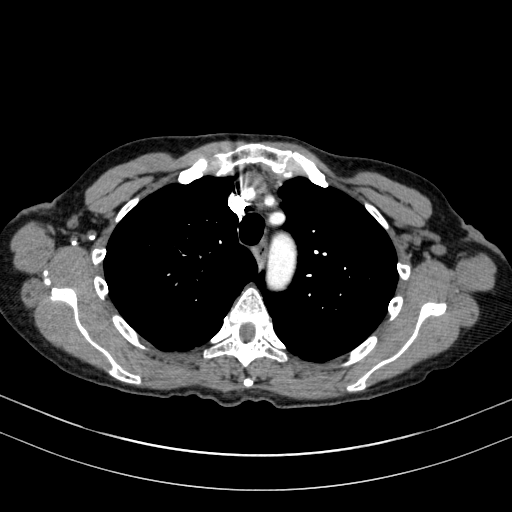
[im 255/300  lung]
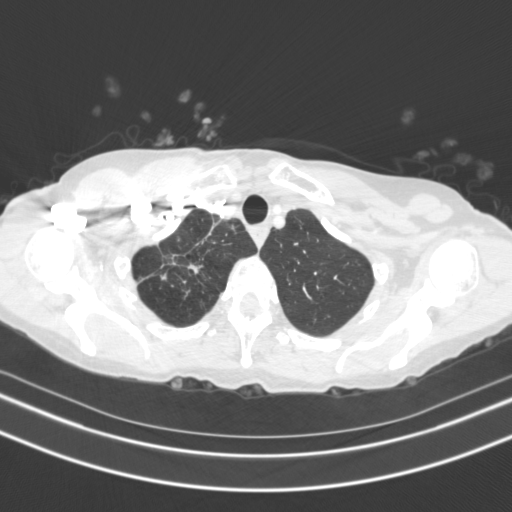
[im 270/300  mediastinal]
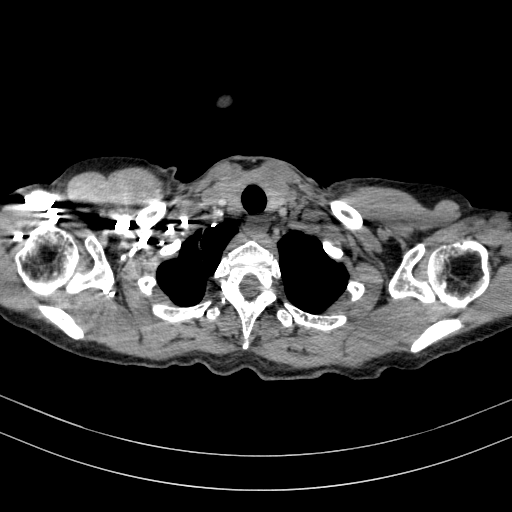
[im 285/300  lung]
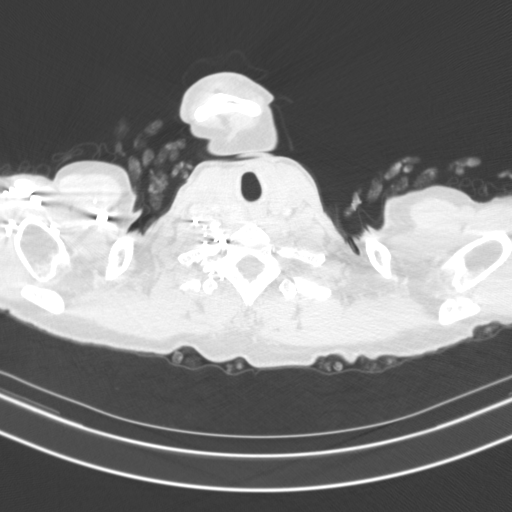

[19 of 32 positions shown; findings below may reference images not displayed]

FINDINGS: Cardiovascular: Heart size is normal. No significant pleural or
pericardial effusion is present. Minimal calcifications are present
along the undersurface of the aortic arch. Three vessel
configuration is present. No aneurysm or stenosis is present.

Pulmonary artery opacification is excellent. No focal filling
defects are present to suggest pulmonary embolus. Pulmonary artery
size is normal.

Mediastinum/Nodes: No significant mediastinal, hilar, or axillary
adenopathy is present.

Lungs/Pleura: Chronic scarring is again noted left lung base. No
focal nodule, mass, or airspace disease is present otherwise.
Scarring at the right lung apex is stable as well. No significant
pleural effusion or pneumothorax is present.

Upper Abdomen: Visualized upper abdomen is unremarkable.

Musculoskeletal: Vertebral body heights and alignment are normal. No
focal lytic or blastic lesions are present.

Review of the MIP images confirms the above findings.
IMPRESSION: 1. No pulmonary embolus.
2. No acute or focal lesion to explain the patient's shortness of
breath.
3. Chronic scarring at the left lung base and right lung apex is
stable.

## 2020-07-11 MED ORDER — IOPAMIDOL (ISOVUE-370) INJECTION 76%
75.0000 mL | Freq: Once | INTRAVENOUS | Status: AC | PRN
  Administered 2020-07-11: 75 mL via INTRAVENOUS

## 2021-02-11 ENCOUNTER — Other Ambulatory Visit: Payer: Self-pay | Admitting: Nurse Practitioner

## 2021-02-11 DIAGNOSIS — N6320 Unspecified lump in the left breast, unspecified quadrant: Secondary | ICD-10-CM

## 2021-03-26 ENCOUNTER — Other Ambulatory Visit: Payer: Medicaid Other

## 2021-05-11 DIAGNOSIS — Z79899 Other long term (current) drug therapy: Secondary | ICD-10-CM | POA: Diagnosis not present

## 2021-05-13 DIAGNOSIS — Z79899 Other long term (current) drug therapy: Secondary | ICD-10-CM | POA: Diagnosis not present

## 2021-06-10 DIAGNOSIS — Z79899 Other long term (current) drug therapy: Secondary | ICD-10-CM | POA: Diagnosis not present

## 2021-06-12 DIAGNOSIS — Z79899 Other long term (current) drug therapy: Secondary | ICD-10-CM | POA: Diagnosis not present

## 2021-07-15 DIAGNOSIS — Z79899 Other long term (current) drug therapy: Secondary | ICD-10-CM | POA: Diagnosis not present

## 2021-07-20 DIAGNOSIS — Z79899 Other long term (current) drug therapy: Secondary | ICD-10-CM | POA: Diagnosis not present

## 2021-08-06 DIAGNOSIS — Z79899 Other long term (current) drug therapy: Secondary | ICD-10-CM | POA: Diagnosis not present

## 2021-08-10 DIAGNOSIS — Z79899 Other long term (current) drug therapy: Secondary | ICD-10-CM | POA: Diagnosis not present

## 2021-09-07 DIAGNOSIS — Z79899 Other long term (current) drug therapy: Secondary | ICD-10-CM | POA: Diagnosis not present

## 2021-09-10 DIAGNOSIS — Z79899 Other long term (current) drug therapy: Secondary | ICD-10-CM | POA: Diagnosis not present

## 2021-10-15 DIAGNOSIS — Z79899 Other long term (current) drug therapy: Secondary | ICD-10-CM | POA: Diagnosis not present

## 2021-10-19 DIAGNOSIS — Z79899 Other long term (current) drug therapy: Secondary | ICD-10-CM | POA: Diagnosis not present

## 2021-11-12 DIAGNOSIS — Z79899 Other long term (current) drug therapy: Secondary | ICD-10-CM | POA: Diagnosis not present

## 2021-11-16 DIAGNOSIS — G4700 Insomnia, unspecified: Secondary | ICD-10-CM | POA: Diagnosis not present

## 2021-11-17 DIAGNOSIS — Z79899 Other long term (current) drug therapy: Secondary | ICD-10-CM | POA: Diagnosis not present

## 2021-12-15 DIAGNOSIS — Z79899 Other long term (current) drug therapy: Secondary | ICD-10-CM | POA: Diagnosis not present

## 2022-01-13 DIAGNOSIS — Z79899 Other long term (current) drug therapy: Secondary | ICD-10-CM | POA: Diagnosis not present

## 2022-01-15 DIAGNOSIS — Z79899 Other long term (current) drug therapy: Secondary | ICD-10-CM | POA: Diagnosis not present

## 2022-02-12 DIAGNOSIS — Z79899 Other long term (current) drug therapy: Secondary | ICD-10-CM | POA: Diagnosis not present

## 2022-02-17 DIAGNOSIS — Z79899 Other long term (current) drug therapy: Secondary | ICD-10-CM | POA: Diagnosis not present

## 2022-03-15 DIAGNOSIS — Z79899 Other long term (current) drug therapy: Secondary | ICD-10-CM | POA: Diagnosis not present

## 2022-03-17 DIAGNOSIS — Z79899 Other long term (current) drug therapy: Secondary | ICD-10-CM | POA: Diagnosis not present

## 2022-03-29 DIAGNOSIS — N898 Other specified noninflammatory disorders of vagina: Secondary | ICD-10-CM | POA: Diagnosis not present

## 2022-03-29 DIAGNOSIS — Z681 Body mass index (BMI) 19 or less, adult: Secondary | ICD-10-CM | POA: Diagnosis not present

## 2022-03-29 DIAGNOSIS — R300 Dysuria: Secondary | ICD-10-CM | POA: Diagnosis not present

## 2022-03-29 DIAGNOSIS — R630 Anorexia: Secondary | ICD-10-CM | POA: Diagnosis not present

## 2022-03-29 DIAGNOSIS — F32A Depression, unspecified: Secondary | ICD-10-CM | POA: Diagnosis not present

## 2022-03-29 DIAGNOSIS — F411 Generalized anxiety disorder: Secondary | ICD-10-CM | POA: Diagnosis not present

## 2022-04-16 DIAGNOSIS — Z79899 Other long term (current) drug therapy: Secondary | ICD-10-CM | POA: Diagnosis not present

## 2022-04-20 DIAGNOSIS — Z79899 Other long term (current) drug therapy: Secondary | ICD-10-CM | POA: Diagnosis not present

## 2022-05-12 DIAGNOSIS — Z79899 Other long term (current) drug therapy: Secondary | ICD-10-CM | POA: Diagnosis not present

## 2022-06-17 DIAGNOSIS — Z79899 Other long term (current) drug therapy: Secondary | ICD-10-CM | POA: Diagnosis not present

## 2022-06-17 DIAGNOSIS — Z131 Encounter for screening for diabetes mellitus: Secondary | ICD-10-CM | POA: Diagnosis not present

## 2022-06-21 DIAGNOSIS — Z79899 Other long term (current) drug therapy: Secondary | ICD-10-CM | POA: Diagnosis not present

## 2022-07-14 DIAGNOSIS — Z79899 Other long term (current) drug therapy: Secondary | ICD-10-CM | POA: Diagnosis not present

## 2022-07-20 DIAGNOSIS — Z79899 Other long term (current) drug therapy: Secondary | ICD-10-CM | POA: Diagnosis not present

## 2022-08-11 DIAGNOSIS — M129 Arthropathy, unspecified: Secondary | ICD-10-CM | POA: Diagnosis not present

## 2022-08-11 DIAGNOSIS — M62830 Muscle spasm of back: Secondary | ICD-10-CM | POA: Diagnosis not present

## 2022-08-11 DIAGNOSIS — Z79899 Other long term (current) drug therapy: Secondary | ICD-10-CM | POA: Diagnosis not present

## 2022-08-11 DIAGNOSIS — Z7251 High risk heterosexual behavior: Secondary | ICD-10-CM | POA: Diagnosis not present

## 2022-08-11 DIAGNOSIS — E559 Vitamin D deficiency, unspecified: Secondary | ICD-10-CM | POA: Diagnosis not present

## 2022-08-17 DIAGNOSIS — Z79899 Other long term (current) drug therapy: Secondary | ICD-10-CM | POA: Diagnosis not present

## 2022-09-08 ENCOUNTER — Emergency Department (HOSPITAL_COMMUNITY)
Admission: EM | Admit: 2022-09-08 | Discharge: 2022-09-08 | Disposition: A | Discharge status: Home / Self Care | Payer: Medicaid Other | Attending: Emergency Medicine | Admitting: Emergency Medicine

## 2022-09-08 ENCOUNTER — Other Ambulatory Visit: Payer: Self-pay

## 2022-09-08 ENCOUNTER — Emergency Department (HOSPITAL_COMMUNITY): Payer: Medicaid Other

## 2022-09-08 DIAGNOSIS — Z7901 Long term (current) use of anticoagulants: Secondary | ICD-10-CM | POA: Diagnosis not present

## 2022-09-08 DIAGNOSIS — K5900 Constipation, unspecified: Secondary | ICD-10-CM | POA: Diagnosis not present

## 2022-09-08 DIAGNOSIS — K648 Other hemorrhoids: Secondary | ICD-10-CM | POA: Insufficient documentation

## 2022-09-08 DIAGNOSIS — K6289 Other specified diseases of anus and rectum: Secondary | ICD-10-CM | POA: Diagnosis present

## 2022-09-08 DIAGNOSIS — R9431 Abnormal electrocardiogram [ECG] [EKG]: Secondary | ICD-10-CM | POA: Diagnosis not present

## 2022-09-08 DIAGNOSIS — R634 Abnormal weight loss: Secondary | ICD-10-CM

## 2022-09-08 DIAGNOSIS — C183 Malignant neoplasm of hepatic flexure: Secondary | ICD-10-CM

## 2022-09-08 DIAGNOSIS — R1030 Lower abdominal pain, unspecified: Secondary | ICD-10-CM | POA: Diagnosis not present

## 2022-09-08 DIAGNOSIS — Z85038 Personal history of other malignant neoplasm of large intestine: Secondary | ICD-10-CM | POA: Insufficient documentation

## 2022-09-08 DIAGNOSIS — K625 Hemorrhage of anus and rectum: Secondary | ICD-10-CM

## 2022-09-08 LAB — CBC WITH DIFFERENTIAL/PLATELET
Abs Immature Granulocytes: 0.01 10*3/uL (ref 0.00–0.07)
Basophils Absolute: 0 10*3/uL (ref 0.0–0.1)
Basophils Relative: 1 %
Eosinophils Absolute: 0.1 10*3/uL (ref 0.0–0.5)
Eosinophils Relative: 2 %
HCT: 41.1 % (ref 36.0–46.0)
Hemoglobin: 14.2 g/dL (ref 12.0–15.0)
Immature Granulocytes: 0 %
Lymphocytes Relative: 36 %
Lymphs Abs: 2 10*3/uL (ref 0.7–4.0)
MCH: 32 pg (ref 26.0–34.0)
MCHC: 34.5 g/dL (ref 30.0–36.0)
MCV: 92.6 fL (ref 80.0–100.0)
Monocytes Absolute: 0.8 10*3/uL (ref 0.1–1.0)
Monocytes Relative: 13 %
Neutro Abs: 2.7 10*3/uL (ref 1.7–7.7)
Neutrophils Relative %: 48 %
Platelets: 221 10*3/uL (ref 150–400)
RBC: 4.44 MIL/uL (ref 3.87–5.11)
RDW: 13.3 % (ref 11.5–15.5)
WBC: 5.7 10*3/uL (ref 4.0–10.5)
nRBC: 0 % (ref 0.0–0.2)

## 2022-09-08 LAB — COMPREHENSIVE METABOLIC PANEL
ALT: 15 U/L (ref 0–44)
AST: 23 U/L (ref 15–41)
Albumin: 4 g/dL (ref 3.5–5.0)
Alkaline Phosphatase: 89 U/L (ref 38–126)
Anion gap: 8 (ref 5–15)
BUN: 9 mg/dL (ref 6–20)
CO2: 27 mmol/L (ref 22–32)
Calcium: 8.7 mg/dL — ABNORMAL LOW (ref 8.9–10.3)
Chloride: 105 mmol/L (ref 98–111)
Creatinine, Ser: 0.68 mg/dL (ref 0.44–1.00)
GFR, Estimated: >60 mL/min (ref >60)
Glucose, Bld: 107 mg/dL — ABNORMAL HIGH (ref 70–99)
Potassium: 2.8 mmol/L — ABNORMAL LOW (ref 3.5–5.1)
Sodium: 140 mmol/L (ref 135–145)
Total Bilirubin: 0.4 mg/dL (ref 0.3–1.2)
Total Protein: 7.7 g/dL (ref 6.5–8.1)

## 2022-09-08 LAB — LIPASE, BLOOD: Lipase: 34 U/L (ref 11–51)

## 2022-09-08 LAB — URINALYSIS, ROUTINE W REFLEX MICROSCOPIC
Bilirubin Urine: NEGATIVE
Glucose, UA: NEGATIVE mg/dL
Hgb urine dipstick: NEGATIVE
Ketones, ur: 5 mg/dL — AB
Leukocytes,Ua: NEGATIVE
Nitrite: NEGATIVE
Protein, ur: 30 mg/dL — AB
Specific Gravity, Urine: 1.013 (ref 1.005–1.030)
pH: 6 (ref 5.0–8.0)

## 2022-09-08 LAB — POC OCCULT BLOOD, ED: Fecal Occult Bld: POSITIVE — AB

## 2022-09-08 MED ORDER — POTASSIUM CHLORIDE CRYS ER 20 MEQ PO TBCR
40.0000 meq | EXTENDED_RELEASE_TABLET | Freq: Once | ORAL | Status: AC
  Administered 2022-09-08: 40 meq via ORAL
  Filled 2022-09-08: qty 2

## 2022-09-08 MED ORDER — DOCUSATE SODIUM 100 MG PO CAPS: 100.0000 mg | ORAL_CAPSULE | Freq: Two times a day (BID) | ORAL | 0 refills | Status: DC

## 2022-09-08 MED ORDER — IOHEXOL 300 MG/ML  SOLN
80.0000 mL | Freq: Once | INTRAMUSCULAR | Status: AC | PRN
  Administered 2022-09-08: 80 mL via INTRAVENOUS

## 2022-09-08 NOTE — ED Provider Notes (Addendum)
[facility] DEPT Provider Note   CSN: [id number] Arrival date & time: 09/08/22  0957     History  Chief Complaint  Patient presents with   Rectal Bleeding    Emily Cameron is a 62 y.o. female.  HPI Patient reports she has had rectal bleeding several times over the past couple weeks.  She reports today she had a very large amount of bleeding with a bowel movement.  She reports there is blood in the toilet and blood when she wiped.  She reports she has been having problems with lower abdominal pain for several months.  Reports it is worse with eating.  Reports weight loss of about 60 pounds over the past 6 months.  Reports prior history of colonoscopy but reports cannot remember when.  Review of EMR appears to be 5 years ago.  No known history of colon cancer.  She reports a couple times over the past few weeks she is got very lightheaded and came close to passing out.    Home Medications Prior to Admission medications   Medication Sig Start Date End Date Taking? Authorizing Provider  docusate sodium (COLACE) 100 MG capsule Take 1 capsule (100 mg total) by mouth every 12 (twelve) hours. 09/08/22  Yes Charlesetta Shanks, MD  amitriptyline (ELAVIL) 25 MG tablet Take 1 tablet (25 mg total) by mouth at bedtime. 04/19/18   Argentina Donovan, PA-C  ARIPiprazole (ABILIFY) 5 MG tablet TAKE ONE TABLET BY MOUTH EVERY DAY *needs office visit FOR furhter refills* 04/19/18   Argentina Donovan, PA-C  famotidine (PEPCID) 20 MG tablet Take 1 tablet (20 mg total) by mouth daily. To reduce stomach acid 05/11/18   Fulp, Cammie, MD  megestrol (MEGACE) 40 MG/ML suspension TAKE 10 MLS (2 TEASPOONSFUL) BY MOUTH 2 TIMES A WEEK. 04/19/18   Freeman Caldron M, PA-C  mirtazapine (REMERON) 15 MG tablet Take 1 tablet (15 mg total) by mouth at bedtime. 04/19/18   Argentina Donovan, PA-C  potassium chloride (K-DUR) 10 MEQ tablet Take 1 tablet (10 mEq total) by mouth daily. 04/19/18   Argentina Donovan, PA-C  QUEtiapine (SEROQUEL) 100 MG tablet Take 1 tablet (100 mg total) by mouth at bedtime. 04/19/18   Argentina Donovan, PA-C  Tetrahydrozoline HCl (VISINE OP) Place 2 drops into both eyes as needed (for dry eyes). Reported on 09/09/2015    [provider]  traMADol (ULTRAM) 50 MG tablet Take 1 tablet (50 mg total) by mouth every 8 (eight) hours as needed. 30 day supply ; do not fill until 05/20/18 06/06/18   Fulp, Cammie, MD  warfarin (COUMADIN) 5 MG tablet Take 1 tablet (5 mg total) by mouth daily. 05/11/18   Charlott Rakes, MD      Allergies    Patient has no known allergies.    Review of Systems   Review of Systems  Physical Exam Updated Vital Signs BP (!) 130/43   Pulse 78   Temp 97.9 F (36.6 C)   Resp 14   Ht '5\' 3"'$  (1.6 m)   Wt 52.2 kg   LMP 01/26/2004   SpO2 99%   BMI 20.37 kg/m  Physical Exam Constitutional:      Comments: Alert and nontoxic.  No respiratory distress.  Very thin.  HENT:     Mouth/Throat:     Pharynx: Oropharynx is clear.  Eyes:     Extraocular Movements: Extraocular movements intact.  Cardiovascular:     Rate and Rhythm: Normal  rate and regular rhythm.  Pulmonary:     Effort: Pulmonary effort is normal.     Breath sounds: Normal breath sounds.  Abdominal:     Comments: Abdomen soft without guarding.  She has tenderness to palpation diffusely in the lower abdomen.  Genitourinary:    Comments: 1 nonthrombosed hemorrhoid.  Anal area otherwise normal in appearance.  Digital exam, no appreciable masses.  Small amount of formed stool in the vault.  Trace blood with mucus.  No melena and no cranberry colored blood. Musculoskeletal:        General: No swelling or tenderness. Normal range of motion.     Right lower leg: No edema.     Left lower leg: No edema.  Skin:    General: Skin is warm and dry.  Neurological:     General: No focal deficit present.     Mental Status: She is oriented to person, place, and time.     Coordination:  Coordination normal.  Psychiatric:        Mood and Affect: Mood normal.     ED Results / Procedures / Treatments   Labs (all labs ordered are listed, but only abnormal results are displayed) Labs Reviewed  COMPREHENSIVE METABOLIC PANEL - Abnormal; Notable for the following components:      Result Value   Potassium 2.8 (*)    Glucose, Bld 107 (*)    Calcium 8.7 (*)    All other components within normal limits  URINALYSIS, ROUTINE W REFLEX MICROSCOPIC - Abnormal; Notable for the following components:   APPearance HAZY (*)    Ketones, ur 5 (*)    Protein, ur 30 (*)    Bacteria, UA MANY (*)    All other components within normal limits  POC OCCULT BLOOD, ED - Abnormal; Notable for the following components:   Fecal Occult Bld POSITIVE (*)    All other components within normal limits  CBC WITH DIFFERENTIAL/PLATELET  LIPASE, BLOOD    EKG EKG Interpretation  Date/Time:  Wednesday September 08 2022 13:27:12 EST Ventricular Rate:  84 PR Interval:  173 QRS Duration: 104 QT Interval:  393 QTC Calculation: 465 R Axis:   -45 Text Interpretation: Sinus rhythm significant amount of artifact but no sig change from previous Confirmed by Charlesetta Shanks [phone]) on 09/08/2022 5:48:49 PM  Radiology CT Abdomen Pelvis W Contrast  Result Date: 09/08/2022 CLINICAL DATA:  Diverticulitis with complication suspected. Lower abdominal pain, rectal bleeding, and weight loss. Right red blood in stool since last week. Syncopal episodes. EXAM: CT ABDOMEN AND PELVIS WITH CONTRAST TECHNIQUE: Multidetector CT imaging of the abdomen and pelvis was performed using the standard protocol following bolus administration of intravenous contrast. RADIATION DOSE REDUCTION: This exam was performed according to the departmental dose-optimization program which includes automated exposure control, adjustment of the mA and/or kV according to patient size and/or use of iterative reconstruction technique. CONTRAST:  3m  OMNIPAQUE IOHEXOL 300 MG/ML  SOLN COMPARISON:  09/16/2013 FINDINGS: Lower chest: Linear atelectasis or scarring in the lung bases. Hepatobiliary: No focal liver abnormality is seen. No gallstones, gallbladder wall thickening, or biliary dilatation. Pancreas: Unremarkable. No pancreatic ductal dilatation or surrounding inflammatory changes. Spleen: Normal in size without focal abnormality. Adrenals/Urinary Tract: Adrenal glands are unremarkable. Kidneys are normal, without renal calculi, focal lesion, or hydronephrosis. Bladder is unremarkable. Stomach/Bowel: Stomach, small bowel, and colon are not abnormally distended. No findings to suggest acute diverticulitis. There is focal circumferential thickening of the wall of the transverse limb of  the hepatic flexure consistent with an apple core type colon cancer. See series 2, image 46 through 50. There is also a possible area of wall thickening at the rectosigmoid junction which could just be artifact but may indicate a synchronous lesion. Surgical consultation and/or colonoscopy recommended for further evaluation. The appendix is normal. Vascular/Lymphatic: No significant vascular findings are present. No enlarged abdominal or pelvic lymph nodes. Reproductive: Status post hysterectomy. No adnexal masses. Other: No free air or free fluid in the abdomen. Abdominal wall musculature appears intact. Musculoskeletal: Degenerative changes in the spine. No metastatic bone lesions are identified. IMPRESSION: 1. Circumferential mass in the transverse limb of the hepatic flexure consistent with colon cancer. Possible synchronous lesion versus artifact in the rectosigmoid junction. Surgical consultation and/or colonoscopy recommended for further evaluation. 2. No inflammatory changes or obstruction in the bowel. No evidence of acute diverticulitis. Electronically Signed   By: Lucienne Capers M.D.   On: 09/08/2022 19:23    Procedures Procedures    Medications Ordered in  ED Medications  potassium chloride SA (KLOR-CON M) CR tablet 40 mEq (40 mEq Oral Given 09/08/22 1818)  iohexol (OMNIPAQUE) 300 MG/ML solution 80 mL (80 mLs Intravenous Contrast Given 09/08/22 1851)    ED Course/ Medical Decision Making/ A&P                             Medical Decision Making Amount and/or Complexity of Data Reviewed Radiology: ordered.  Risk OTC drugs. Prescription drug management.   Patient presents with chief complaints of abdominal pain and now rectal bleeding.  Patient also reports more recently getting dizziness with position change.  CBC from triage has returned with stable hemoglobin at 14.2.  Rectal exam does not show melena.  Patient's abdominal pain, weight loss and now rectal bleeding I have concern for possible malignancy or bleeding diverticulitis or diverticulosis.  Will proceed with CT scan.  Patient has history of PE and is not anticoagulated.  He has been a number of years since has been anticoagulated.  No positive signs of PE at this time.  Patient is not hypoxic or tachypneic.  No chest pain no lower extremity pain or swelling.  CT scan with following radiology interpretation: 1. Circumferential mass in the transverse limb of the hepatic flexure consistent with colon cancer. Possible synchronous lesion versus artifact in the rectosigmoid junction. Surgical consultation and/or colonoscopy recommended for further evaluation. 2. No inflammatory changes or obstruction in the bowel. No evidence of acute diverticulitis.  Patient CBC is normal, digital exam does not reveal mass or impaction of the rectum.  There is trace red blood.  This test positive for occult blood.  CT scan shows findings concerning for colon cancer.  Consult: Reviewed with Dr. Julien Nordmann oncology.  He advises at this time patient should first be seen by gastroenterology for diagnostic testing and subsequent referral to oncology based on findings.  I have reviewed all of these findings  with the patient and her husband at bedside.  They are agreeable for follow-up and I extensively reviewed the importance of follow-up with soon as possible with gastroenterology to proceed with endoscopies and treatment options.  They voiced understanding.  Consult: I reviewed the with Dr. Paulita Fujita.  He reports the patient should call the office ASAP and they will ready to schedule a follow-up.          Final Clinical Impression(s) / ED Diagnoses Final diagnoses:  Malignant neoplasm of hepatic flexure (  [place])  Rectal bleeding  Weight loss  Constipation, unspecified constipation type    Rx / DC Orders ED Discharge Orders          Ordered    docusate sodium (COLACE) 100 MG capsule  Every 12 hours        09/08/22 2102              Charlesetta Shanks, MD 09/08/22 2108    Charlesetta Shanks, MD 09/08/22 2149

## 2022-09-08 NOTE — ED Triage Notes (Signed)
Pt reports bright red blood in stool since last week. Pt states had two syncopal episodes last week, bilateral hand and feet numbness.

## 2022-09-08 NOTE — Discharge Instructions (Addendum)
1.  It is very important that you follow-up with gastroenterology soon as possible.  [facility] is the group on-call for [facility] emergency department today.  Call them to schedule follow-up as soon as possible.  Your family doctor also should help you with the referrals and planning needed for further evaluation of suspected colon cancer. 2.  You describe constipation and difficulty passing stool.  Start taking Colace twice daily as prescribed.  Also review dietary recommendations for constipation to try to assist in using having bowel movements. 3.  Return to the emergency department if you get large episodes of bleeding, fever, worsening pain or other concerning changes.

## 2022-09-08 NOTE — ED Provider Triage Note (Signed)
Emergency Medicine Provider Triage Evaluation Note  Emily Cameron , a 62 y.o. female  was evaluated in triage.  Pt complains of fatigue, abdominal pain, depression.  Symptoms have been present for the last 7 months.  States they have gotten worse recently.  She has noticed hematochezia on 2 separate occasions over the last week.  This is not normal for her.  Has no history of hemorrhoids.  Has abdominal pain quite frequently.  Had 1 episode of near syncope approximately 5 days ago.  She states she stood up from a chair and felt dizzy and lightheaded.  She sat down for a while and then was able to ambulate as normal.  States she has a history of PE but stopped taking her blood thinner approximately 1 year ago.  Has had shortness of breath over the past Week as well  Review of Systems  Positive: As above Negative: As above  Physical Exam  BP 136/76 (BP Location: Right Arm)   Pulse 85   Temp 98.6 F (37 C) (Oral)   Resp 16   Ht '5\' 3"'$  (1.6 m)   Wt 52.2 kg   LMP 01/26/2004   SpO2 95%   BMI 20.37 kg/m  Gen:   Awake, no distress, tearful  Resp:  Normal effort  MSK:   Moves extremities without difficulty  Other:    Medical Decision Making  Medically screening exam initiated at 10:34 AM.  Appropriate orders placed.  SHAWNDA MAUNEY was informed that the remainder of the evaluation will be completed by another provider, this initial triage assessment does not replace that evaluation, and the importance of remaining in the ED until their evaluation is complete.    Roylene Reason, [place] 09/08/22 1036

## 2022-09-13 DIAGNOSIS — R9389 Abnormal findings on diagnostic imaging of other specified body structures: Secondary | ICD-10-CM | POA: Diagnosis not present

## 2022-09-13 DIAGNOSIS — Z79899 Other long term (current) drug therapy: Secondary | ICD-10-CM | POA: Diagnosis not present

## 2022-09-14 ENCOUNTER — Other Ambulatory Visit: Payer: Self-pay | Admitting: Gastroenterology

## 2022-09-16 DIAGNOSIS — Z79899 Other long term (current) drug therapy: Secondary | ICD-10-CM | POA: Diagnosis not present

## 2022-09-20 ENCOUNTER — Encounter (HOSPITAL_COMMUNITY): Payer: Self-pay | Admitting: Gastroenterology

## 2022-09-20 NOTE — Progress Notes (Signed)
Attempted to obtain medical history via telephone, unable to reach at this time. HIPAA compliant voicemail message left requesting return call to pre surgical testing department.  

## 2022-09-27 ENCOUNTER — Encounter (HOSPITAL_COMMUNITY): Payer: Self-pay | Admitting: Gastroenterology

## 2022-09-27 ENCOUNTER — Encounter (HOSPITAL_COMMUNITY)
Admission: RE | Disposition: A | Discharge status: Home / Self Care | Payer: Self-pay | Source: Ambulatory Visit | Attending: Gastroenterology

## 2022-09-27 ENCOUNTER — Ambulatory Visit (HOSPITAL_BASED_OUTPATIENT_CLINIC_OR_DEPARTMENT_OTHER): Payer: Medicaid Other | Admitting: Anesthesiology

## 2022-09-27 ENCOUNTER — Ambulatory Visit (HOSPITAL_COMMUNITY)
Admission: RE | Admit: 2022-09-27 | Discharge: 2022-09-27 | Disposition: A | Discharge status: Home / Self Care | Payer: Medicaid Other | Source: Ambulatory Visit | Attending: Gastroenterology | Admitting: Gastroenterology

## 2022-09-27 ENCOUNTER — Ambulatory Visit (HOSPITAL_COMMUNITY): Payer: Medicaid Other | Admitting: Anesthesiology

## 2022-09-27 ENCOUNTER — Other Ambulatory Visit: Payer: Self-pay

## 2022-09-27 DIAGNOSIS — F419 Anxiety disorder, unspecified: Secondary | ICD-10-CM | POA: Diagnosis not present

## 2022-09-27 DIAGNOSIS — D123 Benign neoplasm of transverse colon: Secondary | ICD-10-CM | POA: Diagnosis not present

## 2022-09-27 DIAGNOSIS — K921 Melena: Secondary | ICD-10-CM | POA: Diagnosis not present

## 2022-09-27 DIAGNOSIS — F32A Depression, unspecified: Secondary | ICD-10-CM | POA: Diagnosis not present

## 2022-09-27 DIAGNOSIS — D490 Neoplasm of unspecified behavior of digestive system: Secondary | ICD-10-CM | POA: Diagnosis not present

## 2022-09-27 DIAGNOSIS — K5669 Other partial intestinal obstruction: Secondary | ICD-10-CM | POA: Diagnosis not present

## 2022-09-27 DIAGNOSIS — G4730 Sleep apnea, unspecified: Secondary | ICD-10-CM | POA: Insufficient documentation

## 2022-09-27 DIAGNOSIS — F17210 Nicotine dependence, cigarettes, uncomplicated: Secondary | ICD-10-CM

## 2022-09-27 DIAGNOSIS — J449 Chronic obstructive pulmonary disease, unspecified: Secondary | ICD-10-CM | POA: Diagnosis not present

## 2022-09-27 DIAGNOSIS — F17200 Nicotine dependence, unspecified, uncomplicated: Secondary | ICD-10-CM | POA: Diagnosis not present

## 2022-09-27 DIAGNOSIS — R634 Abnormal weight loss: Secondary | ICD-10-CM | POA: Diagnosis not present

## 2022-09-27 DIAGNOSIS — F418 Other specified anxiety disorders: Secondary | ICD-10-CM | POA: Diagnosis not present

## 2022-09-27 DIAGNOSIS — R933 Abnormal findings on diagnostic imaging of other parts of digestive tract: Secondary | ICD-10-CM | POA: Insufficient documentation

## 2022-09-27 HISTORY — PX: BIOPSY: SHX5522

## 2022-09-27 HISTORY — PX: POLYPECTOMY: SHX5525

## 2022-09-27 HISTORY — PX: HEMOSTASIS CLIP PLACEMENT: SHX6857

## 2022-09-27 HISTORY — PX: SUBMUCOSAL TATTOO INJECTION: SHX6856

## 2022-09-27 HISTORY — PX: COLONOSCOPY WITH PROPOFOL: SHX5780

## 2022-09-27 LAB — POCT I-STAT, CHEM 8
BUN: 5 mg/dL — ABNORMAL LOW (ref 6–20)
Calcium, Ion: 1.15 mmol/L (ref 1.15–1.40)
Chloride: 105 mmol/L (ref 98–111)
Creatinine, Ser: 0.6 mg/dL (ref 0.44–1.00)
Glucose, Bld: 77 mg/dL (ref 70–99)
HCT: 42 % (ref 36.0–46.0)
Hemoglobin: 14.3 g/dL (ref 12.0–15.0)
Potassium: 3.1 mmol/L — ABNORMAL LOW (ref 3.5–5.1)
Sodium: 143 mmol/L (ref 135–145)
TCO2: 26 mmol/L (ref 22–32)

## 2022-09-27 SURGERY — COLONOSCOPY WITH PROPOFOL
Anesthesia: Monitor Anesthesia Care | Laterality: Bilateral

## 2022-09-27 MED ORDER — PROPOFOL 10 MG/ML IV BOLUS
INTRAVENOUS | Status: DC | PRN
  Administered 2022-09-27 (×7): 20 mg via INTRAVENOUS

## 2022-09-27 MED ORDER — PROPOFOL 1000 MG/100ML IV EMUL
INTRAVENOUS | Status: AC
  Filled 2022-09-27: qty 100

## 2022-09-27 MED ORDER — LACTATED RINGERS IV SOLN: INTRAVENOUS | Status: DC

## 2022-09-27 MED ORDER — PROPOFOL 500 MG/50ML IV EMUL
INTRAVENOUS | Status: DC | PRN
  Administered 2022-09-27: 125 ug/kg/min via INTRAVENOUS

## 2022-09-27 MED ORDER — LIDOCAINE 2% (20 MG/ML) 5 ML SYRINGE
INTRAMUSCULAR | Status: DC | PRN
  Administered 2022-09-27: 100 mg via INTRAVENOUS

## 2022-09-27 MED ORDER — PROMETHAZINE HCL 25 MG/ML IJ SOLN: 6.2500 mg | INTRAMUSCULAR | Status: DC | PRN

## 2022-09-27 MED ORDER — OXYCODONE HCL 5 MG PO TABS: 5.0000 mg | ORAL_TABLET | Freq: Once | ORAL | Status: DC | PRN

## 2022-09-27 MED ORDER — OXYCODONE HCL 5 MG/5ML PO SOLN: 5.0000 mg | Freq: Once | ORAL | Status: DC | PRN

## 2022-09-27 MED ORDER — SPOT INK MARKER SYRINGE KIT
PACK | SUBMUCOSAL | Status: DC | PRN
  Administered 2022-09-27: 5 mL via SUBMUCOSAL

## 2022-09-27 MED ORDER — PROPOFOL 10 MG/ML IV BOLUS
INTRAVENOUS | Status: AC
  Filled 2022-09-27: qty 20

## 2022-09-27 MED ORDER — HYDROMORPHONE HCL 1 MG/ML IJ SOLN: 0.2500 mg | INTRAMUSCULAR | Status: DC | PRN

## 2022-09-27 SURGICAL SUPPLY — 22 items

## 2022-09-27 NOTE — Discharge Instructions (Signed)

## 2022-09-27 NOTE — Anesthesia Postprocedure Evaluation (Signed)
Anesthesia Post Note  Patient: Emily Cameron  Procedure(s) Performed: COLONOSCOPY WITH PROPOFOL (Bilateral) BIOPSY SUBMUCOSAL TATTOO INJECTION POLYPECTOMY HEMOSTASIS CLIP PLACEMENT     Patient location during evaluation: PACU Anesthesia Type: MAC Level of consciousness: awake and alert Pain management: pain level controlled Vital Signs Assessment: post-procedure vital signs reviewed and stable Respiratory status: spontaneous breathing, nonlabored ventilation and respiratory function stable Cardiovascular status: blood pressure returned to baseline and stable Postop Assessment: no apparent nausea or vomiting Anesthetic complications: no   No notable events documented.  Last Vitals:  Vitals:   09/27/22 0840 09/27/22 0850  BP: 127/75 136/82  Pulse: 83 66  Resp: (!) 21 20  Temp:    SpO2: 99% 100%    Last Pain:  Vitals:   09/27/22 0850  TempSrc:   PainSc: 0-No pain                 Lynda Rainwater

## 2022-09-27 NOTE — Op Note (Signed)
[facility] Patient Name: Emily Cameron Procedure Date: 09/27/2022 MRN: [id number] Attending MD: Arta Silence , MD, [id number] Date of Birth: 01-Nov-1960 CSN: [id number] Age: 62 Admit Type: Outpatient Procedure:                Colonoscopy Indications:              Hematochezia, Abnormal CT of the GI tract (abnormal                            hepatic flexure and possible abnormal rectosigmoid                            colon), Change in bowel habits, Weight loss Providers:                Arta Silence, MD, Brien Mates, Technician,                            Mikey College, RN Referring MD:              Medicines:                Monitored Anesthesia Care Complications:            No immediate complications. Estimated Blood Loss:     Estimated blood loss: none. Procedure:                Pre-Anesthesia Assessment:                           - Prior to the procedure, a History and Physical                            was performed, and patient medications and                            allergies were reviewed. The patient's tolerance of                            previous anesthesia was also reviewed. The risks                            and benefits of the procedure and the sedation                            options and risks were discussed with the patient.                            All questions were answered, and informed consent                            was obtained. Prior Anticoagulants: The patient has                            taken no anticoagulant or antiplatelet agents. ASA  Grade Assessment: III - A patient with severe                            systemic disease. After reviewing the risks and                            benefits, the patient was deemed in satisfactory                            condition to undergo the procedure.                           After obtaining informed consent, the colonoscope                             was passed under direct vision. Throughout the                            procedure, the patient's blood pressure, pulse, and                            oxygen saturations were monitored continuously. The                            PCF-H190TL ([id number]) Olympus slim colonoscope was                            introduced through the anus and advanced to the the                            hepatic flexure to examine a mass. This was the                            intended extent. The colonoscopy was performed                            without difficulty. The patient tolerated the                            procedure well. The quality of the bowel                            preparation was good. The rectum was photographed. Scope In: 7:48:18 AM Scope Out: 8:18:11 AM Total Procedure Duration: 0 hours 29 minutes 53 seconds  Findings:      The perianal and digital rectal examinations were normal.      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 The mass was       circumferential. Oozing was present. This was biopsied with a cold       forceps for histology. Area was tattooed with an injection of Spot       (carbon black). Unable to pass ultraslim colonoscope proximal to this       mass.  A 15 mm polyp was found in the transverse colon mid transverse colon.       The polyp was pedunculated. The polyp was removed with a hot snare.       Resection and retrieval were complete. To prevent bleeding       post-intervention, one hemostatic clip was successfully placed. There       was no bleeding at the end of the procedure.      Colon otherwise normal; no other polyps, masses, vascular ectasias, or       inflammatory changes were seen.      The retroflexed view of the distal rectum and anal verge was normal and       showed no anal or rectal abnormalities. Impression:               - Rule out malignancy, partially  obstructing tumor                            at the hepatic flexure. Biopsied. Tattooed. This                            represents finding at hepatic flexure on CT scan.                           - One 15 mm polyp in the transverse colon in the                            mid transverse colon, removed with a hot snare.                            Resected and retrieved. Clip was placed.                           - The distal rectum and anal verge are normal on                            retroflexion view.                           - The examination was otherwise normal. No                            rectosigmoid mass/lesion seen. Moderate Sedation:      None Recommendation:           - Patient has a contact number available for                            emergencies. The signs and symptoms of potential                            delayed complications were discussed with the                            patient. Return to normal activities tomorrow.  Written discharge instructions were provided to the                            patient.                           - Discharge patient to home (ambulatory).                           - Soft diet indefinitely.                           - Continue present medications.                           - Await pathology results.                           - Repeat colonoscopy (date not yet determined) for                            surveillance based on pathology results.                           - Refer to a surgeon at appointment to be scheduled.                           - Refer to an oncologist at appointment to be                            scheduled. Procedure Code(s):        --- Professional ---                           [phone], 52, Colonoscopy, flexible; with removal of                            tumor(s), polyp(s), or other lesion(s) by snare                            technique                           45380, 59,52,  Colonoscopy, flexible; with biopsy,                            single or multiple                           45381, 52, Colonoscopy, flexible; with directed                            submucosal injection(s), any substance Diagnosis Code(s):        --- Professional ---                           D49.0, Neoplasm of unspecified behavior of  digestive system                           K56.690, Other partial intestinal obstruction                           D12.3, Benign neoplasm of transverse colon (hepatic                            flexure or splenic flexure)                           K92.1, Melena (includes Hematochezia)                           R19.4, Change in bowel habit                           R63.4, Abnormal weight loss                           R93.3, Abnormal findings on diagnostic imaging of                            other parts of digestive tract CPT copyright 2022 [employer]. All rights reserved. The codes documented in this report are preliminary and upon coder review may  be revised to meet current compliance requirements. Arta Silence, MD 09/27/2022 8:25:15 AM This report has been signed electronically. Number of Addenda: 0

## 2022-09-27 NOTE — Transfer of Care (Signed)
Immediate Anesthesia Transfer of Care Note  Patient: Emily Cameron  Procedure(s) Performed: COLONOSCOPY WITH PROPOFOL (Bilateral) BIOPSY SUBMUCOSAL TATTOO INJECTION POLYPECTOMY HEMOSTASIS CLIP PLACEMENT  Patient Location: Endoscopy Unit  Anesthesia Type:MAC  Level of Consciousness: drowsy  Airway & Oxygen Therapy: Patient Spontanous Breathing and Patient connected to face mask oxygen  Post-op Assessment: Report given to RN and Post -op Vital signs reviewed and stable  Post vital signs: Reviewed and stable  Last Vitals:  Vitals Value Taken Time  BP    Temp    Pulse    Resp    SpO2      Last Pain:  Vitals:   09/27/22 0652  TempSrc: Temporal  PainSc: 1          Complications: No notable events documented.

## 2022-09-27 NOTE — Anesthesia Preprocedure Evaluation (Addendum)
Anesthesia Evaluation  Patient identified by MRN, date of birth, ID band Patient awake    Reviewed: Allergy & Precautions, H&P , NPO status , Patient's Chart, lab work & pertinent test results  Airway Mallampati: II   Neck ROM: Full    Dental  (+) Edentulous Lower, Edentulous Upper   Pulmonary sleep apnea , COPD, Current Smoker and Patient abstained from smoking.   breath sounds clear to auscultation       Cardiovascular  Rhythm:Regular     Neuro/Psych   Anxiety Depression       GI/Hepatic   Endo/Other  Documented hypoglycemia on previous labs   Renal/GU      Musculoskeletal   Abdominal  (+)  Abdomen: soft.   Peds  Hematology   Anesthesia Other Findings   Reproductive/Obstetrics                             Anesthesia Physical Anesthesia Plan  ASA: III  Anesthesia Plan: MAC   Post-op Pain Management: Minimal or no pain anticipated   Induction: Intravenous  PONV Risk Score and Plan: 1 and Ondansetron and Treatment may vary due to age or medical condition  Airway Management Planned: Simple Face Mask  Additional Equipment:   Intra-op Plan:   Post-operative Plan:   Informed Consent: I have reviewed the patients History and Physical, chart, labs and discussed the procedure including the risks, benefits and alternatives for the proposed anesthesia with the patient or authorized representative who has indicated his/her understanding and acceptance.       Plan Discussed with:   Anesthesia Plan Comments:         Anesthesia Quick Evaluation

## 2022-09-27 NOTE — Anesthesia Procedure Notes (Signed)
Date/Time: 09/27/2022 7:40 AM  Performed by: Sharlette Dense, CRNAOxygen Delivery Method: Simple face mask

## 2022-09-27 NOTE — H&P (Signed)
[facility] H/P Note  Chief Complaint: hematochezia  HPI: Emily Cameron is an 62 y.o. female.  Here for colonoscopy to evaluate abdominal pain, hematochezia, weight loss, abnormal CT scan (suspected mass hepatic flexure, possible mass rectosigmoid colon).  Last colonoscopy 10+ years ago (elsewhere).  Past Medical History:  Diagnosis Date   Alcohol abuse    Anxiety    COPD (chronic obstructive pulmonary disease) ([place])    Depression    History of kidney stones    Insomnia    PE (pulmonary embolism) 2006   Polysubstance abuse ([place])    Current smoking and alcohol. 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 Marijuna not taking since 7 month.    Weight loss    due to anxiety    Past Surgical History:  Procedure Laterality Date   ABDOMINAL HYSTERECTOMY     CYST EXCISION     LITHOTRIPSY     MICROLARYNGOSCOPY Left 06/20/2014   Procedure: MICROLARYNGOSCOPY WITH REMOVAL OF LEFT VOCAL CORD MASS;  Surgeon: Melissa Montane, MD;  Location: [place];  Service: ENT;  Laterality: Left;    Medications Prior to Admission  Medication Sig Dispense Refill   amitriptyline (ELAVIL) 25 MG tablet Take 1 tablet (25 mg total) by mouth at bedtime. (Patient taking differently: Take 50 mg by mouth at bedtime.) 30 tablet 5   ARIPiprazole (ABILIFY) 5 MG tablet TAKE ONE TABLET BY MOUTH EVERY DAY *needs office visit FOR furhter refills* 30 tablet 3   gabapentin (NEURONTIN) 800 MG tablet Take 800 mg by mouth 3 (three) times daily as needed (pain).     mirtazapine (REMERON) 15 MG tablet Take 1 tablet (15 mg total) by mouth at bedtime. 30 tablet 3   oxyCODONE-acetaminophen (PERCOCET) 10-325 MG tablet Take 1 tablet by mouth 5 (five) times daily as needed for pain.     potassium chloride (K-DUR) 10 MEQ tablet Take 1 tablet (10 mEq total) by mouth daily. 30 tablet 3   QUEtiapine (SEROQUEL) 100 MG tablet Take 1 tablet (100 mg total) by mouth at bedtime. 30 tablet 3   tiZANidine (ZANAFLEX) 4 MG capsule Take  4 mg by mouth 3 (three) times daily as needed for muscle spasms.     docusate sodium (COLACE) 100 MG capsule Take 1 capsule (100 mg total) by mouth every 12 (twelve) hours. 60 capsule 0   famotidine (PEPCID) 20 MG tablet Take 1 tablet (20 mg total) by mouth daily. To reduce stomach acid 30 tablet 11   megestrol (MEGACE) 40 MG/ML suspension TAKE 10 MLS (2 TEASPOONSFUL) BY MOUTH 2 TIMES A WEEK. 80 mL 0   Tetrahydrozoline HCl (VISINE OP) Place 2 drops into both eyes as needed (for dry eyes). Reported on 09/09/2015     traMADol (ULTRAM) 50 MG tablet Take 1 tablet (50 mg total) by mouth every 8 (eight) hours as needed. 30 day supply ; do not fill until 05/20/18 30 tablet 0   warfarin (COUMADIN) 5 MG tablet Take 1 tablet (5 mg total) by mouth daily. 30 tablet 3    Allergies: No Known Allergies  Family History  Problem Relation Age of Onset   Aneurysm Brother 57   Aneurysm Paternal Aunt 62       Died    Coronary artery disease Sister    Heart disease Mother    Hyperlipidemia Mother    Hypertension Mother    Heart disease Maternal Grandmother    Pulmonary embolism Brother     Social History:  reports that she has been smoking cigarettes. She has a 15.50 pack-year smoking history. She has never used smokeless tobacco. She reports current alcohol use. She reports current drug use. Frequency: 7.00 times per week. Drug: Marijuana.   ROS: As per HPI, all others negative   Blood pressure 125/73, temperature 98 F (36.7 C), temperature source Temporal, resp. rate 16, height '5\' 3"'$  (1.6 m), weight 38.6 kg, last menstrual period 01/26/2004, SpO2 98 %. General appearance: thin, cachectic HEENT:  [place]/AT, anicteric NECK:  Thin, supple CV:  Regular Lungs:  No respiratory distress ABD:  Soft, thin, non-tender NEURO:  A/O, no encephalopathy.  Results for orders placed or performed during the hospital encounter of 09/27/22 (from the past 48 hour(s))  I-STAT, chem 8     Status: Abnormal   Collection  Time: 09/27/22  7:17 AM  Result Value Ref Range   Sodium 143 135 - 145 mmol/L   Potassium 3.1 (L) 3.5 - 5.1 mmol/L   Chloride 105 98 - 111 mmol/L   BUN 5 (L) 6 - 20 mg/dL   Creatinine, Ser 0.60 0.44 - 1.00 mg/dL   Glucose, Bld 77 70 - 99 mg/dL    Comment: Glucose reference range applies only to samples taken after fasting for at least 8 hours.   Calcium, Ion 1.15 1.15 - 1.40 mmol/L   TCO2 26 22 - 32 mmol/L   Hemoglobin 14.3 12.0 - 15.0 g/dL   HCT 42.0 36.0 - 46.0 %   No results found.  Assessment/Plan   Hematochezia. Weight loss. Abnormal CT scan.  Suspected mass hepatic flexure +/- rectosigmoid colon. Colonoscopy for further evaluation. Risks (bleeding, infection, bowel perforation that could require surgery, sedation-related changes in cardiopulmonary systems), benefits (identification and possible treatment of source of symptoms, exclusion of certain causes of symptoms), and alternatives (watchful waiting, radiographic imaging studies, empiric medical treatment) of colonoscopy were explained to patient/family in detail and patient wishes to proceed.   Landry Dyke 09/27/2022, 7:37 AM

## 2022-09-28 LAB — SURGICAL PATHOLOGY

## 2022-10-01 ENCOUNTER — Encounter (HOSPITAL_COMMUNITY): Payer: Self-pay | Admitting: Gastroenterology

## 2022-10-05 ENCOUNTER — Ambulatory Visit: Payer: Self-pay | Admitting: Surgery

## 2022-10-05 ENCOUNTER — Other Ambulatory Visit (HOSPITAL_BASED_OUTPATIENT_CLINIC_OR_DEPARTMENT_OTHER): Payer: Self-pay | Admitting: Surgery

## 2022-10-05 ENCOUNTER — Ambulatory Visit (HOSPITAL_BASED_OUTPATIENT_CLINIC_OR_DEPARTMENT_OTHER): Payer: Medicaid Other

## 2022-10-05 DIAGNOSIS — C183 Malignant neoplasm of hepatic flexure: Secondary | ICD-10-CM | POA: Diagnosis not present

## 2022-10-05 DIAGNOSIS — R739 Hyperglycemia, unspecified: Secondary | ICD-10-CM

## 2022-10-05 DIAGNOSIS — R634 Abnormal weight loss: Secondary | ICD-10-CM | POA: Insufficient documentation

## 2022-10-05 DIAGNOSIS — Z720 Tobacco use: Secondary | ICD-10-CM | POA: Diagnosis not present

## 2022-10-05 DIAGNOSIS — K5909 Other constipation: Secondary | ICD-10-CM

## 2022-10-05 DIAGNOSIS — C182 Malignant neoplasm of ascending colon: Secondary | ICD-10-CM | POA: Insufficient documentation

## 2022-10-05 DIAGNOSIS — Z86718 Personal history of other venous thrombosis and embolism: Secondary | ICD-10-CM | POA: Diagnosis not present

## 2022-10-06 ENCOUNTER — Ambulatory Visit (HOSPITAL_BASED_OUTPATIENT_CLINIC_OR_DEPARTMENT_OTHER)
Admission: RE | Admit: 2022-10-06 | Discharge: 2022-10-06 | Disposition: A | Discharge status: Home / Self Care | Payer: Medicaid Other | Source: Ambulatory Visit | Attending: Surgery | Admitting: Surgery

## 2022-10-06 DIAGNOSIS — K5909 Other constipation: Secondary | ICD-10-CM | POA: Diagnosis not present

## 2022-10-06 DIAGNOSIS — C189 Malignant neoplasm of colon, unspecified: Secondary | ICD-10-CM | POA: Diagnosis not present

## 2022-10-06 DIAGNOSIS — C183 Malignant neoplasm of hepatic flexure: Secondary | ICD-10-CM | POA: Insufficient documentation

## 2022-10-06 DIAGNOSIS — J432 Centrilobular emphysema: Secondary | ICD-10-CM | POA: Diagnosis not present

## 2022-10-06 DIAGNOSIS — R634 Abnormal weight loss: Secondary | ICD-10-CM | POA: Diagnosis not present

## 2022-10-06 DIAGNOSIS — R918 Other nonspecific abnormal finding of lung field: Secondary | ICD-10-CM | POA: Diagnosis not present

## 2022-10-06 MED ORDER — IOHEXOL 300 MG/ML  SOLN
100.0000 mL | Freq: Once | INTRAMUSCULAR | Status: AC | PRN
  Administered 2022-10-06: 50 mL via INTRAVENOUS

## 2022-10-07 NOTE — Progress Notes (Addendum)
COVID Vaccine Completed:  No  Date of COVID positive in last 90 days:  No  PCP - Simona Huh, NP Cardiologist - N/A  Chest x-ray - CT chest 10-06-22 Epic EKG - 09-13-22 Epic Stress Test -  N/A ECHO -  N/A Cardiac Cath -  N/A Pacemaker/ICD device last checked: Spinal Cord Stimulator: N/A  Bowel Prep - Yes, patient has instructions  Sleep Study - Yes,  +sleep apnea CPAP - No  Fasting Blood Sugar - N/A Checks Blood Sugar _____ times a day  Last dose of GLP1 agonist-  N/A GLP1 instructions:  N/A   Last dose of SGLT-2 inhibitors-  N/A SGLT-2 instructions: N/A  Blood Thinner Instructions:  N/A Aspirin Instructions: Last Dose:  Activity level:  Can go up a flight of stairs and perform activities of daily living without stopping and without symptoms of chest pain.  Patient states that she has shortness of breath with exertion at times.  States this has improved over the past three months.   Anesthesia review:  COPD,  Hx of PE, OSA.  Shortness of breath with exertion  Patient denies shortness of breath, fever, cough and chest pain at PAT appointment  Patient verbalized understanding of instructions that were given to them at the PAT appointment. Patient was also instructed that they will need to review over the PAT instructions again at home before surgery.

## 2022-10-07 NOTE — Patient Instructions (Addendum)
SURGICAL WAITING ROOM VISITATION Patients having surgery or a procedure may have no more than 2 support people in the waiting area - these visitors may rotate.    If the patient needs to stay at the hospital during part of their recovery, the visitor guidelines for inpatient rooms apply. Pre-op nurse will coordinate an appropriate time for 1 support person to accompany patient in pre-op.  This support person may not rotate.    Please refer to the [facility] website for the visitor guidelines for Inpatients (after your surgery is over and you are in a regular room).   Due to an increase in RSV and influenza rates and associated hospitalizations, children ages 86 and under may not visit patients in [place].     Your procedure is scheduled on: 10-22-22   Report to [facility] Main Entrance    Report to admitting at 5:15 AM   Call this number if you have problems the morning of surgery [phone]   Follow a clear liquid diet the day before surgery to prevent dehydration   Do not eat food :After Midnight.   After Midnight you may have the following liquids until 4:30 AM DAY OF SURGERY  Water Non-Citrus Juices (without pulp, NO RED) Carbonated Beverages Black Coffee (NO MILK/CREAM OR CREAMERS, sugar ok)  Clear Tea (NO MILK/CREAM OR CREAMERS, sugar ok) regular and decaf                             Plain Jell-O (NO RED)                                           Fruit ices (not with fruit pulp, NO RED)                                     Popsicles (NO RED)                                                               Sports drinks like Gatorade (NO RED)  Drink 2 Pre-surgery Ensure the evening before surgery (have completed by 10 PM)                   The day of surgery:  Drink ONE (1) Pre-Surgery Clear Ensure at 4:30 AM the morning of surgery. Drink in one sitting. Do not sip.  This drink was given to you during your hospital  pre-op appointment  visit. Nothing else to drink after completing the Pre-Surgery Clear Ensure .          If you have questions, please contact your surgeon's office.   FOLLOW BOWEL PREP AND ANY ADDITIONAL PRE OP INSTRUCTIONS YOU RECEIVED FROM YOUR SURGEON'S OFFICE!!!  -Clear liquids day of prep to prevent dehydration  - Bisacodyl (Dulcolax) 20 mg - Give with water the day prior to surgery.   - Miralax 255g - Mix with 64 oz Gatorade/Powerade.  Drink gradually over the next few hours (8 oz glass every 15-30 minutes) until gone the day prior to  surgery.   -Metronidazole (Flagyl) 1000 mg - At 2 pm, 3 pm and 10 pm after Miralax bowel prep the day prior to surgery.       -Neomycin 1000 mg - At 2 pm, 3 pm and 10 pm after Miralax  bowel prep the day prior to surgery.    Oral Hygiene is also important to reduce your risk of infection.                                    Remember - BRUSH YOUR TEETH THE MORNING OF SURGERY WITH YOUR REGULAR TOOTHPASTE   Do NOT smoke after Midnight   Take these medicines the morning of surgery with A SIP OF WATER:   Oxycodone  Gabapentin  Omeprazole  Okay to use eyedrops                              You may not have any metal on your body including hair pins, jewelry, and body piercing             Do not wear make-up, lotions, powders, perfumes or deodorant  Do not wear nail polish including gel and S&S, artificial/acrylic nails, or any other type of covering on natural nails including finger and toenails. If you have artificial nails, gel coating, etc. that needs to be removed by a nail salon please have this removed prior to surgery or surgery may need to be canceled/ delayed if the surgeon/ anesthesia feels like they are unable to be safely monitored.   Do not shave  48 hours prior to surgery.        Do not bring valuables to the hospital. [place].   Contacts, dentures or bridgework may not be worn into surgery.   Bring small  overnight bag day of surgery.   DO NOT [place]. PHARMACY WILL DISPENSE MEDICATIONS LISTED ON YOUR MEDICATION LIST TO YOU DURING YOUR ADMISSION [place]!               Please read over the following fact sheets you were given: IF [place]  If you received a COVID test during your pre-op visit  it is requested that you wear a mask when out in public, stay away from anyone that may not be feeling well and notify your surgeon if you develop symptoms. If you test positive for Covid or have been in contact with anyone that has tested positive in the last 10 days please notify you surgeon.  [place] - Preparing for Surgery Before surgery, you can play an important role.  Because skin is not sterile, your skin needs to be as free of germs as possible.  You can reduce the number of germs on your skin by washing with CHG (chlorahexidine gluconate) soap before surgery.  CHG is an antiseptic cleaner which kills germs and bonds with the skin to continue killing germs even after washing. Please DO NOT use if you have an allergy to CHG or antibacterial soaps.  If your skin becomes reddened/irritated stop using the CHG and inform your nurse when you arrive at Short Stay. Do not shave (including legs and underarms) for at least 48 hours prior to the first CHG shower.  You may  shave your face/neck.  Please follow these instructions carefully:  1.  Shower with CHG Soap the night before surgery and the  morning of surgery.  2.  If you choose to wash your hair, wash your hair first as usual with your normal  shampoo.  3.  After you shampoo, rinse your hair and body thoroughly to remove the shampoo.                             4.  Use CHG as you would any other liquid soap.  You can apply chg directly to the skin and wash.  Gently with a scrungie or clean washcloth.  5.  Apply the CHG Soap to your body  ONLY FROM THE NECK DOWN.   Do   not use on face/ open                           Wound or open sores. Avoid contact with eyes, ears mouth and   genitals (private parts).                       Wash face,  Genitals (private parts) with your normal soap.             6.  Wash thoroughly, paying special attention to the area where your    surgery  will be performed.  7.  Thoroughly rinse your body with warm water from the neck down.  8.  DO NOT shower/wash with your normal soap after using and rinsing off the CHG Soap.                9.  Pat yourself dry with a clean towel.            10.  Wear clean pajamas.            11.  Place clean sheets on your bed the night of your first shower and do not  sleep with pets. Day of Surgery : Do not apply any lotions/deodorants the morning of surgery.  Please wear clean clothes to the hospital/surgery center.  FAILURE TO FOLLOW THESE INSTRUCTIONS MAY RESULT IN THE CANCELLATION OF YOUR SURGERY  PATIENT SIGNATURE_________________________________  NURSE SIGNATURE__________________________________  ________________________________________________________________________    Adam [place]  An incentive spirometer is a tool that can help keep your lungs clear and active. This tool measures how well you are filling your lungs with each breath. Taking long deep breaths may help reverse or decrease the chance of developing breathing (pulmonary) problems (especially infection) following: A long period of time when you are unable to move or be active. BEFORE THE PROCEDURE  If the spirometer includes an indicator to show your best effort, your nurse or respiratory therapist will set it to a desired goal. If possible, sit up straight or lean slightly forward. Try not to slouch. Hold the incentive spirometer in an upright position. INSTRUCTIONS FOR USE  Sit on the edge of your bed if possible, or sit up as far as you can in bed or on a chair. Hold the incentive  spirometer in an upright position. Breathe out normally. Place the mouthpiece in your mouth and seal your lips tightly around it. Breathe in slowly and as deeply as possible, raising the piston or the ball toward the top of the column. Hold your breath for 3-5 seconds or for as long as possible.  Allow the piston or ball to fall to the bottom of the column. Remove the mouthpiece from your mouth and breathe out normally. Rest for a few seconds and repeat Steps 1 through 7 at least 10 times every 1-2 hours when you are awake. Take your time and take a few normal breaths between deep breaths. The spirometer may include an indicator to show your best effort. Use the indicator as a goal to work toward during each repetition. After each set of 10 deep breaths, practice coughing to be sure your lungs are clear. If you have an incision (the cut made at the time of surgery), support your incision when coughing by placing a pillow or rolled up towels firmly against it. Once you are able to get out of bed, walk around indoors and cough well. You may stop using the incentive spirometer when instructed by your caregiver.  RISKS AND COMPLICATIONS Take your time so you do not get dizzy or light-headed. If you are in pain, you may need to take or ask for pain medication before doing incentive spirometry. It is harder to take a deep breath if you are having pain. AFTER USE Rest and breathe slowly and easily. It can be helpful to keep track of a log of your progress. Your caregiver can provide you with a simple table to help with this. If you are using the spirometer at home, follow these instructions: [place] IF:  You are having difficultly using the spirometer. You have trouble using the spirometer as often as instructed. Your pain medication is not giving enough relief while using the spirometer. You develop fever of 100.5 F (38.1 C) or higher. SEEK IMMEDIATE MEDICAL CARE IF:  You cough up bloody  sputum that had not been present before. You develop fever of 102 F (38.9 C) or greater. You develop worsening pain at or near the incision site. MAKE SURE YOU:  Understand these instructions. Will watch your condition. Will get help right away if you are not doing well or get worse. Document Released: 12/20/2006 Document Revised: 11/01/2011 Document Reviewed: 02/20/2007 ExitCare Patient Information 2014 [employer], [place].   ________________________________________________________________________ WHAT IS A BLOOD TRANSFUSION? Blood Transfusion Information  A transfusion is the replacement of blood or some of its parts. Blood is made up of multiple cells which provide different functions. Red blood cells carry oxygen and are used for blood loss replacement. White blood cells fight against infection. Platelets control bleeding. Plasma helps clot blood. Other blood products are available for specialized needs, such as hemophilia or other clotting disorders. BEFORE THE TRANSFUSION  Who gives blood for transfusions?  Healthy volunteers who are fully evaluated to make sure their blood is safe. This is blood bank blood. Transfusion therapy is the safest it has ever been in the practice of medicine. Before blood is taken from a donor, a complete history is taken to make sure that person has no history of diseases nor engages in risky social behavior (examples are intravenous drug use or sexual activity with multiple partners). The donor's travel history is screened to minimize risk of transmitting infections, such as malaria. The donated blood is tested for signs of infectious diseases, such as HIV and hepatitis. The blood is then tested to be sure it is compatible with you in order to minimize the chance of a transfusion reaction. If you or a relative donates blood, this is often done in anticipation of surgery and is not appropriate for emergency situations. It takes many  days to process the donated  blood. RISKS AND COMPLICATIONS Although transfusion therapy is very safe and saves many lives, the main dangers of transfusion include:  Getting an infectious disease. Developing a transfusion reaction. This is an allergic reaction to something in the blood you were given. Every precaution is taken to prevent this. The decision to have a blood transfusion has been considered carefully by your caregiver before blood is given. Blood is not given unless the benefits outweigh the risks. AFTER THE TRANSFUSION Right after receiving a blood transfusion, you will usually feel much better and more energetic. This is especially true if your red blood cells have gotten low (anemic). The transfusion raises the level of the red blood cells which carry oxygen, and this usually causes an energy increase. The nurse administering the transfusion will monitor you carefully for complications. HOME CARE INSTRUCTIONS  No special instructions are needed after a transfusion. You may find your energy is better. Speak with your caregiver about any limitations on activity for underlying diseases you may have. SEEK MEDICAL CARE IF:  Your condition is not improving after your transfusion. You develop redness or irritation at the intravenous (IV) site. SEEK IMMEDIATE MEDICAL CARE IF:  Any of the following symptoms occur over the next 12 hours: Shaking chills. You have a temperature by mouth above 102 F (38.9 C), not controlled by medicine. Chest, back, or muscle pain. People around you feel you are not acting correctly or are confused. Shortness of breath or difficulty breathing. Dizziness and fainting. You get a rash or develop hives. You have a decrease in urine output. Your urine turns a dark color or changes to pink, red, or brown. Any of the following symptoms occur over the next 10 days: You have a temperature by mouth above 102 F (38.9 C), not controlled by medicine. Shortness of breath. Weakness after  normal activity. The white part of the eye turns yellow (jaundice). You have a decrease in the amount of urine or are urinating less often. Your urine turns a dark color or changes to pink, red, or brown. Document Released: 08/06/2000 Document Revised: 11/01/2011 Document Reviewed: 03/25/2008 [facility] Patient Information 2014 [place], [place].  _______________________________________________________________________

## 2022-10-11 ENCOUNTER — Encounter (HOSPITAL_COMMUNITY): Payer: Self-pay

## 2022-10-11 ENCOUNTER — Encounter (HOSPITAL_COMMUNITY)
Admission: RE | Admit: 2022-10-11 | Discharge: 2022-10-11 | Disposition: A | Discharge status: Home / Self Care | Payer: Medicaid Other | Source: Ambulatory Visit | Attending: Surgery | Admitting: Surgery

## 2022-10-11 ENCOUNTER — Other Ambulatory Visit: Payer: Self-pay

## 2022-10-11 VITALS — BP 120/84 | HR 64 | Temp 98.3°F | Resp 12 | Ht 62.0 in | Wt 99.4 lb

## 2022-10-11 DIAGNOSIS — Z01812 Encounter for preprocedural laboratory examination: Secondary | ICD-10-CM | POA: Insufficient documentation

## 2022-10-11 DIAGNOSIS — R739 Hyperglycemia, unspecified: Secondary | ICD-10-CM | POA: Diagnosis not present

## 2022-10-11 DIAGNOSIS — Z01818 Encounter for other preprocedural examination: Secondary | ICD-10-CM

## 2022-10-11 DIAGNOSIS — J984 Other disorders of lung: Secondary | ICD-10-CM | POA: Diagnosis not present

## 2022-10-11 HISTORY — DX: Gastro-esophageal reflux disease without esophagitis: K21.9

## 2022-10-11 HISTORY — DX: Essential (primary) hypertension: I10

## 2022-10-11 HISTORY — DX: Unspecified osteoarthritis, unspecified site: M19.90

## 2022-10-11 HISTORY — DX: Sleep apnea, unspecified: G47.30

## 2022-10-11 HISTORY — DX: Malignant neoplasm of hepatic flexure: C18.3

## 2022-10-11 HISTORY — DX: Anemia, unspecified: D64.9

## 2022-10-11 LAB — BASIC METABOLIC PANEL
Anion gap: 5 (ref 5–15)
BUN: 12 mg/dL (ref 6–20)
CO2: 28 mmol/L (ref 22–32)
Calcium: 8.5 mg/dL — ABNORMAL LOW (ref 8.9–10.3)
Chloride: 108 mmol/L (ref 98–111)
Creatinine, Ser: 0.72 mg/dL (ref 0.44–1.00)
GFR, Estimated: >60 mL/min (ref >60)
Glucose, Bld: 80 mg/dL (ref 70–99)
Potassium: 3.6 mmol/L (ref 3.5–5.1)
Sodium: 141 mmol/L (ref 135–145)

## 2022-10-11 LAB — CBC
HCT: 38 % (ref 36.0–46.0)
Hemoglobin: 12.7 g/dL (ref 12.0–15.0)
MCH: 31.2 pg (ref 26.0–34.0)
MCHC: 33.4 g/dL (ref 30.0–36.0)
MCV: 93.4 fL (ref 80.0–100.0)
Platelets: 225 10*3/uL (ref 150–400)
RBC: 4.07 MIL/uL (ref 3.87–5.11)
RDW: 14.1 % (ref 11.5–15.5)
WBC: 7.3 10*3/uL (ref 4.0–10.5)
nRBC: 0 % (ref 0.0–0.2)

## 2022-10-11 LAB — TYPE AND SCREEN
ABO/RH(D): A POS
Antibody Screen: NEGATIVE

## 2022-10-11 LAB — HEMOGLOBIN A1C
Hgb A1c MFr Bld: 5.1 % (ref 4.8–5.6)
Mean Plasma Glucose: 99.67 mg/dL

## 2022-10-11 NOTE — Consult Note (Signed)
[place] Nurse requested for preoperative stoma site marking  Discussed surgical procedure and stoma creation with patient and family.  Explained role of the [place] nurse team.  Provided the patient with educational booklet and provided samples of pouching options.  Answered patient and family questions.   Examined patient sitting, and standing in order to place the marking in the patient's visual field, away from any creases or abdominal contour issues and within the rectus muscle.  Attempted to mark below the patient's belt line.   Marked for colostomy in the LLQ  _1cm ___ cm to the left of the umbilicus and [id number] below the umbilicus.  Marked for ileostomy in the RLQ  _1___cm to the right of the umbilicus and  [id number] cm below the umbilicus. Patient is very small with limited positioning within rectus muscle.   Patient's abdomen cleansed with CHG wipes at site markings, allowed to air dry prior to marking.Covered mark with thin film transparent dressing to preserve mark until date of surgery.  Patients surgery date is 3/1, educated them on making sure the marks stay in place until DOS.   [place] Nurse team will follow up with patient after surgery for continue ostomy care and teaching.  [place] MSN, [place], [place], [place]

## 2022-10-13 DIAGNOSIS — Z79899 Other long term (current) drug therapy: Secondary | ICD-10-CM | POA: Diagnosis not present

## 2022-10-19 DIAGNOSIS — Z79899 Other long term (current) drug therapy: Secondary | ICD-10-CM | POA: Diagnosis not present

## 2022-10-21 NOTE — Anesthesia Preprocedure Evaluation (Addendum)
Anesthesia Evaluation  Patient identified by MRN, date of birth, ID band Patient awake    Reviewed: Allergy & Precautions, NPO status , Patient's Chart, lab work & pertinent test results  Airway Mallampati: I  TM Distance: >3 FB Neck ROM: Full    Dental no notable dental hx. (+) Edentulous Upper, Edentulous Lower   Pulmonary sleep apnea , COPD, Current Smoker and Patient abstained from smoking., PE (hx)   Pulmonary exam normal breath sounds clear to auscultation       Cardiovascular hypertension, Normal cardiovascular exam Rhythm:Regular Rate:Normal     Neuro/Psych    GI/Hepatic ,GERD  ,,(+)     substance abuse (hx of in past, distant cocaine hx 15 years, etoh 7 months)  alcohol use and cocaine use  Endo/Other    Renal/GU Lab Results      Component                Value               Date                      CREATININE               0.72                10/11/2022                  K                        3.6                 10/11/2022                     Musculoskeletal  (+) Arthritis ,    Abdominal   Peds  Hematology Lab Results      Component                Value               Date                      WBC                      7.3                 10/11/2022                HGB                      12.7                10/11/2022                HCT                      38.0                10/11/2022                       PLT                      225                 10/11/2022  T&S available   Anesthesia Other Findings   Reproductive/Obstetrics                             Anesthesia Physical Anesthesia Plan  ASA: 3  Anesthesia Plan: General   Post-op Pain Management: Ketamine IV*, Ofirmev IV (intra-op)* and Lidocaine infusion*   Induction: Intravenous  PONV Risk Score and Plan: 3 and Ondansetron, Midazolam and Treatment may vary due to age or medical condition  Airway  Management Planned: Oral ETT  Additional Equipment: None  Intra-op Plan:   Post-operative Plan: Extubation in OR  Informed Consent: I have reviewed the patients History and Physical, chart, labs and discussed the procedure including the risks, benefits and alternatives for the proposed anesthesia with the patient or authorized representative who has indicated his/her understanding and acceptance.     Dental advisory given  Plan Discussed with: CRNA and Anesthesiologist  Anesthesia Plan Comments:         Anesthesia Quick Evaluation

## 2022-10-22 ENCOUNTER — Inpatient Hospital Stay (HOSPITAL_COMMUNITY)
Admission: RE | Admit: 2022-10-22 | Discharge: 2022-10-24 | DRG: 331 | Disposition: A | Discharge status: Home / Self Care | Payer: Medicaid Other | Source: Ambulatory Visit | Attending: Surgery | Admitting: Surgery

## 2022-10-22 ENCOUNTER — Other Ambulatory Visit: Payer: Self-pay

## 2022-10-22 ENCOUNTER — Inpatient Hospital Stay (HOSPITAL_COMMUNITY): Payer: Medicaid Other | Admitting: Physician Assistant

## 2022-10-22 ENCOUNTER — Encounter (HOSPITAL_COMMUNITY)
Admission: RE | Disposition: A | Discharge status: Home / Self Care | Payer: Self-pay | Source: Ambulatory Visit | Attending: Surgery

## 2022-10-22 ENCOUNTER — Encounter (HOSPITAL_COMMUNITY): Payer: Self-pay | Admitting: Surgery

## 2022-10-22 ENCOUNTER — Inpatient Hospital Stay (HOSPITAL_COMMUNITY): Payer: Medicaid Other | Admitting: Certified Registered Nurse Anesthetist

## 2022-10-22 DIAGNOSIS — I10 Essential (primary) hypertension: Secondary | ICD-10-CM | POA: Diagnosis present

## 2022-10-22 DIAGNOSIS — Z8249 Family history of ischemic heart disease and other diseases of the circulatory system: Secondary | ICD-10-CM | POA: Diagnosis not present

## 2022-10-22 DIAGNOSIS — E785 Hyperlipidemia, unspecified: Secondary | ICD-10-CM | POA: Diagnosis present

## 2022-10-22 DIAGNOSIS — F5102 Adjustment insomnia: Secondary | ICD-10-CM | POA: Diagnosis present

## 2022-10-22 DIAGNOSIS — K6389 Other specified diseases of intestine: Secondary | ICD-10-CM | POA: Diagnosis present

## 2022-10-22 DIAGNOSIS — F1290 Cannabis use, unspecified, uncomplicated: Secondary | ICD-10-CM | POA: Diagnosis present

## 2022-10-22 DIAGNOSIS — K5909 Other constipation: Secondary | ICD-10-CM | POA: Diagnosis present

## 2022-10-22 DIAGNOSIS — Z86711 Personal history of pulmonary embolism: Secondary | ICD-10-CM

## 2022-10-22 DIAGNOSIS — Z86718 Personal history of other venous thrombosis and embolism: Secondary | ICD-10-CM | POA: Diagnosis not present

## 2022-10-22 DIAGNOSIS — C182 Malignant neoplasm of ascending colon: Secondary | ICD-10-CM | POA: Diagnosis not present

## 2022-10-22 DIAGNOSIS — J449 Chronic obstructive pulmonary disease, unspecified: Secondary | ICD-10-CM | POA: Diagnosis present

## 2022-10-22 DIAGNOSIS — C184 Malignant neoplasm of transverse colon: Secondary | ICD-10-CM | POA: Diagnosis not present

## 2022-10-22 DIAGNOSIS — G894 Chronic pain syndrome: Secondary | ICD-10-CM | POA: Diagnosis present

## 2022-10-22 DIAGNOSIS — F17210 Nicotine dependence, cigarettes, uncomplicated: Secondary | ICD-10-CM | POA: Diagnosis present

## 2022-10-22 DIAGNOSIS — K219 Gastro-esophageal reflux disease without esophagitis: Secondary | ICD-10-CM | POA: Diagnosis present

## 2022-10-22 DIAGNOSIS — F419 Anxiety disorder, unspecified: Secondary | ICD-10-CM | POA: Diagnosis present

## 2022-10-22 DIAGNOSIS — Z79899 Other long term (current) drug therapy: Secondary | ICD-10-CM | POA: Diagnosis not present

## 2022-10-22 DIAGNOSIS — C183 Malignant neoplasm of hepatic flexure: Secondary | ICD-10-CM | POA: Diagnosis not present

## 2022-10-22 DIAGNOSIS — Z720 Tobacco use: Secondary | ICD-10-CM | POA: Diagnosis present

## 2022-10-22 DIAGNOSIS — K660 Peritoneal adhesions (postprocedural) (postinfection): Secondary | ICD-10-CM | POA: Diagnosis present

## 2022-10-22 DIAGNOSIS — Z83438 Family history of other disorder of lipoprotein metabolism and other lipidemia: Secondary | ICD-10-CM | POA: Diagnosis not present

## 2022-10-22 LAB — ABO/RH: ABO/RH(D): A POS

## 2022-10-22 SURGERY — COLECTOMY, PARTIAL, ROBOT-ASSISTED, LAPAROSCOPIC
Anesthesia: General

## 2022-10-22 MED ORDER — SODIUM CHLORIDE 0.9 % IV SOLN
2.0000 g | INTRAVENOUS | Status: AC
  Administered 2022-10-22: 2 g via INTRAVENOUS
  Filled 2022-10-22: qty 2

## 2022-10-22 MED ORDER — NEOMYCIN SULFATE 500 MG PO TABS: 1000.0000 mg | ORAL_TABLET | ORAL | Status: DC

## 2022-10-22 MED ORDER — FENTANYL CITRATE (PF) 100 MCG/2ML IJ SOLN
INTRAMUSCULAR | Status: DC | PRN
  Administered 2022-10-22 (×4): 50 ug via INTRAVENOUS

## 2022-10-22 MED ORDER — LACTATED RINGERS IR SOLN
Status: DC | PRN
  Administered 2022-10-22: 1000 mL

## 2022-10-22 MED ORDER — LACTATED RINGERS IV BOLUS: 1000.0000 mL | Freq: Three times a day (TID) | INTRAVENOUS | Status: DC | PRN

## 2022-10-22 MED ORDER — ORAL CARE MOUTH RINSE: 15.0000 mL | Freq: Once | OROMUCOSAL | Status: AC

## 2022-10-22 MED ORDER — LIDOCAINE 20MG/ML (2%) 15 ML SYRINGE OPTIME
INTRAMUSCULAR | Status: DC | PRN
  Administered 2022-10-22: 1.5 mg/kg/h via INTRAVENOUS

## 2022-10-22 MED ORDER — DEXAMETHASONE SODIUM PHOSPHATE 10 MG/ML IJ SOLN
INTRAMUSCULAR | Status: DC | PRN
  Administered 2022-10-22: 4 mg via INTRAVENOUS

## 2022-10-22 MED ORDER — DIPHENHYDRAMINE HCL 12.5 MG/5ML PO ELIX: 12.5000 mg | ORAL_SOLUTION | Freq: Four times a day (QID) | ORAL | Status: DC | PRN

## 2022-10-22 MED ORDER — ONDANSETRON HCL 4 MG/2ML IJ SOLN: 4.0000 mg | Freq: Four times a day (QID) | INTRAMUSCULAR | Status: DC | PRN

## 2022-10-22 MED ORDER — KETAMINE HCL 10 MG/ML IJ SOLN
INTRAMUSCULAR | Status: AC
  Filled 2022-10-22: qty 1

## 2022-10-22 MED ORDER — ALVIMOPAN 12 MG PO CAPS
12.0000 mg | ORAL_CAPSULE | Freq: Two times a day (BID) | ORAL | Status: DC
  Administered 2022-10-23: 12 mg via ORAL
  Filled 2022-10-22: qty 1

## 2022-10-22 MED ORDER — ENOXAPARIN SODIUM 40 MG/0.4ML IJ SOSY
40.0000 mg | PREFILLED_SYRINGE | INTRAMUSCULAR | Status: DC
  Administered 2022-10-23 – 2022-10-24 (×2): 40 mg via SUBCUTANEOUS
  Filled 2022-10-22 (×2): qty 0.4

## 2022-10-22 MED ORDER — PHENYLEPHRINE 80 MCG/ML (10ML) SYRINGE FOR IV PUSH (FOR BLOOD PRESSURE SUPPORT)
PREFILLED_SYRINGE | INTRAVENOUS | Status: DC | PRN
  Administered 2022-10-22 (×2): 80 ug via INTRAVENOUS

## 2022-10-22 MED ORDER — ALUM & MAG HYDROXIDE-SIMETH 200-200-20 MG/5ML PO SUSP: 30.0000 mL | Freq: Four times a day (QID) | ORAL | Status: DC | PRN

## 2022-10-22 MED ORDER — LIDOCAINE 2% (20 MG/ML) 5 ML SYRINGE
INTRAMUSCULAR | Status: DC | PRN
  Administered 2022-10-22: 40 mg via INTRAVENOUS

## 2022-10-22 MED ORDER — HYDRALAZINE HCL 20 MG/ML IJ SOLN: 10.0000 mg | INTRAMUSCULAR | Status: DC | PRN

## 2022-10-22 MED ORDER — DIPHENHYDRAMINE HCL 50 MG/ML IJ SOLN: 12.5000 mg | Freq: Four times a day (QID) | INTRAMUSCULAR | Status: DC | PRN

## 2022-10-22 MED ORDER — SUGAMMADEX SODIUM 200 MG/2ML IV SOLN
INTRAVENOUS | Status: DC | PRN
  Administered 2022-10-22: 180.4 mg via INTRAVENOUS

## 2022-10-22 MED ORDER — BUPIVACAINE-EPINEPHRINE (PF) 0.25% -1:200000 IJ SOLN
INTRAMUSCULAR | Status: AC
  Filled 2022-10-22: qty 60

## 2022-10-22 MED ORDER — AMITRIPTYLINE HCL 25 MG PO TABS
25.0000 mg | ORAL_TABLET | Freq: Every day | ORAL | Status: DC
  Administered 2022-10-22 – 2022-10-23 (×2): 25 mg via ORAL
  Filled 2022-10-22 (×2): qty 1

## 2022-10-22 MED ORDER — ENSURE PRE-SURGERY PO LIQD: 592.0000 mL | Freq: Once | ORAL | Status: DC

## 2022-10-22 MED ORDER — MAGIC MOUTHWASH: 15.0000 mL | Freq: Four times a day (QID) | ORAL | Status: DC | PRN

## 2022-10-22 MED ORDER — MIDAZOLAM HCL 2 MG/2ML IJ SOLN
INTRAMUSCULAR | Status: AC
  Filled 2022-10-22: qty 2

## 2022-10-22 MED ORDER — LACTATED RINGERS IV SOLN: INTRAVENOUS | Status: DC | PRN

## 2022-10-22 MED ORDER — ALVIMOPAN 12 MG PO CAPS
12.0000 mg | ORAL_CAPSULE | ORAL | Status: AC
  Administered 2022-10-22: 12 mg via ORAL
  Filled 2022-10-22: qty 1

## 2022-10-22 MED ORDER — KETOROLAC TROMETHAMINE 30 MG/ML IJ SOLN: 15.0000 mg | Freq: Once | INTRAMUSCULAR | Status: DC | PRN

## 2022-10-22 MED ORDER — PROCHLORPERAZINE EDISYLATE 10 MG/2ML IJ SOLN: 5.0000 mg | Freq: Four times a day (QID) | INTRAMUSCULAR | Status: DC | PRN

## 2022-10-22 MED ORDER — SODIUM CHLORIDE 0.9 % IV SOLN
2.0000 g | Freq: Two times a day (BID) | INTRAVENOUS | Status: AC
  Administered 2022-10-22: 2 g via INTRAVENOUS
  Filled 2022-10-22: qty 2

## 2022-10-22 MED ORDER — ONDANSETRON HCL 4 MG/2ML IJ SOLN
INTRAMUSCULAR | Status: DC | PRN
  Administered 2022-10-22: 4 mg via INTRAVENOUS

## 2022-10-22 MED ORDER — LIDOCAINE HCL 2 % IJ SOLN
INTRAMUSCULAR | Status: AC
  Filled 2022-10-22: qty 20

## 2022-10-22 MED ORDER — BUPIVACAINE LIPOSOME 1.3 % IJ SUSP: 20.0000 mL | Freq: Once | INTRAMUSCULAR | Status: DC

## 2022-10-22 MED ORDER — FENTANYL CITRATE (PF) 250 MCG/5ML IJ SOLN
INTRAMUSCULAR | Status: AC
  Filled 2022-10-22: qty 5

## 2022-10-22 MED ORDER — OXYCODONE HCL 5 MG/5ML PO SOLN: 5.0000 mg | Freq: Once | ORAL | Status: DC | PRN

## 2022-10-22 MED ORDER — ENSURE SURGERY PO LIQD
237.0000 mL | Freq: Two times a day (BID) | ORAL | Status: DC
  Administered 2022-10-22 – 2022-10-23 (×2): 237 mL via ORAL

## 2022-10-22 MED ORDER — CHLORHEXIDINE GLUCONATE 0.12 % MT SOLN
15.0000 mL | Freq: Once | OROMUCOSAL | Status: AC
  Administered 2022-10-22: 15 mL via OROMUCOSAL

## 2022-10-22 MED ORDER — FENTANYL CITRATE PF 50 MCG/ML IJ SOSY: 25.0000 ug | PREFILLED_SYRINGE | INTRAMUSCULAR | Status: DC | PRN

## 2022-10-22 MED ORDER — LACTATED RINGERS IV SOLN: INTRAVENOUS | Status: DC

## 2022-10-22 MED ORDER — HYDROMORPHONE HCL 1 MG/ML IJ SOLN
0.5000 mg | INTRAMUSCULAR | Status: DC | PRN
  Administered 2022-10-22 – 2022-10-24 (×3): 1 mg via INTRAVENOUS
  Filled 2022-10-22 (×3): qty 1

## 2022-10-22 MED ORDER — PHENOL 1.4 % MT LIQD: 2.0000 | OROMUCOSAL | Status: DC | PRN

## 2022-10-22 MED ORDER — 0.9 % SODIUM CHLORIDE (POUR BTL) OPTIME
TOPICAL | Status: DC | PRN
  Administered 2022-10-22: 2000 mL

## 2022-10-22 MED ORDER — GABAPENTIN 300 MG PO CAPS
300.0000 mg | ORAL_CAPSULE | ORAL | Status: AC
  Administered 2022-10-22: 300 mg via ORAL
  Filled 2022-10-22: qty 1

## 2022-10-22 MED ORDER — MIDAZOLAM HCL 5 MG/5ML IJ SOLN
INTRAMUSCULAR | Status: DC | PRN
  Administered 2022-10-22 (×2): 1 mg via INTRAVENOUS

## 2022-10-22 MED ORDER — DEXAMETHASONE SODIUM PHOSPHATE 10 MG/ML IJ SOLN
INTRAMUSCULAR | Status: AC
  Filled 2022-10-22: qty 1

## 2022-10-22 MED ORDER — LACTATED RINGERS IV SOLN: INTRAVENOUS | Status: AC

## 2022-10-22 MED ORDER — PANTOPRAZOLE SODIUM 40 MG PO TBEC
80.0000 mg | DELAYED_RELEASE_TABLET | Freq: Every day | ORAL | Status: DC
  Administered 2022-10-22 – 2022-10-23 (×2): 80 mg via ORAL
  Filled 2022-10-22 (×3): qty 2

## 2022-10-22 MED ORDER — TIZANIDINE HCL 4 MG PO TABS: 4.0000 mg | ORAL_TABLET | Freq: Three times a day (TID) | ORAL | Status: DC | PRN

## 2022-10-22 MED ORDER — ENSURE PRE-SURGERY PO LIQD: 296.0000 mL | Freq: Once | ORAL | Status: DC

## 2022-10-22 MED ORDER — OXYCODONE HCL 5 MG PO TABS
5.0000 mg | ORAL_TABLET | ORAL | Status: DC | PRN
  Administered 2022-10-22 – 2022-10-23 (×2): 10 mg via ORAL
  Administered 2022-10-23: 5 mg via ORAL
  Administered 2022-10-23 – 2022-10-24 (×2): 10 mg via ORAL
  Filled 2022-10-22 (×6): qty 2

## 2022-10-22 MED ORDER — MENTHOL 3 MG MT LOZG: 1.0000 | LOZENGE | OROMUCOSAL | Status: DC | PRN

## 2022-10-22 MED ORDER — ROCURONIUM BROMIDE 10 MG/ML (PF) SYRINGE
PREFILLED_SYRINGE | INTRAVENOUS | Status: DC | PRN
  Administered 2022-10-22: 35 mg via INTRAVENOUS
  Administered 2022-10-22: 15 mg via INTRAVENOUS
  Administered 2022-10-22: 20 mg via INTRAVENOUS
  Administered 2022-10-22: 10 mg via INTRAVENOUS

## 2022-10-22 MED ORDER — OXYCODONE HCL 5 MG PO TABS: 5.0000 mg | ORAL_TABLET | Freq: Once | ORAL | Status: DC | PRN

## 2022-10-22 MED ORDER — PROPOFOL 10 MG/ML IV BOLUS
INTRAVENOUS | Status: AC
  Filled 2022-10-22: qty 20

## 2022-10-22 MED ORDER — ACETAMINOPHEN 500 MG PO TABS
1000.0000 mg | ORAL_TABLET | ORAL | Status: AC
  Administered 2022-10-22: 1000 mg via ORAL
  Filled 2022-10-22: qty 2

## 2022-10-22 MED ORDER — ENOXAPARIN SODIUM 40 MG/0.4ML IJ SOSY
40.0000 mg | PREFILLED_SYRINGE | Freq: Once | INTRAMUSCULAR | Status: AC
  Administered 2022-10-22: 40 mg via SUBCUTANEOUS
  Filled 2022-10-22: qty 0.4

## 2022-10-22 MED ORDER — KETAMINE HCL 10 MG/ML IJ SOLN
INTRAMUSCULAR | Status: DC | PRN
  Administered 2022-10-22: 10 mg via INTRAVENOUS
  Administered 2022-10-22: 20 mg via INTRAVENOUS

## 2022-10-22 MED ORDER — BUPIVACAINE-EPINEPHRINE (PF) 0.25% -1:200000 IJ SOLN
INTRAMUSCULAR | Status: DC | PRN
  Administered 2022-10-22: 30 mL

## 2022-10-22 MED ORDER — MIRTAZAPINE 15 MG PO TABS
30.0000 mg | ORAL_TABLET | Freq: Every day | ORAL | Status: DC
  Administered 2022-10-22 – 2022-10-23 (×2): 30 mg via ORAL
  Filled 2022-10-22 (×2): qty 2

## 2022-10-22 MED ORDER — METRONIDAZOLE 500 MG PO TABS: 1000.0000 mg | ORAL_TABLET | ORAL | Status: DC

## 2022-10-22 MED ORDER — PROCHLORPERAZINE MALEATE 10 MG PO TABS: 10.0000 mg | ORAL_TABLET | Freq: Four times a day (QID) | ORAL | Status: DC | PRN

## 2022-10-22 MED ORDER — ONDANSETRON HCL 4 MG PO TABS: 4.0000 mg | ORAL_TABLET | Freq: Four times a day (QID) | ORAL | Status: DC | PRN

## 2022-10-22 MED ORDER — ROCURONIUM BROMIDE 10 MG/ML (PF) SYRINGE
PREFILLED_SYRINGE | INTRAVENOUS | Status: AC
  Filled 2022-10-22: qty 10

## 2022-10-22 MED ORDER — BUPIVACAINE LIPOSOME 1.3 % IJ SUSP
INTRAMUSCULAR | Status: AC
  Filled 2022-10-22: qty 20

## 2022-10-22 MED ORDER — METOPROLOL TARTRATE 5 MG/5ML IV SOLN: 5.0000 mg | Freq: Four times a day (QID) | INTRAVENOUS | Status: DC | PRN

## 2022-10-22 MED ORDER — BISACODYL 5 MG PO TBEC: 20.0000 mg | DELAYED_RELEASE_TABLET | Freq: Once | ORAL | Status: DC

## 2022-10-22 MED ORDER — MELATONIN 3 MG PO TABS
3.0000 mg | ORAL_TABLET | Freq: Every evening | ORAL | Status: DC | PRN
  Filled 2022-10-22: qty 1

## 2022-10-22 MED ORDER — ONDANSETRON HCL 4 MG/2ML IJ SOLN: 4.0000 mg | Freq: Once | INTRAMUSCULAR | Status: DC | PRN

## 2022-10-22 MED ORDER — SIMETHICONE 40 MG/0.6ML PO SUSP: 80.0000 mg | Freq: Four times a day (QID) | ORAL | Status: DC | PRN

## 2022-10-22 MED ORDER — ONDANSETRON HCL 4 MG/2ML IJ SOLN
INTRAMUSCULAR | Status: AC
  Filled 2022-10-22: qty 2

## 2022-10-22 MED ORDER — CALCIUM POLYCARBOPHIL 625 MG PO TABS
625.0000 mg | ORAL_TABLET | Freq: Two times a day (BID) | ORAL | Status: DC
  Administered 2022-10-22 – 2022-10-23 (×4): 625 mg via ORAL
  Filled 2022-10-22 (×4): qty 1

## 2022-10-22 MED ORDER — ACETAMINOPHEN 500 MG PO TABS
1000.0000 mg | ORAL_TABLET | Freq: Four times a day (QID) | ORAL | Status: DC
  Administered 2022-10-22 – 2022-10-24 (×7): 1000 mg via ORAL
  Filled 2022-10-22 (×8): qty 2

## 2022-10-22 MED ORDER — PROPOFOL 10 MG/ML IV BOLUS
INTRAVENOUS | Status: DC | PRN
  Administered 2022-10-22: 110 mg via INTRAVENOUS

## 2022-10-22 MED ORDER — QUETIAPINE FUMARATE 50 MG PO TABS
200.0000 mg | ORAL_TABLET | Freq: Every day | ORAL | Status: DC
  Administered 2022-10-22 – 2022-10-23 (×2): 200 mg via ORAL
  Filled 2022-10-22 (×2): qty 4

## 2022-10-22 MED ORDER — POLYETHYLENE GLYCOL 3350 17 GM/SCOOP PO POWD: 1.0000 | Freq: Once | ORAL | Status: DC

## 2022-10-22 MED ORDER — PHENYLEPHRINE HCL-NACL 20-0.9 MG/250ML-% IV SOLN
INTRAVENOUS | Status: AC
  Filled 2022-10-22: qty 250

## 2022-10-22 MED ORDER — BUPIVACAINE LIPOSOME 1.3 % IJ SUSP
INTRAMUSCULAR | Status: DC | PRN
  Administered 2022-10-22: 20 mL

## 2022-10-22 MED ORDER — PHENYLEPHRINE 80 MCG/ML (10ML) SYRINGE FOR IV PUSH (FOR BLOOD PRESSURE SUPPORT)
PREFILLED_SYRINGE | INTRAVENOUS | Status: AC
  Filled 2022-10-22: qty 10

## 2022-10-22 MED ORDER — LIP MEDEX EX OINT
TOPICAL_OINTMENT | Freq: Two times a day (BID) | CUTANEOUS | Status: DC
  Filled 2022-10-22 (×2): qty 7

## 2022-10-22 MED ORDER — LIDOCAINE HCL (PF) 2 % IJ SOLN
INTRAMUSCULAR | Status: AC
  Filled 2022-10-22: qty 5

## 2022-10-22 MED ORDER — GABAPENTIN 400 MG PO CAPS: 800.0000 mg | ORAL_CAPSULE | Freq: Three times a day (TID) | ORAL | Status: DC | PRN

## 2022-10-22 SURGICAL SUPPLY — 122 items
APL PRP STRL LF DISP 70% ISPRP (MISCELLANEOUS) ×1
APPLIER CLIP 5 13 M/L LIGAMAX5 (MISCELLANEOUS)
APPLIER CLIP ROT 10 11.4 M/L (STAPLE)
APR CLP MED LRG 11.4X10 (STAPLE)
APR CLP MED LRG 5 ANG JAW (MISCELLANEOUS)
BAG COUNTER SPONGE SURGICOUNT (BAG) ×2 IMPLANT
BAG SPNG CNTER NS LX DISP (BAG) ×1
BLADE EXTENDED COATED 6.5IN (ELECTRODE) IMPLANT
CANNULA REDUC XI 12-8 STAPL (CANNULA) ×1
CANNULA REDUCER 12-8 DVNC XI (CANNULA) IMPLANT
CELLS DAT CNTRL 66122 CELL SVR (MISCELLANEOUS) ×1 IMPLANT
CHLORAPREP W/TINT 26 (MISCELLANEOUS) IMPLANT
CLIP APPLIE 5 13 M/L LIGAMAX5 (MISCELLANEOUS) IMPLANT
CLIP APPLIE ROT 10 11.4 M/L (STAPLE) IMPLANT
COVER SURGICAL LIGHT HANDLE (MISCELLANEOUS) ×4 IMPLANT
COVER TIP SHEARS 8 DVNC (MISCELLANEOUS) ×2 IMPLANT
COVER TIP SHEARS 8MM DA VINCI (MISCELLANEOUS) ×1
DEVICE TROCAR PUNCTURE CLOSURE (ENDOMECHANICALS) IMPLANT
DRAIN CHANNEL 19F RND (DRAIN) IMPLANT
DRAPE ARM DVNC X/XI (DISPOSABLE) ×8 IMPLANT
DRAPE COLUMN DVNC XI (DISPOSABLE) ×2 IMPLANT
DRAPE DA VINCI XI ARM (DISPOSABLE) ×4
DRAPE DA VINCI XI COLUMN (DISPOSABLE) ×1
DRAPE SURG IRRIG POUCH 19X23 (DRAPES) ×2 IMPLANT
DRSG OPSITE POSTOP 4X10 (GAUZE/BANDAGES/DRESSINGS) IMPLANT
DRSG OPSITE POSTOP 4X6 (GAUZE/BANDAGES/DRESSINGS) IMPLANT
DRSG OPSITE POSTOP 4X8 (GAUZE/BANDAGES/DRESSINGS) IMPLANT
DRSG TEGADERM 2-3/8X2-3/4 SM (GAUZE/BANDAGES/DRESSINGS) ×10 IMPLANT
DRSG TEGADERM 4X4.75 (GAUZE/BANDAGES/DRESSINGS) IMPLANT
ELECT PENCIL ROCKER SW 15FT (MISCELLANEOUS) ×2 IMPLANT
ELECT REM PT RETURN 15FT ADLT (MISCELLANEOUS) ×2 IMPLANT
ENDOLOOP SUT PDS II  0 18 (SUTURE)
ENDOLOOP SUT PDS II 0 18 (SUTURE) IMPLANT
EVACUATOR SILICONE 100CC (DRAIN) IMPLANT
GAUZE SPONGE 2X2 STRL 8-PLY (GAUZE/BANDAGES/DRESSINGS) ×2 IMPLANT
GLOVE ECLIPSE 8.0 STRL XLNG CF (GLOVE) ×6 IMPLANT
GLOVE INDICATOR 8.0 STRL GRN (GLOVE) ×6 IMPLANT
GOWN SRG XL LVL 4 BRTHBL STRL (GOWNS) ×2 IMPLANT
GOWN STRL NON-REIN XL LVL4 (GOWNS) ×1
GOWN STRL REUS W/ TWL XL LVL3 (GOWN DISPOSABLE) ×8 IMPLANT
GOWN STRL REUS W/TWL XL LVL3 (GOWN DISPOSABLE) ×4
GRASPER SUT TROCAR 14GX15 (MISCELLANEOUS) IMPLANT
HOLDER FOLEY CATH W/STRAP (MISCELLANEOUS) ×2 IMPLANT
IRRIG SUCT STRYKERFLOW 2 WTIP (MISCELLANEOUS) ×1
IRRIGATION SUCT STRKRFLW 2 WTP (MISCELLANEOUS) ×2 IMPLANT
KIT PROCEDURE DA VINCI SI (MISCELLANEOUS)
KIT PROCEDURE DVNC SI (MISCELLANEOUS) IMPLANT
KIT SIGMOIDOSCOPE (SET/KITS/TRAYS/PACK) IMPLANT
KIT TURNOVER KIT A (KITS) IMPLANT
NDL INSUFFLATION 14GA 120MM (NEEDLE) ×2 IMPLANT
NEEDLE INSUFFLATION 14GA 120MM (NEEDLE) ×1 IMPLANT
PACK CARDIOVASCULAR III (CUSTOM PROCEDURE TRAY) ×2 IMPLANT
PACK COLON (CUSTOM PROCEDURE TRAY) ×2 IMPLANT
PAD POSITIONING PINK XL (MISCELLANEOUS) ×2 IMPLANT
PROTECTOR NERVE ULNAR (MISCELLANEOUS) ×4 IMPLANT
RELOAD STAPLE 45 3.5 BLU DVNC (STAPLE) IMPLANT
RELOAD STAPLE 45 4.3 GRN DVNC (STAPLE) IMPLANT
RELOAD STAPLE 60 2.5 WHT DVNC (STAPLE) IMPLANT
RELOAD STAPLE 60 3.5 BLU DVNC (STAPLE) IMPLANT
RELOAD STAPLE 60 4.3 GRN DVNC (STAPLE) IMPLANT
RELOAD STAPLER 2.5X60 WHT DVNC (STAPLE) ×1 IMPLANT
RELOAD STAPLER 3.5X45 BLU DVNC (STAPLE) IMPLANT
RELOAD STAPLER 3.5X60 BLU DVNC (STAPLE) ×2 IMPLANT
RELOAD STAPLER 4.3X45 GRN DVNC (STAPLE) IMPLANT
RELOAD STAPLER 4.3X60 GRN DVNC (STAPLE) IMPLANT
RETRACTOR WND ALEXIS 18 MED (MISCELLANEOUS) IMPLANT
RTRCTR WOUND ALEXIS 18CM MED (MISCELLANEOUS) ×1
SCISSORS LAP 5X35 DISP (ENDOMECHANICALS) ×2 IMPLANT
SEAL CANN UNIV 5-8 DVNC XI (MISCELLANEOUS) ×6 IMPLANT
SEAL XI 5MM-8MM UNIVERSAL (MISCELLANEOUS) ×3
SEALER VESSEL DA VINCI XI (MISCELLANEOUS) ×1
SEALER VESSEL EXT DVNC XI (MISCELLANEOUS) ×2 IMPLANT
SOL ELECTROSURG ANTI STICK (MISCELLANEOUS)
SOLUTION ELECTROSURG ANTI STCK (MISCELLANEOUS) ×2 IMPLANT
SPIKE FLUID TRANSFER (MISCELLANEOUS) ×2 IMPLANT
STAPLER 45 DA VINCI SURE FORM (STAPLE)
STAPLER 45 SUREFORM DVNC (STAPLE) IMPLANT
STAPLER 60 DA VINCI SURE FORM (STAPLE) ×1
STAPLER 60 SUREFORM DVNC (STAPLE) IMPLANT
STAPLER CANNULA SEAL DVNC XI (STAPLE) ×2 IMPLANT
STAPLER CANNULA SEAL XI (STAPLE) ×1
STAPLER ECHELON POWER CIR 29 (STAPLE) IMPLANT
STAPLER ECHELON POWER CIR 31 (STAPLE) IMPLANT
STAPLER RELOAD 2.5X60 WHITE (STAPLE) ×1
STAPLER RELOAD 2.5X60 WHT DVNC (STAPLE) ×1
STAPLER RELOAD 3.5X45 BLU DVNC (STAPLE)
STAPLER RELOAD 3.5X45 BLUE (STAPLE)
STAPLER RELOAD 3.5X60 BLU DVNC (STAPLE) ×2
STAPLER RELOAD 3.5X60 BLUE (STAPLE) ×2
STAPLER RELOAD 4.3X45 GREEN (STAPLE)
STAPLER RELOAD 4.3X45 GRN DVNC (STAPLE)
STAPLER RELOAD 4.3X60 GREEN (STAPLE)
STAPLER RELOAD 4.3X60 GRN DVNC (STAPLE)
STOPCOCK 4 WAY LG BORE MALE ST (IV SETS) ×4 IMPLANT
SURGILUBE 2OZ TUBE FLIPTOP (MISCELLANEOUS) IMPLANT
SUT MNCRL AB 4-0 PS2 18 (SUTURE) ×2 IMPLANT
SUT PDS AB 1 CT1 27 (SUTURE) ×4 IMPLANT
SUT PROLENE 0 CT 2 (SUTURE) IMPLANT
SUT PROLENE 2 0 KS (SUTURE) IMPLANT
SUT PROLENE 2 0 SH DA (SUTURE) IMPLANT
SUT SILK 2 0 (SUTURE)
SUT SILK 2 0 SH CR/8 (SUTURE) IMPLANT
SUT SILK 2-0 18XBRD TIE 12 (SUTURE) IMPLANT
SUT SILK 3 0 (SUTURE)
SUT SILK 3 0 SH CR/8 (SUTURE) ×2 IMPLANT
SUT SILK 3-0 18XBRD TIE 12 (SUTURE) IMPLANT
SUT V-LOC BARB 180 2/0GR6 GS22 (SUTURE) ×3
SUT VIC AB 3-0 SH 18 (SUTURE) IMPLANT
SUT VIC AB 3-0 SH 27 (SUTURE)
SUT VIC AB 3-0 SH 27XBRD (SUTURE) IMPLANT
SUT VICRYL 0 UR6 27IN ABS (SUTURE) ×2 IMPLANT
SUTURE V-LC BRB 180 2/0GR6GS22 (SUTURE) IMPLANT
SYR 20ML ECCENTRIC (SYRINGE) ×2 IMPLANT
SYS LAPSCP GELPORT 120MM (MISCELLANEOUS)
SYS WOUND ALEXIS 18CM MED (MISCELLANEOUS)
SYSTEM LAPSCP GELPORT 120MM (MISCELLANEOUS) IMPLANT
SYSTEM WOUND ALEXIS 18CM MED (MISCELLANEOUS) ×2 IMPLANT
TOWEL OR NON WOVEN STRL DISP B (DISPOSABLE) ×2 IMPLANT
TRAY FOLEY MTR SLVR 16FR STAT (SET/KITS/TRAYS/PACK) ×2 IMPLANT
TROCAR ADV FIXATION 5X100MM (TROCAR) ×2 IMPLANT
TUBING CONNECTING 10 (TUBING) ×4 IMPLANT
TUBING INSUFFLATION 10FT LAP (TUBING) ×2 IMPLANT

## 2022-10-22 NOTE — Anesthesia Procedure Notes (Signed)
Procedure Name: Intubation Date/Time: 10/22/2022 7:35 AM  Performed by: Montel Clock, CRNAPre-anesthesia Checklist: Patient identified, Emergency Drugs available, Suction available, Patient being monitored and Timeout performed Patient Re-evaluated:Patient Re-evaluated prior to induction Oxygen Delivery Method: Circle system utilized Preoxygenation: Pre-oxygenation with 100% oxygen Induction Type: IV induction Ventilation: Mask ventilation without difficulty and Oral airway inserted - appropriate to patient size Laryngoscope Size: Mac and 3 Grade View: Grade I Tube type: Oral Tube size: 7.0 mm Number of attempts: 1 Airway Equipment and Method: Stylet Placement Confirmation: ETT inserted through vocal cords under direct vision, positive ETCO2 and breath sounds checked- equal and bilateral Secured at: 21 cm Tube secured with: Tape Dental Injury: Teeth and Oropharynx as per pre-operative assessment

## 2022-10-22 NOTE — Transfer of Care (Signed)
Immediate Anesthesia Transfer of Care Note  Patient: Emily Cameron  Procedure(s) Performed: ROBOTIC EXTENDED RIGHT COLECTOMY, PROXIMAL  Patient Location: PACU  Anesthesia Type:General  Level of Consciousness: drowsy and patient cooperative  Airway & Oxygen Therapy: Patient Spontanous Breathing and Patient connected to face mask oxygen  Post-op Assessment: Report given to RN and Post -op Vital signs reviewed and stable  Post vital signs: Reviewed and stable  Last Vitals:  Vitals Value Taken Time  BP 146/85 10/22/22 0945  Temp    Pulse 85 10/22/22 0945  Resp 20 10/22/22 0945  SpO2 100 % 10/22/22 0945  Vitals shown include unvalidated device data.  Last Pain:  Vitals:   10/22/22 0559  PainSc: 0-No pain         Complications: No notable events documented.

## 2022-10-22 NOTE — Consult Note (Signed)
[place] Nurse ostomy consult note Surgical team following for assessment and plan of care. Pt did not receive an ostomy during surgery; no further role for WOC team. Please re-consult if further assistance is needed.  Thank-you,  Julien Girt MSN, [place], [place], [place], [place]

## 2022-10-22 NOTE — Interval H&P Note (Signed)
History and Physical Interval Note:  10/22/2022 7:04 AM  Emily Cameron  has presented today for surgery, with the diagnosis of COLON CANCER.  The various methods of treatment have been discussed with the patient and family. After consideration of risks, benefits and other options for treatment, the patient has consented to  Procedure(s): ROBOTIC COLECTOMY, PROXIMAL (N/A) as a surgical intervention.  The patient's history has been reviewed, patient examined, no change in status, stable for surgery.  I have reviewed the patient's chart and labs.  Questions were answered to the patient's satisfaction.    I have re-reviewed the the patient's records, history, medications, and allergies.  I have re-examined the patient.  I again discussed intraoperative plans and goals of post-operative recovery.  The patient agrees to proceed.  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   History of DVT (deep vein thrombosis) 10/05/2022   Unintentional weight loss 10/05/2022   Chronic pain syndrome 05/08/2015   Breast cancer screening 07/04/2014   DVT (deep venous thrombosis) ([place]) 12/24/2013   Blurry vision, bilateral 09/24/2013   Hoarseness of voice 09/24/2013   MDD (major depressive disorder) 05/01/2013   Hypoglycemia 05/08/2012   Hypotension 05/03/2012   COPD (chronic obstructive pulmonary disease) ([place]) 03/31/2012   Sleep apnea 09/06/2011   Chronic constipation 09/06/2011   Anxiety 09/06/2011   Vitamin D deficiency 09/06/2011   Chest pain 05/03/2011   Reflux 05/03/2011   Tobacco abuse 03/19/2011   Monitoring for long-term anticoagulant use 03/09/2011   Other pulmonary embolism without acute cor pulmonale  ([place]) 03/05/2011   Pulmonary nodule 03/05/2011   HYPERLIPIDEMIA 12/12/2008   SHOULDER PAIN, RIGHT 05/27/2008   ALLERGIC RHINITIS DUE TO POLLEN 04/15/2008   Other depressive episodes 06/29/2007   Adjustment insomnia 06/29/2007   Loss of appetite 06/29/2007    Past Medical History:  Diagnosis Date   Alcohol abuse    Anemia    Anxiety    Arthritis    Cancer of hepatic flexure (H[facility])    COPD (chronic obstructive pulmonary disease) ([facility])    Depression    GERD (gastroesophageal reflux disease)    History of kidney stones    Hypertension    Insomnia    PE (pulmonary embolism) 08/23/2004   Polysubstance abuse ([place])    Current smoking and alcohol. 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 Marijuna not taking since 7 month.    Sleep apnea    Weight loss    due to anxiety    Past Surgical History:  Procedure Laterality Date   ABDOMINAL HYSTERECTOMY     BIOPSY  09/27/2022   Procedure: BIOPSY;  Surgeon: Arta Silence, MD;  Location: [facility];  Service: Gastroenterology;;   COLONOSCOPY WITH PROPOFOL Bilateral 09/27/2022   Procedure: COLONOSCOPY WITH PROPOFOL;  Surgeon: Arta Silence, MD;  Location: [facility];  Service: Gastroenterology;  Laterality: Bilateral;   CYST EXCISION     HEMOSTASIS CLIP PLACEMENT  09/27/2022   Procedure: HEMOSTASIS CLIP PLACEMENT;  Surgeon: Arta Silence, MD;  Location: [facility];  Service: Gastroenterology;;   LITHOTRIPSY     MICROLARYNGOSCOPY Left 06/20/2014   Procedure: MICROLARYNGOSCOPY WITH REMOVAL OF LEFT VOCAL CORD MASS;  Surgeon: Melissa Montane, MD;  Location: [facility];  Service: ENT;  Laterality: Left;   POLYPECTOMY  09/27/2022   Procedure: POLYPECTOMY;  Surgeon: Arta Silence, MD;  Location: [facility];  Service: Gastroenterology;;   SUBMUCOSAL TATTOO INJECTION  09/27/2022   Procedure: SUBMUCOSAL TATTOO INJECTION;  Surgeon: Arta Silence, MD;  Location: [facility];  Service: Gastroenterology;;    Social History   Socioeconomic  History   Marital status: Single    Spouse name: Not on file   Number of children: Not on file   Years of education: Not on file   Highest education level: Not on file  Occupational History   Occupation: Oncologist   Occupation: housekeeping  Tobacco Use   Smoking status: Every Day    Packs/day: 0.50    Years: 31.00    Total pack years: 15.50    Types: Cigarettes   Smokeless tobacco: Never   Tobacco comments:    TO LET DOCTOR KNOW WHEN READY TO [place].  Vaping Use   Vaping Use: Never used  Substance and Sexual Activity   Alcohol use: Yes    Comment: 2 wine coolers per weekend    Drug use: Yes    Frequency: 7.0 times per week    Types: Marijuana   Sexual activity: Yes    Birth control/protection: None  Other Topics Concern   Not on file  Social History Narrative   Single, does not exercise regularly   Social Determinants of Health   Financial Resource Strain: Not on file  Food Insecurity: Not on file  Transportation Needs: Not on file  Physical Activity: Not on file  Stress: Not on file  Social Connections: Not on file  Intimate Partner Violence: Not on file    Family History  Problem Relation Age of Onset   Aneurysm Brother 43   Aneurysm Paternal Aunt 84       Died    Coronary artery disease Sister    Heart disease Mother    Hyperlipidemia Mother    Hypertension Mother    Heart disease Maternal Grandmother    Pulmonary embolism Brother     Medications Prior to Admission  Medication Sig Dispense Refill Last Dose   amitriptyline (ELAVIL) 25 MG tablet Take 1 tablet (25 mg total) by mouth at bedtime. (Patient taking differently: Take 50 mg by mouth at bedtime.) 30 tablet 5 Past Week   docusate sodium (COLACE) 100 MG capsule Take 1 capsule (100 mg total) by mouth every 12 (twelve) hours. 60 capsule 0 10/21/2022   gabapentin (NEURONTIN) 800 MG tablet Take 800 mg by mouth 3 (three) times daily as needed (pain).   10/20/2022   Ibuprofen-diphenhydrAMINE HCl 200-25  MG CAPS Take 1 tablet by mouth at bedtime as needed (sleep).   Past Week   mirtazapine (REMERON) 30 MG tablet Take 30 mg by mouth at bedtime.   10/21/2022   omeprazole (PRILOSEC) 40 MG capsule Take 40 mg by mouth 2 (two) times daily as needed (acid reflux).   Past Week   oxyCODONE-acetaminophen (PERCOCET) 10-325 MG tablet Take 1 tablet by mouth every 6 (six) hours as needed for pain.      QUEtiapine (SEROQUEL) 200 MG tablet Take 200 mg by mouth at bedtime.   Past Month   tiZANidine (ZANAFLEX) 4 MG capsule Take 4 mg by mouth 3 (three) times daily as needed for muscle spasms.   10/21/2022   megestrol (MEGACE) 40 MG/ML suspension TAKE 10 MLS (2 TEASPOONSFUL) BY MOUTH 2 TIMES A WEEK. (Patient not taking: Reported on 10/11/2022) 80 mL 0 Not  Taking   Tetrahydrozoline HCl (VISINE OP) Place 2 drops into both eyes as needed (for dry eyes). Reported on 09/09/2015   More than a month    Current Facility-Administered Medications  Medication Dose Route Frequency Provider Last Rate Last Admin   bupivacaine liposome (EXPAREL) 1.3 % injection 266 mg  20 mL Infiltration Once Michael Boston, MD       cefoTEtan (CEFOTAN) 2 g in sodium chloride 0.9 % 100 mL IVPB  2 g Intravenous On Call to OR Michael Boston, MD       lactated ringers infusion   Intravenous Continuous Audry Pili, MD 10 mL/hr at 10/22/22 [phone] Continued from Pre-op at 10/22/22 0646   phenylephrine (NEOSYNEPHRINE) 20-0.9 MG/250ML-% infusion              No Known Allergies  BP 123/74   Pulse 68   Temp 98.3 F (36.8 C)   Resp 16   Ht '5\' 2"'$  (1.575 m)   Wt 45.1 kg   LMP 01/26/2004   SpO2 98%   BMI 18.18 kg/m   Labs: Results for orders placed or performed during the hospital encounter of 10/22/22 (from the past 48 hour(s))  ABO/Rh     Status: None   Collection Time: 10/22/22  5:55 AM  Result Value Ref Range   ABO/RH(D)      A POS Performed at [facility], [place] [address] [place], [place] [address]     Imaging /  Studies: CT CHEST W CONTRAST  Result Date: 10/06/2022 CLINICAL DATA:  Colon cancer.  Staging.  * Tracking Code: BO * EXAM: CT CHEST WITH CONTRAST TECHNIQUE: Multidetector CT imaging of the chest was performed during intravenous contrast administration. RADIATION DOSE REDUCTION: This exam was performed according to the departmental dose-optimization program which includes automated exposure control, adjustment of the mA and/or kV according to patient size and/or use of iterative reconstruction technique. CONTRAST:  46m OMNIPAQUE IOHEXOL 300 MG/ML  SOLN COMPARISON:  CTA chest 07/11/2020 FINDINGS: Cardiovascular: Heart size appears within normal limits. Aortic atherosclerotic calcification mild LAD calcifications noted. No pericardial effusion. Mediastinum/Nodes: Thyroid gland, trachea, and esophagus are unremarkable. No enlarged axillary, mediastinal, or hilar lymph nodes. Lungs/Pleura: Centrilobular and paraseptal emphysema. Signs smoking related respiratory bronchiolitis. Similar appearance of scarring, architectural distortion, and volume loss within the lateral left base and apical segment of the right upper lobe. -Tiny peripheral nodule in the right upper lobe measures 2 mm and is unchanged from the previous exam compatible with a benign process. -Confluent nodular density within the apical segment of the right upper lobe is unchanged measuring 8 mm and is likely postinflammatory, image 25/4. -Peripheral nodule in the right upper lobe is unchanged measuring 2 mm, image 57/4. Upper Abdomen: No acute findings. There are several small low-density foci within the liver which are technically too small to characterize measuring up to 6 mm. Musculoskeletal: No chest wall abnormality. No acute or significant osseous findings. IMPRESSION: 1. Stable CT of the chest. No specific findings identified to suggest metastatic disease to the chest. 2. Unchanged appearance of scarring, architectural distortion, and volume loss  within the lateral left base and apical segment of the right upper lobe. 3. Stable appearance of small right upper lobe lung nodules which are likely postinflammatory in etiology. 4. Coronary artery calcifications. 5. Aortic Atherosclerosis (ICD10-I70.0) and Emphysema (ICD10-J43.9). Electronically Signed   By: TKerby MoorsM.D.   On: 10/06/2022 07:36     .SAdin Hector M.D., F.A.C.S. Gastrointestinal and  Minimally Invasive Surgery [facility] [address], [place], [place] [address] [phone] Main / Paging  10/22/2022 7:05 AM    Adin Hector

## 2022-10-22 NOTE — H&P (Signed)
10/22/2022   REFERRING PHYSICIAN: Arta Silence, MD  Patient Care Team: Leilani Merl Richrd Sox, NP as PCP - General (Internal Medicine) Johney Maine, Adrian Saran, MD as Consulting Provider (General Surgery) Arta Silence, MD (Gastroenterology)  PROVIDER: Hollace Kinnier, MD  [facility] MRN: [id number] DOB: 08-26-1960  SUBJECTIVE  Chief Complaint: Obstructing Hepatic Flexure Cancer   Emily Cameron is a 62 y.o. female who is seen today as an office consultation at the request of DrPaulita Fujita for evaluation of mass of colon. Probable cancer.  History of Present Illness:  Pleasant woman here today with her sister and daughter. Has had about 50 pounds of unintentional weight loss in the past year. Has chronic constipation, moving her bowels maybe once a week. Has had some more crampy abdominal pain. She started noticing rectal bleeding. Not severely anemic but CAT scan done noted mass in transverse limb of hepatic flexure concerning for cancer. Had outpatient follow-up with Dr. Paulita Fujita with [facility]. He did a colonoscopy within a couple of weeks. Very suspicious for cancer. Biopsy shows high-grade dysplasia with probable focal invasion. CT scan of abdomen pelvis shows no obvious metastatic disease. CT chest not done. Dr. Paulita Fujita call made so we fit the patient in the following week.  Patient smokes about half pack a day. She has some occasional alcohol. Occasional marijuana. She has a distant history of cocaine use over a decade ago but none recently. She did have a DVT 9 years ago and questionable PE at that time. She was on warfarin. She notes she has not been on warfarin in many years. Denies any fevers or chills. Appetite has been down. She normally can walk about 20 minutes before she has to stop. She did have a tubal ligation but denies any other abdominal surgery. - records note TAH & CT scan w/o uterus  Medical History:  Past Medical History: Diagnosis Date Anemia Chronic kidney  disease History of cancer Pulmonary embolus (CMS-HCC)  Patient Active Problem List Diagnosis Cancer of hepatic flexure (CMS-HCC) Unintentional weight loss Tobacco abuse History of DVT (deep vein thrombosis) Chronic constipation  Past Surgical History: Procedure Laterality Date MICROLARYNGOSCOPY WITH REMOVAL OF LEFT VOCAL CORD MASS 06/20/2014 Dr. Loura Pardon COLONOSCOPY WITH PROPOFOL (Bilateral) BIOPSY SUBMUCOSAL TATTOO INJECTION POLYPECTOMY HEMOSTASIS CLIP PLACEMENT 09/27/2022 Dr. Margurite Auerbach   No Known Allergies  Current Outpatient Medications on File Prior to Visit Medication Sig Dispense Refill amitriptyline (ELAVIL) 25 MG tablet Take 25-50 mg by mouth at bedtime docusate (COLACE) 100 MG capsule Take 100 mg by mouth every 12 (twelve) hours gabapentin (NEURONTIN) 800 MG tablet Take by mouth megestroL (MEGACE) 400 mg/10 mL (40 mg/mL) suspension TAKE 1 ML BY MOUTH TWICE A WEEK mirtazapine (REMERON) 30 MG tablet Take 30 mg by mouth at bedtime QUEtiapine (SEROQUEL) 200 MG tablet Take 200 mg by mouth at bedtime tiZANidine (ZANAFLEX) 4 MG capsule Take by mouth  No current facility-administered medications on file prior to visit.  Family History Problem Relation Age of Onset Obesity Mother High blood pressure (Hypertension) Mother Obesity Father High blood pressure (Hypertension) Father Obesity Sister High blood pressure (Hypertension) Sister Obesity Brother High blood pressure (Hypertension) Brother   Social History  Tobacco Use Smoking Status Every Day Packs/day: .5 Types: Cigarettes Smokeless Tobacco Never   Social History  Socioeconomic History Marital status: Single Tobacco Use Smoking status: Every Day Packs/day: .5 Types: Cigarettes Smokeless tobacco: Never Substance and Sexual Activity Alcohol use: Yes Alcohol/week: 2.0 standard drinks of alcohol Types: 2 Standard drinks or equivalent per week  Drug use: Yes Comment:  Marijuana  ############################################################  Review of Systems: A complete review of systems (ROS) was obtained from the patient. We have reviewed this information and discussed as appropriate with the patient. See HPI as well for other pertinent ROS.  Constitutional: No fevers, chills, sweats. Weight stable Eyes: No vision changes, No discharge HENT: No sore throats, nasal drainage Lymph: No neck swelling, No bruising easily Pulmonary: No cough, productive sputum CV: No orthopnea, PND . No exertional chest/neck/shoulder/arm pain. Patient can walk 20 minutes without difficulty.  GI: No personal nor family history of inflammatory bowel disease, irritable bowel syndrome, allergy such as Celiac Sprue, dietary/dairy problems, colitis, ulcers nor gastritis. No recent sick contacts/gastroenteritis. No travel outside the country. No changes in diet.  Renal: No UTIs, No hematuria Genital: No drainage, bleeding, masses Musculoskeletal: No severe joint pain. Good ROM major joints Skin: No sores or lesions Heme/Lymph: No easy bleeding. No swollen lymph nodes Neuro: No active seizures. No facial droop Psych: No hallucinations. No agitation  OBJECTIVE  Vitals: 10/05/22 1153 BP: 100/70 Pulse: 82 Temp: 36.4 C (97.6 F) SpO2: 99% Weight: (!) 44.6 kg (98 lb 6.4 oz) Height: 160 cm ('5\' 3"'$ ) PainSc: 2  Body mass index is 17.43 kg/m.  PHYSICAL EXAM:  Constitutional: Not cachectic. Hygeine adequate. Vitals signs as above. Rather thin but not to the point of cachexia yet. Eyes: No glasses. Vision adequate,Pupils reactive, normal extraocular movements. Sclera nonicteric Neuro: CN II-XII intact. No major focal sensory defects. No major motor deficits. Lymph: No head/neck/groin lymphadenopathy Psych: No severe agitation. No severe anxiety. Judgment & insight Adequate, Oriented x4, HENT: Normocephalic, Mucus membranes moist. No thrush. Hearing: adequate Neck: Supple,  No tracheal deviation. No obvious thyromegaly Chest: No pain to chest wall compression. Good respiratory excursion. No audible wheezing CV: Pulses intact. regular. No major extremity edema Ext: No obvious deformity or contracture. Edema: Not present. No cyanosis Skin: No major subcutaneous nodules. Warm and dry Musculoskeletal: Severe joint rigidity not present. No obvious clubbing. No digital petechiae. Mobility: no assist device moving easily without restrictions  Abdomen: Flat Soft. Nondistended. Nontender. Hernia: Not present. Diastasis recti: Not present. No hepatomegaly. No splenomegaly.  Genital/Pelvic: Inguinal hernia: Not present. Inguinal lymph nodes: without lymphadenopathy nor hidradenitis.  Rectal: (Deferred)    ###################################################################  Labs, Imaging and Diagnostic Testing:  Located in 'Care Everywhere' section of Epic EMR chart  PRIOR [facility] CLINIC NOTES:  Not applicable  SURGERY NOTES:  Not applicable  PATHOLOGY:  Located in [place]' section of Epic EMR chart  Assessment and Plan: DIAGNOSES:  Diagnoses and all orders for this visit:  Cancer of hepatic flexure (CMS-HCC)  Unintentional weight loss  Tobacco abuse  History of DVT (deep vein thrombosis)  Chronic constipation  Other orders - polyethylene glycol (MIRALAX) powder; Take 233.75 g by mouth once for 1 dose Take according to your procedure prep instructions. - bisacodyL (DULCOLAX) 5 mg EC tablet; Take 4 tablets (20 mg total) by mouth once daily as needed for Constipation for up to 1 dose - metroNIDAZOLE (FLAGYL) 500 MG tablet; Take 2 tablets (1,000 mg total) by mouth 3 (three) times daily for 3 doses SEE BOWEL PREP INSTRUCTIONS: Take 2 tablets at 2pm, 3pm, and 10pm the day prior to your colon operation. - neomycin 500 mg tablet; Take 2 tablets (1,000 mg total) by mouth 3 (three) times daily for 3 doses SEE BOWEL PREP INSTRUCTIONS: Take 2 tablets  at 2pm, 3pm, and 10pm the day prior to your colon operation.  ASSESSMENT/PLAN  Pleasant woman with unintentional weight loss and tumor of proximal transverse colon near the hepatic flexure. Showing at least high-grade dysplasia with probable invasion. Looks like a classic cancer by endoscopy and CAT scan.  Standard of care is segmental colonic resection. Decent candidate for a robotic minimally invasive approach with intracorporeal anastomosis.   The anatomy & physiology of the digestive tract was discussed. The pathophysiology of the colon was discussed. Natural history risks without surgery was discussed. I feel the risks of no intervention will lead to serious problems that outweigh the operative risks; therefore, I recommended a partial colectomy to remove the pathology. Minimally invasive (Robotic/Laparoscopic) & open techniques were discussed.  Risks such as bleeding, infection, abscess, leak, reoperation, injury to other organs, need for repair of tissues / organs, possible ostomy, hernia, heart attack, stroke, death, and other risks were discussed. I noted a good likelihood this will help address the problem. Goals of post-operative recovery were discussed as well. Need for adequate nutrition, daily bowel regimen and healthy physical activity, to optimize recovery was noted as well. We will work to minimize complications. Educational materials were available as well. Questions were answered. The patient expresses understanding & wishes to proceed with surgery.  Quit smoking to minimize risks of hernia or anastomotic leak/ostomy. STOP SMOKING! We talked to the patient about the dangers of smoking. We stressed that tobacco use dramatically increases the risk of peri-operative complications such as infection, tissue necrosis leaving to problems with incision/wound and organ healing, hernia, chronic pain, heart attack, stroke, DVT, pulmonary embolism, and death. We noted there are programs in our  community to help stop smoking. Information was available.  Distant history of DVT and PE but no evidence in the past 9 years. She has not been on warfarin at in 3 years according to her. Will defer back to primary service. Usually do low-dose enoxaparin and SCDs perioperative.  Distal history of polysubstance use but claims to be abstinent of cocaine and marijuana. Just down to half a pack of tobacco. Hopefully can get her off that as well.  Adin Hector, MD, FACS, MASCRS Esophageal, Gastrointestinal & Colorectal Surgery Robotic and Minimally Invasive Surgery  [facility] [id number], [place], [place] [address] [phone] Fax [phone] Main  CONTACT INFORMATION:  Weekday (9AM-5PM): Call [facility] main office at [phone]  Weeknight (5PM-9AM) or Weekend/Holiday: Check [email or web address] (password " [email or web address]") for General Surgery [facility] coverage  (Please, do not use SecureChat as it is not reliable communication to reach operating surgeons for immediate patient care given surgeries/outpatient duties/clinic/cross-coverage/off post-call which would lead to a delay in care.  Epic staff messaging available for outptient concerns, but may not be answered for 48 hours or more).    10/22/2022

## 2022-10-22 NOTE — Anesthesia Postprocedure Evaluation (Signed)
Anesthesia Post Note  Patient: Emily Cameron  Procedure(s) Performed: ROBOTIC EXTENDED RIGHT COLECTOMY, PROXIMAL     Patient location during evaluation: PACU Anesthesia Type: General Level of consciousness: awake and alert Pain management: pain level controlled Vital Signs Assessment: post-procedure vital signs reviewed and stable Respiratory status: spontaneous breathing, nonlabored ventilation, respiratory function stable and patient connected to nasal cannula oxygen Cardiovascular status: blood pressure returned to baseline and stable Postop Assessment: no apparent nausea or vomiting Anesthetic complications: no  No notable events documented.  Last Vitals:  Vitals:   10/22/22 1251 10/22/22 1332  BP: 115/75 129/78  Pulse: 72 84  Resp: 15 16  Temp: 36.7 C 36.7 C  SpO2: 100% 100%    Last Pain:  Vitals:   10/22/22 1251  TempSrc: Oral  PainSc:                  [place]

## 2022-10-22 NOTE — Discharge Instructions (Signed)
SURGERY: POST OP INSTRUCTIONS (Surgery for small bowel obstruction, colon resection, etc)   ######################################################################  EAT Gradually transition to a high fiber diet with a fiber supplement over the next few days after discharge  WALK Walk an hour a day.  Control your pain to do that.    CONTROL PAIN Control pain so that you can walk, sleep, tolerate sneezing/coughing, go up/down stairs.  HAVE A BOWEL MOVEMENT DAILY Keep your bowels regular to avoid problems.  OK to try a laxative to override constipation.  OK to use an antidairrheal to slow down diarrhea.  Call if not better after 2 tries  CALL IF YOU HAVE PROBLEMS/CONCERNS Call if you are still struggling despite following these instructions. Call if you have concerns not answered by these instructions  ######################################################################   DIET Follow a light diet the first few days at home.  Start with a bland diet such as soups, liquids, starchy foods, low fat foods, etc.  If you feel full, bloated, or constipated, stay on a ful liquid or pureed/blenderized diet for a few days until you feel better and no longer constipated. Be sure to drink plenty of fluids every day to avoid getting dehydrated (feeling dizzy, not urinating, etc.). Gradually add a fiber supplement to your diet over the next week.  Gradually get back to a regular solid diet.  Avoid fast food or heavy meals the first week as you are more likely to get nauseated. It is expected for your digestive tract to need a few months to get back to normal.  It is common for your bowel movements and stools to be irregular.  You will have occasional bloating and cramping that should eventually fade away.  Until you are eating solid food normally, off all pain medications, and back to regular activities; your bowels will not be normal. Focus on eating a low-fat, high fiber diet the rest of your life  (See Getting to Good Bowel Health, below).  CARE of your INCISION or WOUND  It is good for closed incisions and even open wounds to be washed every day.  Shower every day.  Short baths are fine.  Wash the incisions and wounds clean with soap & water.    You may leave closed incisions open to air if it is dry.   You may cover the incision with clean gauze & replace it after your daily shower for comfort.  TEGADERM:  You have clear gauze band-aid dressings over your closed incision(s).  Remove the dressings 3 days after surgery.    If you have an open wound with a wound vac, see wound vac care instructions.    ACTIVITIES as tolerated Start light daily activities --- self-care, walking, climbing stairs-- beginning the day after surgery.  Gradually increase activities as tolerated.  Control your pain to be active.  Stop when you are tired.  Ideally, walk several times a day, eventually an hour a day.   Most people are back to most day-to-day activities in a few weeks.  It takes 4-8 weeks to get back to unrestricted, intense activity. If you can walk 30 minutes without difficulty, it is safe to try more intense activity such as jogging, treadmill, bicycling, low-impact aerobics, swimming, etc. Save the most intensive and strenuous activity for last (Usually 4-8 weeks after surgery) such as sit-ups, heavy lifting, contact sports, etc.  Refrain from any intense heavy lifting or straining until you are off narcotics for pain control.  You will have off days, but   things should improve week-by-week. DO NOT PUSH THROUGH PAIN.  Let pain be your guide: If it hurts to do something, don't do it.  Pain is your body warning you to avoid that activity for another week until the pain goes down. You may drive when you are no longer taking narcotic prescription pain medication, you can comfortably wear a seatbelt, and you can safely make sudden turns/stops to protect yourself without hesitating due to pain. You may  have sexual intercourse when it is comfortable. If it hurts to do something, stop.  MEDICATIONS Take your usually prescribed home medications unless otherwise directed.   Blood thinners:  Usually you can restart any strong blood thinners after the second postoperative day.  It is OK to take aspirin right away.     If you are on strong blood thinners (warfarin/Coumadin, Plavix, Xerelto, Eliquis, Pradaxa, etc), discuss with your surgeon, medicine PCP, and/or cardiologist for instructions on when to restart the blood thinner & if blood monitoring is needed (PT/INR blood check, etc).     PAIN CONTROL Pain after surgery or related to activity is often due to strain/injury to muscle, tendon, nerves and/or incisions.  This pain is usually short-term and will improve in a few months.  To help speed the process of healing and to get back to regular activity more quickly, DO THE FOLLOWING THINGS TOGETHER: Increase activity gradually.  DO NOT PUSH THROUGH PAIN Use Ice and/or Heat Try Gentle Massage and/or Stretching Take over the counter pain medication Take Narcotic prescription pain medication for more severe pain  Good pain control = faster recovery.  It is better to take more medicine to be more active than to stay in bed all day to avoid medications.  Increase activity gradually Avoid heavy lifting at first, then increase to lifting as tolerated over the next 6 weeks. Do not "push through" the pain.  Listen to your body and avoid positions and maneuvers than reproduce the pain.  Wait a few days before trying something more intense Walking an hour a day is encouraged to help your body recover faster and more safely.  Start slowly and stop when getting sore.  If you can walk 30 minutes without stopping or pain, you can try more intense activity (running, jogging, aerobics, cycling, swimming, treadmill, sex, sports, weightlifting, etc.) Remember: If it hurts to do it, then don't do it! Use Ice and/or  Heat You will have swelling and bruising around the incisions.  This will take several weeks to resolve. Ice packs or heating pads (6-8 times a day, 30-60 minutes at a time) will help sooth soreness & bruising. Some people prefer to use ice alone, heat alone, or alternate between ice & heat.  Experiment and see what works best for you.  Consider trying ice for the first few days to help decrease swelling and bruising; then, switch to heat to help relax sore spots and speed recovery. Shower every day.  Short baths are fine.  It feels good!  Keep the incisions and wounds clean with soap & water.   Try Gentle Massage and/or Stretching Massage at the area of pain many times a day Stop if you feel pain - do not overdo it Take over the counter pain medication This helps the muscle and nerve tissues become less irritable and calm down faster Choose ONE of the following over-the-counter anti-inflammatory medications: Acetaminophen 500mg tabs (Tylenol) 1-2 pills with every meal and just before bedtime (avoid if you have liver problems or   if you have acetaminophen in you narcotic prescription) Naproxen 220mg tabs (ex. Aleve, Naprosyn) 1-2 pills twice a day (avoid if you have kidney, stomach, IBD, or bleeding problems) Ibuprofen 200mg tabs (ex. Advil, Motrin) 3-4 pills with every meal and just before bedtime (avoid if you have kidney, stomach, IBD, or bleeding problems) Take with food/snack several times a day as directed for at least 2 weeks to help keep pain / soreness down & more manageable. Take Narcotic prescription pain medication for more severe pain A prescription for strong pain control is often given to you upon discharge (for example: oxycodone/Percocet, hydrocodone/Norco/Vicodin, or tramadol/Ultram) Take your pain medication as prescribed. Be mindful that most narcotic prescriptions contain Tylenol (acetaminophen) as well - avoid taking too much Tylenol. If you are having problems/concerns with  the prescription medicine (does not control pain, nausea, vomiting, rash, itching, etc.), please call us [phone] to see if we need to switch you to a different pain medicine that will work better for you and/or control your side effects better. If you need a refill on your pain medication, you must call the office before 4 pm and on weekdays only.  By federal law, prescriptions for narcotics cannot be called into a pharmacy.  They must be filled out on paper & picked up from our office by the patient or authorized caretaker.  Prescriptions cannot be filled after 4 pm nor on weekends.    WHEN TO CALL US [phone] Severe uncontrolled or worsening pain  Fever over 101 F (38.5 C) Concerns with the incision: Worsening pain, redness, rash/hives, swelling, bleeding, or drainage Reactions / problems with new medications (itching, rash, hives, nausea, etc.) Nausea and/or vomiting Difficulty urinating Difficulty breathing Worsening fatigue, dizziness, lightheadedness, blurred vision Other concerns If you are not getting better after two weeks or are noticing you are getting worse, contact our office [phone] for further advice.  We may need to adjust your medications, re-evaluate you in the office, send you to the emergency room, or see what other things we can do to help. The clinic staff is available to answer your questions during regular business hours (8:30am-5pm).  Please don't hesitate to call and ask to speak to one of our nurses for clinical concerns.    A surgeon from [facility] is always on call at the hospitals 24 hours/day If you have a medical emergency, go to the nearest emergency room or call 911.  FOLLOW UP in our office One the day of your discharge from the hospital (or the next business weekday), please call [facility] to set up or confirm an appointment to see your surgeon in the office for a follow-up appointment.  Usually it is 2-3 weeks  after your surgery.   If you have skin staples at your incision(s), let the office know so we can set up a time in the office for the nurse to remove them (usually around 10 days after surgery). Make sure that you call for appointments the day of discharge (or the next business weekday) from the hospital to ensure a convenient appointment time. IF YOU HAVE DISABILITY OR FAMILY LEAVE FORMS, BRING THEM TO THE OFFICE FOR PROCESSING.  DO NOT GIVE THEM TO YOUR DOCTOR.  [facility] [address], [address], [place], [place]  [address] ? [phone] - Main [phone] - Toll Free,  [phone] - Fax [email or web address]    GETTING TO GOOD BOWEL HEALTH. It is expected for your   digestive tract to need a few months to get back to normal.  It is common for your bowel movements and stools to be irregular.  You will have occasional bloating and cramping that should eventually fade away.  Until you are eating solid food normally, off all pain medications, and back to regular activities; your bowels will not be normal.   Avoiding constipation The goal: ONE SOFT BOWEL MOVEMENT A DAY!    Drink plenty of fluids.  Choose water first. TAKE A FIBER SUPPLEMENT EVERY DAY THE REST OF YOUR LIFE During your first week back home, gradually add back a fiber supplement every day Experiment which form you can tolerate.   There are many forms such as powders, tablets, wafers, gummies, etc Psyllium bran (Metamucil), methylcellulose (Citrucel), Miralax or Glycolax, Benefiber, Flax Seed.  Adjust the dose week-by-week (1/2 dose/day to 6 doses a day) until you are moving your bowels 1-2 times a day.  Cut back the dose or try a different fiber product if it is giving you problems such as diarrhea or bloating. Sometimes a laxative is needed to help jump-start bowels if constipated until the fiber supplement can help regulate your bowels.  If you are tolerating eating & you are farting, it  is okay to try a gentle laxative such as double dose MiraLax, prune juice, or Milk of Magnesia.  Avoid using laxatives too often. Stool softeners can sometimes help counteract the constipating effects of narcotic pain medicines.  It can also cause diarrhea, so avoid using for too long. If you are still constipated despite taking fiber daily, eating solids, and a few doses of laxatives, call our office. Controlling diarrhea Try drinking liquids and eating bland foods for a few days to avoid stressing your intestines further. Avoid dairy products (especially milk & ice cream) for a short time.  The intestines often can lose the ability to digest lactose when stressed. Avoid foods that cause gassiness or bloating.  Typical foods include beans and other legumes, cabbage, broccoli, and dairy foods.  Avoid greasy, spicy, fast foods.  Every person has some sensitivity to other foods, so listen to your body and avoid those foods that trigger problems for you. Probiotics (such as active yogurt, Align, etc) may help repopulate the intestines and colon with normal bacteria and calm down a sensitive digestive tract Adding a fiber supplement gradually can help thicken stools by absorbing excess fluid and retrain the intestines to act more normally.  Slowly increase the dose over a few weeks.  Too much fiber too soon can backfire and cause cramping & bloating. It is okay to try and slow down diarrhea with a few doses of antidiarrheal medicines.   Bismuth subsalicylate (ex. Kayopectate, Pepto Bismol) for a few doses can help control diarrhea.  Avoid if pregnant.   Loperamide (Imodium) can slow down diarrhea.  Start with one tablet (2mg) first.  Avoid if you are having fevers or severe pain.  ILEOSTOMY PATIENTS WILL HAVE CHRONIC DIARRHEA since their colon is not in use.    Drink plenty of liquids.  You will need to drink even more glasses of water/liquid a day to avoid getting dehydrated. Record output from your  ileostomy.  Expect to empty the bag every 3-4 hours at first.  Most people with a permanent ileostomy empty their bag 4-6 times at the least.   Use antidiarrheal medicine (especially Imodium) several times a day to avoid getting dehydrated.  Start with a dose at bedtime & breakfast.    Adjust up or down as needed.  Increase antidiarrheal medications as directed to avoid emptying the bag more than 8 times a day (every 3 hours). Work with your wound ostomy nurse to learn care for your ostomy.  See ostomy care instructions. TROUBLESHOOTING IRREGULAR BOWELS 1) Start with a soft & bland diet. No spicy, greasy, or fried foods.  2) Avoid gluten/wheat or dairy products from diet to see if symptoms improve. 3) Miralax 17gm or flax seed mixed in 8oz. water or juice-daily. May use 2-4 times a day as needed. 4) Gas-X, Phazyme, etc. as needed for gas & bloating.  5) Prilosec (omeprazole) over-the-counter as needed 6)  Consider probiotics (Align, Activa, etc) to help calm the bowels down  Call your doctor if you are getting worse or not getting better.  Sometimes further testing (cultures, endoscopy, X-ray studies, CT scans, bloodwork, etc.) may be needed to help diagnose and treat the cause of the diarrhea. [facility] [address], [address], [place], [place]  [address] [phone] - Main.    [phone]  - Toll Free.   [phone] - Fax [email or web address]  

## 2022-10-22 NOTE — Consult Note (Signed)
WOC consult requested for presurgical stoma site marking.  This was already performed on 2/19. Our team will follow the patient after surgery for teaching sessions if she receives an ostomy. Thank-you,  Julien Girt MSN, [place], [place], [place], [place]

## 2022-10-22 NOTE — Op Note (Signed)
10/22/2022  9:42 AM  PATIENT:  Emily Cameron  62 y.o. 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  This assistant was needed for exposure, dissection, suction, tissue approximation, retraction, perception, etc  ANESTHESIA:  General endotracheal intubation anesthesia (GETA) and Regional TRANSVERSUS ABDOMINIS PLANE (TAP) nerve block -BILATERAL for perioperative & postoperative pain control at the level of the transverse abdominis & preperitoneal spaces along the flank at the anterior axillary line, from subcostal ridge to iliac crest under laparoscopic guidance provided with liposomal bupivacaine (Experel) 85m mixed with 30 mL of bupivicaine 0.25% with epinephrine  Estimated Blood Loss (EBL):   Total I/O In: 800 [I.V.:700; IV Piggyback:100] Out: 200 [Urine:180; Blood:20].   (See anesthesia record)  Delay start of Pharmacological VTE agent (>24hrs) due to concerns of significant anemia, surgical blood loss, or risk of bleeding?:  no  DRAINS: (None)  SPECIMEN:  Colon - proximal right  DISPOSITION OF SPECIMEN:  Pathology  COUNTS:  Sponge, needle, & instrument counts CORRECT  PLAN OF CARE: Admit to inpatient   PATIENT DISPOSITION:  PACU - hemodynamically stable.  INDICATION:    Woman found to have mass in hepatic flexure of colon with  biopsy showing at least adenomatous tissue.  Strongly suspicious for cancer.  I recommended segmental resection:  The anatomy & physiology of the digestive tract was discussed.  The pathophysiology was discussed.  Natural history risks without surgery was discussed.   I worked to give an overview of the disease and the frequent need to have multispecialty involvement.  I feel the risks of no intervention will lead to serious problems that outweigh the operative risks; therefore, I recommended a partial colectomy to remove the pathology.  Laparoscopic & open techniques were discussed.    Risks such as bleeding, infection, abscess, leak, reoperation, possible ostomy, hernia, heart attack, death, and other risks were discussed.  I noted a good likelihood this will help address the problem.   Goals of post-operative recovery were discussed as well.  We will work to minimize complications.  An educational handout on the pathology was given as well.  Questions were answered.    The patient expresses understanding & wishes to proceed with surgery.  OR FINDINGS:   Patient had bulky mass in the proximal transverse colon just distal to the hepatic flexure.  Some mildly swollen lymph nodes that may be just reactionary.  No obvious metastatic disease on visceral parietal peritoneum or liver.  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  Some moderate omental adhesions. It is an isoperistaltic ileocolonic anastomosis (terminal ileum to mid transverse colon) that rests in the infraumbilical midline region.  CASE DATA:  Type of patient?: Elective [facility] Private Case  Status of Case? Elective Scheduled  Infection Present At Time Of Surgery (PATOS)?  NO    DESCRIPTION:   Informed consent was confirmed.  The patient underwent general  anaesthesia without difficulty.  The patient was positioned with arms tucked & secured appropriately.  VTE prevention in  place.  The patient's abdomen was clipped, prepped, & draped in a sterile fashion.  Surgical timeout confirmed our plan.  The patient was positioned in reverse Trendelenburg.  Abdominal entry was gained using Varess technique at the left subcostal ridge on the anterior abdominal wall.  No elevated EtCO2 noted.  Port placed.  Camera inspection revealed no injury.  Extra ports were carefully placed under direct laparoscopic visualization.  Had to free off some omental adhesions of the anterior abdominal wall and pelvis for proper visualization.  We docked the [employer] Vinci robot carefully and placed intstruments under visualization  Patient had dense adhesions in the right upper quadrant.  Primary team liver and the diaphragm and right upper quadrant abdominal chest wall.  Consistent with Rochele Raring syndrome.  Lysis adhesions to have better mobility and allow the greater omentum and colon to be reflected off on the liver dome since it was quite stretched out and redundant.  I mobilized & reflected the greater omentum in the upper abdomen.  Position the small bowel into the left lower quadrant and pelvis.   I was able to elevate the proximal colon to isolate the ileocolonic pedicle.  I scored the ileal mesentery just proximal to that.   I carried that further dissection in a medial to lateral fashion.  I was able to bluntly get into the retro-mesenteric plane on the right side.  Patient had very little visceral and retroperitoneal fat so took care to avoid injuring contiguous structures.  I freed the proximal right sided colonic mesentery off the retroperitoneum including the duodenal sweep, pancreatic head, right ureter, gonadal vessels & Gerota's fascia of the right kidney. I was able to get underneath the hepatic flexure.  Is able to free off the hepatic flexure adhesions off the retroperitoneum in a medial lateral fashion.  Came a little more medially and then down the right paracolic gutter.  I  isolated the proximal ileocecal pedicle.  I skeletonized it & transected the vessels.    Then freed greater omentum off the mid transverse colon towards the hepatic flexure I connected with the retroperitoneal dissection.  Elevated and got between the stomach and transverse colon into the lesser sac.  Came underneath and placed the mid transverse: Under tension and transected at the proximal/mid junction, taking care to preserve a prominent middle colic arterial pedicle.  Was able to find a region in the mid transverse colon that was over 5 cm distal to the tattooed region that was distal to the obvious acute intraluminal proximal transverse colon mass.  I then proceeded to mobilize the terminal ileum & proximal "right" colon in a lateral to medial fashion.  I mobilized the distal ileal mesentery off its retroperitoneal and pelvic attachments.  I mobilized the ascending colon off It is side wall attachments to the paracolic gutter and retroperitoneum.   Chose an area in the distal ileum and transected the mesentery radially.     We confirmed good viability of the ileum and transverse colon plan for anastomosis. When he went ahead and proceeded with transection.  We transected at the distal ileum with a robotic stapler 78m blue load.  We then transected transverse colon with a robotic stapler 627mblue load.  We confirmed hemostasis.  I did a side-to-side stapled anastomosis of ileum to mid-transverse colon using a 609mhite load in an isoperistaltic fashion.  (Distal stump of  ileum to mid transverse colon for the distal end of the anastomosis.  Proximal end of colon stump to more proximal ileum for the proximal end of the anastomosis).  I sewed the common staple channel wound with an absorbable suture (V-lock) in a running [place] fashion from each corner and meeting in the center.  I did meticulous inspection prove an airtight closure.  I protected the anastomosis line with an anterior omentopexy of greater  omentum using V lock suture.    We did reinspection of the abdomen.  Hemostasis was good.   Ureters, retroperitoneum, and bowel uninjured.  The anastomosis looked healthy.   Endoluminal gas was evacuated.  We placed the wound protector through the suprapubic 75m port site after it was enlarged in a Pfannenstiel fashion.  Specimen removed without incident.   Ports & wound protector removed.  Hemostasis was good.  Sterile unused instruments were used from this point.  I closed the skin at the port sites using Monocryl stitch and sterile dressing.  I closed the extraction wound using a 0 Vicryl vertical peritoneal closure and a #1 PDS transverse anterior rectal fascial closure like a small Pfannenstiel closure. I closed the skin with some interrupted Monocryl stitches.  I placed sterile dressings.     Patient is being extubated go to recovery room. I discussed postop care with the patient in detail the office & in the holding area. Instructions are written. I discussed operative findings, updated the patient's status, discussed probable steps to recovery, and gave postoperative recommendations to the patient's daughter, Emily Cameron.  Recommendations were made.  Questions were answered.  She expressed understanding & appreciation.   SAdin Hector M.D., F.A.C.S. Gastrointestinal and Minimally Invasive Surgery [facility] [address] [place] [id number]([phone] / Paging  10/22/2022

## 2022-10-23 LAB — CBC
HCT: 32.3 % — ABNORMAL LOW (ref 36.0–46.0)
Hemoglobin: 11.1 g/dL — ABNORMAL LOW (ref 12.0–15.0)
MCH: 31.7 pg (ref 26.0–34.0)
MCHC: 34.4 g/dL (ref 30.0–36.0)
MCV: 92.3 fL (ref 80.0–100.0)
Platelets: 185 10*3/uL (ref 150–400)
RBC: 3.5 MIL/uL — ABNORMAL LOW (ref 3.87–5.11)
RDW: 13.5 % (ref 11.5–15.5)
WBC: 11.5 10*3/uL — ABNORMAL HIGH (ref 4.0–10.5)
nRBC: 0 % (ref 0.0–0.2)

## 2022-10-23 LAB — BASIC METABOLIC PANEL
Anion gap: 9 (ref 5–15)
BUN: 8 mg/dL (ref 6–20)
CO2: 30 mmol/L (ref 22–32)
Calcium: 8.9 mg/dL (ref 8.9–10.3)
Chloride: 102 mmol/L (ref 98–111)
Creatinine, Ser: 0.85 mg/dL (ref 0.44–1.00)
GFR, Estimated: >60 mL/min (ref >60)
Glucose, Bld: 84 mg/dL (ref 70–99)
Potassium: 3.1 mmol/L — ABNORMAL LOW (ref 3.5–5.1)
Sodium: 141 mmol/L (ref 135–145)

## 2022-10-23 LAB — MAGNESIUM: Magnesium: 1.8 mg/dL (ref 1.7–2.4)

## 2022-10-23 NOTE — Progress Notes (Signed)
Emily Cameron [id number] [birth year]  CARE TEAM:  PCP: Simona Huh, NP  Outpatient Care Team: Patient Care Team: Simona Huh, NP as PCP - General (Nurse Practitioner) Michael Boston, MD as Consulting Physician (General Surgery) Arta Silence, MD as Consulting Physician (Gastroenterology)  Inpatient Treatment Team: Treatment Team: Attending Provider: Michael Boston, MD; Utilization Review: Claudie Leach, RN; Registered Nurse: Kerney Elbe, RN   Problem List:   Principal Problem:   Transverse colonic mass s/p robotic colectomy 10/22/2022 Active Problems:   Tobacco abuse   Chronic constipation   Anxiety   COPD (chronic obstructive pulmonary disease) (HCC)   Chronic pain syndrome   History of DVT (deep vein thrombosis)   1 Day Post-Op  10/22/2022  POST-OPERATIVE DIAGNOSIS:    COLON MASS, 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  Some mildly swollen lymph nodes that may be just reactionary.   No obvious metastatic disease on visceral parietal peritoneum or liver.  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  Some moderate omental adhesions. It is an isoperistaltic ileocolonic anastomosis (terminal ileum to mid transverse colon) that rests in the infraumbilical midline region.    Assessment  Recovering well so far  [facility] Stay = 1 days)  Plan:  ERAS protocol Tolerating/pured.  Try advance to soft and eventually heart diet Chronic pain control Needing to room air.  No strong evidence of any pulmonary decline reassuring. -VTE prophylaxis- SCDs, etc -mobilize as tolerated to help recovery  I updated  the patient's status to the patient  Recommendations were made.  Questions were answered.  She expressed understanding & appreciation.   Disposition:  Disposition:  The patient is from: Home  Anticipate discharge to:  Home  Anticipated Date of Discharge is:  March 3,2024    Barriers to discharge:  Pending Clinical improvement (more likely than not)  Patient currently is NOT MEDICALLY STABLE for discharge from the hospital from a surgery standpoint.      I reviewed last 24 h vitals and pain scores, last 48 h intake and output, last 24 h labs and trends, and last 24 h imaging results. I have reviewed this patient's available data, including medical history, events of note, test results, etc as part of my evaluation.  A significant portion of that time was spent in counseling.  Care during the described time interval was provided by me.  This care required moderate level of medical decision making.  10/23/2022    Subjective: (Chief complaint)  Patient denies much pain.  Walking hallways.  Tolerated some thicker liquids with some flatus.  Objective:  Vital signs:  Vitals:   10/22/22 1732 10/22/22 2145 10/23/22 0139 10/23/22 0533  BP: 114/64 114/76 122/75 135/76  Pulse: 78 77 83 82  Resp: '16 18 16 17  '$ Temp: 98.1 F (36.7 C) 98.3 F (36.8 C) 97.7 F (36.5 C) 98.3 F (36.8 C)  TempSrc:  Oral Oral   SpO2: 100% 99% 99% 98%  Weight:      Height:        Last BM Date : 10/21/22  Intake/Output   Yesterday:  03/01 0701 - 03/02 0700 In: [id number] [P.O.:360; I.V.:700; IV  [id number] Out: 2250 [Urine:2230; Blood:20] This shift:  No intake/output data recorded.  Bowel function:  Flatus: YES  BM:  No  Drain: (No drain)   Physical Exam:  General: Pt awake/alert in no acute distress Eyes: PERRL, normal EOM.  Sclera clear.  No icterus Neuro: CN II-XII intact w/o focal sensory/motor deficits. Lymph: No head/neck/groin lymphadenopathy Psych:  No  delerium/psychosis/paranoia.  Oriented x 4 HENT: Normocephalic, Mucus membranes moist.  No thrush Neck: Supple, No tracheal deviation.  No obvious thyromegaly Chest: No pain to chest wall compression.  Good respiratory excursion.  No audible wheezing CV:  Pulses intact.  Regular rhythm.  No major extremity edema MS: Normal AROM mjr joints.  No obvious deformity  Abdomen: Soft.  Nondistended.  Nontender.  No evidence of peritonitis.  No incarcerated hernias.  Ext:   No deformity.  No mjr edema.  No cyanosis Skin: No petechiae / purpurea.  No major sores.  Warm and dry    Results:   Cultures: No results found for this or any previous visit (from the past 720 hour(s)).  Labs: Results for orders placed or performed during the hospital encounter of 10/22/22 (from the past 48 hour(s))  ABO/Rh     Status: None   Collection Time: 10/22/22  5:55 AM  Result Value Ref Range   ABO/RH(D)      A POS Performed at [facility], [place] [address] [place], [place] [address]   CBC     Status: Abnormal   Collection Time: 10/23/22  4:47 AM  Result Value Ref Range   WBC 11.5 (H) 4.0 - 10.5 K/uL   RBC 3.50 (L) 3.87 - 5.11 MIL/uL   Hemoglobin 11.1 (L) 12.0 - 15.0 g/dL   HCT 32.3 (L) 36.0 - 46.0 %   MCV 92.3 80.0 - 100.0 fL   MCH 31.7 26.0 - 34.0 pg   MCHC 34.4 30.0 - 36.0 g/dL   RDW 13.5 11.5 - 15.5 %   Platelets 185 150 - 400 K/uL   nRBC 0.0 0.0 - 0.2 %    Comment: Performed at [facility], [place] [address], [place], [place] [address]    Imaging / Studies: No results found.  Medications / Allergies: per chart  Antibiotics: Anti-infectives (From admission, onward)    Start     Dose/Rate Route Frequency Ordered Stop   10/22/22 2000  cefoTEtan (CEFOTAN) 2 g in sodium chloride 0.9 % 100 mL IVPB        2 g 200 mL/hr over 30 Minutes Intravenous Every 12 hours 10/22/22 1058 10/22/22 2024   10/22/22 1400  neomycin (MYCIFRADIN) tablet 1,000 mg  Status:   Discontinued       See Hyperspace for full Linked Orders Report.   1,000 mg Oral 3 times per day 10/22/22 0531 10/22/22 0532   10/22/22 1400  metroNIDAZOLE (FLAGYL) tablet 1,000 mg  Status:  Discontinued       See Hyperspace for full Linked Orders Report.   1,000 mg Oral 3 times per day 10/22/22 0531 10/22/22 0532   10/22/22 0600  cefoTEtan (CEFOTAN) 2 g in sodium chloride 0.9 % 100 mL IVPB        2 g 200 mL/hr over 30 Minutes Intravenous On call to O.R. 10/22/22 0531 10/22/22 0751         Note: Portions of this report may have been transcribed using voice recognition software. Every effort was made to ensure accuracy; however, inadvertent computerized transcription errors may be  present.   Any transcriptional errors that result from this process are unintentional.    Adin Hector, MD, FACS, MASCRS Esophageal, Gastrointestinal & Colorectal Surgery Robotic and Minimally Invasive Surgery  [facility]. [address], [place], [place] [address] [phone] Fax [phone] Main  CONTACT INFORMATION:  Weekday (9AM-5PM): Call [facility] main office at [phone]  Weeknight (5PM-9AM) or Weekend/Holiday: Check [email or web address] (password " [email or web address]") for General Surgery [facility] coverage  (Please, do not use SecureChat as it is not reliable communication to reach operating surgeons for immediate patient care given surgeries/outpatient duties/clinic/cross-coverage/off post-call which would lead to a delay in care.  Epic staff messaging available for outptient concerns, but may not be answered for 48 hours or more).     10/23/2022  7:42 AM

## 2022-10-23 NOTE — Progress Notes (Cosign Needed)
  Transition of Care [facility]) Screening Note   Patient Details  Name: Emily Cameron Date of Birth: [birth year]   [facility] of [facility]) CM/SW Contact:    Henrietta Dine, RN Phone Number: 10/23/2022, 4:35 PM    Transition of Care Department [facility]) has reviewed patient and no TOC needs have been identified at this time. We will continue to monitor patient advancement through interdisciplinary progression rounds. If new patient transition needs arise, please place a TOC consult.

## 2022-10-24 MED ORDER — OXYCODONE-ACETAMINOPHEN 10-325 MG PO TABS: 0.5000 | ORAL_TABLET | Freq: Four times a day (QID) | ORAL | 0 refills | Status: AC | PRN

## 2022-10-24 NOTE — Plan of Care (Signed)

## 2022-10-24 NOTE — Discharge Summary (Signed)
Physician Discharge Summary    Patient ID: Emily Cameron MRN: [id number] DOB/AGE: 11-27-1960  62 y.o.  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  Postoperatively, the patient gradually mobilized and advanced to a solid diet.  Pain and other symptoms were treated aggressively.    By the time of discharge, the patient was walking well the hallways, eating food, having flatus.  Pain was well-controlled on an oral medications.  Based on meeting discharge criteria and continuing to recover, I felt it was safe for the patient to be discharged from the hospital to further recover with close followup. Postoperative recommendations were discussed in detail.  They are written as well.  Discharged Condition: good  Discharge Exam: Blood pressure 116/78, pulse 82, temperature 98.1 F (36.7 C), temperature source Oral, resp. rate 16, height '5\' 2"'$  (1.575 m), weight 46.3 kg, last menstrual period 01/26/2004, SpO2 98 %.  General: Pt awake/alert/oriented x4 in No acute distress Eyes: PERRL, normal EOM.  Sclera  clear.  No icterus Neuro: CN II-XII intact w/o focal sensory/motor deficits. Lymph: No head/neck/groin lymphadenopathy Psych:  No delerium/psychosis/paranoia HENT: Normocephalic, Mucus membranes moist.  No thrush Neck: Supple, No tracheal deviation Chest:  No chest wall pain w good excursion CV:  Pulses intact.  Regular rhythm MS: Normal AROM mjr joints.  No obvious deformity Abdomen: Soft.  Nondistended.  Nontender.  Dressings all incisions clean dry and intact.  No evidence of peritonitis.  No incarcerated hernias. Ext:  SCDs BLE.  No mjr edema.  No cyanosis Skin: No petechiae / purpura   Disposition:    Follow-up Information     Michael Boston, MD Follow up in 3 week(s).   Specialties: General Surgery, Colon and Rectal Surgery Contact information: [address] [place] [address] [phone]                 Discharge disposition: 01-Home or Self Care       Discharge Instructions     Call MD for:   Complete by: As directed    FEVER > 101.5 F  (temperatures < 101.5 F are not significant)   Call MD for:  extreme fatigue   Complete by: As directed    Call MD for:  persistant dizziness or light-headedness   Complete by: As directed    Call MD for:  persistant nausea and vomiting   Complete by: As directed    Call MD for:  redness, tenderness, or signs of infection (pain, swelling, redness, odor or green/yellow discharge around incision site)   Complete by: As directed    Call MD for:  severe uncontrolled pain  Complete by: As directed    Diet - low sodium heart healthy   Complete by: As directed    Start with a bland diet such as soups, liquids, starchy foods, low fat foods, etc. the first few days at home. Gradually advance to a solid, low-fat, high fiber diet by the end of the first week at home.   Add a fiber supplement to your diet (Metamucil, etc) If you feel full, bloated, or constipated, stay on a full liquid or pureed/blenderized  diet for a few days until you feel better and are no longer constipated.   Discharge instructions   Complete by: As directed    See Discharge Instructions If you are not getting better after two weeks or are noticing you are getting worse, contact our office [phone] for further advice.  We may need to adjust your medications, re-evaluate you in the office, send you to the emergency room, or see what other things we can do to help. The clinic staff is available to answer your questions during regular business hours (8:30am-5pm).  Please don't hesitate to call and ask to speak to one of our nurses for clinical concerns.    A surgeon from [facility] is always on call at the hospitals 24 hours/day If you have a medical emergency, go to the nearest emergency room or call 911.   Discharge wound care:   Complete by: As directed    It is good for closed incisions and even open wounds to be washed every day.  Shower every day.  Short baths are fine.  Wash the incisions and wounds clean with soap & water.    You may leave closed incisions open to air if it is dry.   You may cover the incision with clean gauze & replace it after your daily shower for comfort.  TEGADERM:  You have clear gauze band-aid dressings over your closed incision(s).  Remove the dressings 3 days after surgery.   Driving Restrictions   Complete by: As directed    You may drive when: - you are no longer taking narcotic prescription pain medication - you can comfortably wear a seatbelt - you can safely make sudden turns/stops without pain.   Increase activity slowly   Complete by: As directed    Start light daily activities --- self-care, walking, climbing stairs- beginning the day after surgery.  Gradually increase activities as tolerated.  Control your pain to be active.  Stop when you are tired.  Ideally, walk several times a day, eventually an hour a day.   Most people are back to most day-to-day activities in  a few weeks.  It takes 4-6 weeks to get back to unrestricted, intense activity. If you can walk 30 minutes without difficulty, it is safe to try more intense activity such as jogging, treadmill, bicycling, low-impact aerobics, swimming, etc. Save the most intensive and strenuous activity for last (Usually 4-8 weeks after surgery) such as sit-ups, heavy lifting, contact sports, etc.  Refrain from any intense heavy lifting or straining until you are off narcotics for pain control.  You will have off days, but things should improve week-by-week. DO NOT PUSH THROUGH PAIN.  Let pain be your guide: If it hurts to do something, don't do it.   Lifting restrictions   Complete by: As directed    If you can walk 30 minutes without difficulty, it is safe to try more intense activity such as jogging, treadmill, bicycling, low-impact aerobics, swimming,  etc. Save the most intensive and strenuous activity for last (Usually 4-8 weeks after surgery) such as sit-ups, heavy lifting, contact sports, etc.   Refrain from any intense heavy lifting or straining until you are off narcotics for pain control.  You will have off days, but things should improve week-by-week. DO NOT PUSH THROUGH PAIN.  Let pain be your guide: If it hurts to do something, don't do it.  Pain is your body warning you to avoid that activity for another week until the pain goes down.   May shower / Bathe   Complete by: As directed    May walk up steps   Complete by: As directed    Remove dressing in 72 hours   Complete by: As directed    Make sure all dressings are removed by the third day after surgery.  Leave incisions open to air.  OK to cover incisions with gauze or bandages as desired   Sexual Activity Restrictions   Complete by: As directed    You may have sexual intercourse when it is comfortable. If it hurts to do something, stop.       Allergies as of 10/24/2022   No Known Allergies      Medication List     STOP taking these  medications    docusate sodium 100 MG capsule Commonly known as: COLACE   megestrol 40 MG/ML suspension Commonly known as: MEGACE       TAKE these medications    amitriptyline 25 MG tablet Commonly known as: ELAVIL Take 1 tablet (25 mg total) by mouth at bedtime. What changed: how much to take   gabapentin 800 MG tablet Commonly known as: NEURONTIN Take 800 mg by mouth 3 (three) times daily as needed (pain).   Ibuprofen-diphenhydrAMINE HCl 200-25 MG Caps Take 1 tablet by mouth at bedtime as needed (sleep).   mirtazapine 30 MG tablet Commonly known as: REMERON Take 30 mg by mouth at bedtime.   omeprazole 40 MG capsule Commonly known as: PRILOSEC Take 40 mg by mouth 2 (two) times daily as needed (acid reflux).   oxyCODONE-acetaminophen 10-325 MG tablet Commonly known as: PERCOCET Take 0.5-1 tablets by mouth every 6 (six) hours as needed for pain. What changed: how much to take   QUEtiapine 200 MG tablet Commonly known as: SEROQUEL Take 200 mg by mouth at bedtime.   tiZANidine 4 MG capsule Commonly known as: ZANAFLEX Take 4 mg by mouth 3 (three) times daily as needed for muscle spasms.   VISINE OP Place 2 drops into both eyes as needed (for dry eyes). Reported on 09/09/2015               Discharge Care Instructions  (From admission, onward)           Start     Ordered   10/22/22 0000  Discharge wound care:       Comments: It is good for closed incisions and even open wounds to be washed every day.  Shower every day.  Short baths are fine.  Wash the incisions and wounds clean with soap & water.    You may leave closed incisions open to air if it is dry.   You may cover the incision with clean gauze & replace it after your daily shower for comfort.  TEGADERM:  You have clear gauze band-aid dressings over your closed incision(s).  Remove the dressings 3 days after surgery.   10/22/22 [id number]  Significant Diagnostic Studies:  Results for  orders placed or performed during the hospital encounter of 10/22/22 (from the past 72 hour(s))  ABO/Rh     Status: None   Collection Time: 10/22/22  5:55 AM  Result Value Ref Range   ABO/RH(D)      A POS Performed at [facility], [place] [address], [place], [place] [id number]   Basic metabolic panel     Status: Abnormal   Collection Time: 10/23/22  4:47 AM  Result Value Ref Range   Sodium 141 135 - 145 mmol/L   Potassium 3.1 (L) 3.5 - 5.1 mmol/L   Chloride 102 98 - 111 mmol/L   CO2 30 22 - 32 mmol/L   Glucose, Bld 84 70 - 99 mg/dL    Comment: Glucose reference range applies only to samples taken after fasting for at least 8 hours.   BUN 8 6 - 20 mg/dL   Creatinine, Ser 0.85 0.44 - 1.00 mg/dL   Calcium 8.9 8.9 - 10.3 mg/dL   GFR, Estimated >60 >60 mL/min    Comment: (NOTE) Calculated using the CKD-EPI Creatinine Equation (2021)    Anion gap 9 5 - 15    Comment: Performed at [facility], [place] [address], [place], [place] [address]  CBC     Status: Abnormal   Collection Time: 10/23/22  4:47 AM  Result Value Ref Range   WBC 11.5 (H) 4.0 - 10.5 K/uL   RBC 3.50 (L) 3.87 - 5.11 MIL/uL   Hemoglobin 11.1 (L) 12.0 - 15.0 g/dL   HCT 32.3 (L) 36.0 - 46.0 %   MCV 92.3 80.0 - 100.0 fL   MCH 31.7 26.0 - 34.0 pg   MCHC 34.4 30.0 - 36.0 g/dL   RDW 13.5 11.5 - 15.5 %   Platelets 185 150 - 400 K/uL   nRBC 0.0 0.0 - 0.2 %    Comment: Performed at [facility], [place] [address], [place], [place] [address]  Magnesium     Status: None   Collection Time: 10/23/22  4:47 AM  Result Value Ref Range   Magnesium 1.8 1.7 - 2.4 mg/dL    Comment: Performed at [facility], [place] [address] [place], [place] [address]    No results found.  Past Medical History:  Diagnosis Date   Alcohol abuse    Anemia    Anxiety    Arthritis    Cancer of hepatic flexure (HCC)    COPD (chronic obstructive pulmonary disease) (HCC)     Depression    DVT (deep venous thrombosis) ([place]) 12/24/2013   GERD (gastroesophageal reflux disease)    History of kidney stones    Hypertension    Insomnia    Other pulmonary embolism without acute cor pulmonale ([place]) 03/05/2011   Indication: Recurrent Pulmonary emboli (2006 and 2012)   Duration Life long       PE (pulmonary embolism) 08/23/2004   Polysubstance abuse ([place])    Current smoking and alcohol. 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 Marijuna not taking since 7 month.    Sleep apnea    Weight loss    due to anxiety    Past Surgical History:  Procedure Laterality Date   ABDOMINAL HYSTERECTOMY     BIOPSY  09/27/2022   Procedure: BIOPSY;  Surgeon: Arta Silence, MD;  Location: [facility];  Service: Gastroenterology;;   COLONOSCOPY WITH PROPOFOL Bilateral 09/27/2022   Procedure: COLONOSCOPY WITH PROPOFOL;  Surgeon: Arta Silence, MD;  Location: [facility];  Service: Gastroenterology;  Laterality: Bilateral;   CYST EXCISION     HEMOSTASIS CLIP PLACEMENT  09/27/2022   Procedure: HEMOSTASIS CLIP PLACEMENT;  Surgeon: Arta Silence, MD;  Location: [facility];  Service: Gastroenterology;;   LITHOTRIPSY     MICROLARYNGOSCOPY Left 06/20/2014   Procedure: MICROLARYNGOSCOPY WITH REMOVAL OF LEFT VOCAL CORD MASS;  Surgeon: Melissa [place]ane, MD;  Location: [facility];  Service: ENT;  Laterality: Left;   POLYPECTOMY  09/27/2022   Procedure: POLYPECTOMY;  Surgeon: Arta Silence, MD;  Location: [facility];  Service: Gastroenterology;;   SUBMUCOSAL TATTOO INJECTION  09/27/2022   Procedure: SUBMUCOSAL TATTOO INJECTION;  Surgeon: Arta Silence, MD;  Location: [facility];  Service: Gastroenterology;;    Social History   Socioeconomic History   Marital status: Single    Spouse name: Not on file   Number of children: Not on file   Years of education: Not on file   Highest education level: Not on file  Occupational History   Occupation: Oncologist   Occupation:  housekeeping  Tobacco Use   Smoking status: Every Day    Packs/day: 0.50    Years: 31.00    Total pack years: 15.50    Types: Cigarettes   Smokeless tobacco: Never   Tobacco comments:    TO LET DOCTOR KNOW WHEN READY TO [place].  Vaping Use   Vaping Use: Never used  Substance and Sexual Activity   Alcohol use: Yes    Comment: 2 wine coolers per weekend    Drug use: Yes    Frequency: 7.0 times per week    Types: Marijuana   Sexual activity: Yes    Birth control/protection: None  Other Topics Concern   Not on file  Social History Narrative   Single, does not exercise regularly   Social Determinants of Health   Financial Resource Strain: Not on file  Food Insecurity: Not on file  Transportation Needs: Not on file  Physical Activity: Not on file  Stress: Not on file  Social Connections: Not on file  Intimate Partner Violence: Not on file    Family History  Problem Relation Age of Onset   Aneurysm Brother 43   Aneurysm Paternal Aunt 2       Died    Coronary artery disease Sister    Heart disease Mother    Hyperlipidemia Mother    Hypertension Mother    Heart disease Maternal Grandmother    Pulmonary embolism Brother     Current Facility-Administered Medications  Medication Dose Route Frequency Provider Last Rate Last Admin   acetaminophen (TYLENOL) tablet 1,000 mg  1,000 mg Oral Q6H Michael Boston, MD   1,000 mg at 10/24/22 0445   alum & mag hydroxide-simeth (MAALOX/MYLANTA) 200-200-20 MG/5ML suspension 30 mL  30 mL Oral Q6H PRN Michael Boston, MD       alvimopan (ENTEREG) capsule 12 mg  12 mg Oral BID Michael Boston, MD   12 mg at 10/23/22 0844   amitriptyline (ELAVIL) tablet 25 mg  25 mg Oral Ardeen Fillers, MD   25 mg at 10/23/22 2317   diphenhydrAMINE (BENADRYL) 12.5 MG/5ML elixir 12.5 mg  12.5 mg Oral Q6H PRN Michael Boston, MD       Or   diphenhydrAMINE (BENADRYL) injection 12.5 mg  12.5 mg Intravenous Q6H PRN Michael Boston, MD       enoxaparin (LOVENOX)  injection 40 mg  40 mg Subcutaneous Q24H Michael Boston, MD  40 mg at 10/24/22 0738   feeding supplement (ENSURE SURGERY) liquid 237 mL  237 mL Oral BID BM Michael Boston, MD   237 mL at 10/23/22 0844   gabapentin (NEURONTIN) capsule 800 mg  800 mg Oral TID PRN Michael Boston, MD       hydrALAZINE (APRESOLINE) injection 10 mg  10 mg Intravenous Q2H PRN Michael Boston, MD       HYDROmorphone (DILAUDID) injection 0.5-2 mg  0.5-2 mg Intravenous Q4H PRN Michael Boston, MD   1 mg at 10/24/22 0007   lactated ringers bolus 1,000 mL  1,000 mL Intravenous Q8H PRN Michael Boston, MD       lip balm (CARMEX) ointment   Topical BID Michael Boston, MD   Given at 10/23/22 2034   magic mouthwash  15 mL Oral QID PRN Michael Boston, MD       melatonin tablet 3 mg  3 mg Oral QHS PRN Michael Boston, MD       menthol-cetylpyridinium (CEPACOL) lozenge 3 mg  1 lozenge Oral PRN Michael Boston, MD       metoprolol tartrate (LOPRESSOR) injection 5 mg  5 mg Intravenous Q6H PRN Michael Boston, MD       mirtazapine (REMERON) tablet 30 mg  30 mg Oral Ardeen Fillers, MD   30 mg at 10/23/22 2030   ondansetron (ZOFRAN) tablet 4 mg  4 mg Oral Q6H PRN Michael Boston, MD       Or   ondansetron [facility]) injection 4 mg  4 mg Intravenous Q6H PRN Michael Boston, MD       oxyCODONE (Oxy IR/ROXICODONE) immediate release tablet 5-10 mg  5-10 mg Oral Q4H PRN Michael Boston, MD   10 mg at 10/24/22 0445   pantoprazole (PROTONIX) EC tablet 80 mg  80 mg Oral Daily Michael Boston, MD   80 mg at 10/23/22 0844   phenol (CHLORASEPTIC) mouth spray 2 spray  2 spray Mouth/Throat PRN Michael Boston, MD       polycarbophil (FIBERCON) tablet 625 mg  625 mg Oral BID Michael Boston, MD   625 mg at 10/23/22 2030   prochlorperazine (COMPAZINE) tablet 10 mg  10 mg Oral Q6H PRN Michael Boston, MD       Or   prochlorperazine (COMPAZINE) injection 5-10 mg  5-10 mg Intravenous Q6H PRN Michael Boston, MD       QUEtiapine (SEROQUEL) tablet 200 mg  200 mg Oral Ardeen Fillers, MD   200 mg at 10/23/22 2032   simethicone (MYLICON) 40 [id number] suspension 80 mg  80 mg Oral QID PRN Michael Boston, MD       tiZANidine (ZANAFLEX) tablet 4 mg  4 mg Oral TID PRN Michael Boston, MD         No Known Allergies  Signed:   Adin Hector, MD, FACS, MASCRS Esophageal, Gastrointestinal & Colorectal Surgery Robotic and Minimally Invasive Surgery  [facility] [id number], [place], [place] [address] [phone] Fax [phone] Main  CONTACT INFORMATION:  Weekday (9AM-5PM): Call [facility] main office at [phone]  Weeknight (5PM-9AM) or Weekend/Holiday: Check [email or web address] (password " [email or web address]") for General Surgery [facility] coverage  (Please, do not use SecureChat as it is not reliable communication to reach operating surgeons for immediate patient care given surgeries/outpatient duties/clinic/cross-coverage/off post-call which would lead to a delay in care.  Epic staff messaging available for outptient concerns, but may not be answered for 48 hours or more).  10/24/2022, 8:02 AM

## 2022-10-24 NOTE — TOC Initial Note (Signed)
Transition of Care [facility]) - Initial/Assessment Note    Patient Details  Name: Emily Cameron MRN: [id number] Date of Birth: 06-Jan-1961  Transition of [facility]) CM/SW Contact:    Henrietta Dine, RN Phone Number: 10/24/2022, 9:28 AM  Clinical Narrative:                 SDOH assessment; pt d/c'd from room before [facility] assessment completed; called  and spoke w/ her and dtr Wilhemena Durie; her dtr is POC [phone]); pt says she is from home and went to  her dtr's home at d/c; pt has transportation; her dtr says pt does not experience IPV or have food insecurity; pt's dtr says she difficulty paying utilities; she says pt wears upper dentures; she says pt does not have glasses, HA, DME or HH services; she agrees to receive resource for utility assistance; resources placed in d/c instructions; pt's dtr says she has access to [facility] and will review resources; they will make own appt at agency of choice; no TOC needs.  Expected Discharge Plan: Home/Self Care Barriers to Discharge: No Barriers Identified   Patient Goals and CMS Choice Patient states their goals for this hospitalization and ongoing recovery are:: ome          Expected Discharge Plan and Services   Discharge Planning Services: CM Consult   Living arrangements for the past 2 months: Apartment Expected Discharge Date: 10/24/22                                    Prior Living Arrangements/Services Living arrangements for the past 2 months: Apartment Lives with:: Self Patient language and need for interpreter reviewed:: Yes Do you feel safe going back to the place where you live?: Yes      Need for Family Participation in Patient Care: Yes (Comment) Care giver support system in place?: Yes (comment) Current home services:  (n/a) Criminal Activity/Legal Involvement Pertinent to Current Situation/Hospitalization: No - Comment as needed  Activities of Daily Living Home Assistive Devices/Equipment: Dentures  (specify type), Eyeglasses ADL Screening (condition at time of admission) Patient's cognitive ability adequate to safely complete daily activities?: No Is the patient deaf or have difficulty hearing?: No Does the patient have difficulty seeing, even when wearing glasses/contacts?: Yes Does the patient have difficulty concentrating, remembering, or making decisions?: No Patient able to express need for assistance with ADLs?: Yes Does the patient have difficulty dressing or bathing?: No Independently performs ADLs?: Yes (appropriate for developmental age) Does the patient have difficulty walking or climbing stairs?: Yes Weakness of Legs: Both Weakness of Arms/Hands: Both  Permission Sought/Granted Permission sought to share information with : Case Manager Permission granted to share information with : Yes, Verbal Permission Granted  Share Information with NAME: Lenor Coffin, RN, CM     Permission granted to share info w Relationship: Gezelle Arab (dtr) [phone]     Emotional Assessment Appearance::  Pincus Badder) Attitude/Demeanor/Rapport: Gracious Affect (typically observed): Accepting Orientation: : Oriented to Self, Oriented to Place, Oriented to  Time, Oriented to Situation Alcohol / Substance Use: Alcohol Use Psych Involvement: No (comment)  Admission diagnosis:  Colonic mass [K63.89] Patient Active Problem List   Diagnosis Date Noted   Transverse colonic mass s/p robotic colectomy 10/22/2022 10/22/2022   Cancer of hepatic flexure ([place]) 10/05/2022   History of DVT (deep vein thrombosis) 10/05/2022   Unintentional weight loss 10/05/2022   Chronic pain syndrome  05/08/2015   Breast cancer screening 07/04/2014   Blurry vision, bilateral 09/24/2013   Hoarseness of voice 09/24/2013   MDD (major depressive disorder) 05/01/2013   Hypoglycemia 05/08/2012   Hypotension 05/03/2012   COPD (chronic obstructive pulmonary disease) ([place]) 03/31/2012   Sleep apnea 09/06/2011   Chronic  constipation 09/06/2011   Anxiety 09/06/2011   Vitamin D deficiency 09/06/2011   Chest pain 05/03/2011   Reflux 05/03/2011   Tobacco abuse 03/19/2011   Pulmonary nodule 03/05/2011   HYPERLIPIDEMIA 12/12/2008   SHOULDER PAIN, RIGHT 05/27/2008   ALLERGIC RHINITIS DUE TO POLLEN 04/15/2008   Other depressive episodes 06/29/2007   Adjustment insomnia 06/29/2007   Loss of appetite 06/29/2007   PCP:  Simona Huh, NP Pharmacy:   [facility], [place] [address] Phone: [phone] Fax: [place], [place] [address] Phone: [phone] Fax: [phone]  [id number] - [place], [place] - [address] [place] [address] Phone: [phone] Fax: [phone]     Social Determinants of Health (SDOH) Social History: [place]: No Food Insecurity (10/24/2022)  Housing: Low Risk  (10/24/2022)  Transportation Needs: No Transportation Needs (10/24/2022)  Utilities: Not At Risk (10/24/2022)  Tobacco Use: High Risk (10/22/2022)   SDOH Interventions: Food Insecurity Interventions: Inpatient TOC Housing Interventions: Inpatient TOC Transportation Interventions: Inpatient TOC Utilities Interventions: Inpatient TOC   Readmission Risk Interventions     No data to display

## 2022-10-26 LAB — SURGICAL PATHOLOGY

## 2022-10-26 NOTE — Progress Notes (Signed)
GI Tumor Board patient referral:   JANHAVI GUYE  08-Jul-1961 [id number]  CARE TEAM: Patient Care Team: Simona Huh, NP as PCP - General (Nurse Practitioner) Michael Boston, MD as Consulting Physician (General Surgery) Arta Silence, MD as Consulting Physician (Gastroenterology)  Diagnosis: pT3pN0 adenoCA hep flexure  MD Care Team:  Michael Boston, MD - surgery and Gastroenterology:  Will Outlaw  Focus of discussion: Radiology & Pathology reviews - comprehensive   Please send to GI Tumor Coordinator Santiago Glad [place])  in [place] message and attach the medical record to it

## 2022-10-27 ENCOUNTER — Other Ambulatory Visit: Payer: Self-pay

## 2022-10-27 NOTE — Progress Notes (Signed)
The proposed treatment discussed in conference is for discussion purpose only and is not a binding recommendation.  The patients have not been physically examined, or presented with their treatment options.  Therefore, final treatment plans cannot be decided.  

## 2022-10-28 ENCOUNTER — Telehealth: Payer: Self-pay | Admitting: Hematology

## 2022-10-28 NOTE — Telephone Encounter (Signed)
Scheduled appt per 3/6 referral. Pt is aware of appt date and time. Pt is aware to arrive 15 mins prior to appt time and to bring and updated insurance card. Pt is aware of appt location.

## 2022-11-07 NOTE — Progress Notes (Deleted)
[place]   Telephone:(336) [phone] Fax:(336) [place] Note   Patient Care Team: Simona Huh, NP as PCP - General (Nurse Practitioner) Michael Boston, MD as Consulting Physician (General Surgery) Arta Silence, MD as Consulting Physician (Gastroenterology) 11/07/2022  CHIEF COMPLAINTS/PURPOSE OF CONSULTATION:  Right colon cancer  REFERRING PHYSICIAN: Dr. Johney Maine  HISTORY OF PRESENTING ILLNESS:  Emily Cameron 62 y.o. female is here because of her recently diagnosed right-sided colon cancer.  She was referred by her surgeon Dr. Johney Maine.  She presented to the clinic  Patient presents with worsening chronic constipation, she had a bowel movement once a week.  She noticed rectal bleeding, and unintentional 50 pound weight loss.  She was seen by GI Dr. Paulita Fujita, colonoscopy on September 27, 2022, which showed a large ulcerated mass in the hepatic flexure of the transverse colon, biopsy showed tubular adenoma with high-grade dysplasia with possible focal invasion.  Staging CT scan was negative for metastasis.  She was referred to colorectal surgeon Dr. Johney Maine, and underwent right hemicolectomy on October 22, 2022.  Surgical path showed a T3 moderate differentiated adenocarcinoma, surgical margins were negative, or lymph nodes were negative.  She is recovering well from surgery.  MEDICAL HISTORY:  Past Medical History:  Diagnosis Date   Alcohol abuse    Anemia    Anxiety    Arthritis    Cancer of hepatic flexure (HCC)    COPD (chronic obstructive pulmonary disease) ([place])    Depression    DVT (deep venous thrombosis) ([place]) 12/24/2013   GERD (gastroesophageal reflux disease)    History of kidney stones    Hypertension    Insomnia    Other pulmonary embolism without acute cor pulmonale ([place]) 03/05/2011   Indication: Recurrent Pulmonary emboli (2006 and 2012)   Duration Life long       PE (pulmonary embolism) 08/23/2004   Polysubstance abuse ([place])    Current  smoking and alcohol. 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 Marijuna not taking since 7 month.    Sleep apnea    Weight loss    due to anxiety    SURGICAL HISTORY: Past Surgical History:  Procedure Laterality Date   ABDOMINAL HYSTERECTOMY     BIOPSY  09/27/2022   Procedure: BIOPSY;  Surgeon: Arta Silence, MD;  Location: [facility];  Service: Gastroenterology;;   COLONOSCOPY WITH PROPOFOL Bilateral 09/27/2022   Procedure: COLONOSCOPY WITH PROPOFOL;  Surgeon: Arta Silence, MD;  Location: [facility];  Service: Gastroenterology;  Laterality: Bilateral;   CYST EXCISION     HEMOSTASIS CLIP PLACEMENT  09/27/2022   Procedure: HEMOSTASIS CLIP PLACEMENT;  Surgeon: Arta Silence, MD;  [facility];  Service: Gastroenterology;;   LITHOTRIPSY     MICROLARYNGOSCOPY Left 06/20/2014   Procedure: MICROLARYNGOSCOPY WITH REMOVAL OF LEFT VOCAL CORD MASS;  Surgeon: Melissa Montane, MD;  Location: [facility];  Service: ENT;  Laterality: Left;   POLYPECTOMY  09/27/2022   Procedure: POLYPECTOMY;  Surgeon: Arta Silence, MD;  [facility];  Service: Gastroenterology;;   SUBMUCOSAL TATTOO INJECTION  09/27/2022   Procedure: SUBMUCOSAL TATTOO INJECTION;  Surgeon: Arta Silence, MD;  [facility];  Service: Gastroenterology;;    SOCIAL HISTORY: Social History   Socioeconomic History   Marital status: Single    Spouse name: Not on file   Number of children: Not on file   Years of education: Not on file   Highest education level: Not on file  Occupational History  Occupation: Oncologist   Occupation: housekeeping  Tobacco Use   Smoking status: Every Day    Packs/day: 0.50    Years: 31.00    Additional pack years: 0.00    Total pack years: 15.50    Types: Cigarettes   Smokeless tobacco: Never   Tobacco comments:    TO LET DOCTOR KNOW WHEN READY TO QUIT.  Vaping Use   Vaping Use: Never used  Substance and Sexual Activity   Alcohol use: Yes    Comment: 2  wine coolers per weekend    Drug use: Yes    Frequency: 7.0 times per week    Types: Marijuana   Sexual activity: Yes    Birth control/protection: None  Other Topics Concern   Not on file  Social History Narrative   Single, does not exercise regularly   Social Determinants of Health   Financial Resource Strain: Not on file  Food Insecurity: No Food Insecurity (10/24/2022)   Hunger Vital Sign    Worried About Running Out of Food in the Last Year: Never true    Ran Out of Food in the Last Year: Never true  Transportation Needs: No Transportation Needs (10/24/2022)   PRAPARE - Hydrologist (Medical): No    Lack of Transportation (Non-Medical): No  Physical Activity: Not on file  Stress: Not on file  Social Connections: Not on file  Intimate Partner Violence: Not At Risk (10/24/2022)   Humiliation, Afraid, Rape, and Kick questionnaire    Fear of Current or Ex-Partner: No    Emotionally Abused: No    Physically Abused: No    Sexually Abused: No    FAMILY HISTORY: Family History  Problem Relation Age of Onset   Aneurysm Brother 37   Aneurysm Paternal Aunt 35       Died    Coronary artery disease Sister    Heart disease Mother    Hyperlipidemia Mother    Hypertension Mother    Heart disease Maternal Grandmother    Pulmonary embolism Brother     ALLERGIES:  has No Known Allergies.  MEDICATIONS:  Current Outpatient Medications  Medication Sig Dispense Refill   amitriptyline (ELAVIL) 25 MG tablet Take 1 tablet (25 mg total) by mouth at bedtime. (Patient taking differently: Take 50 mg by mouth at bedtime.) 30 tablet 5   gabapentin (NEURONTIN) 800 MG tablet Take 800 mg by mouth 3 (three) times daily as needed (pain).     Ibuprofen-diphenhydrAMINE HCl 200-25 MG CAPS Take 1 tablet by mouth at bedtime as needed (sleep).     mirtazapine (REMERON) 30 MG tablet Take 30 mg by mouth at bedtime.     omeprazole (PRILOSEC) 40 MG capsule Take 40 mg by mouth 2  (two) times daily as needed (acid reflux).     oxyCODONE-acetaminophen (PERCOCET) 10-325 MG tablet Take 0.5-1 tablets by mouth every 6 (six) hours as needed for pain. 20 tablet 0   QUEtiapine (SEROQUEL) 200 MG tablet Take 200 mg by mouth at bedtime.     Tetrahydrozoline HCl (VISINE OP) Place 2 drops into both eyes as needed (for dry eyes). Reported on 09/09/2015     tiZANidine (ZANAFLEX) 4 MG capsule Take 4 mg by mouth 3 (three) times daily as needed for muscle spasms.     No current facility-administered medications for this visit.    REVIEW OF SYSTEMS:   Constitutional: Denies fevers, chills or abnormal night sweats Eyes: Denies blurriness of vision, double vision or  watery eyes Ears, nose, mouth, throat, and face: Denies mucositis or sore throat Respiratory: Denies cough, dyspnea or wheezes Cardiovascular: Denies palpitation, chest discomfort or lower extremity swelling Gastrointestinal:  Denies nausea, heartburn or change in bowel habits Skin: Denies abnormal skin rashes Lymphatics: Denies new lymphadenopathy or easy bruising Neurological:Denies numbness, tingling or new weaknesses Behavioral/Psych: Mood is stable, no new changes  All other systems were reviewed with the patient and are negative.  PHYSICAL EXAMINATION: ECOG PERFORMANCE STATUS: {CHL ONC ECOG [phone]}  There were no vitals filed for this visit. There were no vitals filed for this visit.  GENERAL:alert, no distress and comfortable SKIN: skin color, texture, turgor are normal, no rashes or significant lesions EYES: normal, conjunctiva are pink and non-injected, sclera clear OROPHARYNX:no exudate, no erythema and lips, buccal mucosa, and tongue normal  NECK: supple, thyroid normal size, non-tender, without nodularity LYMPH:  no palpable lymphadenopathy in the cervical, axillary or inguinal LUNGS: clear to auscultation and percussion with normal breathing effort HEART: regular rate & rhythm and no murmurs and no  lower extremity edema ABDOMEN:abdomen soft, non-tender and normal bowel sounds Musculoskeletal:no cyanosis of digits and no clubbing  PSYCH: alert & oriented x 3 with fluent speech NEURO: no focal motor/sensory deficits  LABORATORY DATA:  I have reviewed the data as listed    Latest Ref Rng & Units 10/23/2022    4:47 AM 10/11/2022    9:30 AM 09/27/2022    7:17 AM  CBC  WBC 4.0 - 10.5 K/uL 11.5  7.3    Hemoglobin 12.0 - 15.0 g/dL 11.1  12.7  14.3   Hematocrit 36.0 - 46.0 % 32.3  38.0  42.0   Platelets 150 - 400 K/uL 185  225      @cmpl @  RADIOGRAPHIC STUDIES: I have personally reviewed the radiological images as listed and agreed with the findings in the report. No results found.  ASSESSMENT & PLAN:  *** No orders of the defined types were placed in this encounter.   All questions were answered. The patient knows to call the clinic with any problems, questions or concerns. I spent {CHL ONC TIME VISIT - [id number] counseling the patient face to face. The total time spent in the appointment was {CHL ONC TIME VISIT - [id number] and more than 50% was on counseling.     Truitt Merle, MD 11/07/2022 7:11 PM

## 2022-11-08 ENCOUNTER — Inpatient Hospital Stay: Payer: Medicaid Other | Admitting: Hematology

## 2022-11-08 DIAGNOSIS — C182 Malignant neoplasm of ascending colon: Secondary | ICD-10-CM

## 2022-11-08 NOTE — Progress Notes (Deleted)
[place]   Telephone:(336) [phone] Fax:(336) [place] Note   Patient Care Team: Emily Huh, NP as PCP - General (Nurse Practitioner) Emily Boston, MD as Consulting Physician (General Surgery) Emily Silence, MD as Consulting Physician (Gastroenterology)  Date of Service:  11/08/2022   CHIEF COMPLAINTS/PURPOSE OF CONSULTATION:  Cancer of the right colon  REFERRING PHYSICIAN:  Jobe Cameron  ASSESSMENT & PLAN:  Emily Cameron is a 62 y.o.  female with a history of   1. Cancer of the right Colon [facility])      PLAN:  ***  Oncology History Overview Note   Cancer Staging  Cancer of right colon [facility]) Staging form: Colon and Rectum, AJCC 8th Edition - Pathologic stage from 10/22/2022: Stage IIA (pT3, pN0, cM0) - Signed by Emily Merle, MD on 11/07/2022 Stage prefix: Initial diagnosis Total positive nodes: 0 Histologic grading system: 4 grade system Histologic grade (G): G2 Residual tumor (R): R0 - None     Cancer of right colon ([place])  09/08/2022 Imaging    IMPRESSION: 1. Circumferential mass in the transverse limb of the hepatic flexure consistent with colon cancer. Possible synchronous lesion versus artifact in the rectosigmoid junction. Surgical consultation and/or colonoscopy recommended for further evaluation. 2. No inflammatory changes or obstruction in the bowel. No evidence of acute diverticulitis.     10/05/2022 Initial Diagnosis   Cancer of right colon ([place])   10/06/2022 Imaging    IMPRESSION: 1. Stable CT of the chest. No specific findings identified to suggest metastatic disease to the chest. 2. Unchanged appearance of scarring, architectural distortion, and volume loss within the lateral left base and apical segment of the right upper lobe. 3. Stable appearance of small right upper lobe lung nodules which are likely postinflammatory in etiology. 4. Coronary artery calcifications. 5. Aortic Atherosclerosis  (ICD10-I70.0) and Emphysema (ICD10-J43.9).   10/22/2022 Cancer Staging   Staging form: Colon and Rectum, AJCC 8th Edition - Pathologic stage from 10/22/2022: Stage IIA (pT3, pN0, cM0) - Signed by Emily Merle, MD on 11/07/2022 Stage prefix: Initial diagnosis Total positive nodes: 0 Histologic grading system: 4 grade system Histologic grade (G): G2 Residual tumor (R): R0 - None   10/22/2022 Pathology Results    FINAL MICROSCOPIC DIAGNOSIS:  A. COLON, PROXIMAL RIGHT, RESECTION: Invasive colonic adenocarcinoma, 6 cm Carcinoma extends into pericolonic connective tissue (pT3) All surgical margins negative for carcinoma Thirty-nine lymph nodes negative for metastatic carcinoma (0/39) (pN0) Benign appendix See oncology table  ONCOLOGY TABLE: COLON AND RECTUM, CARCINOMA:  Resection Procedure: Right colectomy Tumor Site: Proximal transverse colon Tumor Size: 6 x 4.5 cm Macroscopic Tumor Perforation: Not identified Histologic Type: Colorectal adenocarcinoma, not otherwise specified Histologic Grade: Moderately differentiated, G2 Multiple Primary Sites: Not applicable Tumor Extension: Into pericolonic connective tissue Lymphovascular Invasion: Not identified Perineural Invasion: Not identified Treatment Effect: No known presurgical therapy Margins:      Margin Status for Invasive Carcinoma: All margins negative for carcinoma Regional Lymph Nodes:      Number of Lymph Nodes with Tumor: 0      Number of Lymph Nodes Examined: 39 Tumor Deposits: Not identified Distant Metastasis:      Distant Site(s) Involved: Not applicable Pathologic Stage Classification (pTNM, AJCC 8th Edition): pT3, pN0 Ancillary Studies: MMR and MSI are pending Representative Tumor Block: A3-A7 (v4.2.0.1)       HISTORY OF PRESENTING ILLNESS: *** Emily Cameron 62 y.o. female is a here because of Cancer of the right colon. The patient  was referred by Gross,Steven,MD. The patient presents to the clinic today  accompanied by ***.   {Story of what lead them here}   Today the patient notes they felt/feeling prior/after...   She has a PMHx of.... Colon Cancer Anemia Depression Anxiety Arthritis COPD GERD Hypertension Substance Abuse    Socially... Single 2 Wine cooler per wk Smoke cigarettes/Marijuana   REVIEW OF SYSTEMS:   *** Constitutional: Denies fevers, chills or abnormal night sweats Eyes: Denies blurriness of vision, double vision or watery eyes Ears, nose, mouth, throat, and face: Denies mucositis or sore throat Respiratory: Denies cough, dyspnea or wheezes Cardiovascular: Denies palpitation, chest discomfort or lower extremity swelling Gastrointestinal:  Denies nausea, heartburn or change in bowel habits Skin: Denies abnormal skin rashes Lymphatics: Denies new lymphadenopathy or easy bruising Neurological:Denies numbness, tingling or new weaknesses Behavioral/Psych: Mood is stable, no new changes  All other systems were reviewed with the patient and are negative.   MEDICAL HISTORY:  Past Medical History:  Diagnosis Date   Alcohol abuse    Anemia    Anxiety    Arthritis    Cancer of hepatic flexure (HCC)    COPD (chronic obstructive pulmonary disease) ([place])    Depression    DVT (deep venous thrombosis) ([place]) 12/24/2013   GERD (gastroesophageal reflux disease)    History of kidney stones    Hypertension    Insomnia    Other pulmonary embolism without acute cor pulmonale ([place]) 03/05/2011   Indication: Recurrent Pulmonary emboli (2006 and 2012)   Duration Life long       PE (pulmonary embolism) 08/23/2004   Polysubstance abuse ([place])    Current smoking and alcohol. 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 Marijuna not taking since 7 month.    Sleep apnea    Weight loss    due to anxiety    SURGICAL HISTORY: Past Surgical History:  Procedure Laterality Date   ABDOMINAL HYSTERECTOMY     BIOPSY  09/27/2022   Procedure: BIOPSY;  Surgeon:  Emily Silence, MD;  [facility];  Service: Gastroenterology;;   COLONOSCOPY WITH PROPOFOL Bilateral 09/27/2022   Procedure: COLONOSCOPY WITH PROPOFOL;  Surgeon: Emily Silence, MD;  [facility];  Service: Gastroenterology;  Laterality: Bilateral;   CYST EXCISION     HEMOSTASIS CLIP PLACEMENT  09/27/2022   Procedure: HEMOSTASIS CLIP PLACEMENT;  Surgeon: Emily Silence, MD;  [facility];  Service: Gastroenterology;;   LITHOTRIPSY     MICROLARYNGOSCOPY Left 06/20/2014   Procedure: MICROLARYNGOSCOPY WITH REMOVAL OF LEFT VOCAL CORD MASS;  Surgeon: Melissa Montane, MD;  Location: [place];  Service: ENT;  Laterality: Left;   POLYPECTOMY  09/27/2022   Procedure: POLYPECTOMY;  Surgeon: Emily Silence, MD;  [facility];  Service: Gastroenterology;;   SUBMUCOSAL TATTOO INJECTION  09/27/2022   Procedure: SUBMUCOSAL TATTOO INJECTION;  Surgeon: Emily Silence, MD;  [facility];  Service: Gastroenterology;;    SOCIAL HISTORY: Social History   Socioeconomic History   Marital status: Single    Spouse name: Not on file   Number of children: Not on file   Years of education: Not on file   Highest education level: Not on file  Occupational History   Occupation: Oncologist   Occupation: housekeeping  Tobacco Use   Smoking status: Every Day    Packs/day: 0.50    Years: 31.00    Additional pack years: 0.00    Total pack years: 15.50    Types: Cigarettes  Smokeless tobacco: Never   Tobacco comments:    TO LET DOCTOR KNOW WHEN READY TO QUIT.  Vaping Use   Vaping Use: Never used  Substance and Sexual Activity   Alcohol use: Yes    Comment: 2 wine coolers per weekend    Drug use: Yes    Frequency: 7.0 times per week    Types: Marijuana   Sexual activity: Yes    Birth control/protection: None  Other Topics Concern   Not on file  Social History Narrative   Single, does not exercise regularly   Social Determinants of Health   Financial  Resource Strain: Not on file  Food Insecurity: No Food Insecurity (10/24/2022)   Hunger Vital Sign    Worried About Running Out of Food in the Last Year: Never true    Ran Out of Food in the Last Year: Never true  Transportation Needs: No Transportation Needs (10/24/2022)   PRAPARE - Hydrologist (Medical): No    Lack of Transportation (Non-Medical): No  Physical Activity: Not on file  Stress: Not on file  Social Connections: Not on file  Intimate Partner Violence: Not At Risk (10/24/2022)   Humiliation, Afraid, Rape, and Kick questionnaire    Fear of Current or Ex-Partner: No    Emotionally Abused: No    Physically Abused: No    Sexually Abused: No    FAMILY HISTORY: Family History  Problem Relation Age of Onset   Aneurysm Brother 96   Aneurysm Paternal Aunt 1       Died    Coronary artery disease Sister    Heart disease Mother    Hyperlipidemia Mother    Hypertension Mother    Heart disease Maternal Grandmother    Pulmonary embolism Brother     ALLERGIES:  has No Known Allergies.  MEDICATIONS:  Current Outpatient Medications  Medication Sig Dispense Refill   amitriptyline (ELAVIL) 25 MG tablet Take 1 tablet (25 mg total) by mouth at bedtime. (Patient taking differently: Take 50 mg by mouth at bedtime.) 30 tablet 5   gabapentin (NEURONTIN) 800 MG tablet Take 800 mg by mouth 3 (three) times daily as needed (pain).     Ibuprofen-diphenhydrAMINE HCl 200-25 MG CAPS Take 1 tablet by mouth at bedtime as needed (sleep).     mirtazapine (REMERON) 30 MG tablet Take 30 mg by mouth at bedtime.     omeprazole (PRILOSEC) 40 MG capsule Take 40 mg by mouth 2 (two) times daily as needed (acid reflux).     oxyCODONE-acetaminophen (PERCOCET) 10-325 MG tablet Take 0.5-1 tablets by mouth every 6 (six) hours as needed for pain. 20 tablet 0   QUEtiapine (SEROQUEL) 200 MG tablet Take 200 mg by mouth at bedtime.     Tetrahydrozoline HCl (VISINE OP) Place 2 drops into  both eyes as needed (for dry eyes). Reported on 09/09/2015     tiZANidine (ZANAFLEX) 4 MG capsule Take 4 mg by mouth 3 (three) times daily as needed for muscle spasms.     No current facility-administered medications for this visit.    PHYSICAL EXAMINATION: ECOG PERFORMANCE STATUS: {CHL ONC ECOG PS:[id number]}  There were no vitals filed for this visit. There were no vitals filed for this visit. *** GENERAL:alert, no distress and comfortable SKIN: skin color, texture, turgor are normal, no rashes or significant lesions EYES: normal, Conjunctiva are pink and non-injected, sclera clear {OROPHARYNX:no exudate, no erythema and lips, buccal mucosa, and tongue normal}  NECK: supple, thyroid  normal size, non-tender, without nodularity LYMPH:  no palpable lymphadenopathy in the cervical, axillary {or inguinal} LUNGS: clear to auscultation and percussion with normal breathing effort HEART: regular rate & rhythm and no murmurs and no lower extremity edema ABDOMEN:abdomen soft, non-tender and normal bowel sounds Musculoskeletal:no cyanosis of digits and no clubbing  NEURO: alert & oriented x 3 with fluent speech, no focal motor/sensory deficits  LABORATORY DATA:  I have reviewed the data as listed    Latest Ref Rng & Units 10/23/2022    4:47 AM 10/11/2022    9:30 AM 09/27/2022    7:17 AM  CBC  WBC 4.0 - 10.5 K/uL 11.5  7.3    Hemoglobin 12.0 - 15.0 g/dL 11.1  12.7  14.3   Hematocrit 36.0 - 46.0 % 32.3  38.0  42.0   Platelets 150 - 400 K/uL 185  225         Latest Ref Rng & Units 10/23/2022    4:47 AM 10/11/2022    9:30 AM 09/27/2022    7:17 AM  CMP  Glucose 70 - 99 mg/dL 84  80  77   BUN 6 - 20 mg/dL 8  12  5    Creatinine 0.44 - 1.00 mg/dL 0.85  0.72  0.60   Sodium 135 - 145 mmol/L 141  141  143   Potassium 3.5 - 5.1 mmol/L 3.1  3.6  3.1   Chloride 98 - 111 mmol/L 102  108  105   CO2 22 - 32 mmol/L 30  28    Calcium 8.9 - 10.3 mg/dL 8.9  8.5       RADIOGRAPHIC STUDIES: I have  personally reviewed the radiological images as listed and agreed with the findings in the report. No results found.   No orders of the defined types were placed in this encounter.   All questions were answered. The patient knows to call the clinic with any problems, questions or concerns. The total time spent in the appointment was {CHL ONC TIME VISIT - [id number].     Baldemar Friday, CMA 11/08/2022 9:21 AM  I, Audry Riles am acting as scribe for Emily Merle, MD.   {Add scribe attestation statement}

## 2022-11-10 DIAGNOSIS — Z79899 Other long term (current) drug therapy: Secondary | ICD-10-CM | POA: Diagnosis not present

## 2022-11-10 NOTE — Progress Notes (Deleted)
[place] [phone]   Fax:(336) [phone]  CONSULT NOTE  REFERRING PHYSICIAN: Dr. Johney Maine  REASON FOR CONSULTATION:  Colorectal Cancer   HPI Emily Cameron is a 62 y.o. female with a past medical history significant for tobacco use, chronic constipation, COPD, chronic pain, history of DVT and ***was referred to the clinic for newly diagnosed colorectal cancer.  The patient presented to the emergency room 09/08/2022 with chief complaint of rectal bleeding.  Several times over the past few weeks.  She also had been having lower abdominal pain for several months worse after eating to stand about 60 pound weight loss over 6 months.  Hemoglobin was stable at 14.2.  The patient had a CT of the abdomen and pelvis showing circumferential mass in the transverse limb of the hepatic flexure consisting of colon cancer and possible synchronous lesion versus artifact in the rectosigmoid junction.   Patient was subsequently referred to Dr. Paulita Fujita from [facility] who performed colonoscopy on 09/27/2022 showing a fungating, infiltrative, sessile and ulcerated partially obstructing large mass at the hepatic flexure which was circumferential.  The patient subsequently had a robotic colectomy by Dr. Johney Maine on 10/22/2022.  The final pathology ([id number]) showed invasive colonic adenocarcinoma measuring 6 cm.  0 out of 39 lymph nodes were negative for carcinoma.  This was staged as a pT3, N0.  MMR normal.  The patient was referred to the clinic to establish care.  Staging CT was negative for metastatic disease.   HPI  Past Medical History:  Diagnosis Date   Alcohol abuse    Anemia    Anxiety    Arthritis    Cancer of hepatic flexure (HCC)    COPD (chronic obstructive pulmonary disease) ([place])    Depression    DVT (deep venous thrombosis) (HCC) 12/24/2013   GERD (gastroesophageal reflux disease)    History of kidney stones    Hypertension    Insomnia    Other pulmonary  embolism without acute cor pulmonale ([place]) 03/05/2011   Indication: Recurrent Pulmonary emboli (2006 and 2012)   Duration Life long       PE (pulmonary embolism) 08/23/2004   Polysubstance abuse ([place])    Current smoking and alcohol. 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 Marijuna not taking since 7 month.    Sleep apnea    Weight loss    due to anxiety    Past Surgical History:  Procedure Laterality Date   ABDOMINAL HYSTERECTOMY     BIOPSY  09/27/2022   Procedure: BIOPSY;  Surgeon: Arta Silence, MD;  [facility];  Service: Gastroenterology;;   COLONOSCOPY WITH PROPOFOL Bilateral 09/27/2022   Procedure: COLONOSCOPY WITH PROPOFOL;  Surgeon: Arta Silence, MD;  [facility];  Service: Gastroenterology;  Laterality: Bilateral;   CYST EXCISION     HEMOSTASIS CLIP PLACEMENT  09/27/2022   Procedure: HEMOSTASIS CLIP PLACEMENT;  Surgeon: Arta Silence, MD;  [facility];  Service: Gastroenterology;;   LITHOTRIPSY     MICROLARYNGOSCOPY Left 06/20/2014   Procedure: MICROLARYNGOSCOPY WITH REMOVAL OF LEFT VOCAL CORD MASS;  Surgeon: Melissa Montane, MD;  Location: [facility];  Service: ENT;  Laterality: Left;   POLYPECTOMY  09/27/2022   Procedure: POLYPECTOMY;  Surgeon: Arta Silence, MD;  Location: [facility];  Service: Gastroenterology;;   SUBMUCOSAL TATTOO INJECTION  09/27/2022   Procedure: SUBMUCOSAL TATTOO INJECTION;  Surgeon: Arta Silence, MD;  Location: [facility];  Service: Gastroenterology;;    Family History  Problem  Relation Age of Onset   Aneurysm Brother 83   Aneurysm Paternal Aunt 67       Died    Coronary artery disease Sister    Heart disease Mother    Hyperlipidemia Mother    Hypertension Mother    Heart disease Maternal Grandmother    Pulmonary embolism Brother     Social History Social History   Tobacco Use   Smoking status: Every Day    Packs/day: 0.50    Years: 31.00    Additional pack years: 0.00    Total pack years:  15.50    Types: Cigarettes   Smokeless tobacco: Never   Tobacco comments:    TO LET DOCTOR KNOW WHEN READY TO QUIT.  Vaping Use   Vaping Use: Never used  Substance Use Topics   Alcohol use: Yes    Comment: 2 wine coolers per weekend    Drug use: Yes    Frequency: 7.0 times per week    Types: Marijuana    No Known Allergies  Current Outpatient Medications  Medication Sig Dispense Refill   amitriptyline (ELAVIL) 25 MG tablet Take 1 tablet (25 mg total) by mouth at bedtime. (Patient taking differently: Take 50 mg by mouth at bedtime.) 30 tablet 5   gabapentin (NEURONTIN) 800 MG tablet Take 800 mg by mouth 3 (three) times daily as needed (pain).     Ibuprofen-diphenhydrAMINE HCl 200-25 MG CAPS Take 1 tablet by mouth at bedtime as needed (sleep).     mirtazapine (REMERON) 30 MG tablet Take 30 mg by mouth at bedtime.     omeprazole (PRILOSEC) 40 MG capsule Take 40 mg by mouth 2 (two) times daily as needed (acid reflux).     oxyCODONE-acetaminophen (PERCOCET) 10-325 MG tablet Take 0.5-1 tablets by mouth every 6 (six) hours as needed for pain. 20 tablet 0   QUEtiapine (SEROQUEL) 200 MG tablet Take 200 mg by mouth at bedtime.     Tetrahydrozoline HCl (VISINE OP) Place 2 drops into both eyes as needed (for dry eyes). Reported on 09/09/2015     tiZANidine (ZANAFLEX) 4 MG capsule Take 4 mg by mouth 3 (three) times daily as needed for muscle spasms.     No current facility-administered medications for this visit.    REVIEW OF SYSTEMS:   Review of Systems  Constitutional: Negative for appetite change, chills, fatigue, fever and unexpected weight change.  HENT:   Negative for mouth sores, nosebleeds, sore throat and trouble swallowing.   Eyes: Negative for eye problems and icterus.  Respiratory: Negative for cough, hemoptysis, shortness of breath and wheezing.   Cardiovascular: Negative for chest pain and leg swelling.  Gastrointestinal: Negative for abdominal pain, constipation, diarrhea,  nausea and vomiting.  Genitourinary: Negative for bladder incontinence, difficulty urinating, dysuria, frequency and hematuria.   Musculoskeletal: Negative for back pain, gait problem, neck pain and neck stiffness.  Skin: Negative for itching and rash.  Neurological: Negative for dizziness, extremity weakness, gait problem, headaches, light-headedness and seizures.  Hematological: Negative for adenopathy. Does not bruise/bleed easily.  Psychiatric/Behavioral: Negative for confusion, depression and sleep disturbance. The patient is not nervous/anxious.     PHYSICAL EXAMINATION:  Last menstrual period 01/26/2004.  ECOG PERFORMANCE STATUS: {CHL ONC ECOG [id number]  Physical Exam  Constitutional: Oriented to person, place, and time and well-developed, well-nourished, and in no distress. No distress.  HENT:  Head: Normocephalic and atraumatic.  Mouth/Throat: Oropharynx is clear and moist. No oropharyngeal exudate.  Eyes: Conjunctivae are normal. Right eye  exhibits no discharge. Left eye exhibits no discharge. No scleral icterus.  Neck: Normal range of motion. Neck supple.  Cardiovascular: Normal rate, regular rhythm, normal heart sounds and intact distal pulses.   Pulmonary/Chest: Effort normal and breath sounds normal. No respiratory distress. No wheezes. No rales.  Abdominal: Soft. Bowel sounds are normal. Exhibits no distension and no mass. There is no tenderness.  Musculoskeletal: Normal range of motion. Exhibits no edema.  Lymphadenopathy:    No cervical adenopathy.  Neurological: Alert and oriented to person, place, and time. Exhibits normal muscle tone. Gait normal. Coordination normal.  Skin: Skin is warm and dry. No rash noted. Not diaphoretic. No erythema. No pallor.  Psychiatric: Mood, memory and judgment normal.  Vitals reviewed.  LABORATORY DATA: Lab Results  Component Value Date   WBC 11.5 (H) 10/23/2022   HGB 11.1 (L) 10/23/2022   HCT 32.3 (L) 10/23/2022   MCV 92.3  10/23/2022   PLT 185 10/23/2022      Chemistry      Component Value Date/Time   NA 141 10/23/2022 0447   NA 142 05/11/2018 1529   K 3.1 (L) 10/23/2022 0447   CL 102 10/23/2022 0447   CO2 30 10/23/2022 0447   BUN 8 10/23/2022 0447   BUN 10 05/11/2018 1529   CREATININE 0.85 10/23/2022 0447   CREATININE 0.68 09/12/2014 1240      Component Value Date/Time   CALCIUM 8.9 10/23/2022 0447   ALKPHOS 89 09/08/2022 1048   AST 23 09/08/2022 1048   ALT 15 09/08/2022 1048   BILITOT 0.4 09/08/2022 1048   BILITOT <0.2 05/11/2018 1529       RADIOGRAPHIC STUDIES: No results found.  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  MMR normal.  Diagnosed February 2024 -Presented with weight loss, abdominal cramping, rectal bleeding in the ER on 09/08/2022 -The patient had a CT of the abdomen and pelvis showing circumferential mass in the transverse limb of the hepatic flexure consisting of colon cancer and possible synchronous lesion versus artifact in the rectosigmoid junction.  -Had colonoscopy on 09/27/22  fungating, infiltrative, sessile and ulcerated partially obstructing large mass at the hepatic flexure which was circumferential. -The patient subsequently had a robotic colectomy by Dr. Johney Maine on 10/22/2022.  The final pathology ([id number]) showed invasive colonic adenocarcinoma measuring 6 cm.  0 out of 39 lymph nodes were negative for carcinoma.  This was staged as a pT3, N0.  MMR normal. -The patient was seen with Dr. Burr Medico today.  Dr. Burr Medico had a lengthy discussion with the patient today about her current condition.  No adjuvant treatment is recommended.  The patient will continue on surveillance with annual colonoscopies, surveillance CT scans, and routine blood work. -Follow-up  Distant history of DVT and PE in *** -Warfarin?  Tobacco abuse  -The patient was still encouraged to quit  smoking    PLAN:  The patient voices understanding of current disease status and treatment options and is in agreement with the current care plan.  All questions were answered. The patient knows to call the clinic with any problems, questions or concerns. We can certainly see the patient much sooner if necessary.  Thank you so much for allowing me to participate in the care of CHARAE OKE. I will continue to follow up the patient with you and assist in her care.  I spent {CHL ONC TIME VISIT - [id number] counseling the patient  face to face. The total time spent in the appointment was {CHL ONC TIME VISIT - [id number].  Disclaimer: This note was dictated with voice recognition software. Similar sounding words can inadvertently be transcribed and may not be corrected upon review.   Raquel Racey L Maui Ahart November 10, 2022, 8:45 PM

## 2022-11-12 ENCOUNTER — Telehealth: Payer: Self-pay | Admitting: Physician Assistant

## 2022-11-12 ENCOUNTER — Inpatient Hospital Stay: Payer: Medicaid Other | Admitting: Physician Assistant

## 2022-11-12 ENCOUNTER — Telehealth: Payer: Self-pay | Admitting: Hematology

## 2022-11-12 DIAGNOSIS — Z79899 Other long term (current) drug therapy: Secondary | ICD-10-CM | POA: Diagnosis not present

## 2022-11-12 NOTE — Telephone Encounter (Signed)
Rescheduled 03/22 appointment to 04/04, patient has been called and notified.

## 2022-11-12 NOTE — Telephone Encounter (Signed)
The patient did not show up to her appointment today. She states she tried to call this AM but was unable to get through the phone lines. She is not able to make it and will need to be rescheduled. I have messaged RN navigator to help coordinate her rescheduled appointment.

## 2022-11-24 NOTE — Progress Notes (Deleted)
[place]   Telephone:([phone] Fax:(336) [place] Note   Patient Care Team: Simona Huh, NP as PCP - General (Nurse Practitioner) Michael Boston, MD as Consulting Physician (General Surgery) Arta Silence, MD as Consulting Physician (Gastroenterology)  Date of Service:  11/24/2022   CHIEF COMPLAINTS/PURPOSE OF CONSULTATION:  Colon Cancer  REFERRING PHYSICIAN:  Hollace Kinnier, MD  ASSESSMENT & PLAN:  Emily Cameron is a 62 y.o. *** female with a history of ***  1. {Diagnosis from problem list} ***      PLAN:  ***  Oncology History Overview Note   Cancer Staging  Cancer of right colon [facility]) Staging form: Colon and Rectum, AJCC 8th Edition - Pathologic stage from 10/22/2022: Stage IIA (pT3, pN0, cM0) - Signed by Truitt Merle, MD on 11/07/2022 Stage prefix: Initial diagnosis Total positive nodes: 0 Histologic grading system: 4 grade system Histologic grade (G): G2 Residual tumor (R): R0 - None     Cancer of right colon  09/08/2022 Imaging    IMPRESSION: 1. Circumferential mass in the transverse limb of the hepatic flexure consistent with colon cancer. Possible synchronous lesion versus artifact in the rectosigmoid junction. Surgical consultation and/or colonoscopy recommended for further evaluation. 2. No inflammatory changes or obstruction in the bowel. No evidence of acute diverticulitis.     10/05/2022 Initial Diagnosis   Cancer of right colon ([place])   10/06/2022 Imaging    IMPRESSION: 1. Stable CT of the chest. No specific findings identified to suggest metastatic disease to the chest. 2. Unchanged appearance of scarring, architectural distortion, and volume loss within the lateral left base and apical segment of the right upper lobe. 3. Stable appearance of small right upper lobe lung nodules which are likely postinflammatory in etiology. 4. Coronary artery calcifications. 5. Aortic Atherosclerosis  (ICD10-I70.0) and Emphysema (ICD10-J43.9).   10/22/2022 Cancer Staging   Staging form: Colon and Rectum, AJCC 8th Edition - Pathologic stage from 10/22/2022: Stage IIA (pT3, pN0, cM0) - Signed by Truitt Merle, MD on 11/07/2022 Stage prefix: Initial diagnosis Total positive nodes: 0 Histologic grading system: 4 grade system Histologic grade (G): G2 Residual tumor (R): R0 - None   10/22/2022 Pathology Results    FINAL MICROSCOPIC DIAGNOSIS:  A. COLON, PROXIMAL RIGHT, RESECTION: Invasive colonic adenocarcinoma, 6 cm Carcinoma extends into pericolonic connective tissue (pT3) All surgical margins negative for carcinoma Thirty-nine lymph nodes negative for metastatic carcinoma (0/39) (pN0) Benign appendix See oncology table  ONCOLOGY TABLE: COLON AND RECTUM, CARCINOMA:  Resection Procedure: Right colectomy Tumor Site: Proximal transverse colon Tumor Size: 6 x 4.5 cm Macroscopic Tumor Perforation: Not identified Histologic Type: Colorectal adenocarcinoma, not otherwise specified Histologic Grade: Moderately differentiated, G2 Multiple Primary Sites: Not applicable Tumor Extension: Into pericolonic connective tissue Lymphovascular Invasion: Not identified Perineural Invasion: Not identified Treatment Effect: No known presurgical therapy Margins:      Margin Status for Invasive Carcinoma: All margins negative for carcinoma Regional Lymph Nodes:      Number of Lymph Nodes with Tumor: 0      Number of Lymph Nodes Examined: 39 Tumor Deposits: Not identified Distant Metastasis:      Distant Site(s) Involved: Not applicable Pathologic Stage Classification (pTNM, AJCC 8th Edition): pT3, pN0 Ancillary Studies: MMR and MSI are pending Representative Tumor Block: A3-A7 (v4.2.0.1)       HISTORY OF PRESENTING ILLNESS: *** Emily Cameron 62 y.o. female is a here because of Colon Cancer. The patient was referred by Gross,Steven Cummins,MD. The patient  presents to the clinic today accompanied by  ***.   {Story of what lead them here}   Today the patient notes they felt/feeling prior/after...   She has a PMHx of.... Anemia Chronic Kidney Disease History of Cancer Pulmonary Embolus (CMS-HCC)   Socially... Single Smoker Drinks Alcohol 2 drinks a wk Recreational drugs-Marijuana  REVIEW OF SYSTEMS:   *** Constitutional: Denies fevers, chills or abnormal night sweats Eyes: Denies blurriness of vision, double vision or watery eyes Ears, nose, mouth, throat, and face: Denies mucositis or sore throat Respiratory: Denies cough, dyspnea or wheezes Cardiovascular: Denies palpitation, chest discomfort or lower extremity swelling Gastrointestinal:  Denies nausea, heartburn or change in bowel habits Skin: Denies abnormal skin rashes Lymphatics: Denies new lymphadenopathy or easy bruising Neurological:Denies numbness, tingling or new weaknesses Behavioral/Psych: Mood is stable, no new changes  All other systems were reviewed with the patient and are negative.   MEDICAL HISTORY:  Past Medical History:  Diagnosis Date   Alcohol abuse    Anemia    Anxiety    Arthritis    Cancer of hepatic flexure (HCC)    COPD (chronic obstructive pulmonary disease) ([place])    Depression    DVT (deep venous thrombosis) ([place]) 12/24/2013   GERD (gastroesophageal reflux disease)    History of kidney stones    Hypertension    Insomnia    Other pulmonary embolism without acute cor pulmonale ([place]) 03/05/2011   Indication: Recurrent Pulmonary emboli (2006 and 2012)   Duration Life long       PE (pulmonary embolism) 08/23/2004   Polysubstance abuse ([place])    Current smoking and alcohol. 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 Marijuna not taking since 7 month.    Sleep apnea    Weight loss    due to anxiety    SURGICAL HISTORY: Past Surgical History:  Procedure Laterality Date   ABDOMINAL HYSTERECTOMY     BIOPSY  09/27/2022   Procedure: BIOPSY;  Surgeon: Arta Silence, MD;   [facility];  Service: Gastroenterology;;   COLONOSCOPY WITH PROPOFOL Bilateral 09/27/2022   Procedure: COLONOSCOPY WITH PROPOFOL;  Surgeon: Arta Silence, MD;  [facility];  Service: Gastroenterology;  Laterality: Bilateral;   CYST EXCISION     HEMOSTASIS CLIP PLACEMENT  09/27/2022   Procedure: HEMOSTASIS CLIP PLACEMENT;  Surgeon: Arta Silence, MD;  [facility];  Service: Gastroenterology;;   LITHOTRIPSY     MICROLARYNGOSCOPY Left 06/20/2014   Procedure: MICROLARYNGOSCOPY WITH REMOVAL OF LEFT VOCAL CORD MASS;  Surgeon: Melissa Montane, MD;  Location: [facility];  Service: ENT;  Laterality: Left;   POLYPECTOMY  09/27/2022   Procedure: POLYPECTOMY;  Surgeon: Arta Silence, MD;  [facility];  Service: Gastroenterology;;   SUBMUCOSAL TATTOO INJECTION  09/27/2022   Procedure: SUBMUCOSAL TATTOO INJECTION;  Surgeon: Arta Silence, MD;  [facility];  Service: Gastroenterology;;    SOCIAL HISTORY: Social History   Socioeconomic History   Marital status: Single    Spouse name: Not on file   Number of children: Not on file   Years of education: Not on file   Highest education level: Not on file  Occupational History   Occupation: Oncologist   Occupation: housekeeping  Tobacco Use   Smoking status: Every Day    Packs/day: 0.50    Years: 31.00    Additional pack years: 0.00    Total pack years: 15.50    Types: Cigarettes   Smokeless tobacco: Never   Tobacco comments:  TO LET DOCTOR KNOW WHEN READY TO QUIT.  Vaping Use   Vaping Use: Never used  Substance and Sexual Activity   Alcohol use: Yes    Comment: 2 wine coolers per weekend    Drug use: Yes    Frequency: 7.0 times per week    Types: Marijuana   Sexual activity: Yes    Birth control/protection: None  Other Topics Concern   Not on file  Social History Narrative   Single, does not exercise regularly   Social Determinants of Health   Financial Resource Strain: Not on file   Food Insecurity: No Food Insecurity (10/24/2022)   Hunger Vital Sign    Worried About Running Out of Food in the Last Year: Never true    Ran Out of Food in the Last Year: Never true  Transportation Needs: No Transportation Needs (10/24/2022)   PRAPARE - Hydrologist (Medical): No    Lack of Transportation (Non-Medical): No  Physical Activity: Not on file  Stress: Not on file  Social Connections: Not on file  Intimate Partner Violence: Not At Risk (10/24/2022)   Humiliation, Afraid, Rape, and Kick questionnaire    Fear of Current or Ex-Partner: No    Emotionally Abused: No    Physically Abused: No    Sexually Abused: No    FAMILY HISTORY: Family History  Problem Relation Age of Onset   Aneurysm Brother 44   Aneurysm Paternal Aunt 45       Died    Coronary artery disease Sister    Heart disease Mother    Hyperlipidemia Mother    Hypertension Mother    Heart disease Maternal Grandmother    Pulmonary embolism Brother     ALLERGIES:  has No Known Allergies.  MEDICATIONS:  Current Outpatient Medications  Medication Sig Dispense Refill   amitriptyline (ELAVIL) 25 MG tablet Take 1 tablet (25 mg total) by mouth at bedtime. (Patient taking differently: Take 50 mg by mouth at bedtime.) 30 tablet 5   gabapentin (NEURONTIN) 800 MG tablet Take 800 mg by mouth 3 (three) times daily as needed (pain).     Ibuprofen-diphenhydrAMINE HCl 200-25 MG CAPS Take 1 tablet by mouth at bedtime as needed (sleep).     mirtazapine (REMERON) 30 MG tablet Take 30 mg by mouth at bedtime.     omeprazole (PRILOSEC) 40 MG capsule Take 40 mg by mouth 2 (two) times daily as needed (acid reflux).     oxyCODONE-acetaminophen (PERCOCET) 10-325 MG tablet Take 0.5-1 tablets by mouth every 6 (six) hours as needed for pain. 20 tablet 0   QUEtiapine (SEROQUEL) 200 MG tablet Take 200 mg by mouth at bedtime.     Tetrahydrozoline HCl (VISINE OP) Place 2 drops into both eyes as needed (for dry  eyes). Reported on 09/09/2015     tiZANidine (ZANAFLEX) 4 MG capsule Take 4 mg by mouth 3 (three) times daily as needed for muscle spasms.     No current facility-administered medications for this visit.    PHYSICAL EXAMINATION: ECOG PERFORMANCE STATUS: {CHL ONC ECOG PS:[id number]}  There were no vitals filed for this visit. There were no vitals filed for this visit. *** GENERAL:alert, no distress and comfortable SKIN: skin color, texture, turgor are normal, no rashes or significant lesions EYES: normal, Conjunctiva are pink and non-injected, sclera clear {OROPHARYNX:no exudate, no erythema and lips, buccal mucosa, and tongue normal}  NECK: supple, thyroid normal size, non-tender, without nodularity LYMPH:  no palpable lymphadenopathy  in the cervical, axillary {or inguinal} LUNGS: clear to auscultation and percussion with normal breathing effort HEART: regular rate & rhythm and no murmurs and no lower extremity edema ABDOMEN:abdomen soft, non-tender and normal bowel sounds Musculoskeletal:no cyanosis of digits and no clubbing  NEURO: alert & oriented x 3 with fluent speech, no focal motor/sensory deficits  LABORATORY DATA:  I have reviewed the data as listed    Latest Ref Rng & Units 10/23/2022    4:47 AM 10/11/2022    9:30 AM 09/27/2022    7:17 AM  CBC  WBC 4.0 - 10.5 K/uL 11.5  7.3    Hemoglobin 12.0 - 15.0 g/dL 11.1  12.7  14.3   Hematocrit 36.0 - 46.0 % 32.3  38.0  42.0   Platelets 150 - 400 K/uL 185  225         Latest Ref Rng & Units 10/23/2022    4:47 AM 10/11/2022    9:30 AM 09/27/2022    7:17 AM  CMP  Glucose 70 - 99 mg/dL 84  80  77   BUN 6 - 20 mg/dL 8  12  5    Creatinine 0.44 - 1.00 mg/dL 0.85  0.72  0.60   Sodium 135 - 145 mmol/L 141  141  143   Potassium 3.5 - 5.1 mmol/L 3.1  3.6  3.1   Chloride 98 - 111 mmol/L 102  108  105   CO2 22 - 32 mmol/L 30  28    Calcium 8.9 - 10.3 mg/dL 8.9  8.5       RADIOGRAPHIC STUDIES: I have personally reviewed the  radiological images as listed and agreed with the findings in the report. No results found.   No orders of the defined types were placed in this encounter.   All questions were answered. The patient knows to call the clinic with any problems, questions or concerns. The total time spent in the appointment was {CHL ONC TIME VISIT - [id number].     Baldemar Friday, CMA 11/24/2022 4:19 PM  I, Audry Riles am acting as scribe for Truitt Merle, MD.   {Add scribe attestation statement}

## 2022-11-25 ENCOUNTER — Inpatient Hospital Stay: Payer: Medicaid Other | Admitting: Hematology

## 2022-11-25 NOTE — Assessment & Plan Note (Deleted)
-  she presented with rectal bleeding to emergency room in January 2024, colonoscopy revealed a large mass in the hepatic fracture of right colon, biopsy showed adenoma.  Given the high clinical suspicion for malignancy, she underwent a right hemicolectomy on 10/22/2022 by Dr. Johney Maine.  -I reviewed her surgical pathology findings, which showed moderate differentiated adenocarcinoma, pT3 N0, with negative margins.  No lymphovascular invasion or perineural invasion.  -Given the lack of high risk features for stage II colon cancer, I do not recommend adjuvant chemotherapy -I discussed the role of circulating DNA testing to predict risk of recurrence --I discussed the risk of cancer recurrence in the future. I discussed the surveillance plan, which is a physical exam and lab test (including CBC, CMP and CEA) every 3 months for the first 2 years, then every 6-12 months, colonoscopy in one year, and surveilliance CT scan every 6-12 month for up to 5 year.

## 2022-11-25 NOTE — Progress Notes (Signed)
I spoke with Emily Cameron.  She forgot about her appt today with Dr Burr Medico.  I have rescheduled her for 4/19 at 1540 per Dr Burr Medico.  I gave her our address.  I requested she arrive 15 mins early to register.  All questions were answered.  She verbalized understanding.Marland Kitchen

## 2022-12-03 DIAGNOSIS — E559 Vitamin D deficiency, unspecified: Secondary | ICD-10-CM | POA: Diagnosis not present

## 2022-12-03 DIAGNOSIS — Z1159 Encounter for screening for other viral diseases: Secondary | ICD-10-CM | POA: Diagnosis not present

## 2022-12-03 DIAGNOSIS — Z0000 Encounter for general adult medical examination without abnormal findings: Secondary | ICD-10-CM | POA: Diagnosis not present

## 2022-12-03 DIAGNOSIS — R5383 Other fatigue: Secondary | ICD-10-CM | POA: Diagnosis not present

## 2022-12-10 ENCOUNTER — Encounter: Payer: Self-pay | Admitting: Hematology

## 2022-12-10 ENCOUNTER — Inpatient Hospital Stay: Payer: Medicaid Other | Attending: Hematology | Admitting: Hematology

## 2022-12-10 ENCOUNTER — Other Ambulatory Visit: Payer: Self-pay

## 2022-12-10 VITALS — BP 133/94 | HR 74 | Temp 98.1°F | Resp 15 | Ht 64.0 in | Wt 102.9 lb

## 2022-12-10 DIAGNOSIS — F1911 Other psychoactive substance abuse, in remission: Secondary | ICD-10-CM | POA: Insufficient documentation

## 2022-12-10 DIAGNOSIS — I10 Essential (primary) hypertension: Secondary | ICD-10-CM | POA: Diagnosis not present

## 2022-12-10 DIAGNOSIS — C184 Malignant neoplasm of transverse colon: Secondary | ICD-10-CM | POA: Insufficient documentation

## 2022-12-10 DIAGNOSIS — J449 Chronic obstructive pulmonary disease, unspecified: Secondary | ICD-10-CM | POA: Insufficient documentation

## 2022-12-10 DIAGNOSIS — F1090 Alcohol use, unspecified, uncomplicated: Secondary | ICD-10-CM | POA: Insufficient documentation

## 2022-12-10 DIAGNOSIS — Z86718 Personal history of other venous thrombosis and embolism: Secondary | ICD-10-CM | POA: Insufficient documentation

## 2022-12-10 DIAGNOSIS — F17210 Nicotine dependence, cigarettes, uncomplicated: Secondary | ICD-10-CM | POA: Diagnosis not present

## 2022-12-10 DIAGNOSIS — C182 Malignant neoplasm of ascending colon: Secondary | ICD-10-CM

## 2022-12-10 DIAGNOSIS — Z86711 Personal history of pulmonary embolism: Secondary | ICD-10-CM | POA: Insufficient documentation

## 2022-12-10 NOTE — Addendum Note (Signed)
Addended by: Malachy Mood on: 12/10/2022 11:41 PM   Modules accepted: Orders

## 2022-12-10 NOTE — Progress Notes (Signed)
[facility]   Telephone:([phone] Fax:(336) [phone]   Clinic New Consult Note   Patient Care Team: Courtney Paris, NP as PCP - General (Nurse Practitioner) Karie Soda, MD as Consulting Physician (General Surgery) Willis Modena, MD as Consulting Physician (Gastroenterology) 12/10/2022  CHIEF COMPLAINTS/PURPOSE OF CONSULTATION:  Cancer of right colon  Referring physician: Dr. Michaell Cowing   HISTORY OF PRESENTING ILLNESS:  Emily Cameron 62 y.o. female is here because of her recently diagnosed right-sided colon cancer.  She was referred by her surgeon Dr. Michaell Cowing.  She presents to clinic alone.   She presented to emergency room on September 08, 2022 with several weeks of recurrent rectal bleeding.  CT abdomen pelvis was obtained during her ED visit which showed a circumferential mass in the transverse limb of the hepatic flexure consistent with colon cancer.  Possible synchronous lesion versus artifact in the rectosigmoid junction was also noticed.  No evidence of metastasis on the CT scan.  She was referred to GI, underwent colonoscopy by Dr. Dulce Sellar on September 27, 2022, which showed a fungating ulcerated partially obstructing large mass at the hepatic flexure.  Biopsy showed adenocarcinoma.  A 15 mm polyp was found in the mid transverse colon which was removed and the pathology showed tubular adenoma..  She was referred to surgeon Dr. Michaell Cowing, and underwent right-sided hemicolectomy on October 22, 2022.  Surgical pathology showed T3 lesion, or 39 lymph nodes were negative.  Tumor was grade 2, lymphovascular invasion was negative, perineural invasion was negative.  Surgical margins were negative.  She has recovered well from surgery.  She has multiple medical comorbidities, including COPD, not on oxygen, anxiety and depression, history of DVT and PE, not on anticoagulation, hypertension, history of multiple substance abuse, on disability.   MEDICAL HISTORY:  Past Medical History:   Diagnosis Date   Alcohol abuse    Anemia    Anxiety    Arthritis    Cancer of hepatic flexure    COPD (chronic obstructive pulmonary disease)    Depression    DVT (deep venous thrombosis) 12/24/2013   GERD (gastroesophageal reflux disease)    History of kidney stones    Hypertension    Insomnia    Other pulmonary embolism without acute cor pulmonale 03/05/2011   Indication: Recurrent Pulmonary emboli (2006 and 2012)   Duration Life long       PE (pulmonary embolism) 08/23/2004   Polysubstance abuse    Current smoking and alcohol. 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 Marijuna not taking since 7 month.    Sleep apnea    Weight loss    due to anxiety    SURGICAL HISTORY: Past Surgical History:  Procedure Laterality Date   ABDOMINAL HYSTERECTOMY     BIOPSY  09/27/2022   Procedure: BIOPSY;  Surgeon: Willis Modena, MD;  Location: [facility];  Service: Gastroenterology;;   COLONOSCOPY WITH PROPOFOL Bilateral 09/27/2022   Procedure: COLONOSCOPY WITH PROPOFOL;  Surgeon: Willis Modena, MD;  Location: [facility];  Service: Gastroenterology;  Laterality: Bilateral;   CYST EXCISION     HEMOSTASIS CLIP PLACEMENT  09/27/2022   Procedure: HEMOSTASIS CLIP PLACEMENT;  Surgeon: Willis Modena, MD;  Location: [facility];  Service: Gastroenterology;;   LITHOTRIPSY     MICROLARYNGOSCOPY Left 06/20/2014   Procedure: MICROLARYNGOSCOPY WITH REMOVAL OF LEFT VOCAL CORD MASS;  Surgeon: Suzanna Obey, MD;  Location[facility] OR;  Service: ENT;  Laterality: Left;   POLYPECTOMY  09/27/2022   Procedure: POLYPECTOMY;  Surgeon: Willis Modena, MD;  Location: [facility];  Service: Gastroenterology;;   SUBMUCOSAL TATTOO INJECTION  09/27/2022   Procedure: SUBMUCOSAL TATTOO INJECTION;  Surgeon: Willis Modena, MD;  Location: [facility];  Service: Gastroenterology;;    SOCIAL HISTORY: Social History   Socioeconomic History   Marital status: Married    Spouse name: Not on file   Number of  children: 6   Years of education: Not on file   Highest education level: Not on file  Occupational History   Occupation: Psychiatric nurse   Occupation: housekeeping  Tobacco Use   Smoking status: Every Day    Packs/day: 0.50    Years: 31.00    Additional pack years: 0.00    Total pack years: 15.50    Types: Cigarettes   Smokeless tobacco: Never   Tobacco comments:    TO LET DOCTOR KNOW WHEN READY TO QUIT.  Vaping Use   Vaping Use: Never used  Substance and Sexual Activity   Alcohol use: Yes    Comment: she used to be a heavy drinker,cut back significantly in 09/2022   Drug use: Not Currently    Frequency: 7.0 times per week    Types: Marijuana, "Crack" cocaine   Sexual activity: Yes    Birth control/protection: None  Other Topics Concern   Not on file  Social History Narrative   Single, does not exercise regularly   Social Determinants of Health   Financial Resource Strain: Not on file  Food Insecurity: No Food Insecurity (10/24/2022)   Hunger Vital Sign    Worried About Running Out of Food in the Last Year: Never true    Ran Out of Food in the Last Year: Never true  Transportation Needs: No Transportation Needs (10/24/2022)   PRAPARE - Administrator, Civil Service (Medical): No    Lack of Transportation (Non-Medical): No  Physical Activity: Not on file  Stress: Not on file  Social Connections: Not on file  Intimate Partner Violence: Not At Risk (10/24/2022)   Humiliation, Afraid, Rape, and Kick questionnaire    Fear of Current or Ex-Partner: No    Emotionally Abused: No    Physically Abused: No    Sexually Abused: No    FAMILY HISTORY: Family History  Problem Relation Age of Onset   Heart disease Mother    Hyperlipidemia Mother    Hypertension Mother    Coronary artery disease Sister    Aneurysm Brother 82   Pulmonary embolism Brother    Aneurysm Paternal Aunt 22       Died    Heart disease Maternal Grandmother    Cancer Paternal Grandmother         colon cancer    ALLERGIES:  has No Known Allergies.  MEDICATIONS:  Current Outpatient Medications  Medication Sig Dispense Refill   amitriptyline (ELAVIL) 25 MG tablet Take 1 tablet (25 mg total) by mouth at bedtime. (Patient taking differently: Take 50 mg by mouth at bedtime.) 30 tablet 5   gabapentin (NEURONTIN) 800 MG tablet Take 800 mg by mouth 3 (three) times daily as needed (pain).     Ibuprofen-diphenhydrAMINE HCl 200-25 MG CAPS Take 1 tablet by mouth at bedtime as needed (sleep).     mirtazapine (REMERON) 30 MG tablet Take 30 mg by mouth at bedtime.     omeprazole (PRILOSEC) 40 MG capsule Take 40 mg by mouth 2 (two) times daily as needed (acid reflux).     oxyCODONE-acetaminophen (PERCOCET) 10-325 MG tablet Take  0.5-1 tablets by mouth every 6 (six) hours as needed for pain. 20 tablet 0   QUEtiapine (SEROQUEL) 200 MG tablet Take 200 mg by mouth at bedtime.     Tetrahydrozoline HCl (VISINE OP) Place 2 drops into both eyes as needed (for dry eyes). Reported on 09/09/2015     tiZANidine (ZANAFLEX) 4 MG capsule Take 4 mg by mouth 3 (three) times daily as needed for muscle spasms.     No current facility-administered medications for this visit.    REVIEW OF SYSTEMS:   Constitutional: Denies fevers, chills or abnormal night sweats Eyes: Denies blurriness of vision, double vision or watery eyes Ears, nose, mouth, throat, and face: Denies mucositis or sore throat Respiratory: Denies cough, dyspnea or wheezes Cardiovascular: Denies palpitation, chest discomfort or lower extremity swelling Gastrointestinal:  Denies nausea, heartburn or change in bowel habits Skin: Denies abnormal skin rashes Lymphatics: Denies new lymphadenopathy or easy bruising Neurological:Denies numbness, tingling or new weaknesses Behavioral/Psych: Mood is stable, no new changes  All other systems were reviewed with the patient and are negative.  PHYSICAL EXAMINATION: ECOG PERFORMANCE STATUS: 1 - Symptomatic but  completely ambulatory  Vitals:   12/10/22 1533  BP: (!) 133/94  Pulse: 74  Resp: 15  Temp: 98.1 F (36.7 C)  SpO2: 100%   Filed Weights   12/10/22 1533  Weight: 102 lb 14.4 oz (46.7 kg)    GENERAL:alert, no distress and comfortable SKIN: skin color, texture, turgor are normal, no rashes or significant lesions EYES: normal, conjunctiva are pink and non-injected, sclera clear OROPHARYNX:no exudate, no erythema and lips, buccal mucosa, and tongue normal  NECK: supple, thyroid normal size, non-tender, without nodularity LYMPH:  no palpable lymphadenopathy in the cervical, axillary or inguinal LUNGS: clear to auscultation and percussion with normal breathing effort HEART: regular rate & rhythm and no murmurs and no lower extremity edema ABDOMEN:abdomen soft, non-tender and normal bowel sounds Musculoskeletal:no cyanosis of digits and no clubbing  PSYCH: alert & oriented x 3 with fluent speech NEURO: no focal motor/sensory deficits  LABORATORY DATA:  I have reviewed the data as listed    Latest Ref Rng & Units 10/23/2022    4:47 AM 10/11/2022    9:30 AM 09/27/2022    7:17 AM  CBC  WBC 4.0 - 10.5 K/uL 11.5  7.3    Hemoglobin 12.0 - 15.0 g/dL 78.2  95.6  21.3   Hematocrit 36.0 - 46.0 % 32.3  38.0  42.0   Platelets 150 - 400 K/uL 185  225      @  RADIOGRAPHIC STUDIES: I have personally reviewed the radiological images as listed and agreed with the findings in the report. No results found.  ASSESSMENT & PLAN:  62 year old female  Right-sided colon cancer, stage II, pT3N0M0, MSS -Patient presented with lower GI bleeding -I reviewed her colonoscopy, biopsy, staging CT scan images, and the surgical pathology results with patient in great detail. -Given the stage II disease, without any high risk features, I do not recommend adjuvant chemotherapy. -We discussed the role of circulating tumor DNA for monitoring --I discussed the risk of cancer recurrence in the future. I  discussed the surveillance plan, which is a physical exam and lab test (including CBC, CMP and CEA) every 3 months for the first 2 years, then every 6-12 months, colonoscopy in one year, and surveilliance CT scan every 6-12 month for up to 5 year.  -I will see her back in 3 months, plan to repeat a surveillance CT  scan in February 2025  2. HTN, COPD, anxiety and depression, history of alcohol and other substance abuse -Follow-up with PCP and continue medications  PLAN -I do not recommend adjuvant chemotherapy -Continue colon cancer surveillance -Lab and follow-up in 3 months   All questions were answered. The patient knows to call the clinic with any problems, questions or concerns. I spent 40 minutes counseling the patient face to face. The total time spent in the appointment was 50 minutes and more than 50% was on counseling.     Malachy Mood, MD 12/10/2022

## 2022-12-16 DIAGNOSIS — Z79899 Other long term (current) drug therapy: Secondary | ICD-10-CM | POA: Diagnosis not present

## 2022-12-20 DIAGNOSIS — Z79899 Other long term (current) drug therapy: Secondary | ICD-10-CM | POA: Diagnosis not present

## 2023-01-10 DIAGNOSIS — Z79899 Other long term (current) drug therapy: Secondary | ICD-10-CM | POA: Diagnosis not present

## 2023-01-12 DIAGNOSIS — Z79899 Other long term (current) drug therapy: Secondary | ICD-10-CM | POA: Diagnosis not present

## 2023-01-14 DIAGNOSIS — C189 Malignant neoplasm of colon, unspecified: Secondary | ICD-10-CM | POA: Diagnosis not present

## 2023-01-14 DIAGNOSIS — R634 Abnormal weight loss: Secondary | ICD-10-CM | POA: Diagnosis not present

## 2023-01-14 DIAGNOSIS — Z780 Asymptomatic menopausal state: Secondary | ICD-10-CM | POA: Diagnosis not present

## 2023-01-14 DIAGNOSIS — Z79899 Other long term (current) drug therapy: Secondary | ICD-10-CM | POA: Diagnosis not present

## 2023-01-14 DIAGNOSIS — E559 Vitamin D deficiency, unspecified: Secondary | ICD-10-CM | POA: Diagnosis not present

## 2023-01-14 DIAGNOSIS — F331 Major depressive disorder, recurrent, moderate: Secondary | ICD-10-CM | POA: Diagnosis not present

## 2023-01-14 DIAGNOSIS — Z681 Body mass index (BMI) 19 or less, adult: Secondary | ICD-10-CM | POA: Diagnosis not present

## 2023-01-14 DIAGNOSIS — K219 Gastro-esophageal reflux disease without esophagitis: Secondary | ICD-10-CM | POA: Diagnosis not present

## 2023-01-19 DIAGNOSIS — Z79899 Other long term (current) drug therapy: Secondary | ICD-10-CM | POA: Diagnosis not present

## 2023-01-24 DIAGNOSIS — Z79899 Other long term (current) drug therapy: Secondary | ICD-10-CM | POA: Diagnosis not present

## 2023-03-11 ENCOUNTER — Inpatient Hospital Stay: Payer: MEDICAID | Admitting: Hematology

## 2023-03-11 ENCOUNTER — Inpatient Hospital Stay: Payer: MEDICAID

## 2023-03-11 ENCOUNTER — Telehealth: Payer: Self-pay | Admitting: Hematology

## 2023-03-20 NOTE — Assessment & Plan Note (Deleted)
stage II, pT3N0M0, MSS  -Presented with lower GI bleeding, diagnosed in February 2024 -Due to the overall low risk disease, adjuvant chemotherapy is not recommended -Will continue colon cancer screening with  physical exam and lab test (including CBC, CMP and CEA) every 3 months for the first 2 years, then every 6-12 months, colonoscopy in one year, and surveilliance CT scan every 6-12 month for up to 5 year.

## 2023-03-21 ENCOUNTER — Inpatient Hospital Stay: Payer: MEDICAID | Admitting: Hematology

## 2023-03-21 ENCOUNTER — Inpatient Hospital Stay: Payer: MEDICAID | Attending: Hematology

## 2023-03-21 DIAGNOSIS — C182 Malignant neoplasm of ascending colon: Secondary | ICD-10-CM

## 2023-03-21 NOTE — Progress Notes (Deleted)
[facility]   Telephone:(336) [phone] Fax:(336) [phone]   Clinic Follow up Note   Patient Care Team: Courtney Paris, NP as PCP - General (Nurse Practitioner) Karie Soda, MD as Consulting Physician (General Surgery) Willis Modena, MD as Consulting Physician (Gastroenterology)  Date of Service:  03/21/2023  CHIEF COMPLAINT: f/u of Cancer of right colon   CURRENT THERAPY:  Surveillance  ASSESSMENT: *** Emily Cameron is a 62 y.o. female with   No problem-specific Assessment & Plan notes found for this encounter.  ***   PLAN:    SUMMARY OF ONCOLOGIC HISTORY: Oncology History Overview Note   Cancer Staging  Cancer of right colon [facility]) Staging form: Colon and Rectum, AJCC 8th Edition - Pathologic stage from 10/22/2022: Stage IIA (pT3, pN0, cM0) - Signed by Malachy Mood, MD on 11/07/2022 Stage prefix: Initial diagnosis Total positive nodes: 0 Histologic grading system: 4 grade system Histologic grade (G): G2 Residual tumor (R): R0 - None     Cancer of right colon (HCC)  09/08/2022 Imaging    IMPRESSION: 1. Circumferential mass in the transverse limb of the hepatic flexure consistent with colon cancer. Possible synchronous lesion versus artifact in the rectosigmoid junction. Surgical consultation and/or colonoscopy recommended for further evaluation. 2. No inflammatory changes or obstruction in the bowel. No evidence of acute diverticulitis.     10/05/2022 Initial Diagnosis   Cancer of right colon (HCC)   10/06/2022 Imaging    IMPRESSION: 1. Stable CT of the chest. No specific findings identified to suggest metastatic disease to the chest. 2. Unchanged appearance of scarring, architectural distortion, and volume loss within the lateral left base and apical segment of the right upper lobe. 3. Stable appearance of small right upper lobe lung nodules which are likely postinflammatory in etiology. 4. Coronary artery calcifications. 5. Aortic  Atherosclerosis (ICD10-I70.0) and Emphysema (ICD10-J43.9).   10/22/2022 Cancer Staging   Staging form: Colon and Rectum, AJCC 8th Edition - Pathologic stage from 10/22/2022: Stage IIA (pT3, pN0, cM0) - Signed by Malachy Mood, MD on 11/07/2022 Stage prefix: Initial diagnosis Total positive nodes: 0 Histologic grading system: 4 grade system Histologic grade (G): G2 Residual tumor (R): R0 - None   10/22/2022 Pathology Results    FINAL MICROSCOPIC DIAGNOSIS:  A. COLON, PROXIMAL RIGHT, RESECTION: Invasive colonic adenocarcinoma, 6 cm Carcinoma extends into pericolonic connective tissue (pT3) All surgical margins negative for carcinoma Thirty-nine lymph nodes negative for metastatic carcinoma (0/39) (pN0) Benign appendix See oncology table  ONCOLOGY TABLE: COLON AND RECTUM, CARCINOMA:  Resection Procedure: Right colectomy Tumor Site: Proximal transverse colon Tumor Size: 6 x 4.5 cm Macroscopic Tumor Perforation: Not identified Histologic Type: Colorectal adenocarcinoma, not otherwise specified Histologic Grade: Moderately differentiated, G2 Multiple Primary Sites: Not applicable Tumor Extension: Into pericolonic connective tissue Lymphovascular Invasion: Not identified Perineural Invasion: Not identified Treatment Effect: No known presurgical therapy Margins:      Margin Status for Invasive Carcinoma: All margins negative for carcinoma Regional Lymph Nodes:      Number of Lymph Nodes with Tumor: 0      Number of Lymph Nodes Examined: 39 Tumor Deposits: Not identified Distant Metastasis:      Distant Site(s) Involved: Not applicable Pathologic Stage Classification (pTNM, AJCC 8th Edition): pT3, pN0 Ancillary Studies: MMR and MSI are pending Representative Tumor Block: A3-A7 (v4.2.0.1)       INTERVAL HISTORY: *** Emily Cameron is here for a follow up of Cancer of right colon . She was last seen by me on 12/10/2022.  She presents to the clinic     All other systems were reviewed  with the patient and are negative.  MEDICAL HISTORY:  Past Medical History:  Diagnosis Date   Alcohol abuse    Anemia    Anxiety    Arthritis    Cancer of hepatic flexure (HCC)    COPD (chronic obstructive pulmonary disease) (HCC)    Depression    DVT (deep venous thrombosis) (HCC) 12/24/2013   GERD (gastroesophageal reflux disease)    History of kidney stones    Hypertension    Insomnia    Other pulmonary embolism without acute cor pulmonale (HCC) 03/05/2011   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 Marijuna not taking since 7 month.    Sleep apnea    Weight loss    due to anxiety    SURGICAL HISTORY: Past Surgical History:  Procedure Laterality Date   ABDOMINAL HYSTERECTOMY     BIOPSY  09/27/2022   Procedure: BIOPSY;  Surgeon: Willis Modena, MD;  Location: [facility];  Service: Gastroenterology;;   COLONOSCOPY WITH PROPOFOL Bilateral 09/27/2022   Procedure: COLONOSCOPY WITH PROPOFOL;  Surgeon: Willis Modena, MD;  Location: [facility];  Service: Gastroenterology;  Laterality: Bilateral;   CYST EXCISION     HEMOSTASIS CLIP PLACEMENT  09/27/2022   Procedure: HEMOSTASIS CLIP PLACEMENT;  Surgeon: Willis Modena, MD;  Location: [facility];  Service: Gastroenterology;;   LITHOTRIPSY     MICROLARYNGOSCOPY Left 06/20/2014   Procedure: MICROLARYNGOSCOPY WITH REMOVAL OF LEFT VOCAL CORD MASS;  Surgeon: Suzanna Obey, MD;  Location: [facility] OR;  Service: ENT;  Laterality: Left;   POLYPECTOMY  09/27/2022   Procedure: POLYPECTOMY;  Surgeon: Willis Modena, MD;  Location: [facility];  Service: Gastroenterology;;   SUBMUCOSAL TATTOO INJECTION  09/27/2022   Procedure: SUBMUCOSAL TATTOO INJECTION;  Surgeon: Willis Modena, MD;  Location: [facility];  Service: Gastroenterology;;    I have reviewed the  social history and family history with the patient and they are unchanged from previous note.  ALLERGIES:  has No Known Allergies.  MEDICATIONS:  Current Outpatient Medications  Medication Sig Dispense Refill   amitriptyline (ELAVIL) 25 MG tablet Take 1 tablet (25 mg total) by mouth at bedtime. (Patient taking differently: Take 50 mg by mouth at bedtime.) 30 tablet 5   gabapentin (NEURONTIN) 800 MG tablet Take 800 mg by mouth 3 (three) times daily as needed (pain).     Ibuprofen-diphenhydrAMINE HCl 200-25 MG CAPS Take 1 tablet by mouth at bedtime as needed (sleep).     mirtazapine (REMERON) 30 MG tablet Take 30 mg by mouth at bedtime.     omeprazole (PRILOSEC) 40 MG capsule Take 40 mg by mouth 2 (two) times daily as needed (acid reflux).     oxyCODONE-acetaminophen (PERCOCET) 10-325 MG tablet Take 0.5-1 tablets by mouth every 6 (six) hours as needed for pain. 20 tablet 0   QUEtiapine (SEROQUEL) 200 MG tablet Take 200 mg by mouth at bedtime.     Tetrahydrozoline HCl (VISINE OP) Place 2 drops into both eyes as needed (for dry eyes). Reported on 09/09/2015     tiZANidine (ZANAFLEX) 4 MG capsule Take 4 mg by mouth 3 (three) times daily as needed for muscle spasms.     No current facility-administered medications for this visit.  PHYSICAL EXAMINATION: ECOG PERFORMANCE STATUS: {CHL ONC ECOG [phone]}  There were no vitals filed for this visit. Wt Readings from Last 3 Encounters:  12/10/22 102 lb 14.4 oz (46.7 kg)  10/24/22 102 lb 1.2 oz (46.3 kg)  10/11/22 99 lb 6.4 oz (45.1 kg)    {Only keep what was examined. If exam not performed, can use .CEXAM } GENERAL:alert, no distress and comfortable SKIN: skin color, texture, turgor are normal, no rashes or significant lesions EYES: normal, Conjunctiva are pink and non-injected, sclera clear {OROPHARYNX:no exudate, no erythema and lips, buccal mucosa, and tongue normal}  NECK: supple, thyroid normal size, non-tender, without  nodularity LYMPH:  no palpable lymphadenopathy in the cervical, axillary {or inguinal} LUNGS: clear to auscultation and percussion with normal breathing effort HEART: regular rate & rhythm and no murmurs and no lower extremity edema ABDOMEN:abdomen soft, non-tender and normal bowel sounds Musculoskeletal:no cyanosis of digits and no clubbing  NEURO: alert & oriented x 3 with fluent speech, no focal motor/sensory deficits  LABORATORY DATA:  I have reviewed the data as listed    Latest Ref Rng & Units 10/23/2022    4:47 AM 10/11/2022    9:30 AM 09/27/2022    7:17 AM  CBC  WBC 4.0 - 10.5 K/uL 11.5  7.3    Hemoglobin 12.0 - 15.0 g/dL 63.0  16.0  10.9   Hematocrit 36.0 - 46.0 % 32.3  38.0  42.0   Platelets 150 - 400 K/uL 185  225          Latest Ref Rng & Units 10/23/2022    4:47 AM 10/11/2022    9:30 AM 09/27/2022    7:17 AM  CMP  Glucose 70 - 99 mg/dL 84  80  77   BUN 6 - 20 mg/dL 8  12  5    Creatinine 0.44 - 1.00 mg/dL 3.23  5.57  3.22   Sodium 135 - 145 mmol/L 141  141  143   Potassium 3.5 - 5.1 mmol/L 3.1  3.6  3.1   Chloride 98 - 111 mmol/L 102  108  105   CO2 22 - 32 mmol/L 30  28    Calcium 8.9 - 10.3 mg/dL 8.9  8.5        RADIOGRAPHIC STUDIES: I have personally reviewed the radiological images as listed and agreed with the findings in the report. No results found.    No orders of the defined types were placed in this encounter.  All questions were answered. The patient knows to call the clinic with any problems, questions or concerns. No barriers to learning was detected. The total time spent in the appointment was {CHL ONC TIME VISIT - GURKY:[id number]}.     Salome Holmes, CMA 03/21/2023   I, Monica Martinez, CMA, am acting as scribe for Malachy Mood, MD.   {Add scribe attestation statement}

## 2023-03-22 ENCOUNTER — Telehealth: Payer: Self-pay | Admitting: Hematology

## 2023-03-23 NOTE — Progress Notes (Deleted)
Patient Care Team: Courtney Paris, NP as PCP - General (Nurse Practitioner) Karie Soda, MD as Consulting Physician (General Surgery) Willis Modena, MD as Consulting Physician (Gastroenterology)  Clinic Day:  03/23/2023  Referring physician: Courtney Paris, NP  ASSESSMENT & PLAN:   Assessment & Plan: Cancer of right colon [facility]) Continue surveillance for recurrent and metastatic disease -CBC, CMP, CEA every 3 months -repeat CT abdomen/pelvis in 09/2023.     The patient understands the plans discussed today and is in agreement with them.  She knows to contact our office if she develops concerns prior to her next appointment.  I provided *** minutes of face-to-face time during this encounter and > 50% was spent counseling as documented under my assessment and plan.    Emily Jews, NP  [facility] AT [facility] [address] [place] [address] Dept: [phone] Dept Fax: [phone]   No orders of the defined types were placed in this encounter.     CHIEF COMPLAINT:  CC: right colon cancer  Current Treatment:  surveillance   INTERVAL HISTORY:  Sirine is here today for repeat clinical assessment. She denies fevers or chills. She denies pain. Her appetite is good. Her weight {Weight change:10426}.  I have reviewed the past medical history, past surgical history, social history and family history with the patient and they are unchanged from previous note.  ALLERGIES:  has No Known Allergies.  MEDICATIONS:  Current Outpatient Medications  Medication Sig Dispense Refill   amitriptyline (ELAVIL) 25 MG tablet Take 1 tablet (25 mg total) by mouth at bedtime. (Patient taking differently: Take 50 mg by mouth at bedtime.) 30 tablet 5   gabapentin (NEURONTIN) 800 MG tablet Take 800 mg by mouth 3 (three) times daily as needed (pain).     Ibuprofen-diphenhydrAMINE HCl 200-25 MG CAPS Take 1 tablet by mouth at bedtime  as needed (sleep).     mirtazapine (REMERON) 30 MG tablet Take 30 mg by mouth at bedtime.     omeprazole (PRILOSEC) 40 MG capsule Take 40 mg by mouth 2 (two) times daily as needed (acid reflux).     oxyCODONE-acetaminophen (PERCOCET) 10-325 MG tablet Take 0.5-1 tablets by mouth every 6 (six) hours as needed for pain. 20 tablet 0   QUEtiapine (SEROQUEL) 200 MG tablet Take 200 mg by mouth at bedtime.     Tetrahydrozoline HCl (VISINE OP) Place 2 drops into both eyes as needed (for dry eyes). Reported on 09/09/2015     tiZANidine (ZANAFLEX) 4 MG capsule Take 4 mg by mouth 3 (three) times daily as needed for muscle spasms.     No current facility-administered medications for this visit.    HISTORY OF PRESENT ILLNESS:   Oncology History Overview Note   Cancer Staging  Cancer of right colon [facility]) Staging form: Colon and Rectum, AJCC 8th Edition - Pathologic stage from 10/22/2022: Stage IIA (pT3, pN0, cM0) - Signed by Malachy Mood, MD on 11/07/2022 Stage prefix: Initial diagnosis Total positive nodes: 0 Histologic grading system: 4 grade system Histologic grade (G): G2 Residual tumor (R): R0 - None     Cancer of right colon (HCC)  09/08/2022 Imaging    IMPRESSION: 1. Circumferential mass in the transverse limb of the hepatic flexure consistent with colon cancer. Possible synchronous lesion versus artifact in the rectosigmoid junction. Surgical consultation and/or colonoscopy recommended for further evaluation. 2. No inflammatory changes or obstruction in the bowel. No evidence of acute diverticulitis.  10/05/2022 Initial Diagnosis   Cancer of right colon (HCC)   10/06/2022 Imaging    IMPRESSION: 1. Stable CT of the chest. No specific findings identified to suggest metastatic disease to the chest. 2. Unchanged appearance of scarring, architectural distortion, and volume loss within the lateral left base and apical segment of the right upper lobe. 3. Stable appearance of small right  upper lobe lung nodules which are likely postinflammatory in etiology. 4. Coronary artery calcifications. 5. Aortic Atherosclerosis (ICD10-I70.0) and Emphysema (ICD10-J43.9).   10/22/2022 Cancer Staging   Staging form: Colon and Rectum, AJCC 8th Edition - Pathologic stage from 10/22/2022: Stage IIA (pT3, pN0, cM0) - Signed by Malachy Mood, MD on 11/07/2022 Stage prefix: Initial diagnosis Total positive nodes: 0 Histologic grading system: 4 grade system Histologic grade (G): G2 Residual tumor (R): R0 - None   10/22/2022 Pathology Results    FINAL MICROSCOPIC DIAGNOSIS:  A. COLON, PROXIMAL RIGHT, RESECTION: Invasive colonic adenocarcinoma, 6 cm Carcinoma extends into pericolonic connective tissue (pT3) All surgical margins negative for carcinoma Thirty-nine lymph nodes negative for metastatic carcinoma (0/39) (pN0) Benign appendix See oncology table  ONCOLOGY TABLE: COLON AND RECTUM, CARCINOMA:  Resection Procedure: Right colectomy Tumor Site: Proximal transverse colon Tumor Size: 6 x 4.5 cm Macroscopic Tumor Perforation: Not identified Histologic Type: Colorectal adenocarcinoma, not otherwise specified Histologic Grade: Moderately differentiated, G2 Multiple Primary Sites: Not applicable Tumor Extension: Into pericolonic connective tissue Lymphovascular Invasion: Not identified Perineural Invasion: Not identified Treatment Effect: No known presurgical therapy Margins:      Margin Status for Invasive Carcinoma: All margins negative for carcinoma Regional Lymph Nodes:      Number of Lymph Nodes with Tumor: 0      Number of Lymph Nodes Examined: 39 Tumor Deposits: Not identified Distant Metastasis:      Distant Site(s) Involved: Not applicable Pathologic Stage Classification (pTNM, AJCC 8th Edition): pT3, pN0 Ancillary Studies: MMR and MSI are pending Representative Tumor Block: A3-A7 (v4.2.0.1)        REVIEW OF SYSTEMS:   Constitutional: Denies fevers, chills or abnormal  weight loss Eyes: Denies blurriness of vision Ears, nose, mouth, throat, and face: Denies mucositis or sore throat Respiratory: Denies cough, dyspnea or wheezes Cardiovascular: Denies palpitation, chest discomfort or lower extremity swelling Gastrointestinal:  Denies nausea, heartburn or change in bowel habits Skin: Denies abnormal skin rashes Lymphatics: Denies new lymphadenopathy or easy bruising Neurological:Denies numbness, tingling or new weaknesses Behavioral/Psych: Mood is stable, no new changes  All other systems were reviewed with the patient and are negative.   VITALS:  Last menstrual period 01/26/2004.  Wt Readings from Last 3 Encounters:  12/10/22 102 lb 14.4 oz (46.7 kg)  10/24/22 102 lb 1.2 oz (46.3 kg)  10/11/22 99 lb 6.4 oz (45.1 kg)    There is no height or weight on file to calculate BMI.  Performance status (ECOG): {CHL ONC [id number]  PHYSICAL EXAM:   GENERAL:alert, no distress and comfortable SKIN: skin color, texture, turgor are normal, no rashes or significant lesions EYES: normal, Conjunctiva are pink and non-injected, sclera clear OROPHARYNX:no exudate, no erythema and lips, buccal mucosa, and tongue normal  NECK: supple, thyroid normal size, non-tender, without nodularity LYMPH:  no palpable lymphadenopathy in the cervical, axillary or inguinal LUNGS: clear to auscultation and percussion with normal breathing effort HEART: regular rate & rhythm and no murmurs and no lower extremity edema ABDOMEN:abdomen soft, non-tender and normal bowel sounds Musculoskeletal:no cyanosis of digits and no clubbing  NEURO: alert & oriented x  3 with fluent speech, no focal motor/sensory deficits  LABORATORY DATA:  I have reviewed the data as listed    Component Value Date/Time   NA 141 10/23/2022 0447   NA 142 05/11/2018 1529   K 3.1 (L) 10/23/2022 0447   CL 102 10/23/2022 0447   CO2 30 10/23/2022 0447   GLUCOSE 84 10/23/2022 0447   BUN 8 10/23/2022 0447    BUN 10 05/11/2018 1529   CREATININE 0.85 10/23/2022 0447   CREATININE 0.68 09/12/2014 1240   CALCIUM 8.9 10/23/2022 0447   PROT 7.7 09/08/2022 1048   PROT 7.1 05/11/2018 1529   ALBUMIN 4.0 09/08/2022 1048   ALBUMIN 4.2 05/11/2018 1529   AST 23 09/08/2022 1048   ALT 15 09/08/2022 1048   ALKPHOS 89 09/08/2022 1048   BILITOT 0.4 09/08/2022 1048   BILITOT <0.2 05/11/2018 1529   GFRNONAA >60 10/23/2022 0447   GFRNONAA >89 09/24/2013 1500   GFRAA 112 05/11/2018 1529   GFRAA >89 09/24/2013 1500    No results found for: "SPEP", "UPEP"  Lab Results  Component Value Date   WBC 11.5 (H) 10/23/2022   NEUTROABS 2.7 09/08/2022   HGB 11.1 (L) 10/23/2022   HCT 32.3 (L) 10/23/2022   MCV 92.3 10/23/2022   PLT 185 10/23/2022      Chemistry      Component Value Date/Time   NA 141 10/23/2022 0447   NA 142 05/11/2018 1529   K 3.1 (L) 10/23/2022 0447   CL 102 10/23/2022 0447   CO2 30 10/23/2022 0447   BUN 8 10/23/2022 0447   BUN 10 05/11/2018 1529   CREATININE 0.85 10/23/2022 0447   CREATININE 0.68 09/12/2014 1240      Component Value Date/Time   CALCIUM 8.9 10/23/2022 0447   ALKPHOS 89 09/08/2022 1048   AST 23 09/08/2022 1048   ALT 15 09/08/2022 1048   BILITOT 0.4 09/08/2022 1048   BILITOT <0.2 05/11/2018 1529       RADIOGRAPHIC STUDIES: I have personally reviewed the radiological images as listed and agreed with the findings in the report. No results found.

## 2023-03-23 NOTE — Assessment & Plan Note (Deleted)
Continue surveillance for recurrent and metastatic disease -CBC, CMP, CEA every 3 months -repeat CT abdomen/pelvis in 09/2023.

## 2023-03-24 ENCOUNTER — Inpatient Hospital Stay: Payer: MEDICAID | Admitting: Nurse Practitioner

## 2023-03-24 ENCOUNTER — Telehealth: Payer: Self-pay | Admitting: Nurse Practitioner

## 2023-03-24 ENCOUNTER — Inpatient Hospital Stay: Payer: MEDICAID

## 2023-03-24 DIAGNOSIS — C182 Malignant neoplasm of ascending colon: Secondary | ICD-10-CM

## 2023-03-24 NOTE — Progress Notes (Addendum)
Patient Care Team: Courtney Paris, NP as PCP - General (Nurse Practitioner) Karie Soda, MD as Consulting Physician (General Surgery) Willis Modena, MD as Consulting Physician (Gastroenterology)  Clinic Day:  03/25/2023  Referring physician: Courtney Paris, NP  ASSESSMENT & PLAN:   Assessment & Plan: Cancer of right colon [facility]) From cancer perspective, patient is stable and doing well.  Reviewed labs. K - 3.1, CBC essentially normal.  Continue surveillance for recurrent and metastatic disease -CBC, CMP, CEA every 3 months -repeat CT abdomen/pelvis in 09/2023.    Unintentional weight loss Six pound weight gain since last visit.  -refill megace and mirtazapine to maintain robust appetite.  -reassess in 3 months   MDD (major depressive disorder) Patient tearful and anxious during today's visit.  -concern for homelessness in near future. -has been out of all medications for unclear period of time.  -refills for mirtazapine and seroquel sent to pharmacy -social work to contact patient on 03/28/2023   Loss of appetite Six pound weight gain since last visit.  -refill megace and mirtazapine to maintain robust appetite.  -reassess in 3 months   Irregular heart beat 03/25/2023 - ECG showing premature atrial and ventricular contractions and LVH, unclear etiology. May be due to anxiety or sudden discontinuation of long term medications.  -advised patient to seek emergency care if she develops chest pain and/or shortness of breath.  -she is agreeable to these instructions.   Hypokalemia 03/25/2023 - K - 3.1. add K-dur 10 mEq daily  -recheck CMP in 3 months and adjust treatment as indicated   Chronic pain syndrome Referral placed to pain management provider for further management.   Adjustment insomnia Long standing, likely secondary to anxiety,  -renew mirtazapine 30 mg -renew seroquel 200 mg at bedtime    Plan: From cancer perspective, patient is stable and doing well.   Reviewed labs. K - 3.1, CBC essentially normal.  Continue surveillance for recurrent and metastatic disease Repeat CBC, CMP, and CEA in 3 months.  Follow up in 3 months  Surveillance CT CAP in 09/2023. Refer to pain management provider.  Social work to contact patient on 03/28/2023  The patient understands the plans discussed today and is in agreement with them.  She knows to contact our office if she develops concerns prior to her next appointment.  I provided 45 minutes of face-to-face time during this encounter and > 50% was spent counseling as documented under my assessment and plan.    Carlean Jews, NP  [place] CANCER CENTER AT [facility] [address] [place] [address] Dept: [phone] Dept Fax: [phone]   Orders Placed This Encounter  Procedures   Ambulatory referral to Pain Clinic    Referral Priority:   Routine    Referral Type:   Consultation    Referral Reason:   Specialty Services Required    Requested Specialty:   Pain Medicine    Number of Visits Requested:   1   EKG 12-Lead    Ordered by an unspecified provider       CHIEF COMPLAINT:  CC: right colon cancer  Current Treatment:  surveillance   INTERVAL HISTORY:  Magdalyn is here today for repeat clinical assessment. She denies fevers or chills. She denies pain. Her appetite is good. Her weight has increased 3 pounds over last 3 months .  Right colon cancer : From cancer perspective, patient is stable and doing well.  Reviewed labs. K - 3.1, CBC essentially normal.  Continue surveillance for recurrent and metastatic disease  I have reviewed the past medical history, past surgical history, social history and family history with the patient and they are unchanged from previous note.  ALLERGIES:  has No Known Allergies.  MEDICATIONS:  Current Outpatient Medications  Medication Sig Dispense Refill   ferrous sulfate (FEROSUL) 325 (65 FE) MG tablet 1  tablet Orally Three times a Week for 90 days     Ibuprofen-diphenhydrAMINE HCl 200-25 MG CAPS Take 1 tablet by mouth at bedtime as needed (sleep).     oxyCODONE-acetaminophen (PERCOCET) 10-325 MG tablet Take 0.5-1 tablets by mouth every 6 (six) hours as needed for pain. 20 tablet 0   potassium chloride (KLOR-CON) 10 MEQ tablet Take 1 tablet (10 mEq total) by mouth daily. 30 tablet 2   Tetrahydrozoline HCl (VISINE OP) Place 2 drops into both eyes as needed (for dry eyes). Reported on 09/09/2015     tiZANidine (ZANAFLEX) 4 MG capsule Take 4 mg by mouth 3 (three) times daily as needed for muscle spasms.     gabapentin (NEURONTIN) 600 MG tablet Take 1 tablet (600 mg total) by mouth 3 (three) times daily as needed (pain). 90 tablet 3   megestrol (MEGACE ES) 625 MG/5ML suspension Take 5 mLs (625 mg total) by mouth daily. 150 mL 3   mirtazapine (REMERON) 30 MG tablet Take 1 tablet (30 mg total) by mouth at bedtime. 30 tablet 3   omeprazole (PRILOSEC) 40 MG capsule Take 1 capsule (40 mg total) by mouth 2 (two) times daily as needed (acid reflux). 30 capsule 3   QUEtiapine (SEROQUEL) 200 MG tablet Take 1 tablet (200 mg total) by mouth at bedtime. 30 tablet 3   No current facility-administered medications for this visit.    HISTORY OF PRESENT ILLNESS:   Oncology History Overview Note   Cancer Staging  Cancer of right colon [facility]) Staging form: Colon and Rectum, AJCC 8th Edition - Pathologic stage from 10/22/2022: Stage IIA (pT3, pN0, cM0) - Signed by Malachy Mood, MD on 11/07/2022 Stage prefix: Initial diagnosis Total positive nodes: 0 Histologic grading system: 4 grade system Histologic grade (G): G2 Residual tumor (R): R0 - None     Cancer of right colon (HCC)  09/08/2022 Imaging    IMPRESSION: 1. Circumferential mass in the transverse limb of the hepatic flexure consistent with colon cancer. Possible synchronous lesion versus artifact in the rectosigmoid junction. Surgical consultation and/or  colonoscopy recommended for further evaluation. 2. No inflammatory changes or obstruction in the bowel. No evidence of acute diverticulitis.     10/05/2022 Initial Diagnosis   Cancer of right colon (HCC)   10/06/2022 Imaging    IMPRESSION: 1. Stable CT of the chest. No specific findings identified to suggest metastatic disease to the chest. 2. Unchanged appearance of scarring, architectural distortion, and volume loss within the lateral left base and apical segment of the right upper lobe. 3. Stable appearance of small right upper lobe lung nodules which are likely postinflammatory in etiology. 4. Coronary artery calcifications. 5. Aortic Atherosclerosis (ICD10-I70.0) and Emphysema (ICD10-J43.9).   10/22/2022 Cancer Staging   Staging form: Colon and Rectum, AJCC 8th Edition - Pathologic stage from 10/22/2022: Stage IIA (pT3, pN0, cM0) - Signed by Malachy Mood, MD on 11/07/2022 Stage prefix: Initial diagnosis Total positive nodes: 0 Histologic grading system: 4 grade system Histologic grade (G): G2 Residual tumor (R): R0 - None   10/22/2022 Pathology Results    FINAL MICROSCOPIC DIAGNOSIS:  A. COLON, PROXIMAL RIGHT, RESECTION:  Invasive colonic adenocarcinoma, 6 cm Carcinoma extends into pericolonic connective tissue (pT3) All surgical margins negative for carcinoma Thirty-nine lymph nodes negative for metastatic carcinoma (0/39) (pN0) Benign appendix See oncology table  ONCOLOGY TABLE: COLON AND RECTUM, CARCINOMA:  Resection Procedure: Right colectomy Tumor Site: Proximal transverse colon Tumor Size: 6 x 4.5 cm Macroscopic Tumor Perforation: Not identified Histologic Type: Colorectal adenocarcinoma, not otherwise specified Histologic Grade: Moderately differentiated, G2 Multiple Primary Sites: Not applicable Tumor Extension: Into pericolonic connective tissue Lymphovascular Invasion: Not identified Perineural Invasion: Not identified Treatment Effect: No known presurgical  therapy Margins:      Margin Status for Invasive Carcinoma: All margins negative for carcinoma Regional Lymph Nodes:      Number of Lymph Nodes with Tumor: 0      Number of Lymph Nodes Examined: 39 Tumor Deposits: Not identified Distant Metastasis:      Distant Site(s) Involved: Not applicable Pathologic Stage Classification (pTNM, AJCC 8th Edition): pT3, pN0 Ancillary Studies: MMR and MSI are pending Representative Tumor Block: A3-A7 (v4.2.0.1)        REVIEW OF SYSTEMS:   Constitutional: Denies fevers, chills or abnormal weight loss Eyes: Denies blurriness of vision Ears, nose, mouth, throat, and face: Denies mucositis or sore throat Respiratory: Denies cough, dyspnea or wheezes Cardiovascular:patient reports intermittent, left-sided chest discomfort and palpitations.  Gastrointestinal:  Denies nausea, heartburn or change in bowel habits Skin: Denies abnormal skin rashes Lymphatics: Denies new lymphadenopathy or easy bruising Neurological:Denies numbness, tingling or new weaknesses Behavioral/Psych: increased anxiety and worry  All other systems were reviewed with the patient and are negative.   VITALS:   Today's Vitals   03/25/23 1355 03/25/23 1358  BP: 134/89   Pulse: 60   Resp: 13   Temp: 98.4 F (36.9 C)   TempSrc: Oral   Weight: 105 lb 1.6 oz (47.7 kg)   PainSc:  4    Body mass index is 18.04 kg/m.   Wt Readings from Last 3 Encounters:  03/25/23 105 lb 1.6 oz (47.7 kg)  12/10/22 102 lb 14.4 oz (46.7 kg)  10/24/22 102 lb 1.2 oz (46.3 kg)    Body mass index is 18.04 kg/m.  Performance status (ECOG): 1 - Symptomatic but completely ambulatory  PHYSICAL EXAM:   GENERAL:alert, no distress and comfortable SKIN: skin color, texture, turgor are normal, no rashes or significant lesions EYES: normal, Conjunctiva are pink and non-injected, sclera clear OROPHARYNX:no exudate, no erythema and lips, buccal mucosa, and tongue normal  NECK: supple, thyroid normal  size, non-tender, without nodularity LYMPH:  no palpable lymphadenopathy in the cervical, axillary or inguinal LUNGS: clear to auscultation and percussion with normal breathing effort HEART: irregular rate & rhythm and no murmurs and no lower extremity edema. ECG showing left ventricular enlargement. There are premature atrial and ventricular beats.  ABDOMEN:abdomen soft, non-tender and normal bowel sounds Musculoskeletal:no cyanosis of digits and no clubbing  NEURO: alert & oriented x 3 with fluent speech, no focal motor/sensory deficits  LABORATORY DATA:  I have reviewed the data as listed     Component Value Date/Time   NA 142 03/25/2023 1337   NA 142 05/11/2018 1529   K 3.1 (L) 03/25/2023 1337   CL 108 03/25/2023 1337   CO2 28 03/25/2023 1337   GLUCOSE 111 (H) 03/25/2023 1337   BUN 9 03/25/2023 1337   BUN 10 05/11/2018 1529   CREATININE 0.81 03/25/2023 1337   CREATININE 0.68 09/12/2014 1240   CALCIUM 8.9 03/25/2023 1337   PROT 7.4 03/25/2023  1337   PROT 7.1 05/11/2018 1529   ALBUMIN 4.0 03/25/2023 1337   ALBUMIN 4.2 05/11/2018 1529   AST 17 03/25/2023 1337   ALT 13 03/25/2023 1337   ALKPHOS 70 03/25/2023 1337   BILITOT 0.7 03/25/2023 1337   BILITOT <0.2 05/11/2018 1529   GFRNONAA >60 03/25/2023 1337   GFRNONAA >89 09/24/2013 1500   GFRAA 112 05/11/2018 1529   GFRAA >89 09/24/2013 1500     Lab Results  Component Value Date   WBC 7.2 03/25/2023   NEUTROABS 4.5 03/25/2023   HGB 13.6 03/25/2023   HCT 37.2 03/25/2023   MCV 89.6 03/25/2023   PLT 203 03/25/2023      Chemistry      Component Value Date/Time   NA 142 03/25/2023 1337   NA 142 05/11/2018 1529   K 3.1 (L) 03/25/2023 1337   CL 108 03/25/2023 1337   CO2 28 03/25/2023 1337   BUN 9 03/25/2023 1337   BUN 10 05/11/2018 1529   CREATININE 0.81 03/25/2023 1337   CREATININE 0.68 09/12/2014 1240      Component Value Date/Time   CALCIUM 8.9 03/25/2023 1337   ALKPHOS 70 03/25/2023 1337   AST 17 03/25/2023  1337   ALT 13 03/25/2023 1337   BILITOT 0.7 03/25/2023 1337   BILITOT <0.2 05/11/2018 1529      Plan: From cancer perspective, patient is stable and doing well.  Reviewed labs. K - 3.1, CBC essentially normal.  Continue surveillance for recurrent and metastatic disease Repeat CBC, CMP, and CEA in 3 months.  Follow up in 3 months  Surveillance CT CAP in 09/2023. Refer to pain management provider.  Social work to contact patient on 03/28/2023  Addendum I have seen the patient, examined her. I agree with the assessment and and plan and have edited the notes.   Ms. Dolin is doing well from GI standpoint, no clinical concern for cancer recurrence.  However she has been under significant stress due to her social situation, especially housing issues.  She has atypical chest tightness, not exertional, we did the EKG, which were unremarkable.  We will review her medications, and I will give her the information of [facility], so she can call to establish care with them.  Lab reviewed.  Will see her back in 3 months.  Malachy Mood MD 03/25/2023

## 2023-03-25 ENCOUNTER — Inpatient Hospital Stay: Payer: MEDICAID | Attending: Hematology

## 2023-03-25 ENCOUNTER — Inpatient Hospital Stay (HOSPITAL_BASED_OUTPATIENT_CLINIC_OR_DEPARTMENT_OTHER): Payer: MEDICAID | Admitting: Nurse Practitioner

## 2023-03-25 ENCOUNTER — Other Ambulatory Visit: Payer: Self-pay

## 2023-03-25 VITALS — BP 134/89 | HR 60 | Temp 98.4°F | Resp 13 | Wt 105.1 lb

## 2023-03-25 DIAGNOSIS — M25511 Pain in right shoulder: Secondary | ICD-10-CM | POA: Diagnosis not present

## 2023-03-25 DIAGNOSIS — G894 Chronic pain syndrome: Secondary | ICD-10-CM | POA: Insufficient documentation

## 2023-03-25 DIAGNOSIS — R634 Abnormal weight loss: Secondary | ICD-10-CM

## 2023-03-25 DIAGNOSIS — E876 Hypokalemia: Secondary | ICD-10-CM | POA: Insufficient documentation

## 2023-03-25 DIAGNOSIS — R630 Anorexia: Secondary | ICD-10-CM

## 2023-03-25 DIAGNOSIS — R0789 Other chest pain: Secondary | ICD-10-CM | POA: Insufficient documentation

## 2023-03-25 DIAGNOSIS — C182 Malignant neoplasm of ascending colon: Secondary | ICD-10-CM | POA: Insufficient documentation

## 2023-03-25 DIAGNOSIS — I499 Cardiac arrhythmia, unspecified: Secondary | ICD-10-CM | POA: Diagnosis not present

## 2023-03-25 DIAGNOSIS — F5102 Adjustment insomnia: Secondary | ICD-10-CM

## 2023-03-25 DIAGNOSIS — R079 Chest pain, unspecified: Secondary | ICD-10-CM | POA: Diagnosis not present

## 2023-03-25 DIAGNOSIS — F331 Major depressive disorder, recurrent, moderate: Secondary | ICD-10-CM | POA: Diagnosis not present

## 2023-03-25 LAB — COMPREHENSIVE METABOLIC PANEL
ALT: 13 U/L (ref 0–44)
AST: 17 U/L (ref 15–41)
Albumin: 4 g/dL (ref 3.5–5.0)
Alkaline Phosphatase: 70 U/L (ref 38–126)
Anion gap: 6 (ref 5–15)
BUN: 9 mg/dL (ref 6–20)
CO2: 28 mmol/L (ref 22–32)
Calcium: 8.9 mg/dL (ref 8.9–10.3)
Chloride: 108 mmol/L (ref 98–111)
Creatinine, Ser: 0.81 mg/dL (ref 0.44–1.00)
GFR, Estimated: >60 mL/min (ref >60)
Glucose, Bld: 111 mg/dL — ABNORMAL HIGH (ref 70–99)
Potassium: 3.1 mmol/L — ABNORMAL LOW (ref 3.5–5.1)
Sodium: 142 mmol/L (ref 135–145)
Total Bilirubin: 0.7 mg/dL (ref 0.3–1.2)
Total Protein: 7.4 g/dL (ref 6.5–8.1)

## 2023-03-25 LAB — CBC WITH DIFFERENTIAL/PLATELET
Abs Immature Granulocytes: 0.02 10*3/uL (ref 0.00–0.07)
Basophils Absolute: 0 10*3/uL (ref 0.0–0.1)
Basophils Relative: 0 %
Eosinophils Absolute: 0 10*3/uL (ref 0.0–0.5)
Eosinophils Relative: 1 %
HCT: 37.2 % (ref 36.0–46.0)
Hemoglobin: 13.6 g/dL (ref 12.0–15.0)
Immature Granulocytes: 0 %
Lymphocytes Relative: 31 %
Lymphs Abs: 2.2 10*3/uL (ref 0.7–4.0)
MCH: 32.8 pg (ref 26.0–34.0)
MCHC: 36.6 g/dL — ABNORMAL HIGH (ref 30.0–36.0)
MCV: 89.6 fL (ref 80.0–100.0)
Monocytes Absolute: 0.5 10*3/uL (ref 0.1–1.0)
Monocytes Relative: 6 %
Neutro Abs: 4.5 10*3/uL (ref 1.7–7.7)
Neutrophils Relative %: 62 %
Platelets: 203 10*3/uL (ref 150–400)
RBC: 4.15 MIL/uL (ref 3.87–5.11)
RDW: 14.2 % (ref 11.5–15.5)
WBC: 7.2 10*3/uL (ref 4.0–10.5)
nRBC: 0 % (ref 0.0–0.2)

## 2023-03-25 LAB — CEA (IN HOUSE-CHCC): CEA (CHCC-In House): 6.13 ng/mL — ABNORMAL HIGH (ref 0.00–5.00)

## 2023-03-25 MED ORDER — QUETIAPINE FUMARATE 200 MG PO TABS: 200.0000 mg | ORAL_TABLET | Freq: Every day | ORAL | 3 refills | Status: AC

## 2023-03-25 MED ORDER — GABAPENTIN 600 MG PO TABS: 600.0000 mg | ORAL_TABLET | Freq: Three times a day (TID) | ORAL | 3 refills | Status: AC | PRN

## 2023-03-25 MED ORDER — POTASSIUM CHLORIDE ER 10 MEQ PO TBCR: 10.0000 meq | EXTENDED_RELEASE_TABLET | Freq: Every day | ORAL | 2 refills | Status: DC

## 2023-03-25 MED ORDER — MEGESTROL ACETATE 625 MG/5ML PO SUSP: 625.0000 mg | Freq: Every day | ORAL | 3 refills | Status: DC

## 2023-03-25 MED ORDER — OMEPRAZOLE 40 MG PO CPDR: 40.0000 mg | DELAYED_RELEASE_CAPSULE | Freq: Two times a day (BID) | ORAL | 3 refills | Status: AC | PRN

## 2023-03-25 MED ORDER — MIRTAZAPINE 30 MG PO TABS: 30.0000 mg | ORAL_TABLET | Freq: Every day | ORAL | 3 refills | Status: AC

## 2023-03-25 NOTE — Assessment & Plan Note (Signed)
Long standing, likely secondary to anxiety,  -renew mirtazapine 30 mg -renew seroquel 200 mg at bedtime

## 2023-03-25 NOTE — Assessment & Plan Note (Signed)
Referral placed to pain management provider for further management.

## 2023-03-25 NOTE — Assessment & Plan Note (Addendum)
From cancer perspective, patient is stable and doing well.  Reviewed labs. K - 3.1, CBC essentially normal.  Continue surveillance for recurrent and metastatic disease -CBC, CMP, CEA every 3 months -repeat CT abdomen/pelvis in 09/2023.

## 2023-03-25 NOTE — Assessment & Plan Note (Signed)
03/25/2023 - ECG showing premature atrial and ventricular contractions and LVH, unclear etiology. May be due to anxiety or sudden discontinuation of long term medications.  -advised patient to seek emergency care if she develops chest pain and/or shortness of breath.  -she is agreeable to these instructions.

## 2023-03-25 NOTE — Assessment & Plan Note (Signed)
Six pound weight gain since last visit.  -refill megace and mirtazapine to maintain robust appetite.  -reassess in 3 months

## 2023-03-25 NOTE — Assessment & Plan Note (Signed)
03/25/2023 - K - 3.1. add K-dur 10 mEq daily  -recheck CMP in 3 months and adjust treatment as indicated

## 2023-03-25 NOTE — Assessment & Plan Note (Signed)
Patient tearful and anxious during today's visit.  -concern for homelessness in near future. -has been out of all medications for unclear period of time.  -refills for mirtazapine and seroquel sent to pharmacy -social work to contact patient on 03/28/2023

## 2023-03-28 ENCOUNTER — Inpatient Hospital Stay: Payer: MEDICAID | Admitting: Licensed Clinical Social Worker

## 2023-03-28 DIAGNOSIS — C182 Malignant neoplasm of ascending colon: Secondary | ICD-10-CM

## 2023-03-28 NOTE — Progress Notes (Signed)
[facility] CSW Progress Note  Clinical Child psychotherapist contacted patient by phone to discuss concerns regarding housing and managing medical appointments.  Pt is presently under surveillance for colon cancer following treatment.  Pt states she has swelling in her legs and pain which makes it difficult to navigate the steps to get to her 3rd floor walk up apartment, but is unable to find a ground floor apartment and is worried she will not be able to continue to live where she currently is.  Pt also was recently transitioned to a [employer] and reports that all of the providers she had do not accept her new insurance.  As a result pt has been unable to see her providers and when she came to her oncology appointment had not had any of her medications filled for over a month.  Medication refills were sent to the pharmacy by the oncologist.  CSW contacted [facility] who provided contact details for the agency providing case management to pt.  Elmore Guise [phone]) at [facility] verified her agency will be providing case management for pt.  Unfortunately the computer system for the agency was down and Fleet Contras was unable to look up if pt has been assigned a case Production designer, theatre/television/film.  Fleet Contras provided assurance she will contact pt today and either add pt to her own list to manage or will assign a case manager to her to assist with establishing new providers as well as with securing appropriate housing.  Pt instructed to contact CSW if she has not heard from someone by the end of this week.     Rachel Moulds, LCSW Clinical Social Worker [facility]    Patient is participating in a Managed Medicaid Plan:  Yes

## 2023-04-01 ENCOUNTER — Other Ambulatory Visit: Payer: Self-pay

## 2023-04-01 ENCOUNTER — Inpatient Hospital Stay: Payer: MEDICAID | Admitting: Licensed Clinical Social Worker

## 2023-04-01 ENCOUNTER — Telehealth: Payer: Self-pay

## 2023-04-01 ENCOUNTER — Other Ambulatory Visit: Payer: Self-pay | Admitting: Nurse Practitioner

## 2023-04-01 DIAGNOSIS — R630 Anorexia: Secondary | ICD-10-CM

## 2023-04-01 DIAGNOSIS — C182 Malignant neoplasm of ascending colon: Secondary | ICD-10-CM

## 2023-04-01 DIAGNOSIS — R634 Abnormal weight loss: Secondary | ICD-10-CM

## 2023-04-01 NOTE — Telephone Encounter (Signed)
Ok great. If they tell her it's not there , I'm happy to send it again.

## 2023-04-01 NOTE — Telephone Encounter (Signed)
I had sent a refill for this to [facility] on [address]. Does she want me to change the pharmacy?

## 2023-04-01 NOTE — Progress Notes (Signed)
[facility] CSW Progress Note  Visual merchandiser  received a call from pt requesting assistance with a deposit for a new apartment.  Pt is presently is under observation and not in active treatment.  CSW encouraged pt to reach out to the [employer] and [employer]   to inquire about available funds.  CSW also provided pt w/ contact information for [facility] to connect w/ her [facility] case Production designer, theatre/television/film for assistance.    Rachel Moulds, LCSW Clinical Social Worker [facility]    Patient is participating in a Managed Medicaid Plan:  Yes

## 2023-04-01 NOTE — Telephone Encounter (Signed)
Pt called requesting a refill on the megesterol suspension.  Notified Vincent Gros, DNP of pt's request.

## 2023-04-06 ENCOUNTER — Inpatient Hospital Stay: Payer: MEDICAID | Admitting: Licensed Clinical Social Worker

## 2023-04-06 DIAGNOSIS — Z139 Encounter for screening, unspecified: Secondary | ICD-10-CM | POA: Diagnosis not present

## 2023-04-06 NOTE — Progress Notes (Signed)
[facility] CSW Progress Note  Visual merchandiser  received a call from pt informing she did not hear from a case Production designer, theatre/television/film from [facility] and desperately needs assistance with funding to get into her new apartment.  CSW called Fleet Contras at [facility] who verbalized agreement to call pt today to assess formally and connect to housing resources.        Rachel Moulds, LCSW Clinical Social Worker  [facility]    Patient is participating in a Managed Medicaid Plan:  Yes

## 2023-04-20 DIAGNOSIS — F250 Schizoaffective disorder, bipolar type: Secondary | ICD-10-CM | POA: Diagnosis not present

## 2023-05-02 DIAGNOSIS — Z79899 Other long term (current) drug therapy: Secondary | ICD-10-CM | POA: Diagnosis not present

## 2023-05-04 DIAGNOSIS — Z79899 Other long term (current) drug therapy: Secondary | ICD-10-CM | POA: Diagnosis not present

## 2023-05-11 ENCOUNTER — Emergency Department (HOSPITAL_COMMUNITY): Payer: Medicaid Other

## 2023-05-11 ENCOUNTER — Other Ambulatory Visit: Payer: Self-pay

## 2023-05-11 ENCOUNTER — Inpatient Hospital Stay (HOSPITAL_COMMUNITY)
Admission: EM | Admit: 2023-05-11 | Discharge: 2023-05-13 | DRG: 176 | Disposition: A | Discharge status: Home / Self Care | Payer: Medicaid Other | Attending: Internal Medicine | Admitting: Internal Medicine

## 2023-05-11 ENCOUNTER — Encounter (HOSPITAL_COMMUNITY): Payer: Self-pay | Admitting: Emergency Medicine

## 2023-05-11 DIAGNOSIS — F419 Anxiety disorder, unspecified: Secondary | ICD-10-CM | POA: Diagnosis present

## 2023-05-11 DIAGNOSIS — Z86718 Personal history of other venous thrombosis and embolism: Secondary | ICD-10-CM

## 2023-05-11 DIAGNOSIS — G4730 Sleep apnea, unspecified: Secondary | ICD-10-CM | POA: Diagnosis present

## 2023-05-11 DIAGNOSIS — G894 Chronic pain syndrome: Secondary | ICD-10-CM | POA: Diagnosis not present

## 2023-05-11 DIAGNOSIS — F1120 Opioid dependence, uncomplicated: Secondary | ICD-10-CM | POA: Diagnosis present

## 2023-05-11 DIAGNOSIS — C182 Malignant neoplasm of ascending colon: Secondary | ICD-10-CM | POA: Diagnosis not present

## 2023-05-11 DIAGNOSIS — J984 Other disorders of lung: Secondary | ICD-10-CM | POA: Diagnosis not present

## 2023-05-11 DIAGNOSIS — Z720 Tobacco use: Secondary | ICD-10-CM | POA: Diagnosis present

## 2023-05-11 DIAGNOSIS — K219 Gastro-esophageal reflux disease without esophagitis: Secondary | ICD-10-CM | POA: Diagnosis present

## 2023-05-11 DIAGNOSIS — I82451 Acute embolism and thrombosis of right peroneal vein: Secondary | ICD-10-CM | POA: Diagnosis present

## 2023-05-11 DIAGNOSIS — Z800 Family history of malignant neoplasm of digestive organs: Secondary | ICD-10-CM

## 2023-05-11 DIAGNOSIS — Z79899 Other long term (current) drug therapy: Secondary | ICD-10-CM

## 2023-05-11 DIAGNOSIS — Z83438 Family history of other disorder of lipoprotein metabolism and other lipidemia: Secondary | ICD-10-CM

## 2023-05-11 DIAGNOSIS — F1010 Alcohol abuse, uncomplicated: Secondary | ICD-10-CM | POA: Diagnosis present

## 2023-05-11 DIAGNOSIS — R911 Solitary pulmonary nodule: Secondary | ICD-10-CM | POA: Diagnosis present

## 2023-05-11 DIAGNOSIS — Z8249 Family history of ischemic heart disease and other diseases of the circulatory system: Secondary | ICD-10-CM

## 2023-05-11 DIAGNOSIS — J449 Chronic obstructive pulmonary disease, unspecified: Secondary | ICD-10-CM | POA: Diagnosis not present

## 2023-05-11 DIAGNOSIS — E876 Hypokalemia: Secondary | ICD-10-CM | POA: Diagnosis present

## 2023-05-11 DIAGNOSIS — F17210 Nicotine dependence, cigarettes, uncomplicated: Secondary | ICD-10-CM | POA: Diagnosis present

## 2023-05-11 DIAGNOSIS — I2699 Other pulmonary embolism without acute cor pulmonale: Secondary | ICD-10-CM | POA: Diagnosis not present

## 2023-05-11 DIAGNOSIS — Z9049 Acquired absence of other specified parts of digestive tract: Secondary | ICD-10-CM

## 2023-05-11 DIAGNOSIS — F32A Depression, unspecified: Secondary | ICD-10-CM | POA: Diagnosis present

## 2023-05-11 DIAGNOSIS — Z90710 Acquired absence of both cervix and uterus: Secondary | ICD-10-CM

## 2023-05-11 DIAGNOSIS — I2694 Multiple subsegmental pulmonary emboli without acute cor pulmonale: Principal | ICD-10-CM | POA: Diagnosis present

## 2023-05-11 DIAGNOSIS — R079 Chest pain, unspecified: Secondary | ICD-10-CM | POA: Diagnosis not present

## 2023-05-11 DIAGNOSIS — R918 Other nonspecific abnormal finding of lung field: Secondary | ICD-10-CM | POA: Diagnosis not present

## 2023-05-11 DIAGNOSIS — I10 Essential (primary) hypertension: Secondary | ICD-10-CM | POA: Diagnosis present

## 2023-05-11 DIAGNOSIS — D72829 Elevated white blood cell count, unspecified: Secondary | ICD-10-CM | POA: Diagnosis present

## 2023-05-11 DIAGNOSIS — E8721 Acute metabolic acidosis: Secondary | ICD-10-CM | POA: Diagnosis present

## 2023-05-11 DIAGNOSIS — Z86711 Personal history of pulmonary embolism: Secondary | ICD-10-CM

## 2023-05-11 DIAGNOSIS — F39 Unspecified mood [affective] disorder: Secondary | ICD-10-CM | POA: Diagnosis present

## 2023-05-11 DIAGNOSIS — J90 Pleural effusion, not elsewhere classified: Secondary | ICD-10-CM | POA: Diagnosis not present

## 2023-05-11 LAB — CBC
HCT: 38.1 % (ref 36.0–46.0)
Hemoglobin: 12.9 g/dL (ref 12.0–15.0)
MCH: 31.9 pg (ref 26.0–34.0)
MCHC: 33.9 g/dL (ref 30.0–36.0)
MCV: 94.1 fL (ref 80.0–100.0)
Platelets: 243 10*3/uL (ref 150–400)
RBC: 4.05 MIL/uL (ref 3.87–5.11)
RDW: 13.8 % (ref 11.5–15.5)
WBC: 13.1 10*3/uL — ABNORMAL HIGH (ref 4.0–10.5)
nRBC: 0 % (ref 0.0–0.2)

## 2023-05-11 LAB — I-STAT CHEM 8, ED
BUN: 5 mg/dL — ABNORMAL LOW (ref 6–20)
Calcium, Ion: 1.1 mmol/L — ABNORMAL LOW (ref 1.15–1.40)
Chloride: 109 mmol/L (ref 98–111)
Creatinine, Ser: 0.6 mg/dL (ref 0.44–1.00)
Glucose, Bld: 83 mg/dL (ref 70–99)
HCT: 42 % (ref 36.0–46.0)
Hemoglobin: 14.3 g/dL (ref 12.0–15.0)
Potassium: 3.2 mmol/L — ABNORMAL LOW (ref 3.5–5.1)
Sodium: 139 mmol/L (ref 135–145)
TCO2: 21 mmol/L — ABNORMAL LOW (ref 22–32)

## 2023-05-11 LAB — BASIC METABOLIC PANEL WITH GFR
Anion gap: 9 (ref 5–15)
BUN: 8 mg/dL (ref 6–20)
CO2: 21 mmol/L — ABNORMAL LOW (ref 22–32)
Calcium: 8.8 mg/dL — ABNORMAL LOW (ref 8.9–10.3)
Chloride: 108 mmol/L (ref 98–111)
Creatinine, Ser: 0.79 mg/dL (ref 0.44–1.00)
GFR, Estimated: >60 mL/min (ref >60)
Glucose, Bld: 97 mg/dL (ref 70–99)
Potassium: 3.1 mmol/L — ABNORMAL LOW (ref 3.5–5.1)
Sodium: 138 mmol/L (ref 135–145)

## 2023-05-11 LAB — TROPONIN I (HIGH SENSITIVITY)
Troponin I (High Sensitivity): 8 ng/L (ref <18)
Troponin I (High Sensitivity): 8 ng/L (ref <18)

## 2023-05-11 MED ORDER — OXYCODONE-ACETAMINOPHEN 5-325 MG PO TABS
0.5000 | ORAL_TABLET | Freq: Four times a day (QID) | ORAL | Status: DC | PRN
  Administered 2023-05-11 – 2023-05-13 (×4): 1 via ORAL
  Filled 2023-05-11 (×4): qty 1

## 2023-05-11 MED ORDER — ONDANSETRON HCL 4 MG/2ML IJ SOLN: 4.0000 mg | Freq: Four times a day (QID) | INTRAMUSCULAR | Status: DC | PRN

## 2023-05-11 MED ORDER — QUETIAPINE FUMARATE 100 MG PO TABS
200.0000 mg | ORAL_TABLET | Freq: Every day | ORAL | Status: DC
  Administered 2023-05-12 (×2): 200 mg via ORAL
  Filled 2023-05-11 (×2): qty 2

## 2023-05-11 MED ORDER — HEPARIN (PORCINE) 25000 UT/250ML-% IV SOLN
1050.0000 [IU]/h | INTRAVENOUS | Status: AC
  Administered 2023-05-11: 850 [IU]/h via INTRAVENOUS
  Administered 2023-05-12: 1050 [IU]/h via INTRAVENOUS
  Filled 2023-05-11 (×2): qty 250

## 2023-05-11 MED ORDER — HEPARIN BOLUS VIA INFUSION
3000.0000 [IU] | Freq: Once | INTRAVENOUS | Status: AC
  Administered 2023-05-11: 3000 [IU] via INTRAVENOUS
  Filled 2023-05-11: qty 3000

## 2023-05-11 MED ORDER — MEGESTROL ACETATE 625 MG/5ML PO SUSP
625.0000 mg | Freq: Every day | ORAL | Status: DC
  Filled 2023-05-11: qty 5

## 2023-05-11 MED ORDER — OXYCODONE HCL 5 MG PO TABS
2.5000 mg | ORAL_TABLET | Freq: Four times a day (QID) | ORAL | Status: DC | PRN
  Administered 2023-05-11 – 2023-05-13 (×4): 5 mg via ORAL
  Filled 2023-05-11 (×4): qty 1

## 2023-05-11 MED ORDER — GABAPENTIN 300 MG PO CAPS
600.0000 mg | ORAL_CAPSULE | Freq: Three times a day (TID) | ORAL | Status: DC | PRN
  Administered 2023-05-11: 600 mg via ORAL
  Filled 2023-05-11 (×2): qty 2

## 2023-05-11 MED ORDER — ACETAMINOPHEN 650 MG RE SUPP: 650.0000 mg | Freq: Four times a day (QID) | RECTAL | Status: DC | PRN

## 2023-05-11 MED ORDER — MORPHINE SULFATE (PF) 4 MG/ML IV SOLN
4.0000 mg | Freq: Once | INTRAVENOUS | Status: AC
  Administered 2023-05-11: 4 mg via INTRAVENOUS
  Filled 2023-05-11: qty 1

## 2023-05-11 MED ORDER — TIZANIDINE HCL 2 MG PO TABS
4.0000 mg | ORAL_TABLET | Freq: Three times a day (TID) | ORAL | Status: DC | PRN
  Filled 2023-05-11 (×2): qty 2

## 2023-05-11 MED ORDER — ONDANSETRON HCL 4 MG/2ML IJ SOLN
4.0000 mg | Freq: Once | INTRAMUSCULAR | Status: AC
  Administered 2023-05-11: 4 mg via INTRAVENOUS
  Filled 2023-05-11: qty 2

## 2023-05-11 MED ORDER — MIRTAZAPINE 15 MG PO TABS
30.0000 mg | ORAL_TABLET | Freq: Every day | ORAL | Status: DC
  Administered 2023-05-12 (×2): 30 mg via ORAL
  Filled 2023-05-11 (×2): qty 2

## 2023-05-11 MED ORDER — IOHEXOL 350 MG/ML SOLN
65.0000 mL | Freq: Once | INTRAVENOUS | Status: AC | PRN
  Administered 2023-05-11: 65 mL via INTRAVENOUS

## 2023-05-11 MED ORDER — FERROUS SULFATE 325 (65 FE) MG PO TABS
325.0000 mg | ORAL_TABLET | Freq: Every day | ORAL | Status: DC
  Administered 2023-05-12 – 2023-05-13 (×2): 325 mg via ORAL
  Filled 2023-05-11 (×2): qty 1

## 2023-05-11 MED ORDER — ACETAMINOPHEN 325 MG PO TABS: 650.0000 mg | ORAL_TABLET | Freq: Four times a day (QID) | ORAL | Status: DC | PRN

## 2023-05-11 MED ORDER — OXYCODONE-ACETAMINOPHEN 10-325 MG PO TABS: 0.5000 | ORAL_TABLET | Freq: Four times a day (QID) | ORAL | Status: DC | PRN

## 2023-05-11 MED ORDER — MEGESTROL ACETATE 400 MG/10ML PO SUSP
400.0000 mg | Freq: Every day | ORAL | Status: DC
  Administered 2023-05-12 (×2): 400 mg via ORAL
  Filled 2023-05-11 (×2): qty 10

## 2023-05-11 MED ORDER — ONDANSETRON HCL 4 MG PO TABS: 4.0000 mg | ORAL_TABLET | Freq: Four times a day (QID) | ORAL | Status: DC | PRN

## 2023-05-11 MED ORDER — POTASSIUM CHLORIDE CRYS ER 20 MEQ PO TBCR
40.0000 meq | EXTENDED_RELEASE_TABLET | ORAL | Status: AC
  Administered 2023-05-12 (×2): 40 meq via ORAL
  Filled 2023-05-11 (×2): qty 2

## 2023-05-11 MED ORDER — PANTOPRAZOLE SODIUM 40 MG PO TBEC
40.0000 mg | DELAYED_RELEASE_TABLET | Freq: Every day | ORAL | Status: DC
  Administered 2023-05-12 – 2023-05-13 (×3): 40 mg via ORAL
  Filled 2023-05-11 (×3): qty 1

## 2023-05-11 NOTE — H&P (Addendum)
History and Physical    Patient: Emily Cameron DOB: 12-12-1960 DOA: 05/11/2023 DOS: the patient was seen and examined on 05/11/2023 PCP: Courtney Paris, NP  Patient coming from: Home  Chief Complaint:  Chief Complaint  Patient presents with   Chest Pain   HPI: Emily Cameron is a 62 y.o. female with PMH of PE/DVT in 2012 and 2026 not on [facility], stage IIa colon cancer s/p robotic colectomy in 10/2022, OSA not on CPAP, COPD, chronic pain syndrome, anxiety and depression presenting with right-sided chest pain.  Patient noted some "knots" on the back of her right calf and thigh about 2 weeks ago. Those knots gone since then.  However, she started experiencing chest pain over the last 4 to 5 days.  Chest pain was initially bilateral in anterior chest but moved to right chest over the last 2 days.  Pain is worse with breathing and movement.  She described the pain as sharp.  Denies fever, chills, runny nose and sore throat.  Reports some shortness of breath due to chest pain.  She denies GI or UTI symptoms.  Denies focal neurosymptoms.  She said she was taken off anticoagulation due to alcohol abuse in the past.  She continues to drink beer but not more than 3 cans in about a week.  Reports smoking about half a pack a day.  She denies recreational drug use.  She denies recent long car ride or flight.  In ED, tachycardic to 110s.  Respiratory rate in 20s.  Other vital stable.  Saturating in 90s on room air.  K3.1.  WBC 13.1.  No differential ordered.  Serial troponin negative x 2.  CT angio chest showed acute pulmonary embolism and RML and RLL pulmonary arteries without cor pulmonale but with possible lung infarct, small right pleural effusion with some loculation and 8 x 5 mm RUL nodule.  Patient was started on IV heparin.  Hospital service called for admission.  Review of Systems: As mentioned in the history of present illness. All other systems reviewed and are negative. Past Medical  History:  Diagnosis Date   Alcohol abuse    Anemia    Anxiety    Arthritis    Cancer of hepatic flexure (HCC)    COPD (chronic obstructive pulmonary disease) (HCC)    Depression    DVT (deep venous thrombosis) (HCC) 12/24/2013   GERD (gastroesophageal reflux disease)    History of kidney stones    Hypertension    Insomnia    Other pulmonary embolism without acute cor pulmonale (HCC) 03/05/2011   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 Marijuna not taking since 7 month.    Sleep apnea    Weight loss    due to anxiety   Past Surgical History:  Procedure Laterality Date   ABDOMINAL HYSTERECTOMY     BIOPSY  09/27/2022   Procedure: BIOPSY;  Surgeon: Willis Modena, MD;  Location: [facility];  Service: Gastroenterology;;   COLONOSCOPY WITH PROPOFOL Bilateral 09/27/2022   Procedure: COLONOSCOPY WITH PROPOFOL;  Surgeon: Willis Modena, MD;  Location: [facility];  Service: Gastroenterology;  Laterality: Bilateral;   CYST EXCISION     HEMOSTASIS CLIP PLACEMENT  09/27/2022   Procedure: HEMOSTASIS CLIP PLACEMENT;  Surgeon: Willis Modena, MD;  Location: [facility];  Service: Gastroenterology;;   LITHOTRIPSY     MICROLARYNGOSCOPY Left 06/20/2014   Procedure: MICROLARYNGOSCOPY WITH REMOVAL OF LEFT VOCAL CORD MASS;  Surgeon: Suzanna Obey, MD;  Location: [facility] OR;  Service: ENT;  Laterality: Left;   POLYPECTOMY  09/27/2022   Procedure: POLYPECTOMY;  Surgeon: Willis Modena, MD;  Location: [facility];  Service: Gastroenterology;;   SUBMUCOSAL TATTOO INJECTION  09/27/2022   Procedure: SUBMUCOSAL TATTOO INJECTION;  Surgeon: Willis Modena, MD;  Location: [facility];  Service: Gastroenterology;;   Social History:  reports that she has been smoking cigarettes. She has a 15.5 pack-year smoking history. She has  never used smokeless tobacco. She reports current alcohol use. She reports that she does not currently use drugs after having used the following drugs: Marijuana and "Crack" cocaine. Frequency: 7.00 times per week.  No Known Allergies  Family History  Problem Relation Age of Onset   Heart disease Mother    Hyperlipidemia Mother    Hypertension Mother    Coronary artery disease Sister    Aneurysm Brother 28   Pulmonary embolism Brother    Aneurysm Paternal Aunt 71       Died    Heart disease Maternal Grandmother    Cancer Paternal Grandmother        colon cancer    Prior to Admission medications   Medication Sig Start Date End Date Taking? Authorizing Provider  ferrous sulfate (FEROSUL) 325 (65 FE) MG tablet 1 tablet Orally Three times a Week for 90 days    [provider]  gabapentin (NEURONTIN) 600 MG tablet Take 1 tablet (600 mg total) by mouth 3 (three) times daily as needed (pain). 03/25/23   Carlean Jews, NP  Ibuprofen-diphenhydrAMINE HCl 200-25 MG CAPS Take 1 tablet by mouth at bedtime as needed (sleep).    [provider]  megestrol (MEGACE ES) 625 MG/5ML suspension Take 5 mLs (625 mg total) by mouth daily. 03/25/23   Carlean Jews, NP  mirtazapine (REMERON) 30 MG tablet Take 1 tablet (30 mg total) by mouth at bedtime. 03/25/23   Carlean Jews, NP  omeprazole (PRILOSEC) 40 MG capsule Take 1 capsule (40 mg total) by mouth 2 (two) times daily as needed (acid reflux). 03/25/23   Carlean Jews, NP  oxyCODONE-acetaminophen (PERCOCET) 10-325 MG tablet Take 0.5-1 tablets by mouth every 6 (six) hours as needed for pain. 10/24/22   Karie Soda, MD  potassium chloride (KLOR-CON) 10 MEQ tablet Take 1 tablet (10 mEq total) by mouth daily. 03/25/23   Carlean Jews, NP  QUEtiapine (SEROQUEL) 200 MG tablet Take 1 tablet (200 mg total) by mouth at bedtime. 03/25/23   Carlean Jews, NP  Tetrahydrozoline HCl (VISINE OP) Place 2 drops into both eyes as needed (for dry  eyes). Reported on 09/09/2015    [provider]  tiZANidine (ZANAFLEX) 4 MG capsule Take 4 mg by mouth 3 (three) times daily as needed for muscle spasms.    [provider]    Physical Exam: Vitals:   05/11/23 1645 05/11/23 1700 05/11/23 1715 05/11/23 1805  BP: (!) 141/96 136/84 137/80 (!) 134/94  Pulse: (!) 106 (!) 113 (!) 109 (!) 111  Resp: (!) 28 (!) 25 (!) 24 (!) 22  Temp:      TempSrc:      SpO2: 94% 95% 92% 92%  Weight:      Height:       GENERAL: No apparent distress.  Nontoxic. HEENT: MMM.  Vision  and hearing grossly intact.  NECK: Supple.  No apparent JVD.  RESP:  No IWOB.  Fair aeration bilaterally. CVS:  RRR. Heart sounds normal.  ABD/GI/GU: BS+. Abd soft, NTND.  MSK/EXT:   No apparent deformity. Moves extremities. No edema.  No calf tenderness. SKIN: no apparent skin lesion or wound NEURO: Awake and alert. Oriented appropriately.  No apparent focal neuro deficit. PSYCH: Calm. Normal affect.  Data Reviewed: See HPI  Assessment and Plan: Principal Problem:   Acute pulmonary embolism without acute cor pulmonale (HCC) Active Problems:   Tobacco abuse   Sleep apnea   COPD (chronic obstructive pulmonary disease) (HCC)   Chronic pain syndrome   Cancer of right colon (HCC)   Hypokalemia  Acute PE without cor pulmonale: Patient with prior history of DVT/PE in 2012 and 2016.  She is no longer on anticoagulation.  She says she was taken off anticoagulation due to alcohol abuse.  She had right lower extremity swelling about 2 weeks prior.  She is also on Megace which could increase her risk of VTE. -Continue IV heparin -Check echocardiogram and lower extremity venous Doppler -Encouraged smoking cessation  Chronic COPD?  Does not seem to be on inhalers. -Encouraged smoking cessation  Tobacco use disorder: Reports smoking about half a pack a day. -Encouraged smoking cessation -Nicotine patch  Sleep apnea not on CPAP or  oxygen.  Hypokalemia -Monitor replenish as appropriate  Chronic pain with opioid dependence-failed Percocet 10/325 #90 for 30 days this month. -Resume home meds after med rec  Mood disorder: Stable -Resume home meds.  Pulmonary nodule: CT angio showed 8 x 5 mm unchanged irregular nodule in right apex -Outpatient follow-up.   Advance Care Planning:   Code Status: Full Code discussed with patient.  Consults: None  Family Communication: None at bedside  Severity of Illness: The appropriate patient status for this patient is OBSERVATION. Observation status is judged to be reasonable and necessary in order to provide the required intensity of service to ensure the patient's safety. The patient's presenting symptoms, physical exam findings, and initial radiographic and laboratory data in the context of their medical condition is felt to place them at decreased risk for further clinical deterioration. Furthermore, it is anticipated that the patient will be medically stable for discharge from the hospital within 2 midnights of admission.   Author: Almon Hercules, MD 05/11/2023 6:42 PM  For on call review [email or web address].

## 2023-05-11 NOTE — ED Triage Notes (Signed)
Per [facility] pt coming from home c/o chest pain x 3 days. C/o pain in both right calf that has moved up. Patient points to pain to right upper abdominal quadrant. Hx of dvt but no blood thinners for past 5 years.

## 2023-05-11 NOTE — ED Provider Notes (Signed)
[place] [facility] AT [facility] Provider Note   CSN: [id number] Arrival date & time: 05/11/23  1104     History  Chief Complaint  Patient presents with   Chest Pain    Emily Cameron is a 62 y.o. female.  Patient is a 62 year old female with a history of anemia, rectal cancer status post excision that did not require chemotherapy, DVT and PE in 2016 and 2012 but has not been on any anticoagulation for years who is presenting today with complaint of chest pain and shortness of breath.  Patient reports that a few weeks ago she started noticing a pain and not in her right leg.  She also noticed swelling in her leg that was gradually moving up the leg and getting worse.  She then noted that about 4 days ago the pain in her legs seem to improve but then she started feeling pain in her chest mostly on the right side.  She reports it feels like there is someone stabbing her and is painful when she takes a deep breath or coughs.  She has felt short of breath as well.  No passing out.  She does continue to use cigarettes.  She has not had any significant abdominal pain, vomiting or change in her stools.  The history is provided by the patient and medical records.  Chest Pain      Home Medications Prior to Admission medications   Medication Sig Start Date End Date Taking? Authorizing Provider  ferrous sulfate (FEROSUL) 325 (65 FE) MG tablet 1 tablet Orally Three times a Week for 90 days    [provider]  gabapentin (NEURONTIN) 600 MG tablet Take 1 tablet (600 mg total) by mouth 3 (three) times daily as needed (pain). 03/25/23   Carlean Jews, NP  Ibuprofen-diphenhydrAMINE HCl 200-25 MG CAPS Take 1 tablet by mouth at bedtime as needed (sleep).    [provider]  megestrol (MEGACE ES) 625 MG/5ML suspension Take 5 mLs (625 mg total) by mouth daily. 03/25/23   Carlean Jews, NP  mirtazapine (REMERON) 30 MG tablet Take 1 tablet (30 mg total) by mouth  at bedtime. 03/25/23   Carlean Jews, NP  omeprazole (PRILOSEC) 40 MG capsule Take 1 capsule (40 mg total) by mouth 2 (two) times daily as needed (acid reflux). 03/25/23   Carlean Jews, NP  oxyCODONE-acetaminophen (PERCOCET) 10-325 MG tablet Take 0.5-1 tablets by mouth every 6 (six) hours as needed for pain. 10/24/22   Karie Soda, MD  potassium chloride (KLOR-CON) 10 MEQ tablet Take 1 tablet (10 mEq total) by mouth daily. 03/25/23   Carlean Jews, NP  QUEtiapine (SEROQUEL) 200 MG tablet Take 1 tablet (200 mg total) by mouth at bedtime. 03/25/23   Carlean Jews, NP  Tetrahydrozoline HCl (VISINE OP) Place 2 drops into both eyes as needed (for dry eyes). Reported on 09/09/2015    [provider]  tiZANidine (ZANAFLEX) 4 MG capsule Take 4 mg by mouth 3 (three) times daily as needed for muscle spasms.    [provider]      Allergies    Patient has no known allergies.    Review of Systems   Review of Systems  Cardiovascular:  Positive for chest pain.    Physical Exam Updated Vital Signs BP (!) 155/87   Pulse (!) 102   Temp 98.5 F (36.9 C) (Oral)   Resp (!) 28   Ht 5\' 4"  (1.626 m)  Wt 47.6 kg   LMP 01/26/2004   SpO2 93%   BMI 18.02 kg/m  Physical Exam Vitals and nursing note reviewed.  Constitutional:      General: She is not in acute distress.    Appearance: She is well-developed.  HENT:     Head: Normocephalic and atraumatic.  Eyes:     Pupils: Pupils are equal, round, and reactive to light.  Cardiovascular:     Rate and Rhythm: Regular rhythm. Tachycardia present.     Heart sounds: Normal heart sounds. No murmur heard.    No friction rub.  Pulmonary:     Effort: Pulmonary effort is normal.     Breath sounds: Normal breath sounds. No wheezing or rales.  Chest:     Chest wall: Tenderness present.  Abdominal:     General: Bowel sounds are normal. There is no distension.     Palpations: Abdomen is soft.     Tenderness: There is no abdominal  tenderness. There is no guarding or rebound.  Musculoskeletal:        General: No tenderness. Normal range of motion.     Right lower leg: No edema.     Left lower leg: No edema.     Comments: No edema  Skin:    General: Skin is warm and dry.     Findings: No rash.  Neurological:     Mental Status: She is alert and oriented to person, place, and time.     Cranial Nerves: No cranial nerve deficit.  Psychiatric:        Behavior: Behavior normal.     ED Results / Procedures / Treatments   Labs (all labs ordered are listed, but only abnormal results are displayed) Labs Reviewed  BASIC METABOLIC PANEL - Abnormal; Notable for the following components:      Result Value   Potassium 3.1 (*)    CO2 21 (*)    Calcium 8.8 (*)    All other components within normal limits  CBC - Abnormal; Notable for the following components:   WBC 13.1 (*)    All other components within normal limits  I-STAT CHEM 8, ED - Abnormal; Notable for the following components:   Potassium 3.2 (*)    BUN 5 (*)    Calcium, Ion 1.10 (*)    TCO2 21 (*)    All other components within normal limits  TROPONIN I (HIGH SENSITIVITY)  TROPONIN I (HIGH SENSITIVITY)    EKG EKG Interpretation Date/Time:  Wednesday May 11 2023 10:48:39 EDT Ventricular Rate:  107 PR Interval:  160 QRS Duration:  88 QT Interval:  354 QTC Calculation: 472 R Axis:   -54  Text Interpretation: Sinus tachycardia with Premature atrial complexes with Abberant conduction Left anterior fascicular block Moderate voltage criteria for LVH, may be normal variant ( R in aVL , Cornell product ) Septal infarct , age undetermined No significant change since last tracing When compared with ECG of 25-Mar-2023 14:58, PREVIOUS ECG IS PRESENT Confirmed by Gwyneth Sprout ([id number]) on 05/11/2023 4:12:32 PM  Radiology CT Angio Chest PE W/Cm &/Or Wo Cm  Result Date: 05/11/2023 CLINICAL DATA:  History of colon cancer with three day history of chest pain  EXAM: CT ANGIOGRAPHY CHEST WITH CONTRAST TECHNIQUE: Multidetector CT imaging of the chest was performed using the standard protocol during bolus administration of intravenous contrast. Multiplanar CT image reconstructions and MIPs were obtained to evaluate the vascular anatomy. RADIATION DOSE REDUCTION: This exam was performed according to  the departmental dose-optimization program which includes automated exposure control, adjustment of the mA and/or kV according to patient size and/or use of iterative reconstruction technique. CONTRAST:  65mL OMNIPAQUE IOHEXOL 350 MG/ML SOLN COMPARISON:  Chest radiograph dated 05/11/2023, CT chest dated 10/06/2022 FINDINGS: Cardiovascular: The study is high quality for the evaluation of pulmonary embolism. Filling defect within the right middle lobar arteries. Additional filling defects within right lower lobe subsegmental arteries (6:180, 197). Great vessels are normal in course and caliber. Normal heart size. No significant pericardial fluid/thickening. RV to LV ratio is less than 1. Mediastinum/Nodes: Imaged thyroid gland without nodules meeting criteria for imaging follow-up by size. Normal esophagus. No pathologically enlarged axillary, supraclavicular, mediastinal, or hilar lymph nodes. Lungs/Pleura: The central airways are patent. Linear secretions within the right bronchus intermedius. Unchanged irregular nodule in the right apex measuring 8 x 5 mm (11:16). Right apical scarring. Triangular ground-glass density along the medial right middle lobe. Bilateral lower lobe subsegmental and relaxation atelectasis. No pneumothorax. Small right pleural effusion, including a loculated component. Upper abdomen: Normal. Musculoskeletal: No acute or abnormal lytic or blastic osseous lesions. Review of the MIP images confirms the above findings. IMPRESSION: 1. Pulmonary emboli within the right middle lobar and lower lobe subsegmental arteries. No evidence of right heart strain. 2.  Triangular ground-glass density along the medial right middle lobe, which may represent pulmonary infarct. 3. Small right pleural effusion, including a loculated component. 4. Unchanged irregular nodule in the right apex measuring 8 x 5 mm. Critical Value/emergent results were called by telephone at the time of interpretation on 05/11/2023 at 4:47 pm to provider Dr. Anitra Lauth, Who verbally acknowledged these results. Electronically Signed   By: Agustin Cree M.D.   On: 05/11/2023 16:47   DG Chest 2 View  Result Date: 05/11/2023 CLINICAL DATA:  Chest pain for 3 days. EXAM: CHEST - 2 VIEW COMPARISON:  February 14, 2019. FINDINGS: The heart size and mediastinal contours are within normal limits. Hypoinflation of the lungs is noted with mild bibasilar subsegmental atelectasis or infiltrates. The visualized skeletal structures are unremarkable. IMPRESSION: Hypoinflation of the lungs with mild bibasilar subsegmental atelectasis or infiltrates. Electronically Signed   By: Lupita Raider M.D.   On: 05/11/2023 12:29    Procedures Procedures    Medications Ordered in ED Medications  iohexol (OMNIPAQUE) 350 MG/ML injection 65 mL (65 mLs Intravenous Contrast Given 05/11/23 1601)  morphine (PF) 4 MG/ML injection 4 mg (4 mg Intravenous Given 05/11/23 1654)  ondansetron (ZOFRAN) injection 4 mg (4 mg Intravenous Given 05/11/23 1654)    ED Course/ Medical Decision Making/ A&P                                 Medical Decision Making Amount and/or Complexity of Data Reviewed Labs: ordered. Decision-making details documented in ED Course. Radiology: ordered and independent interpretation performed. Decision-making details documented in ED Course. ECG/medicine tests: ordered and independent interpretation performed. Decision-making details documented in ED Course.  Risk Prescription drug management. Decision regarding hospitalization.   Pt with multiple medical problems and comorbidities and presenting today with a  complaint that caries a high risk for morbidity and mortality.  Here today with complaints of chest pain, shortness of breath that started after she had leg pain and swelling 2 weeks ago.  Concern for PE as she does have a history of PE and DVT and is not anticoagulated this time versus atypical ACS versus  electrolyte abnormalities such as hypokalemia versus atypical pneumonia.  Patient denies any infectious symptoms at this time.  She does have a history of colon cancer but reports it is gone after surgical removal and did not require chemotherapy.  She has no belly pain on exam but does have significant chest tenderness and has decreased inspiration due to pain.  Patient was given pain control.  I independently interpreted patient's labs and EKG.  EKG with sinus tachycardia today with occasional PACs but no other ST changes concerning for ACS.  Chem-8 showed mild hypokalemia of 3.2 but BMP with normal creatinine and sodium levels, troponin was normal and CBC with leukocytosis of 13. I have independently visualized and interpreted pt's images today.  Chest x-ray without acute findings.  However given patient's report and history feel that she needs a CTA to rule out PE. CTA is returned which does show pulmonary emboli within the right middle lobar and lower lobar lungs without evidence of heart strain as well as medial right middle lobe pulmonary infarct and a small pleural effusion.  Feel this is the explanation of the patient's symptoms.  Patient was started on heparin.  Will admit for further care.  Discussed the findings with the patient and her family.  They are comfortable with this plan.  At this time patient is hemodynamically stable but is tachypneic and tachycardic most likely related to pain.  O2 sat is 93% on room air at rest. CRITICAL CARE Performed by: Daysi Boggan Total critical care time: 30 minutes Critical care time was exclusive of separately billable procedures and treating other  patients. Critical care was necessary to treat or prevent imminent or life-threatening deterioration. Critical care was time spent personally by me on the following activities: development of treatment plan with patient and/or surrogate as well as nursing, discussions with consultants, evaluation of patient's response to treatment, examination of patient, obtaining history from patient or surrogate, ordering and performing treatments and interventions, ordering and review of laboratory studies, ordering and review of radiographic studies, pulse oximetry and re-evaluation of patient's condition.          Final Clinical Impression(s) / ED Diagnoses Final diagnoses:  PE (pulmonary thromboembolism) (HCC)    Rx / DC Orders ED Discharge Orders     None         Gwyneth Sprout, MD 05/11/23 1707

## 2023-05-11 NOTE — ED Notes (Signed)
ED TO INPATIENT HANDOFF REPORT  ED Nurse Name and Phone #: Murlean Iba Paramedic [phone]  S Name/Age/Gender Emily Cameron 62 y.o. female Room/Bed: 003C/003C  Code Status   Code Status: Full Code  Home/SNF/Other Home Patient oriented to: self, place, time, and situation Is this baseline? Yes   Triage Complete: Triage complete  Chief Complaint Acute pulmonary embolism without acute cor pulmonale (HCC) [I26.99]  Triage Note Per [facility] pt coming from home c/o chest pain x 3 days. C/o pain in both right calf that has moved up. Patient points to pain to right upper abdominal quadrant. Hx of dvt but no blood thinners for past 5 years.    Allergies No Known Allergies  Level of Care/Admitting Diagnosis ED Disposition     ED Disposition  Admit   Condition  --   Comment  Hospital Area: [facility] [[id number]]  Level of Care: Telemetry Medical [104]  May place patient in observation at [facility] or [facility] if equivalent level of care is available:: No  Covid Evaluation: Asymptomatic - no recent exposure (last 10 days) testing not required  Diagnosis: Acute pulmonary embolism without acute cor pulmonale [facility]) [[id number]]  Admitting Physician: Almon Hercules [[id number]]  Attending Physician: Almon Hercules [[id number]]          B Medical/Surgery History Past Medical History:  Diagnosis Date   Alcohol abuse    Anemia    Anxiety    Arthritis    Cancer of hepatic flexure (HCC)    COPD (chronic obstructive pulmonary disease) (HCC)    Depression    DVT (deep venous thrombosis) (HCC) 12/24/2013   GERD (gastroesophageal reflux disease)    History of kidney stones    Hypertension    Insomnia    Other pulmonary embolism without acute cor pulmonale (HCC) 03/05/2011   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 History of Cocain abuse  - not taking since 15 years. Marijuna not taking since 7 month.    Sleep apnea    Weight loss    due to anxiety   Past Surgical History:  Procedure Laterality Date   ABDOMINAL HYSTERECTOMY     BIOPSY  09/27/2022   Procedure: BIOPSY;  Surgeon: Willis Modena, MD;  Location: [facility];  Service: Gastroenterology;;   COLONOSCOPY WITH PROPOFOL Bilateral 09/27/2022   Procedure: COLONOSCOPY WITH PROPOFOL;  Surgeon: Willis Modena, MD;  Location: [facility];  Service: Gastroenterology;  Laterality: Bilateral;   CYST EXCISION     HEMOSTASIS CLIP PLACEMENT  09/27/2022   Procedure: HEMOSTASIS CLIP PLACEMENT;  Surgeon: Willis Modena, MD;  Location: [facility];  Service: Gastroenterology;;   LITHOTRIPSY     MICROLARYNGOSCOPY Left 06/20/2014   Procedure: MICROLARYNGOSCOPY WITH REMOVAL OF LEFT VOCAL CORD MASS;  Surgeon: Suzanna Obey, MD;  Location: [facility] OR;  Service: ENT;  Laterality: Left;   POLYPECTOMY  09/27/2022   Procedure: POLYPECTOMY;  Surgeon: Willis Modena, MD;  Location: [facility];  Service: Gastroenterology;;   SUBMUCOSAL TATTOO INJECTION  09/27/2022   Procedure: SUBMUCOSAL TATTOO INJECTION;  Surgeon: Willis Modena, MD;  Location: [facility];  Service: Gastroenterology;;     A IV Location/Drains/Wounds Patient Lines/Drains/Airways Status     Active Line/Drains/Airways     Name Placement date Placement time Site Days   Peripheral IV 05/11/23 20 G 1" Anterior;Right Forearm 05/11/23  1546  Forearm  less than 1   Peripheral IV 05/11/23 20 G 1" Anterior;Left Forearm 05/11/23  1707  Forearm  less than 1   Incision - 4 Ports Abdomen Upper;Left Left Left;Lower Left;Lateral 10/22/22  0810  -- 201            Intake/Output Last 24 hours No intake or output data in the 24 hours ending 05/11/23 1752  Labs/Imaging Results for orders placed or performed during the hospital encounter of 05/11/23 (from the past 48 hour(s))  Basic metabolic panel     Status: Abnormal   Collection  Time: 05/11/23 11:23 AM  Result Value Ref Range   Sodium 138 135 - 145 mmol/L   Potassium 3.1 (L) 3.5 - 5.1 mmol/L   Chloride 108 98 - 111 mmol/L   CO2 21 (L) 22 - 32 mmol/L   Glucose, Bld 97 70 - 99 mg/dL    Comment: Glucose reference range applies only to samples taken after fasting for at least 8 hours.   BUN 8 6 - 20 mg/dL   Creatinine, Ser 0.98 0.44 - 1.00 mg/dL   Calcium 8.8 (L) 8.9 - 10.3 mg/dL   GFR, Estimated >11 >91 mL/min    Comment: (NOTE) Calculated using the CKD-EPI Creatinine Equation (2021)    Anion gap 9 5 - 15    Comment: Performed at [facility], [address] [place], [place] [address]  CBC     Status: Abnormal   Collection Time: 05/11/23 11:23 AM  Result Value Ref Range   WBC 13.1 (H) 4.0 - 10.5 K/uL   RBC 4.05 3.87 - 5.11 MIL/uL   Hemoglobin 12.9 12.0 - 15.0 g/dL   HCT 56.2 13.0 - 86.5 %   MCV 94.1 80.0 - 100.0 fL   MCH 31.9 26.0 - 34.0 pg   MCHC 33.9 30.0 - 36.0 g/dL   RDW 78.4 69.6 - 29.5 %   Platelets 243 150 - 400 K/uL   nRBC 0.0 0.0 - 0.2 %    Comment: Performed at [facility], [address], [place], [place] [address]  Troponin I (High Sensitivity)     Status: None   Collection Time: 05/11/23 11:23 AM  Result Value Ref Range   Troponin I (High Sensitivity) 8 <18 ng/L    Comment: (NOTE) Elevated high sensitivity troponin I (hsTnI) values and significant  changes across serial measurements may suggest ACS but many other  chronic and acute conditions are known to elevate hsTnI results.  Refer to the "Links" section for chest pain algorithms and additional  guidance. Performed at [facility], [address], [place], [place] [address]   Troponin I (High Sensitivity)     Status: None   Collection Time: 05/11/23  3:47 PM  Result Value Ref Range   Troponin I (High Sensitivity) 8 <18 ng/L    Comment: (NOTE) Elevated high sensitivity troponin I (hsTnI) values and significant  changes across serial measurements may suggest  ACS but many other  chronic and acute conditions are known to elevate hsTnI results.  Refer to the "Links" section for chest pain algorithms and additional  guidance. Performed at [facility], [address] [place], [place] [address]   I-stat chem 8, ed     Status: Abnormal   Collection Time: 05/11/23  3:53 PM  Result Value Ref Range   Sodium 139 135 - 145 mmol/L   Potassium 3.2 (L) 3.5 - 5.1 mmol/L  Chloride 109 98 - 111 mmol/L   BUN 5 (L) 6 - 20 mg/dL   Creatinine, Ser 8.65 0.44 - 1.00 mg/dL   Glucose, Bld 83 70 - 99 mg/dL    Comment: Glucose reference range applies only to samples taken after fasting for at least 8 hours.   Calcium, Ion 1.10 (L) 1.15 - 1.40 mmol/L   TCO2 21 (L) 22 - 32 mmol/L   Hemoglobin 14.3 12.0 - 15.0 g/dL   HCT 78.4 69.6 - 29.5 %   CT Angio Chest PE W/Cm &/Or Wo Cm  Result Date: 05/11/2023 CLINICAL DATA:  History of colon cancer with three day history of chest pain EXAM: CT ANGIOGRAPHY CHEST WITH CONTRAST TECHNIQUE: Multidetector CT imaging of the chest was performed using the standard protocol during bolus administration of intravenous contrast. Multiplanar CT image reconstructions and MIPs were obtained to evaluate the vascular anatomy. RADIATION DOSE REDUCTION: This exam was performed according to the departmental dose-optimization program which includes automated exposure control, adjustment of the mA and/or kV according to patient size and/or use of iterative reconstruction technique. CONTRAST:  65mL OMNIPAQUE IOHEXOL 350 MG/ML SOLN COMPARISON:  Chest radiograph dated 05/11/2023, CT chest dated 10/06/2022 FINDINGS: Cardiovascular: The study is high quality for the evaluation of pulmonary embolism. Filling defect within the right middle lobar arteries. Additional filling defects within right lower lobe subsegmental arteries (6:180, 197). Great vessels are normal in course and caliber. Normal heart size. No significant pericardial fluid/thickening. RV to  LV ratio is less than 1. Mediastinum/Nodes: Imaged thyroid gland without nodules meeting criteria for imaging follow-up by size. Normal esophagus. No pathologically enlarged axillary, supraclavicular, mediastinal, or hilar lymph nodes. Lungs/Pleura: The central airways are patent. Linear secretions within the right bronchus intermedius. Unchanged irregular nodule in the right apex measuring 8 x 5 mm (11:16). Right apical scarring. Triangular ground-glass density along the medial right middle lobe. Bilateral lower lobe subsegmental and relaxation atelectasis. No pneumothorax. Small right pleural effusion, including a loculated component. Upper abdomen: Normal. Musculoskeletal: No acute or abnormal lytic or blastic osseous lesions. Review of the MIP images confirms the above findings. IMPRESSION: 1. Pulmonary emboli within the right middle lobar and lower lobe subsegmental arteries. No evidence of right heart strain. 2. Triangular ground-glass density along the medial right middle lobe, which may represent pulmonary infarct. 3. Small right pleural effusion, including a loculated component. 4. Unchanged irregular nodule in the right apex measuring 8 x 5 mm. Critical Value/emergent results were called by telephone at the time of interpretation on 05/11/2023 at 4:47 pm to provider Dr. Anitra Lauth, Who verbally acknowledged these results. Electronically Signed   By: Agustin Cree M.D.   On: 05/11/2023 16:47   DG Chest 2 View  Result Date: 05/11/2023 CLINICAL DATA:  Chest pain for 3 days. EXAM: CHEST - 2 VIEW COMPARISON:  February 14, 2019. FINDINGS: The heart size and mediastinal contours are within normal limits. Hypoinflation of the lungs is noted with mild bibasilar subsegmental atelectasis or infiltrates. The visualized skeletal structures are unremarkable. IMPRESSION: Hypoinflation of the lungs with mild bibasilar subsegmental atelectasis or infiltrates. Electronically Signed   By: Lupita Raider M.D.   On: 05/11/2023 12:29     Pending Labs Unresulted Labs (From admission, onward)     Start     Ordered   05/12/23 0500  Heparin level (unfractionated)  Daily,   R      05/11/23 1733   05/12/23 0500  Renal function panel  Tomorrow morning,   R  05/11/23 1746   05/12/23 0500  Magnesium  Tomorrow morning,   R        05/11/23 1746   05/12/23 0500  CBC  Tomorrow morning,   R        05/11/23 1746   05/12/23 0100  Heparin level (unfractionated)  Once-Timed,   URGENT        05/11/23 1733   05/11/23 1746  HIV Antibody (routine testing w rflx)  (HIV Antibody (Routine testing w reflex) panel)  Once,   R        05/11/23 1746            Vitals/Pain Today's Vitals   05/11/23 1645 05/11/23 1652 05/11/23 1700 05/11/23 1706  BP: (!) 141/96  136/84   Pulse: (!) 106  (!) 113   Resp: (!) 28  (!) 25   Temp:      TempSrc:      SpO2: 94%  95%   Weight:      Height:      PainSc:  7   4     Isolation Precautions No active isolations  Medications Medications  heparin ADULT infusion 100 units/mL (25000 units/246mL) (850 Units/hr Intravenous New Bag/Given 05/11/23 1742)  acetaminophen (TYLENOL) tablet 650 mg (has no administration in time range)    Or  acetaminophen (TYLENOL) suppository 650 mg (has no administration in time range)  ondansetron (ZOFRAN) tablet 4 mg (has no administration in time range)    Or  ondansetron (ZOFRAN) injection 4 mg (has no administration in time range)  iohexol (OMNIPAQUE) 350 MG/ML injection 65 mL (65 mLs Intravenous Contrast Given 05/11/23 1601)  morphine (PF) 4 MG/ML injection 4 mg (4 mg Intravenous Given 05/11/23 1654)  ondansetron (ZOFRAN) injection 4 mg (4 mg Intravenous Given 05/11/23 1654)  heparin bolus via infusion 3,000 Units (3,000 Units Intravenous Bolus from Bag 05/11/23 1741)    Mobility walks     Focused Assessments Pulmonary Assessment Handoff:  Lung sounds:   O2 Device: Room Air      R Recommendations: See Admitting Provider Note  Report given to:    Additional Notes:

## 2023-05-11 NOTE — ED Provider Triage Note (Signed)
Emergency Medicine Provider Triage Evaluation Note  Emily Cameron , a 62 y.o. female  was evaluated in triage.  Pt complains of right-sided central chest pain x 2 days.  Similar to her prior PE.Marland Kitchen  Review of Systems  Positive: Dyspnea on exertion pleuritic pain Negative: No syncope  Physical Exam  BP 116/82 (BP Location: Right Arm)   Pulse (!) 103   Temp 97.6 F (36.4 C) (Oral)   Resp 17   Ht 1.626 m (5\' 4" )   Wt 47.6 kg   LMP 01/26/2004   SpO2 100%   BMI 18.02 kg/m  Gen:   Awake, no distress   Resp:  Normal effort  MSK:   Moves extremities without difficulty  Other:    Medical Decision Making  Medically screening exam initiated at 11:27 AM.  Appropriate orders placed.  Emily Cameron was informed that the remainder of the evaluation will be completed by another provider, this initial triage assessment does not replace that evaluation, and the importance of remaining in the ED until their evaluation is complete.     Lorre Nick, MD 05/11/23 1128

## 2023-05-11 NOTE — Progress Notes (Signed)
ANTICOAGULATION CONSULT NOTE - Initial Consult  Pharmacy Consult for Heparin Indication: pulmonary embolus  No Known Allergies  Patient Measurements: Height: 5\' 4"  (162.6 cm) Weight: 47.6 kg (105 lb) IBW/kg (Calculated) : 54.7 Heparin Dosing Weight: 47.6 kg  Vital Signs: Temp: 98.5 F (36.9 C) (09/18 1542) Temp Source: Oral (09/18 1542) BP: 136/84 (09/18 1700) Pulse Rate: 113 (09/18 1700)  Labs: Recent Labs    05/11/23 1123 05/11/23 1547 05/11/23 1553  HGB 12.9  --  14.3  HCT 38.1  --  42.0  PLT 243  --   --   CREATININE 0.79  --  0.60  TROPONINIHS 8 8  --     Estimated Creatinine Clearance: 56.9 mL/min (by C-G formula based on SCr of 0.6 mg/dL).   Medical History: Past Medical History:  Diagnosis Date   Alcohol abuse    Anemia    Anxiety    Arthritis    Cancer of hepatic flexure (HCC)    COPD (chronic obstructive pulmonary disease) (HCC)    Depression    DVT (deep venous thrombosis) (HCC) 12/24/2013   GERD (gastroesophageal reflux disease)    History of kidney stones    Hypertension    Insomnia    Other pulmonary embolism without acute cor pulmonale (HCC) 03/05/2011   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 Marijuna not taking since 7 month.    Sleep apnea    Weight loss    due to anxiety    Medications:  (Not in a hospital admission)  Scheduled:   heparin  3,000 Units Intravenous Once   Infusions:   heparin     PRN:   Assessment: 26 yof with a history of anemia, rectal cancer s/p excision (no chemo), DVT and PE in 2016 and 2012 not on AC. Patient is presenting with CP and SOB. Heparin per pharmacy consult placed for pulmonary embolus.  CTA PE w/ evidence of PE no RHS  Patient is not on anticoagulation prior to arrival.  Hgb 14.3; plt 243  Goal of  Therapy:  Heparin level 0.3-0.7 units/ml Monitor platelets by anticoagulation protocol: Yes   Plan:  Give IV heparin 3000 units bolus x 1 Start heparin infusion at 850 units/hr Check anti-Xa level in 8 hours and daily while on heparin Continue to monitor H&H and platelets  Delmar Landau, PharmD, BCPS 05/11/2023 5:33 PM ED Clinical Pharmacist -  [phone]

## 2023-05-12 ENCOUNTER — Other Ambulatory Visit (HOSPITAL_COMMUNITY): Payer: Self-pay

## 2023-05-12 ENCOUNTER — Observation Stay (HOSPITAL_BASED_OUTPATIENT_CLINIC_OR_DEPARTMENT_OTHER): Payer: Medicaid Other

## 2023-05-12 DIAGNOSIS — I82451 Acute embolism and thrombosis of right peroneal vein: Secondary | ICD-10-CM | POA: Diagnosis present

## 2023-05-12 DIAGNOSIS — F419 Anxiety disorder, unspecified: Secondary | ICD-10-CM | POA: Diagnosis present

## 2023-05-12 DIAGNOSIS — F1120 Opioid dependence, uncomplicated: Secondary | ICD-10-CM | POA: Diagnosis present

## 2023-05-12 DIAGNOSIS — C182 Malignant neoplasm of ascending colon: Secondary | ICD-10-CM | POA: Diagnosis present

## 2023-05-12 DIAGNOSIS — R918 Other nonspecific abnormal finding of lung field: Secondary | ICD-10-CM | POA: Diagnosis not present

## 2023-05-12 DIAGNOSIS — Z90710 Acquired absence of both cervix and uterus: Secondary | ICD-10-CM | POA: Diagnosis not present

## 2023-05-12 DIAGNOSIS — I2694 Multiple subsegmental pulmonary emboli without acute cor pulmonale: Secondary | ICD-10-CM | POA: Diagnosis present

## 2023-05-12 DIAGNOSIS — Z800 Family history of malignant neoplasm of digestive organs: Secondary | ICD-10-CM | POA: Diagnosis not present

## 2023-05-12 DIAGNOSIS — R079 Chest pain, unspecified: Secondary | ICD-10-CM

## 2023-05-12 DIAGNOSIS — Z83438 Family history of other disorder of lipoprotein metabolism and other lipidemia: Secondary | ICD-10-CM | POA: Diagnosis not present

## 2023-05-12 DIAGNOSIS — I2699 Other pulmonary embolism without acute cor pulmonale: Secondary | ICD-10-CM

## 2023-05-12 DIAGNOSIS — J449 Chronic obstructive pulmonary disease, unspecified: Secondary | ICD-10-CM | POA: Diagnosis present

## 2023-05-12 DIAGNOSIS — R911 Solitary pulmonary nodule: Secondary | ICD-10-CM | POA: Diagnosis not present

## 2023-05-12 DIAGNOSIS — G894 Chronic pain syndrome: Secondary | ICD-10-CM | POA: Diagnosis present

## 2023-05-12 DIAGNOSIS — F39 Unspecified mood [affective] disorder: Secondary | ICD-10-CM | POA: Diagnosis present

## 2023-05-12 DIAGNOSIS — Z8249 Family history of ischemic heart disease and other diseases of the circulatory system: Secondary | ICD-10-CM | POA: Diagnosis not present

## 2023-05-12 DIAGNOSIS — F1010 Alcohol abuse, uncomplicated: Secondary | ICD-10-CM | POA: Diagnosis present

## 2023-05-12 DIAGNOSIS — Z79899 Other long term (current) drug therapy: Secondary | ICD-10-CM | POA: Diagnosis not present

## 2023-05-12 DIAGNOSIS — D72829 Elevated white blood cell count, unspecified: Secondary | ICD-10-CM | POA: Diagnosis present

## 2023-05-12 DIAGNOSIS — J90 Pleural effusion, not elsewhere classified: Secondary | ICD-10-CM | POA: Diagnosis not present

## 2023-05-12 DIAGNOSIS — F17210 Nicotine dependence, cigarettes, uncomplicated: Secondary | ICD-10-CM | POA: Diagnosis present

## 2023-05-12 DIAGNOSIS — Z86711 Personal history of pulmonary embolism: Secondary | ICD-10-CM

## 2023-05-12 DIAGNOSIS — Z86718 Personal history of other venous thrombosis and embolism: Secondary | ICD-10-CM | POA: Diagnosis not present

## 2023-05-12 DIAGNOSIS — R0789 Other chest pain: Secondary | ICD-10-CM | POA: Diagnosis present

## 2023-05-12 DIAGNOSIS — J984 Other disorders of lung: Secondary | ICD-10-CM | POA: Diagnosis not present

## 2023-05-12 DIAGNOSIS — I10 Essential (primary) hypertension: Secondary | ICD-10-CM | POA: Diagnosis present

## 2023-05-12 DIAGNOSIS — F32A Depression, unspecified: Secondary | ICD-10-CM | POA: Diagnosis present

## 2023-05-12 DIAGNOSIS — K219 Gastro-esophageal reflux disease without esophagitis: Secondary | ICD-10-CM | POA: Diagnosis present

## 2023-05-12 DIAGNOSIS — E8721 Acute metabolic acidosis: Secondary | ICD-10-CM | POA: Diagnosis present

## 2023-05-12 DIAGNOSIS — E876 Hypokalemia: Secondary | ICD-10-CM | POA: Diagnosis present

## 2023-05-12 LAB — RENAL FUNCTION PANEL
Albumin: 2.7 g/dL — ABNORMAL LOW (ref 3.5–5.0)
Anion gap: 10 (ref 5–15)
BUN: 14 mg/dL (ref 6–20)
CO2: 18 mmol/L — ABNORMAL LOW (ref 22–32)
Calcium: 8.7 mg/dL — ABNORMAL LOW (ref 8.9–10.3)
Chloride: 110 mmol/L (ref 98–111)
Creatinine, Ser: 0.84 mg/dL (ref 0.44–1.00)
GFR, Estimated: >60 mL/min (ref >60)
Glucose, Bld: 203 mg/dL — ABNORMAL HIGH (ref 70–99)
Phosphorus: 1.4 mg/dL — ABNORMAL LOW (ref 2.5–4.6)
Potassium: 4.4 mmol/L (ref 3.5–5.1)
Sodium: 138 mmol/L (ref 135–145)

## 2023-05-12 LAB — ECHOCARDIOGRAM COMPLETE
AR max vel: 2.25 cm2
AV Area VTI: 2.04 cm2
AV Area mean vel: 2.12 cm2
AV Mean grad: 6 mmHg
AV Peak grad: 10.8 mmHg
Ao pk vel: 1.64 m/s
Area-P 1/2: 4.8 cm2
Height: 64 in
S' Lateral: 2.2 cm
Weight: 1680 oz

## 2023-05-12 LAB — CBC
HCT: 34.3 % — ABNORMAL LOW (ref 36.0–46.0)
Hemoglobin: 11.7 g/dL — ABNORMAL LOW (ref 12.0–15.0)
MCH: 32.1 pg (ref 26.0–34.0)
MCHC: 34.1 g/dL (ref 30.0–36.0)
MCV: 94.2 fL (ref 80.0–100.0)
Platelets: 226 10*3/uL (ref 150–400)
RBC: 3.64 MIL/uL — ABNORMAL LOW (ref 3.87–5.11)
RDW: 13.7 % (ref 11.5–15.5)
WBC: 14.1 10*3/uL — ABNORMAL HIGH (ref 4.0–10.5)
nRBC: 0 % (ref 0.0–0.2)

## 2023-05-12 LAB — HIV ANTIBODY (ROUTINE TESTING W REFLEX): HIV Screen 4th Generation wRfx: NONREACTIVE

## 2023-05-12 LAB — MAGNESIUM
Magnesium: 1.9 mg/dL (ref 1.7–2.4)
Magnesium: 1.9 mg/dL (ref 1.7–2.4)

## 2023-05-12 LAB — HEPARIN LEVEL (UNFRACTIONATED)
Heparin Unfractionated: 0.17 IU/mL — ABNORMAL LOW (ref 0.30–0.70)
Heparin Unfractionated: 0.26 IU/mL — ABNORMAL LOW (ref 0.30–0.70)

## 2023-05-12 MED ORDER — AMITRIPTYLINE HCL 50 MG PO TABS
50.0000 mg | ORAL_TABLET | Freq: Every day | ORAL | Status: DC
  Administered 2023-05-12: 50 mg via ORAL
  Filled 2023-05-12: qty 1

## 2023-05-12 MED ORDER — APIXABAN 5 MG PO TABS
10.0000 mg | ORAL_TABLET | Freq: Two times a day (BID) | ORAL | Status: DC
  Administered 2023-05-12 – 2023-05-13 (×2): 10 mg via ORAL
  Filled 2023-05-12 (×2): qty 2

## 2023-05-12 MED ORDER — IPRATROPIUM-ALBUTEROL 0.5-2.5 (3) MG/3ML IN SOLN: 3.0000 mL | RESPIRATORY_TRACT | Status: DC | PRN

## 2023-05-12 MED ORDER — ENSURE ENLIVE PO LIQD
237.0000 mL | Freq: Two times a day (BID) | ORAL | Status: DC
  Administered 2023-05-12 – 2023-05-13 (×2): 237 mL via ORAL

## 2023-05-12 MED ORDER — APIXABAN 5 MG PO TABS: 5.0000 mg | ORAL_TABLET | Freq: Two times a day (BID) | ORAL | Status: DC

## 2023-05-12 MED ORDER — GUAIFENESIN 100 MG/5ML PO LIQD
5.0000 mL | ORAL | Status: DC | PRN
  Administered 2023-05-12 – 2023-05-13 (×2): 5 mL via ORAL
  Filled 2023-05-12 (×2): qty 15

## 2023-05-12 MED ORDER — HEPARIN BOLUS VIA INFUSION
1000.0000 [IU] | Freq: Once | INTRAVENOUS | Status: AC
  Administered 2023-05-12: 1000 [IU] via INTRAVENOUS
  Filled 2023-05-12: qty 1000

## 2023-05-12 MED ORDER — IPRATROPIUM-ALBUTEROL 0.5-2.5 (3) MG/3ML IN SOLN
3.0000 mL | Freq: Four times a day (QID) | RESPIRATORY_TRACT | Status: DC
  Administered 2023-05-12: 3 mL via RESPIRATORY_TRACT
  Filled 2023-05-12: qty 3

## 2023-05-12 NOTE — Progress Notes (Signed)
ANTICOAGULATION CONSULT NOTE - Follow-up Consult   Pharmacy Consult for Heparin Indication: pulmonary embolus  No Known Allergies  Patient Measurements: Height: 5\' 4"  (162.6 cm) Weight: 47.6 kg (105 lb) IBW/kg (Calculated) : 54.7 Heparin Dosing Weight: 47.6 kg  Vital Signs: Temp: 98.4 F (36.9 C) (09/19 0357) BP: 127/71 (09/19 0847) Pulse Rate: 114 (09/19 0847)  Labs: Recent Labs    05/11/23 1123 05/11/23 1547 05/11/23 1553 05/12/23 0029 05/12/23 0947  HGB 12.9  --  14.3  --  11.7*  HCT 38.1  --  42.0  --  34.3*  PLT 243  --   --   --  226  HEPARINUNFRC  --   --   --  0.26* 0.17*  CREATININE 0.79  --  0.60  --  0.84  TROPONINIHS 8 8  --   --   --     Estimated Creatinine Clearance: 54.2 mL/min (by C-G formula based on SCr of 0.84 mg/dL).  Assessment: 42 yof with a history of anemia, rectal cancer s/p excision (no chemo), DVT and PE in 2016 and 2012 not on AC. Patient is presenting with CP and SOB. Heparin per pharmacy consult placed for pulmonary embolus, no evidence of RHS on CTA.   9/19 AM: Heparin level subtherapeutic at 0.17, no evidence of bleeding or issues with infusion reported by RN. Hgb trend down from arrival 14.3>>11.7. PLT stable. Apixaban consult received to start evening of 9/19. Apixaban co-pay $4.   Goal of Therapy:  Heparin level 0.3-0.7 units/ml Monitor platelets by anticoagulation protocol: Yes   Plan:  Increase heparin to 1050 units/hr- stop heparin infusion at 2000 05/12/23  Heparin Bolus via infusion 1,000 units x1  START apixaban 10 mg BID x 7 days, followed by 5 mg BID - Administer first dose at the same time heparin infusion is stopped  Continue to monitor H&H and platelets Will need Eliquis education prior to discharge   Jani Gravel, PharmD Clinical Pharmacist  05/12/2023 12:00 PM

## 2023-05-12 NOTE — TOC Benefit Eligibility Note (Signed)
Patient Product/process development scientist completed.    The patient is insured through [facility] MEDICAID.     Ran test claim for Eliquis Starter Pack and the current 30 day co-pay is $4.00.  Ran test claim for Xarelto Starter Pack and the current 30 day co-pay is $4.00.  This test claim was processed through [facility]- copay amounts may vary at other pharmacies due to pharmacy/plan contracts, or as the patient moves through the different stages of their insurance plan.     Roland Earl, CPHT Pharmacy Technician III Certified Patient Advocate [facility] Patient Advocate Team Direct Number: [phone]  Fax: [phone]

## 2023-05-12 NOTE — Discharge Instructions (Signed)
Information on my medicine - ELIQUIS (apixaban)   Why was Eliquis prescribed for you? Eliquis was prescribed to treat blood clots that may have been found in the veins of your legs (deep vein thrombosis) or in your lungs (pulmonary embolism) and to reduce the risk of them occurring again.  What do You need to know about Eliquis ? The starting dose is 10 mg (two 5 mg tablets) taken TWICE daily for the FIRST SEVEN (7) DAYS, then on 05/19/23  the dose is reduced to ONE 5 mg tablet taken TWICE daily.  Eliquis may be taken with or without food.   Try to take the dose about the same time in the morning and in the evening. If you have difficulty swallowing the tablet whole please discuss with your pharmacist how to take the medication safely.  Take Eliquis exactly as prescribed and DO NOT stop taking Eliquis without talking to the doctor who prescribed the medication.  Stopping may increase your risk of developing a new blood clot.  Refill your prescription before you run out.  After discharge, you should have regular check-up appointments with your healthcare provider that is prescribing your Eliquis.    What do you do if you miss a dose? If a dose of ELIQUIS is not taken at the scheduled time, take it as soon as possible on the same day and twice-daily administration should be resumed. The dose should not be doubled to make up for a missed dose.  Important Safety Information A possible side effect of Eliquis is bleeding. You should call your healthcare provider right away if you experience any of the following: Bleeding from an injury or your nose that does not stop. Unusual colored urine (red or dark brown) or unusual colored stools (red or black). Unusual bruising for unknown reasons. A serious fall or if you hit your head (even if there is no bleeding).  Some medicines may interact with Eliquis and might increase your risk of bleeding or clotting while on Eliquis. To help avoid  this, consult your healthcare provider or pharmacist prior to using any new prescription or non-prescription medications, including herbals, vitamins, non-steroidal anti-inflammatory drugs (NSAIDs) and supplements.  This website has more information on Eliquis (apixaban): http://www.eliquis.com/eliquis/home

## 2023-05-12 NOTE — Progress Notes (Signed)
NEW ADMISSION NOTE   Arrival Method: ED stretcher Mental Orientation: AAOx4 Telemetry: [phone] Assessment: Completed Skin: See flowsheet IV: RUE LUE Pain: 10/10 Tubes: n/a Safety Measures: Safety Fall Prevention Plan has been given, discussed and signed Admission: Completed [facility] Orientation: Patient has been orientated to the room, unit and staff.  Family: none at bedside   Orders have been reviewed and implemented. Will continue to monitor the patient. Call light has been placed within reach and bed alarm has been activated.

## 2023-05-12 NOTE — Progress Notes (Signed)
Lower extremity venous duplex completed. Please see CV Procedures for preliminary results.  Initial findings reported to Arvilla Meres, Charity fundraiser.  Shona Simpson, RVT 05/12/23 10:16 AM

## 2023-05-12 NOTE — Progress Notes (Signed)
PROGRESS NOTE    Emily Cameron  NFA:[id number] DOB: [birth year] DOA: 05/11/2023 PCP: Courtney Paris, NP   Brief Narrative:  62 y.o. female with PMH of PE/DVT in 2012 and 2016 currently not on anticoagulation, stage IIa colon cancer status post robotic colectomy in 10/2022 OSA not on CPAP, COPD, chronic pain syndrome, anxiety and depression presented with chest pain.  On presentation, she was tachycardic and slightly tachypneic. CT angio chest showed acute pulmonary embolism in RML and RLL pulmonary arteries without cor pulmonale but with possible lung infarct, small right pleural effusion with some loculation and 8 x 5 mm RUL nodule.  Patient was started on IV heparin.   Assessment & Plan:   Acute PE without cor pulmonale -atient with prior history of DVT/PE in 2012 and 2016.  She is no longer on anticoagulation.  She says she was taken off anticoagulation due to alcohol abuse.  She had right lower extremity swelling about 2 weeks prior.  She is also on Megace which could increase her risk of VTE.  -Currently on heparin drip.  Continues to have chest pain.  Does not feel ready to go home today.  Continue heparin drip for now.  Follow 2D echo and lower extremity duplex ultrasound.  Possibly start Eliquis from tonight and possibly discharge home today. -DC Megace  Chronic COPD? Tobacco abuse -Does not seem to be on inhalers.  Encouraged tobacco cessation by admitting hospitalist  Sleep apnea -Not on CPAP.  Outpatient follow-up  Stage IIa colon cancer status post robotic colectomy in 10/2022 -Outpatient follow-up with oncology  Mood disorder -Continue mirtazapine, amitriptyline and quetiapine.  Outpatient follow-up with PCP and/or psychiatry  Leukocytosis -Possibly reactive.  Monitor  Hypokalemia -Labs pending for today.  Monitor  Chronic pain syndrome with opiate dependence -Continue as needed Percocet.  Outpatient follow-up with PCP and/or pain management  Pulmonary nodule - CT  angio showed 8 x 5 mm unchanged irregular nodule in right apex -Outpatient follow-up.   DVT prophylaxis: Heparin drip Code Status: Full Family Communication: None at bedside Disposition Plan: Status is: Observation The patient will require care spanning > 2 midnights and should be moved to inpatient because: Of severity of illness.  Still complains of chest pain and currently on heparin drip.    Consultants: None  Procedures: None  Antimicrobials: None   Subjective: Patient seen and examined at bedside.  Does not feel well and complains of chest pain.  No fever, vomiting, abdominal pain reported.  Does not feel ready to go home today.  Objective: Vitals:   05/11/23 2333 05/12/23 0357 05/12/23 0842 05/12/23 0847  BP: 123/84 104/68  127/71  Pulse: 100 (!) 108 100 (!) 114  Resp: 19 20 20 18   Temp: 98.5 F (36.9 C) 98.4 F (36.9 C)    TempSrc:      SpO2: 94% 94% 94% 96%  Weight:      Height:        Intake/Output Summary (Last 24 hours) at 05/12/2023 1130 Last data filed at 05/12/2023 0234 Gross per 24 hour  Intake 540 ml  Output --  Net 540 ml   Filed Weights   05/11/23 1110  Weight: 47.6 kg    Examination:  General exam: Appears calm and comfortable.  Chronically ill and deconditioned.  On room air. Respiratory system: Bilateral decreased breath sounds at bases with scattered crackles and intermittent tachypnea Cardiovascular system: S1 & S2 heard, intermittently tachycardic  Abdomen gastrointestinal system: Abdomen is nondistended, soft and nontender. Normal  bowel sounds heard. Extremities: No cyanosis, clubbing, edema  Central nervous system: Alert and oriented.  Slow to respond.  Poor historian.  No focal neurological deficits. Moving extremities Skin: No rashes, lesions or ulcers Psychiatry: Looks anxious intermittently; Not agitated.   Data Reviewed: I have personally reviewed following labs and imaging studies  CBC: Recent Labs  Lab 05/11/23 1123  05/11/23 1553 05/12/23 0947  WBC 13.1*  --  14.1*  HGB 12.9 14.3 11.7*  HCT 38.1 42.0 34.3*  MCV 94.1  --  94.2  PLT 243  --  226   Basic Metabolic Panel: Recent Labs  Lab 05/11/23 1123 05/11/23 1553 05/12/23 0029  NA 138 139  --   K 3.1* 3.2*  --   CL 108 109  --   CO2 21*  --   --   GLUCOSE 97 83  --   BUN 8 5*  --   CREATININE 0.79 0.60  --   CALCIUM 8.8*  --   --   MG  --   --  1.9   GFR: Estimated Creatinine Clearance: 56.9 mL/min (by C-G formula based on SCr of 0.6 mg/dL). Liver Function Tests: No results for input(s): "AST", "ALT", "ALKPHOS", "BILITOT", "PROT", "ALBUMIN" in the last 168 hours. No results for input(s): "LIPASE", "AMYLASE" in the last 168 hours. No results for input(s): "AMMONIA" in the last 168 hours. Coagulation Profile: No results for input(s): "INR", "PROTIME" in the last 168 hours. Cardiac Enzymes: No results for input(s): "CKTOTAL", "CKMB", "CKMBINDEX", "TROPONINI" in the last 168 hours. BNP (last 3 results) No results for input(s): "PROBNP" in the last 8760 hours. HbA1C: No results for input(s): "HGBA1C" in the last 72 hours. CBG: No results for input(s): "GLUCAP" in the last 168 hours. Lipid Profile: No results for input(s): "CHOL", "HDL", "LDLCALC", "TRIG", "CHOLHDL", "LDLDIRECT" in the last 72 hours. Thyroid Function Tests: No results for input(s): "TSH", "T4TOTAL", "FREET4", "T3FREE", "THYROIDAB" in the last 72 hours. Anemia Panel: No results for input(s): "VITAMINB12", "FOLATE", "FERRITIN", "TIBC", "IRON", "RETICCTPCT" in the last 72 hours. Sepsis Labs: No results for input(s): "PROCALCITON", "LATICACIDVEN" in the last 168 hours.  No results found for this or any previous visit (from the past 240 hour(s)).     Scheduled Meds:  amitriptyline  50 mg Oral QHS   ferrous sulfate  325 mg Oral Q breakfast   megestrol  400 mg Oral Daily   mirtazapine  30 mg Oral QHS   pantoprazole  40 mg Oral Daily   QUEtiapine  200 mg Oral QHS    Continuous Infusions:  heparin 900 Units/hr (05/12/23 0234)          Glade Lloyd, MD [facility] 05/12/2023, 11:30 AM

## 2023-05-12 NOTE — Progress Notes (Signed)
ANTICOAGULATION CONSULT NOTE - Initial Consult  Pharmacy Consult for Heparin Indication: pulmonary embolus  No Known Allergies  Patient Measurements: Height: 5\' 4"  (162.6 cm) Weight: 47.6 kg (105 lb) IBW/kg (Calculated) : 54.7 Heparin Dosing Weight: 47.6 kg  Vital Signs: Temp: 98.5 F (36.9 C) (09/18 2333) Temp Source: Oral (09/18 2004) BP: 123/84 (09/18 2333) Pulse Rate: 100 (09/18 2333)  Labs: Recent Labs    05/11/23 1123 05/11/23 1547 05/11/23 1553 05/12/23 0029  HGB 12.9  --  14.3  --   HCT 38.1  --  42.0  --   PLT 243  --   --   --   HEPARINUNFRC  --   --   --  0.26*  CREATININE 0.79  --  0.60  --   TROPONINIHS 8 8  --   --     Estimated Creatinine Clearance: 56.9 mL/min (by C-G formula based on SCr of 0.6 mg/dL).  Assessment: 65 yof with a history of anemia, rectal cancer s/p excision (no chemo), DVT and PE in 2016 and 2012 not on AC. Patient is presenting with CP and SOB. Heparin per pharmacy consult placed for pulmonary embolus.  CTA PE w/ evidence of PE no RHS  Patient is not on anticoagulation prior to arrival.  HL 0.26 - slightly subtherapeutic   Goal of Therapy:  Heparin level 0.3-0.7 units/ml Monitor platelets by anticoagulation protocol: Yes   Plan:  Increase heparin to 900 units/hr Check anti-Xa level daily while on heparin Continue to monitor H&H and platelets  Calton Dach, PharmD, BCCCP Clinical Pharmacist 05/12/2023 1:23 AM

## 2023-05-13 ENCOUNTER — Other Ambulatory Visit (HOSPITAL_COMMUNITY): Payer: Self-pay

## 2023-05-13 DIAGNOSIS — I2699 Other pulmonary embolism without acute cor pulmonale: Secondary | ICD-10-CM | POA: Diagnosis not present

## 2023-05-13 LAB — CBC WITH DIFFERENTIAL/PLATELET
Abs Immature Granulocytes: 0.09 10*3/uL — ABNORMAL HIGH (ref 0.00–0.07)
Basophils Absolute: 0 10*3/uL (ref 0.0–0.1)
Basophils Relative: 0 %
Eosinophils Absolute: 0.1 10*3/uL (ref 0.0–0.5)
Eosinophils Relative: 1 %
HCT: 34.1 % — ABNORMAL LOW (ref 36.0–46.0)
Hemoglobin: 11.5 g/dL — ABNORMAL LOW (ref 12.0–15.0)
Immature Granulocytes: 1 %
Lymphocytes Relative: 12 %
Lymphs Abs: 1.8 10*3/uL (ref 0.7–4.0)
MCH: 31.9 pg (ref 26.0–34.0)
MCHC: 33.7 g/dL (ref 30.0–36.0)
MCV: 94.7 fL (ref 80.0–100.0)
Monocytes Absolute: 1.1 10*3/uL — ABNORMAL HIGH (ref 0.1–1.0)
Monocytes Relative: 7 %
Neutro Abs: 12.1 10*3/uL — ABNORMAL HIGH (ref 1.7–7.7)
Neutrophils Relative %: 79 %
Platelets: 239 10*3/uL (ref 150–400)
RBC: 3.6 MIL/uL — ABNORMAL LOW (ref 3.87–5.11)
RDW: 13.6 % (ref 11.5–15.5)
WBC: 15.2 10*3/uL — ABNORMAL HIGH (ref 4.0–10.5)
nRBC: 0 % (ref 0.0–0.2)

## 2023-05-13 LAB — BASIC METABOLIC PANEL
Anion gap: 6 (ref 5–15)
BUN: 12 mg/dL (ref 6–20)
CO2: 23 mmol/L (ref 22–32)
Calcium: 8.8 mg/dL — ABNORMAL LOW (ref 8.9–10.3)
Chloride: 109 mmol/L (ref 98–111)
Creatinine, Ser: 0.73 mg/dL (ref 0.44–1.00)
GFR, Estimated: >60 mL/min (ref >60)
Glucose, Bld: 94 mg/dL (ref 70–99)
Potassium: 3.7 mmol/L (ref 3.5–5.1)
Sodium: 138 mmol/L (ref 135–145)

## 2023-05-13 LAB — MAGNESIUM: Magnesium: 2 mg/dL (ref 1.7–2.4)

## 2023-05-13 MED ORDER — APIXABAN 5 MG PO TABS: 5.0000 mg | ORAL_TABLET | Freq: Two times a day (BID) | ORAL | 2 refills | Status: DC

## 2023-05-13 MED ORDER — APIXABAN (ELIQUIS) VTE STARTER PACK (10MG AND 5MG)
ORAL_TABLET | ORAL | 0 refills | Status: DC
  Filled 2023-05-13: qty 74, 30d supply, fill #0

## 2023-05-13 MED ORDER — APIXABAN (ELIQUIS) VTE STARTER PACK (10MG AND 5MG): ORAL_TABLET | ORAL | 0 refills | Status: DC

## 2023-05-13 NOTE — Discharge Summary (Addendum)
Physician Discharge Summary  Emily Cameron NWG:[id number] DOB: 10/19/1960 DOA: 05/11/2023  PCP: Courtney Paris, NP  Admit date: 05/11/2023 Discharge date: 05/13/2023  Admitted From: Home Disposition: Home  Recommendations for Outpatient Follow-up:  Follow up with PCP in 1 week with repeat CBC/BMP Follow up in ED if symptoms worsen or new appear   Home Health: No Equipment/Devices: None  Discharge Condition: Stable CODE STATUS: Full Diet recommendation: Heart healthy  Brief/Interim Summary: 62 y.o. female with PMH of PE/DVT in 2012 and 2016 currently not on anticoagulation, stage IIa colon cancer status post robotic colectomy in 10/2022 OSA not on CPAP, COPD, chronic pain syndrome, anxiety and depression presented with chest pain.  On presentation, she was tachycardic and slightly tachypneic. CT angio chest showed acute pulmonary embolism in RML and RLL pulmonary arteries without cor pulmonale but with possible lung infarct, small right pleural effusion with some loculation and 8 x 5 mm RUL nodule.  Patient was started on IV heparin.  During the hospitalization, her condition has improved.  She has already been switched to oral Eliquis.  Echo showed EF of 70 to 75%.  Lower extremity duplex showed chronic DVT involving the right peroneal veins.  She feels better and feels okay to go home today.  She will be discharged home on oral Eliquis.  Discharge Diagnoses:   Acute multiple subsegmental pulmonary emboli to RML and RLL pulmonary arteries  without cor pulmonale Chronic right peroneal vein DVT -atient with prior history of DVT/PE in 2012 and 2016.  She is no longer on anticoagulation.  She says she was taken off anticoagulation due to alcohol abuse.  She had right lower extremity swelling about 2 weeks prior.  She is also on Megace which could increase her risk of VTE.  -Treated with heparin drip.   -DC'd Megace -During the hospitalization, her condition has improved.  She has already been  switched to oral Eliquis.  Echo showed EF of 70 to 75%.  Lower extremity duplex showed chronic DVT involving the right peroneal veins.  She feels better and feels okay to go home today.  She will be discharged home on oral Eliquis.   Chronic COPD? Tobacco abuse -Does not seem to be on inhalers.  Encouraged tobacco cessation by admitting hospitalist.  Outpatient follow-up with PCP   Sleep apnea -Not on CPAP.  Outpatient follow-up   Stage IIa colon cancer status post robotic colectomy in 10/2022 -Outpatient follow-up with oncology   Mood disorder -Continue mirtazapine, amitriptyline and quetiapine.  Outpatient follow-up with PCP and/or psychiatry   Leukocytosis -Possibly reactive.  No labs today.  Outpatient follow-up.   Hypokalemia -Improved  Hypophosphatemia -No labs today  Acute metabolic acidosis -No labs today.  Outpatient follow-up.   Chronic pain syndrome with opiate dependence -Continue as needed Percocet.  Outpatient follow-up with PCP and/or pain management   Pulmonary nodule - CT angio showed 8 x 5 mm unchanged irregular nodule in right apex -Outpatient follow-up.     Discharge Instructions  Discharge Instructions     Diet general   Complete by: As directed    Increase activity slowly   Complete by: As directed       Allergies as of 05/13/2023   No Known Allergies      Medication List     STOP taking these medications    Ibuprofen-diphenhydrAMINE HCl 200-25 MG Caps   megestrol 625 MG/5ML suspension Commonly known as: MEGACE ES       TAKE these medications  amitriptyline 50 MG tablet Commonly known as: ELAVIL Take 50 mg by mouth at bedtime.   Eliquis DVT/PE Starter Pack Generic drug: Apixaban Starter Pack (10mg  and 5mg ) Take as directed on package: start with two-5mg  tablets twice daily for 7 days. On day 8, switch to one-5mg  tablet twice daily.   apixaban 5 MG Tabs tablet Commonly known as: ELIQUIS Take 1 tablet (5 mg total) by  mouth 2 (two) times daily. Start after completion of apixaban starter pack Start taking on: June 09, 2023   FeroSul 325 (65 FE) MG tablet Generic drug: ferrous sulfate Take 325 mg by mouth daily with breakfast.   gabapentin 600 MG tablet Commonly known as: NEURONTIN Take 1 tablet (600 mg total) by mouth 3 (three) times daily as needed (pain). What changed: when to take this   mirtazapine 30 MG tablet Commonly known as: REMERON Take 1 tablet (30 mg total) by mouth at bedtime.   omeprazole 40 MG capsule Commonly known as: PRILOSEC Take 1 capsule (40 mg total) by mouth 2 (two) times daily as needed (acid reflux).   oxyCODONE-acetaminophen 10-325 MG tablet Commonly known as: PERCOCET Take 0.5-1 tablets by mouth every 6 (six) hours as needed for pain.   potassium chloride 10 MEQ tablet Commonly known as: KLOR-CON Take 1 tablet (10 mEq total) by mouth daily.   QUEtiapine 200 MG tablet Commonly known as: SEROQUEL Take 1 tablet (200 mg total) by mouth at bedtime.   tiZANidine 4 MG capsule Commonly known as: ZANAFLEX Take 4 mg by mouth 3 (three) times daily as needed for muscle spasms.   VISINE OP Place 2 drops into both eyes as needed (for dry eyes). Reported on 09/09/2015          Follow-up Information     Courtney Paris, NP. Schedule an appointment as soon as possible for a visit in 1 week(s).   Specialty: Nurse Practitioner Contact information: [address] [place] [address] [phone]                No Known Allergies  Consultations: None   Procedures/Studies: VAS [place] LOWER EXTREMITY VENOUS (DVT)  Result Date: 05/12/2023  Lower Venous DVT Study Patient Name:  Emily Cameron  Date of Exam:   05/12/2023 Medical Rec #: [id number]       Accession #:    [id number] Date of Birth: 07-04-1961       Patient Gender: F Patient Age:   62 years Exam Location:  [facility] Procedure:      VAS [place] LOWER EXTREMITY VENOUS (DVT) Referring Phys: Alwyn Ren GONFA  --------------------------------------------------------------------------------  Indications: Pulmonary embolism.  Risk Factors: Recurrent PE 2006/2012 Cancer Hepatic flexure. Anticoagulation: Heparin. Comparison Study: No prior study Performing Technologist: Shona Simpson  Examination Guidelines: A complete evaluation includes B-mode imaging, spectral Doppler, color Doppler, and power Doppler as needed of all accessible portions of each vessel. Bilateral testing is considered an integral part of a complete examination. Limited examinations for reoccurring indications may be performed as noted. The reflux portion of the exam is performed with the patient in reverse Trendelenburg.  +--------+---------------+---------+-----------+----------------+-------------+ RIGHT   CompressibilityPhasicitySpontaneityProperties      Thrombus  Aging         +--------+---------------+---------+-----------+----------------+-------------+ CFV     Full           Yes      Yes                                      +--------+---------------+---------+-----------+----------------+-------------+ SFJ     Full                                                             +--------+---------------+---------+-----------+----------------+-------------+ FV Prox Full                                                             +--------+---------------+---------+-----------+----------------+-------------+ FV Mid  Full                                                             +--------+---------------+---------+-----------+----------------+-------------+ FV      Full                                                             Distal                                                                   +--------+---------------+---------+-----------+----------------+-------------+ PFV     Full                                                              +--------+---------------+---------+-----------+----------------+-------------+ POP     Full           Yes      Yes                                      +--------+---------------+---------+-----------+----------------+-------------+ PTV     Full                                                             +--------+---------------+---------+-----------+----------------+-------------+ PERO  None                               rigid           Chronic                                                  w/compression                 +--------+---------------+---------+-----------+----------------+-------------+ 1 of 2 Peroneal Veins occluded in the mid calf  +---------+---------------+---------+-----------+----------+--------------+ LEFT     CompressibilityPhasicitySpontaneityPropertiesThrombus Aging +---------+---------------+---------+-----------+----------+--------------+ CFV      Full           Yes      Yes                                 +---------+---------------+---------+-----------+----------+--------------+ SFJ      Full                                                        +---------+---------------+---------+-----------+----------+--------------+ FV Prox  Full                                                        +---------+---------------+---------+-----------+----------+--------------+ FV Mid   Full                                                        +---------+---------------+---------+-----------+----------+--------------+ FV DistalFull                                                        +---------+---------------+---------+-----------+----------+--------------+ PFV      Full                                                        +---------+---------------+---------+-----------+----------+--------------+ POP      Full           Yes      Yes                                  +---------+---------------+---------+-----------+----------+--------------+ PTV      Full                                                        +---------+---------------+---------+-----------+----------+--------------+  PERO     Full                                                        +---------+---------------+---------+-----------+----------+--------------+     Summary: RIGHT: - Findings consistent with chronic deep vein thrombosis involving the right peroneal veins.  - No cystic structure found in the popliteal fossa.  LEFT: - There is no evidence of deep vein thrombosis in the lower extremity.  - No cystic structure found in the popliteal fossa.  *See table(s) above for measurements and observations. Electronically signed by Gerarda Fraction on 05/12/2023 at 4:08:39 PM.    Final    ECHOCARDIOGRAM COMPLETE  Result Date: 05/12/2023    ECHOCARDIOGRAM REPORT   Patient Name:   Emily Cameron Date of Exam: 05/12/2023 Medical Rec #:  [id number]      Height:       64.0 in Accession #:    [id number]     Weight:       105.0 lb Date of Birth:  [birth year]      BSA:          1.488 m Patient Age:    61 years       BP:           127/71 mmHg Patient Gender: F              HR:           111 bpm. Exam Location:  Inpatient Procedure: 2D Echo, Color Doppler and Cardiac Doppler Indications:    Pulmonary Embolus  History:        Patient has no prior history of Echocardiogram examinations.                 COPD and Pulmonary Embolus; Risk Factors:Current Smoker, Sleep                 Apnea and Dyslipidemia.  Sonographer:    Milbert Coulter Referring Phys: [id number] Boyce Medici GONFA  Sonographer Comments: Image acquisition challenging due to respiratory motion. IMPRESSIONS  1. Left ventricular ejection fraction, by estimation, is 70 to 75%. The left ventricle has hyperdynamic function. The left ventricle has no regional wall motion abnormalities. There is severe asymmetric left ventricular hypertrophy of the basal-septal segment.  Left ventricular diastolic parameters are indeterminate.  2. Right ventricular systolic function is hyperdynamic. The right ventricular size is severely enlarged. Tricuspid regurgitation signal is inadequate for assessing PA pressure.  3. The mitral valve was not well visualized. No evidence of mitral valve regurgitation. No evidence of mitral stenosis.  4. The aortic valve was not well visualized. Aortic valve regurgitation is not visualized. No aortic stenosis is present.  5. The inferior vena cava is normal in size with greater than 50% respiratory variability, suggesting right atrial pressure of 3 mmHg.  6. Technically difficult study. Comparison(s): No prior Echocardiogram. FINDINGS  Left Ventricle: Left ventricular ejection fraction, by estimation, is 70 to 75%. The left ventricle has hyperdynamic function. The left ventricle has no regional wall motion abnormalities. The left ventricular internal cavity size was small. There is severe asymmetric left ventricular hypertrophy of the basal-septal segment. Left ventricular diastolic function could not be evaluated due to atrial fibrillation. Left ventricular diastolic parameters are indeterminate. Right Ventricle: The right ventricular size is severely enlarged. Right vetricular  wall thickness was not well visualized. Right ventricular systolic function is hyperdynamic. Tricuspid regurgitation signal is inadequate for assessing PA pressure. Left Atrium: Left atrial size was normal in size. Right Atrium: Right atrial size was normal in size. Pericardium: Trivial pericardial effusion is present. The pericardial effusion is anterior to the right ventricle. Mitral Valve: The mitral valve was not well visualized. No evidence of mitral valve regurgitation. No evidence of mitral valve stenosis. Tricuspid Valve: The tricuspid valve is not well visualized. Tricuspid valve regurgitation is trivial. No evidence of tricuspid stenosis. Aortic Valve: The aortic valve was not  well visualized. Aortic valve regurgitation is not visualized. No aortic stenosis is present. Aortic valve mean gradient measures 6.0 mmHg. Aortic valve peak gradient measures 10.8 mmHg. Aortic valve area, by VTI measures 2.04 cm. Pulmonic Valve: The pulmonic valve was normal in structure. Pulmonic valve regurgitation is not visualized. No evidence of pulmonic stenosis. Aorta: The aortic root and ascending aorta are structurally normal, with no evidence of dilitation. Venous: The inferior vena cava is normal in size with greater than 50% respiratory variability, suggesting right atrial pressure of 3 mmHg. IAS/Shunts: No atrial level shunt detected by color flow Doppler.  LEFT VENTRICLE PLAX 2D LVIDd:         3.20 cm   Diastology LVIDs:         2.20 cm   LV e' medial:    10.70 cm/s LV PW:         1.10 cm   LV E/e' medial:  6.3 LV IVS:        1.10 cm   LV e' lateral:   14.80 cm/s LVOT diam:     1.90 cm   LV E/e' lateral: 4.5 LV SV:         47 LV SV Index:   32 LVOT Area:     2.84 cm  RIGHT VENTRICLE RV Basal diam:  3.20 cm RV S prime:     22.20 cm/s TAPSE (M-mode): 2.9 cm LEFT ATRIUM             Index        RIGHT ATRIUM           Index LA diam:        2.60 cm 1.75 cm/m   RA Area:     14.10 cm LA Vol (A2C):   21.3 ml 14.32 ml/m  RA Volume:   35.20 ml  23.66 ml/m LA Vol (A4C):   33.4 ml 22.45 ml/m LA Biplane Vol: 28.1 ml 18.89 ml/m  AORTIC VALVE                     PULMONIC VALVE AV Area (Vmax):    2.25 cm      PV Vmax:        1.28 m/s AV Area (Vmean):   2.12 cm      PV Vmean:       88.200 cm/s AV Area (VTI):     2.04 cm      PV VTI:         0.214 m AV Vmax:           164.00 cm/s   PV Peak grad:   6.6 mmHg AV Vmean:          110.000 cm/s  PV Mean grad:   4.0 mmHg AV VTI:            0.232 m       RVOT  Peak grad: 6 mmHg AV Peak Grad:      10.8 mmHg AV Mean Grad:      6.0 mmHg LVOT Vmax:         130.00 cm/s LVOT Vmean:        82.300 cm/s LVOT VTI:          0.167 m LVOT/AV VTI ratio: 0.72  AORTA Ao Root diam:  2.60 cm Ao Asc diam:  2.50 cm MITRAL VALVE MV Area (PHT): 4.80 cm    SHUNTS MV Decel Time: 158 msec    Systemic VTI:  0.17 m MV E velocity: 67.00 cm/s  Systemic Diam: 1.90 cm MV A velocity: 73.90 cm/s  Pulmonic VTI:  0.175 m MV E/A ratio:  0.91 Riley Lam MD Electronically signed by Riley Lam MD Signature Date/Time: 05/12/2023/12:06:30 PM    Final    CT Angio Chest PE W/Cm &/Or Wo Cm  Result Date: 05/11/2023 CLINICAL DATA:  History of colon cancer with three day history of chest pain EXAM: CT ANGIOGRAPHY CHEST WITH CONTRAST TECHNIQUE: Multidetector CT imaging of the chest was performed using the standard protocol during bolus administration of intravenous contrast. Multiplanar CT image reconstructions and MIPs were obtained to evaluate the vascular anatomy. RADIATION DOSE REDUCTION: This exam was performed according to the departmental dose-optimization program which includes automated exposure control, adjustment of the mA and/or kV according to patient size and/or use of iterative reconstruction technique. CONTRAST:  65mL OMNIPAQUE IOHEXOL 350 MG/ML SOLN COMPARISON:  Chest radiograph dated 05/11/2023, CT chest dated 10/06/2022 FINDINGS: Cardiovascular: The study is high quality for the evaluation of pulmonary embolism. Filling defect within the right middle lobar arteries. Additional filling defects within right lower lobe subsegmental arteries (6:180, 197). Great vessels are normal in course and caliber. Normal heart size. No significant pericardial fluid/thickening. RV to LV ratio is less than 1. Mediastinum/Nodes: Imaged thyroid gland without nodules meeting criteria for imaging follow-up by size. Normal esophagus. No pathologically enlarged axillary, supraclavicular, mediastinal, or hilar lymph nodes. Lungs/Pleura: The central airways are patent. Linear secretions within the right bronchus intermedius. Unchanged irregular nodule in the right apex measuring 8 x 5 mm (11:16). Right  apical scarring. Triangular ground-glass density along the medial right middle lobe. Bilateral lower lobe subsegmental and relaxation atelectasis. No pneumothorax. Small right pleural effusion, including a loculated component. Upper abdomen: Normal. Musculoskeletal: No acute or abnormal lytic or blastic osseous lesions. Review of the MIP images confirms the above findings. IMPRESSION: 1. Pulmonary emboli within the right middle lobar and lower lobe subsegmental arteries. No evidence of right heart strain. 2. Triangular ground-glass density along the medial right middle lobe, which may represent pulmonary infarct. 3. Small right pleural effusion, including a loculated component. 4. Unchanged irregular nodule in the right apex measuring 8 x 5 mm. Critical Value/emergent results were called by telephone at the time of interpretation on 05/11/2023 at 4:47 pm to provider Dr. Anitra Lauth, Who verbally acknowledged these results. Electronically Signed   By: Agustin Cree M.D.   On: 05/11/2023 16:47   DG Chest 2 View  Result Date: 05/11/2023 CLINICAL DATA:  Chest pain for 3 days. EXAM: CHEST - 2 VIEW COMPARISON:  February 14, 2019. FINDINGS: The heart size and mediastinal contours are within normal limits. Hypoinflation of the lungs is noted with mild bibasilar subsegmental atelectasis or infiltrates. The visualized skeletal structures are unremarkable. IMPRESSION: Hypoinflation of the lungs with mild bibasilar subsegmental atelectasis or infiltrates. Electronically Signed   By: Zenda Alpers.D.  On: 05/11/2023 12:29      Subjective: Patient seen and examined at bedside.  Feels much better and feels okay to go home today.  No fever, vomiting, abdominal pain reported.  Discharge Exam: Vitals:   05/13/23 0430 05/13/23 0829  BP: 108/64 125/79  Pulse: (!) 105 (!) 114  Resp: 16 19  Temp: 98.2 F (36.8 C) 98.2 F (36.8 C)  SpO2: 100% 97%    General: Pt is alert, awake, not in acute distress.  On room air.  Looks  chronically ill and deconditioned. Cardiovascular: rate controlled, S1/S2 + Respiratory: bilateral decreased breath sounds at bases Abdominal: Soft, NT, ND, bowel sounds + Extremities: Mild lower extremity edema present; no cyanosis    The results of significant diagnostics from this hospitalization (including imaging, microbiology, ancillary and laboratory) are listed below for reference.     Microbiology: No results found for this or any previous visit (from the past 240 hour(s)).   Labs: BNP (last 3 results) No results for input(s): "BNP" in the last 8760 hours. Basic Metabolic Panel: Recent Labs  Lab 05/11/23 1123 05/11/23 1553 05/12/23 0029 05/12/23 0947  NA 138 139  --  138  K 3.1* 3.2*  --  4.4  CL 108 109  --  110  CO2 21*  --   --  18*  GLUCOSE 97 83  --  203*  BUN 8 5*  --  14  CREATININE 0.79 0.60  --  0.84  CALCIUM 8.8*  --   --  8.7*  MG  --   --  1.9 1.9  PHOS  --   --   --  1.4*   Liver Function Tests: Recent Labs  Lab 05/12/23 0947  ALBUMIN 2.7*   No results for input(s): "LIPASE", "AMYLASE" in the last 168 hours. No results for input(s): "AMMONIA" in the last 168 hours. CBC: Recent Labs  Lab 05/11/23 1123 05/11/23 1553 05/12/23 0947  WBC 13.1*  --  14.1*  HGB 12.9 14.3 11.7*  HCT 38.1 42.0 34.3*  MCV 94.1  --  94.2  PLT 243  --  226   Cardiac Enzymes: No results for input(s): "CKTOTAL", "CKMB", "CKMBINDEX", "TROPONINI" in the last 168 hours. BNP: Invalid input(s): "POCBNP" CBG: No results for input(s): "GLUCAP" in the last 168 hours. D-Dimer No results for input(s): "DDIMER" in the last 72 hours. Hgb A1c No results for input(s): "HGBA1C" in the last 72 hours. Lipid Profile No results for input(s): "CHOL", "HDL", "LDLCALC", "TRIG", "CHOLHDL", "LDLDIRECT" in the last 72 hours. Thyroid function studies No results for input(s): "TSH", "T4TOTAL", "T3FREE", "THYROIDAB" in the last 72 hours.  Invalid input(s): "FREET3" Anemia work  up No results for input(s): "VITAMINB12", "FOLATE", "FERRITIN", "TIBC", "IRON", "RETICCTPCT" in the last 72 hours. Urinalysis    Component Value Date/Time   COLORURINE YELLOW 09/08/2022 1048   APPEARANCEUR HAZY (A) 09/08/2022 1048   LABSPEC 1.013 09/08/2022 1048   PHURINE 6.0 09/08/2022 1048   GLUCOSEU NEGATIVE 09/08/2022 1048   HGBUR NEGATIVE 09/08/2022 1048   BILIRUBINUR NEGATIVE 09/08/2022 1048   BILIRUBINUR neg 09/09/2015 1435   KETONESUR 5 (A) 09/08/2022 1048   PROTEINUR 30 (A) 09/08/2022 1048   UROBILINOGEN 0.2 03/13/2018 1201   NITRITE NEGATIVE 09/08/2022 1048   LEUKOCYTESUR NEGATIVE 09/08/2022 1048   Sepsis Labs Recent Labs  Lab 05/11/23 1123 05/12/23 0947  WBC 13.1* 14.1*   Microbiology No results found for this or any previous visit (from the past 240 hour(s)).   Time coordinating discharge:  35 minutes  SIGNED:   Glade Lloyd, MD  [facility] 05/13/2023, 9:26 AM

## 2023-05-13 NOTE — Progress Notes (Signed)
DISCHARGE NOTE HOME Emily Cameron to be discharged Home per MD order. Discussed prescriptions and follow up appointments with the patient. Prescriptions given to patient; medication list explained in detail. Patient verbalized understanding.  Skin clean, dry and intact without evidence of skin break down, no evidence of skin tears noted. IV catheter discontinued intact. Site without signs and symptoms of complications. Dressing and pressure applied. Pt denies pain at the site currently. No complaints noted.  Patient free of lines, drains, and wounds.   An After Visit Summary (AVS) was printed and given to the patient. Patient escorted via wheelchair, and discharged home via private auto. Pick up by husband Tamala Fothergill, RN

## 2023-05-13 NOTE — TOC Transition Note (Signed)
Transition of Care [facility]) - CM/SW Discharge Note   Patient Details  Name: Emily Cameron MRN: [id number] Date of Birth: 1961/06/24  Transition of Care [facility]) CM/SW Contact:  Tom-Heskett, Hershal Coria, RN Phone Number: 05/13/2023, 11:26 AM   Clinical Narrative:     Patient is scheduled for discharge today.  Readmission Risk Assessment done. Hospital f/u and discharge instructions on AVS. Prescriptions sent to [facility] pharmacy and meds will be delivered to patient at bedside prior discharge. No TOC needs or recommendations noted. Husband, [name]  to transport at discharge.  No further TOC needs noted.                  Final next level of care: Home/Self Care Barriers to Discharge: Barriers Resolved   Patient Goals and CMS Choice CMS Medicare.gov Compare Post Acute Care list provided to:: Patient Choice offered to / list presented to : NA  Discharge Placement                  Patient to be transferred to facility by: Husband Name of family member notified: [name]    Discharge Plan and Services Additional resources added to the After Visit Summary for                  DME Arranged: N/A DME Agency: NA       HH Arranged: NA HH Agency: NA        Social Determinants of Health (SDOH) Interventions SDOH Screenings   Food Insecurity: No Food Insecurity (10/24/2022)  Housing: Low Risk  (10/24/2022)  Transportation Needs: No Transportation Needs (10/24/2022)  Utilities: Not At Risk (10/24/2022)  Tobacco Use: High Risk (05/11/2023)     Readmission Risk Interventions    05/13/2023    9:50 AM  Readmission Risk Prevention Plan  Transportation Screening Complete  PCP or Specialist Appt within 3-5 Days Complete  HRI or Home Care Consult Complete  Social Work Consult for Recovery Care Planning/Counseling Complete  Palliative Care Screening Not Applicable

## 2023-05-16 ENCOUNTER — Telehealth: Payer: Self-pay

## 2023-05-16 DIAGNOSIS — I2699 Other pulmonary embolism without acute cor pulmonale: Secondary | ICD-10-CM

## 2023-05-17 ENCOUNTER — Telehealth: Payer: Self-pay

## 2023-05-17 NOTE — Patient Outreach (Signed)
Care Management  Transitions of Care Program Managed Medicaid Transitions of Care Week 1 completion of assessment  05/17/2023 Name: Emily Cameron MRN: [id number] DOB: 07-25-1961  Subjective: Emily Cameron is a 62 y.o. year old female who is a primary care patient of Courtney Paris, NP. The Care Management team was unable to reach the patient by phone to assess and address transitions of care needs.   Plan: Additional outreach attempts will be made to reach the patient enrolled in the [facility] Program (Post Inpatient/ED Visit).  Returning phone call regarding being told by PCP's office that her MM insurance had expired - verified via [place] [employer] that pt's [employer] is ACTIVE as of 04/24/23. Also tried to inform Pt's PCP practice [facility]) but was routed to Billing by receptionist and had to leave a message regarding patient's ACTIVE MM coverage.  Alyse Low, RN, BA, [facility], CRRN [facility] Coordinator, Transition of Care Ph # [phone]

## 2023-05-18 ENCOUNTER — Telehealth: Payer: Self-pay

## 2023-05-18 ENCOUNTER — Other Ambulatory Visit: Payer: Medicaid Other

## 2023-05-18 NOTE — Patient Instructions (Signed)
Visit Information  Thank you for taking time to visit with me today. I'm pleased to let you know that I verified through [place] [employer] that your insurance with [employer] [phone]) IS active as of 9/1/ 2024, so you should not have a problem using your insurance at your upcoming PCP post-hospitalization visit. Please don't hesitate to contact me if I can be of assistance to you before our next scheduled telephone appointment.  Our next appointment is by telephone on Tuesday 10/8 at 2pm  Following is a copy of your care plan:   Goals Addressed             This Visit's Progress    Patient Stated        Care Management   Visit Note  05/18/2023 Name: Emily Cameron MRN: [id number] DOB: 1960-11-26  Subjective: Emily Cameron is a 62 y.o. year old female who is a primary care patient of Courtney Paris, NP. The Care Management team was consulted for assistance.      Engaged with patient spoke with patient by telephone.   Assessment:   Interventions: Evaluation of current treatment plan related to recent hospitalization @ [facility] 9/18 thru 9/20 for treatment of Pulmonary Embolism, and patient's adherence to plan as established by provider. Provided education to patient re: newly started medication Apixaban (also known as Eliquis), explained medicine is an anticoagulant that makes patient's blood flow through her veins more easily, which means her blood is less likely to make blood clots. Explained a blood clot which blocks a blood vessel is called an embolism. Reviewed medications with patient and discussed importance of following the starter pack directions which directs the patient to take 10 mg of Apixaban orally twice daily for the first 7 days, and then to take 5mg  of Apixaban orally twice daily from day 8 onward.  Recommendations:     Consent to Services:  Patient was given information about Managed Medicaid Care Management services, agreed to services, and  gave verbal consent to participate.   Plan: The patient has been provided with contact information for the care management team and has been advised to call with any health related questions or concerns.   Alyse Low, RN, BA, [facility], CRRN [facility] Population Health Care Management Coordinator, Transition of Care Ph # [phone]                Patient verbalizes understanding of instructions and care plan provided today and agrees to view in MyChart. Active MyChart status and patient understanding of how to access instructions and care plan via MyChart confirmed with patient.     The patient has been provided with contact information for the care management team and has been advised to call with any health related questions or concerns.   Please call the care guide team at [phone] if you need to cancel or reschedule your appointment.   Please call 1-800-273-TALK (toll free, 24 hour hotline) if you are experiencing a Mental Health or Behavioral Health Crisis or need someone to talk to.  Alyse Low, RN, BA, [facility], CRRN [facility] Coordinator, Transition of Care Ph # [phone]

## 2023-05-18 NOTE — Patient Outreach (Signed)
Care Management  Transitions of Care Program Managed Medicaid Transitions of Care VBCI TOC Week 1   05/18/2023 Name: Emily Cameron MRN: [id number] DOB: [birth year]  Subjective: Emily Cameron is a 62 y.o. year old female who is a primary care patient of Courtney Paris, NP. The Care Management team Engaged with patient Engaged with patient by telephone to assess and address transitions of care needs.   Consent to Services:  Patient was given information about Managed Medicaid Care Management services, agreed to services, and gave verbal consent to participate.   Assessment:           SDOH Interventions    Flowsheet Row Telephone from 05/18/2023 in [place] POPULATION HEALTH DEPARTMENT ED to Hosp-Admission (Discharged) from 05/11/2023 in [facility] 93M KIDNEY UNIT Admission (Discharged) from 10/22/2022 in [facility] [place] [facility] Office Visit from 05/08/2015 in [facility]  SDOH Interventions      Food Insecurity Interventions AMB Referral, Other (Comment)  [referral to [employer] BSW to discuss possible untapped resources, currenty not receiving food stamps] -- Inpatient TOC --  Housing Interventions Intervention Not Indicated -- Inpatient TOC --  Transportation Interventions -- Inpatient TOC, Intervention Not Indicated, Patient Resources (Friends/Family) Inpatient TOC --  Utilities Interventions AMB Referral  [9/25 conversation w/ RNCM revealed that pt's daughte is going to pay her light bill today for her] -- Inpatient TOC --  Depression Interventions/Treatment  -- -- -- Counseling  [[name] to call patient, [name] is not available at this time.]        Goals Addressed             This Visit's Progress    Patient Stated        Care Management   Visit Note  05/18/2023 Name: Emily Cameron MRN: [id number] DOB: November 28, 1960  Subjective: Emily Cameron is a 62 y.o. year old female who is a primary care patient of Courtney Paris, NP. The Care Management team was consulted for assistance.      Engaged with patient spoke with patient by telephone.   Assessment:   Interventions: Evaluation of current treatment plan related to recent hospitalization @ [facility] 9/18 thru 9/20 for treatment of Pulmonary Embolism, and patient's adherence to plan as established by provider. Provided education to patient re: newly started medication Apixaban (also known as Eliquis), explained medicine is an anticoagulant that makes patient's blood flow through her veins more easily, which means her blood is less likely to make blood clots. Explained a blood clot which blocks a blood vessel is called an embolism. Reviewed medications with patient and discussed importance of following the starter pack directions which directs the patient to take 10 mg of Apixaban orally twice daily for the first 7 days, and then to take 5mg  of Apixaban orally twice daily from day 8 onward.  Recommendations:     Consent to Services:  Patient was given information about Managed Medicaid Care Management services, agreed to services, and gave verbal consent to participate.   Plan: The patient has been provided with contact information for the care management team and has been advised to call with any health related questions or concerns.   Alyse Low, RN, BA, [facility], CRRN [facility] Coordinator, Transition of Care Ph # [phone]                Plan: The patient has been provided with contact information  for the care management team and has been advised to call with any health related questions or concerns.   Alyse Low, RN, BA, [facility], CRRN [facility] Coordinator, Transition of Care Ph # [phone]

## 2023-05-20 NOTE — Telephone Encounter (Signed)
Referral to [employer] re obtaining food stamps (no longer receives, believes she re-qualifies and needs to reapply) and challenges paying utilities bill

## 2023-05-25 ENCOUNTER — Other Ambulatory Visit: Payer: Medicaid Other

## 2023-05-25 NOTE — Patient Outreach (Signed)
Medicaid Managed Care Social Work Note  05/25/2023 Name:  Emily Cameron MRN:  [id number] DOB:  September 18, 1960  Emily Cameron is an 62 y.o. year old female who is a primary patient of Courtney Paris, NP.  The Medicaid Managed Care Coordination team was consulted for assistance with:  Community Resources    Ms. Mount was given information about Medicaid Managed Care Coordination team services today. Emily Cameron Patient agreed to services and verbal consent obtained.  Engaged with patient  for by telephone forinitial visit in response to referral for case management and/or care coordination services.   Assessments/Interventions:  Review of past medical history, allergies, medications, health status, including review of consultants reports, laboratory and other test data, was performed as part of comprehensive evaluation and provision of chronic care management services.  SDOH: (Social Determinant of Health) assessments and interventions performed: SDOH Interventions    Flowsheet Row Telephone from 05/18/2023 in [place] POPULATION HEALTH DEPARTMENT ED to Hosp-Admission (Discharged) from 05/11/2023 in [facility] 31M KIDNEY UNIT Admission (Discharged) from 10/22/2022 in [facility] [place] [facility] Office Visit from 05/08/2015 in [facility]  SDOH Interventions      Food Insecurity Interventions AMB Referral, Other (Comment)  [referral to [employer] BSW to discuss possible untapped resources, currenty not receiving food stamps] -- Inpatient TOC --  Housing Interventions Intervention Not Indicated -- Inpatient TOC --  Transportation Interventions -- Inpatient TOC, Intervention Not Indicated, Patient Resources (Friends/Family) Inpatient TOC --  Utilities Interventions AMB Referral  [9/25 conversation w/ RNCM revealed that pt's daughte is going to pay her light bill today for her] -- Inpatient TOC --  Depression Interventions/Treatment  -- -- -- Counseling   [Emily Cameron to call patient, Emily Cameron is not available at this time.]     BSW completed a telephone outreach with patient, she states she is living with her daughter on the couch. [place] [address] and just found an apartment but needs assistance with the deposits, BSW will mail patient resources for assistance with deposits. Patient stated she did speak with someone from DSS and Emily Cameron is still showing up as her insurance, patient states Emily Cameron will be in effect for 30 days.  [facility] isNo other resources are needed at this time.  Advanced Directives Status:  Not addressed in this encounter.  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 10/22/2022   Cancer of right colon (HCC) 10/05/2022   History of DVT (deep vein thrombosis) 10/05/2022   Unintentional weight loss 10/05/2022   Chronic pain syndrome 05/08/2015   Breast cancer screening 07/04/2014   Blurry vision, bilateral 09/24/2013   Hoarseness of voice 09/24/2013   MDD (major depressive disorder) 05/01/2013   Hypoglycemia 05/08/2012   Hypotension 05/03/2012   COPD (chronic obstructive pulmonary disease) (HCC) 03/31/2012   Sleep apnea 09/06/2011   Chronic constipation 09/06/2011   Anxiety 09/06/2011   Vitamin D deficiency 09/06/2011   Chest pain 05/03/2011   Reflux 05/03/2011   Tobacco abuse 03/19/2011   Pulmonary nodule  03/05/2011   HYPERLIPIDEMIA 12/12/2008   SHOULDER PAIN, RIGHT 05/27/2008   ALLERGIC RHINITIS DUE TO POLLEN 04/15/2008   Other depressive episodes 06/29/2007   Adjustment insomnia 06/29/2007   Loss of appetite 06/29/2007    Conditions to be  addressed/monitored per PCP order:   community resources  There are no care plans that you recently modified to display for this patient.   Follow up:  Patient agrees to Care Plan and Follow-up.  Plan: The Managed Medicaid care management team will reach out to the patient again over the next 30 days.  Date/time of next scheduled Social Work care management/care coordination outreach:  06/27/23 Emily Cameron, Emily Cameron, [facility]  Managed [facility] Social Worker [phone]

## 2023-05-25 NOTE — Patient Instructions (Signed)
Visit Information  Emily Cameron was given information about Medicaid Managed Care team care coordination services as a part of their [facility] Community Plan Medicaid benefit. [name] verbally consented to engagement with the [facility] Managed Care team.   If you are experiencing a medical emergency, please call 911 or report to your local emergency department or urgent care.   If you have a non-emergency medical problem during routine business hours, please contact your provider's office and ask to speak with a nurse.   For questions related to your [facility], please call: [phone] or visit the homepage here: [email or web address]  If you would like to schedule transportation through your [facility], please call the following number at least 2 days in advance of your appointment: [phone]   Rides for urgent appointments can also be made after hours by calling Member Services.  Call the Behavioral Health Crisis Line at [phone], at any time, 24 hours a day, 7 days a week. If you are in danger or need immediate medical attention call 911.  If you would like help to quit smoking, call 1-800-QUIT-NOW (847-512-8540) OR Espaol: 1-855-Djelo-Ya (1-324-401-0272) o para ms informacin haga clic aqu or Text READY to 536-644 to register via text  [name] - following are the goals we discussed in your visit today:   Goals Addressed   None      Social Worker will follow up in 30 days.   Gus Puma, Kenard Gower, MHA [facility]  Managed Medicaid Social Worker [phone]   Following is a copy of your plan of care:  There are no care plans that you recently modified to display for this patient.

## 2023-05-31 ENCOUNTER — Telehealth: Payer: Self-pay

## 2023-05-31 ENCOUNTER — Other Ambulatory Visit: Payer: Medicaid Other

## 2023-05-31 DIAGNOSIS — Z79899 Other long term (current) drug therapy: Secondary | ICD-10-CM | POA: Diagnosis not present

## 2023-05-31 NOTE — Patient Instructions (Signed)
Visit Information  Thank you for taking time to visit with me today. Please don't hesitate to contact me if I can be of assistance to you before our next scheduled telephone appointment.  Our next appointment is by telephone on 06/07/23 at 2pm  Following is a copy of your care plan:   Goals Addressed               This Visit's Progress     Patient Stated (pt-stated)        Current Barriers:  Knowledge Deficits related to plan of care for management of Anticoagulation therapy (Eliquis)    RNCM Clinical Goal(s):  Patient will verbalize understanding of plan for management of prevention of Pulmonary embolism/DVT through the use of oral anticoagulation therapy (Eliquis, also known as apixaban) as evidenced by verbalization of importance of not missing any doses and verbalizing importance of implementing bleeding precautions  through collaboration with RN Care manager, provider, and care team.   Interventions: Evaluation of current treatment plan related to  self management and patient's adherence to plan as established by provider  Transitions of Care:  New goal. Evaluation of current treatment plan related to recent hospitalization @ [facility] 9/18 thru 9/20 for treatment of Pulmonary Embolism, and patient's adherence to plan as established by provider. Provided education to patient re: newly started medication Eliquis (also known as apixaban), explained medicine is an anticoagulant that makes patient's blood flow through her veins more easily, which means her blood is less likely to make blood clots. Explained a blood clot which blocks a blood vessel is called an embolism. Reviewed medication dosing with patient and discussed importance of following the starter pack directions which directs the patient to take 10 mg of Apixaban orally twice daily for the first 7 days, and then to take 5mg  of Apixaban orally twice daily from day 8 onward. Reviewed importance of implementing  bleeding precautions, fall prevention, and reporting any falls especially involving the head. Reviewed signs and symptoms of bleeding including noting blood in urine/stool, development of hematomas, and nose bleeds  Patient Goals/Self-Care Activities: Participate in Transition of Care Program/Attend TOC scheduled calls Take all medications as prescribed Attend all scheduled provider appointments Call pharmacy for medication refills 3-7 days in advance of running out of medications Call provider office for new concerns or questions   Follow Up Plan:  The patient has been provided with contact information for the care management team and has been advised to call with any health related questions or concerns.          The patient verbalized understanding of instructions, educational materials, and care plan provided today and agreed to receive a mailed copy of patient instructions, educational materials, and care plan.   The patient has been provided with contact information for the care management team and has been advised to call with any health related questions or concerns.   Please call the care guide team at [phone] if you need to cancel or reschedule your appointment.   Please call 1-800-273-TALK (toll free, 24 hour hotline) if you are experiencing a Mental Health or Behavioral Health Crisis or need someone to talk to.  Alyse Low, RN, BA, [facility], CRRN [facility] Coordinator, Transition of Care Ph # [phone]

## 2023-05-31 NOTE — Patient Outreach (Signed)
Care Management  Transitions of Care Program Managed Medicaid Transitions of Care week 2   05/31/2023 Name: Emily Cameron MRN: [id number] DOB: 1960-09-21  Subjective: Emily Cameron is a 62 y.o. year old female who is a primary care patient of Courtney Paris, NP. The Care Management team Engaged with patient Engaged with patient by telephone to assess and address transitions of care needs.   Consent to Services:  Patient was given information about Managed Medicaid Care Management services, agreed to services, and gave verbal consent to participate.   Assessment:           SDOH Interventions    Flowsheet Row Telephone from 05/18/2023 in [place] POPULATION HEALTH DEPARTMENT ED to Hosp-Admission (Discharged) from 05/11/2023 in [facility] 74M KIDNEY UNIT Admission (Discharged) from 10/22/2022 in [facility] General Surgery Office Visit from 05/08/2015 in [facility]  SDOH Interventions      Food Insecurity Interventions AMB Referral, Other (Comment)  [referral to [employer] B[employer] to discuss possible untapped resources, currenty not receiving food stamps] -- Inpatient TOC --  Housing Interventions Intervention Not Indicated -- Inpatient TOC --  Transportation Interventions -- Inpatient TOC, Intervention Not Indicated, Patient Resources (Friends/Family) Inpatient TOC --  Utilities Interventions AMB Referral  [9/25 conversation w/ RNCM revealed that pt's daughte is going to pay her light bill today for her] -- Inpatient TOC --  Depression Interventions/Treatment  -- -- -- Counseling  [[name] to call patient, [name] is not available at this time.]        Goals Addressed               This Visit's Progress     Patient Stated (pt-stated)        Current Barriers:  Knowledge Deficits related to plan of care for management of Anticoagulation therapy (Eliquis)    RNCM Clinical Goal(s):  Patient will verbalize understanding of plan for  management of prevention of Pulmonary embolism/DVT through the use of oral anticoagulation therapy (Eliquis, also known as apixaban) as evidenced by verbalization of importance of not missing any doses and verbalizing importance of implementing bleeding precautions  through collaboration with RN Care manager, provider, and care team.   Interventions: Evaluation of current treatment plan related to  self management and patient's adherence to plan as established by provider  Transitions of Care:  New goal. Evaluation of current treatment plan related to recent hospitalization @ [facility] 9/18 thru 9/20 for treatment of Pulmonary Embolism, and patient's adherence to plan as established by provider. Provided education to patient re: newly started medication Eliquis (also known as apixaban), explained medicine is an anticoagulant that makes patient's blood flow through her veins more easily, which means her blood is less likely to make blood clots. Explained a blood clot which blocks a blood vessel is called an embolism. Reviewed medication dosing with patient and discussed importance of following the starter pack directions which directs the patient to take 10 mg of Apixaban orally twice daily for the first 7 days, and then to take 5mg  of Apixaban orally twice daily from day 8 onward. Reviewed importance of implementing bleeding precautions, fall prevention, and reporting any falls especially involving the head. Reviewed signs and symptoms of bleeding including noting blood in urine/stool, development of hematomas, and nose bleeds  Patient Goals/Self-Care Activities: Participate in Transition of Care Program/Attend TOC scheduled calls Take all medications as prescribed Attend all scheduled provider appointments Call pharmacy for medication  refills 3-7 days in advance of running out of medications Call provider office for new concerns or questions   Follow Up Plan:  The patient has been  provided with contact information for the care management team and has been advised to call with any health related questions or concerns.          Plan: The patient has been provided with contact information for the care management team and has been advised to call with any health related questions or concerns.   Alyse Low, RN, BA, [facility], CRRN [facility] Coordinator, Transition of Care Ph # [phone]

## 2023-06-03 DIAGNOSIS — Z79899 Other long term (current) drug therapy: Secondary | ICD-10-CM | POA: Diagnosis not present

## 2023-06-06 DIAGNOSIS — M79604 Pain in right leg: Secondary | ICD-10-CM | POA: Diagnosis not present

## 2023-06-07 ENCOUNTER — Other Ambulatory Visit: Payer: Medicaid Other

## 2023-06-07 ENCOUNTER — Telehealth: Payer: Self-pay

## 2023-06-07 ENCOUNTER — Encounter (HOSPITAL_COMMUNITY): Payer: Self-pay | Admitting: Licensed Clinical Social Worker

## 2023-06-07 ENCOUNTER — Telehealth (HOSPITAL_COMMUNITY): Payer: Self-pay

## 2023-06-07 NOTE — Telephone Encounter (Signed)
(  Con't)This therapist calls [employer] and her VM has not been set up. Will try later.  Remigio Eisenmenger, MS, LMFT, LCAS 06-07-23

## 2023-06-07 NOTE — Patient Outreach (Signed)
Care Management  Transitions of Care Program Managed Medicaid Transitions of Care week 3  06/07/2023 Name: [name] MRN: [id number] DOB: 1960/09/13  Subjective: Emily Cameron is a 62 y.o. year old female who is a primary care patient of Courtney Paris, NP. The Care Management team was unable to reach the patient by phone to assess and address transitions of care needs.   Plan: Additional outreach attempts will be made to reach the patient enrolled in the [facility] Program (Post Inpatient/ED Visit).  Next Transition of Care appointment is scheduled for 10/22 at 2pm.  Alyse Low, RN, BA, [facility], CRRN [facility] Population Health Care Management Coordinator, Transition of Care Ph # [phone]

## 2023-06-14 ENCOUNTER — Telehealth: Payer: Self-pay

## 2023-06-14 ENCOUNTER — Other Ambulatory Visit: Payer: Self-pay

## 2023-06-14 NOTE — Patient Outreach (Signed)
Care Management  Transitions of Care Program Managed Medicaid Transitions of Care week 4  06/14/2023 Name: [name] MRN: [id number] DOB: 1961/06/05  Subjective: Emily Cameron is a 62 y.o. year old female who is a primary care patient of Courtney Paris, NP. The Care Management team was unable to reach the patient by phone to assess and address transitions of care needs.   Plan: Additional outreach attempts will be made to reach the patient enrolled in the [facility] Program (Post Inpatient/ED Visit).  Alyse Low, RN, BA, [facility], CRRN [facility] Coordinator, Transition of Care Ph # [phone]

## 2023-06-15 ENCOUNTER — Other Ambulatory Visit: Payer: Self-pay

## 2023-06-15 ENCOUNTER — Telehealth: Payer: Self-pay

## 2023-06-15 NOTE — Patient Outreach (Signed)
Care Management  Transitions of Care Program Managed Medicaid Transitions of Care week 4   06/15/2023 Name: Emily Cameron MRN: [id number] DOB: [birth year]  Subjective: Emily Cameron is a 62 y.o. year old female who is a primary care patient of Courtney Paris, NP. The Care Management team Engaged with patient Engaged with patient by telephone to assess and address transitions of care needs.   Consent to Services:  Patient was given information about Managed Medicaid Care Management services, agreed to services, and gave verbal consent to participate.   Assessment:           SDOH Interventions    Flowsheet Row Telephone from 05/18/2023 in [place] POPULATION HEALTH DEPARTMENT ED to Hosp-Admission (Discharged) from 05/11/2023 in [facility]M KIDNEY UNIT Admission (Discharged) from 10/22/2022 in [facility] General Surgery Office Visit from 05/08/2015 in [facility]  SDOH Interventions      Food Insecurity Interventions AMB Referral, Other (Comment)  [referral to [employer] to discuss possible untapped resources, currenty not receiving food stamps] -- Inpatient TOC --  Housing Interventions Intervention Not Indicated -- Inpatient TOC --  Transportation Interventions -- Inpatient TOC, Intervention Not Indicated, Patient Resources (Friends/Family) Inpatient TOC --  Utilities Interventions AMB Referral  [9/25 conversation w/ RNCM revealed that pt's daughte is going to pay her light bill today for her] -- Inpatient TOC --  Depression Interventions/Treatment  -- -- -- Counseling  [[name] to call patient, [name] is not available at this time.]        Goals Addressed               This Visit's Progress     COMPLETED: Patient Stated (pt-stated)        Current Barriers:  Knowledge Deficits related to plan of care for management of Anticoagulation therapy (Eliquis)    RNCM Clinical Goal(s):  Patient will verbalize understanding  of plan for management of prevention of Pulmonary embolism/DVT through the use of oral anticoagulation therapy (Eliquis, also known as apixaban) as evidenced by verbalization of importance of not missing any doses and verbalizing importance of implementing bleeding precautions  through collaboration with RN Care manager, provider, and care team.   Interventions: Evaluation of current treatment plan related to  self management and patient's adherence to plan as established by provider  Transitions of Care:  Completed 30d TOC program goal Evaluation of current treatment plan related to recent hospitalization @ [facility] 9/18 thru 9/20 for treatment of Pulmonary Embolism, and patient's adherence to plan as established by provider. Provided education to patient re: newly started medication Eliquis (also known as apixaban), explained medicine is an anticoagulant that makes patient's blood flow through her veins more easily, which means her blood is less likely to make blood clots. Explained a blood clot which blocks a blood vessel is called an embolism. Reviewed medication dosing with patient and discussed importance of following the starter pack directions which directs the patient to take 10 mg of Apixaban orally twice daily for the first 7 days, and then to take 5mg  of Apixaban orally twice daily from day 8 onward. Reviewed importance of implementing bleeding precautions, fall prevention, and reporting any falls especially involving the head. Reviewed signs and symptoms of bleeding including noting blood in urine/stool, development of hematomas, and nose bleeds  Patient Goals/Self-Care Activities: Participate in Transition of Care Program/Attend TOC scheduled calls Take all medications as prescribed Attend all scheduled provider appointments  Call pharmacy for medication refills 3-7 days in advance of running out of medications Call provider office for new concerns or questions   Follow Up  Plan:  The patient has been provided with contact information for the care management team and has been advised to call with any health related questions or concerns.          Plan: The patient has been provided with contact information for the care management team and has been advised to call with any health related questions or concerns.   Alyse Low, RN, BA, [facility], CRRN [facility] Coordinator, Transition of Care Ph # [phone]

## 2023-06-15 NOTE — Patient Instructions (Signed)
Visit Information  Dear Samson Frederic,  Thank you for taking time to visit with me today. It has been a pleasure to get to know you and work with you after your recent hospitalization at [facility] (9/18 to 9/20) for a diagnosed pulmonary embolism. Today was our last telephone visit reviewing challenges you have faced since discharge as well as your successes.   I consider your commitment to taking all meds as prescribed, and understanding the importance of the anticoagulation actions of your new prescribed med, Eliquis (apixaban) as a MAJOR success in helping to prevent Pulmonary Embolisms and Deep Vein Thrombosis in your future as well as helping you stay out of the hospital. And you have successfully cut way back on your smoking.  Please don't hesitate to contact me if I can be of assistance to you before our next scheduled telephone appointment.  Warm Regards,  Elnita Maxwell   Following is a copy of your care plan:   Goals Addressed               This Visit's Progress     COMPLETED: Patient Stated (pt-stated)        Current Barriers:  Knowledge Deficits related to plan of care for management of Anticoagulation therapy (Eliquis)    RNCM Clinical Goal(s):  Patient will verbalize understanding of plan for management of prevention of Pulmonary embolism/DVT through the use of oral anticoagulation therapy (Eliquis, also known as apixaban) as evidenced by verbalization of importance of not missing any doses and verbalizing importance of implementing bleeding precautions  through collaboration with RN Care manager, provider, and care team.   Interventions: Evaluation of current treatment plan related to  self management and patient's adherence to plan as established by provider  Transitions of Care:  Completed 30d TOC program goal Evaluation of current treatment plan related to recent hospitalization @ [facility] 9/18 thru 9/20 for treatment of Pulmonary Embolism, and patient's  adherence to plan as established by provider. Provided education to patient re: newly started medication Eliquis (also known as apixaban), explained medicine is an anticoagulant that makes patient's blood flow through her veins more easily, which means her blood is less likely to make blood clots. Explained a blood clot which blocks a blood vessel is called an embolism. Reviewed medication dosing with patient and discussed importance of following the starter pack directions which directs the patient to take 10 mg of Apixaban orally twice daily for the first 7 days, and then to take 5mg  of Apixaban orally twice daily from day 8 onward. Reviewed importance of implementing bleeding precautions, fall prevention, and reporting any falls especially involving the head. Reviewed signs and symptoms of bleeding including noting blood in urine/stool, development of hematomas, and nose bleeds  Patient Goals/Self-Care Activities: Participate in Transition of Care Program/Attend TOC scheduled calls Take all medications as prescribed Attend all scheduled provider appointments Call pharmacy for medication refills 3-7 days in advance of running out of medications Call provider office for new concerns or questions   Follow Up Plan:  The patient has been provided with contact information for the care management team and has been advised to call with any health related questions or concerns.          Patient verbalizes understanding of instructions and care plan provided today and agrees to view in MyChart. Active MyChart status and patient understanding of how to access instructions and care plan via MyChart confirmed with patient.     The patient has been  provided with contact information for the care management team and has been advised to call with any health related questions or concerns.   Please call the care guide team at [phone] if you need to cancel or reschedule your appointment.   Please call  1-800-273-TALK (toll free, 24 hour hotline) if you are experiencing a Mental Health or Behavioral Health Crisis or need someone to talk to.  Alyse Low, RN, BA, [facility], CRRN [facility] Coordinator, Transition of Care Ph # [phone]

## 2023-06-17 DIAGNOSIS — R059 Cough, unspecified: Secondary | ICD-10-CM | POA: Diagnosis not present

## 2023-06-17 DIAGNOSIS — Z86711 Personal history of pulmonary embolism: Secondary | ICD-10-CM | POA: Diagnosis not present

## 2023-06-17 DIAGNOSIS — R350 Frequency of micturition: Secondary | ICD-10-CM | POA: Diagnosis not present

## 2023-06-17 DIAGNOSIS — N390 Urinary tract infection, site not specified: Secondary | ICD-10-CM | POA: Diagnosis not present

## 2023-06-17 DIAGNOSIS — I779 Disorder of arteries and arterioles, unspecified: Secondary | ICD-10-CM | POA: Diagnosis not present

## 2023-06-17 DIAGNOSIS — Z6822 Body mass index (BMI) 22.0-22.9, adult: Secondary | ICD-10-CM | POA: Diagnosis not present

## 2023-06-17 DIAGNOSIS — A499 Bacterial infection, unspecified: Secondary | ICD-10-CM | POA: Diagnosis not present

## 2023-06-27 ENCOUNTER — Other Ambulatory Visit: Payer: Self-pay

## 2023-06-27 NOTE — Patient Instructions (Signed)
  Medicaid Managed Care   Unsuccessful Outreach Note  06/27/2023 Name: Emily Cameron MRN: [id number] DOB: Dec 23, 1960  Referred by: Courtney Paris, NP Reason for referral : High Risk Managed Medicaid (MM Social work unsuccessful telephone outreach )   An unsuccessful telephone outreach was attempted today. The patient was referred to the case management team for assistance with care management and care coordination.   Follow Up Plan: The patient has been provided with contact information for the care management team and has been advised to call with any health related questions or concerns.   Abelino Derrick, MHA [facility]  Managed [facility] Social Worker [phone]

## 2023-06-27 NOTE — Patient Outreach (Signed)
  Medicaid Managed Care   Unsuccessful Outreach Note  06/27/2023 Name: REATHER STELLER MRN: [id number] DOB: Dec 23, 1960  Referred by: Courtney Paris, NP Reason for referral : High Risk Managed Medicaid (MM Social work unsuccessful telephone outreach )   An unsuccessful telephone outreach was attempted today. The patient was referred to the case management team for assistance with care management and care coordination.   Follow Up Plan: The patient has been provided with contact information for the care management team and has been advised to call with any health related questions or concerns.   Abelino Derrick, MHA [facility]  Managed [facility] Social Worker [phone]

## 2023-06-29 ENCOUNTER — Other Ambulatory Visit: Payer: Self-pay

## 2023-07-01 ENCOUNTER — Inpatient Hospital Stay: Payer: Medicaid Other | Admitting: Nurse Practitioner

## 2023-07-01 ENCOUNTER — Inpatient Hospital Stay: Payer: Medicaid Other | Attending: Hematology

## 2023-07-01 NOTE — Assessment & Plan Note (Deleted)
stage II, pT3N0M0, MSS -Patient presented with lower GI bleeding -I reviewed her colonoscopy, biopsy, staging CT scan images, and the surgical pathology results with patient in great detail. -Given the stage II disease, without any high risk features,, adjuvant chemotherapy. -We discussed the role of circulating tumor DNA for monitoring -Risk of cancer recurrence in the future was discussed. The surveillance plan was also discussed, which is a physical exam and lab tests (including CBC, CMP and CEA) every 3 months for the first 2 years, then every 6-12 months, colonoscopy in one year, and surveilliance CT scan every 6-12 month for up to 5 years.  -she should be seen back in 3 months, plan to repeat a surveillance CT scan in February 2025

## 2023-07-01 NOTE — Progress Notes (Deleted)
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 -Given the stage II disease, without any high risk features,, adjuvant chemotherapy. -We discussed the role of circulating tumor DNA for monitoring -Risk of cancer recurrence in the future was discussed. The surveillance plan was also discussed, which is a physical exam and lab tests (including CBC, CMP and CEA) every 3 months for the first 2 years, then every 6-12 months, colonoscopy in one year, and surveilliance CT scan every 6-12 month for up to 5 years.  -she should be seen back in 3 months, plan to repeat a surveillance CT scan in February 2025      The patient understands the plans discussed today and is in agreement with them.  She knows to contact our office if she develops concerns prior to her next appointment.  I provided *** minutes of face-to-face time during this encounter and > 50% was spent counseling as documented under my assessment and plan.    Carlean Jews, NP  [facility] - [facility] DEPT OF [facility] [address] [place] [address] Dept: [phone] Dept Fax: [phone]   No orders of the defined types were placed in this encounter.     CHIEF COMPLAINT:  CC: cancer of right colon   Current Treatment:   surveillance  INTERVAL HISTORY:  Fredericka is here today for repeat clinical assessment. She denies fevers or chills. She denies pain. Her appetite is good. Her weight {Weight change:10426}.  I have reviewed the past medical history, past surgical history, social history and family history with the patient and they are unchanged from previous note.  ALLERGIES:  has No Known Allergies.  MEDICATIONS:  Current Outpatient Medications  Medication Sig Dispense Refill   amitriptyline (ELAVIL) 50 MG tablet Take 50 mg by mouth at bedtime.     apixaban (ELIQUIS) 5 MG TABS tablet Take 1 tablet (5 mg total) by mouth 2 (two) times daily. Start after completion of apixaban starter pack 60 tablet 2   APIXABAN (ELIQUIS) VTE STARTER PACK (10MG  AND 5MG ) Take as directed on package: start with two-5mg  tablets twice daily for 7 days. On day 8, switch to one-5mg  tablet twice daily. 74 each 0   ferrous sulfate (FEROSUL) 325 (65 FE) MG tablet Take 325 mg by mouth daily with breakfast. (Patient not taking: Reported on 05/16/2023)     gabapentin (NEURONTIN) 600 MG tablet Take 1 tablet (600 mg total) by mouth 3 (three) times daily as needed (pain). (Patient taking differently: Take 600 mg by mouth in the morning, at noon, in the evening, and at bedtime.) 90 tablet 3   mirtazapine (REMERON) 30 MG tablet Take 1 tablet (30 mg total) by mouth at bedtime. 30 tablet 3   omeprazole (PRILOSEC) 40 MG capsule Take 1 capsule (40 mg total) by mouth 2 (two) times daily as needed (acid reflux). 30 capsule 3   oxyCODONE-acetaminophen (  PERCOCET) 10-325 MG tablet Take 0.5-1 tablets by mouth every 6 (six) hours as needed for pain. 20 tablet 0   potassium chloride (KLOR-CON) 10 MEQ tablet Take 1 tablet (10 mEq total) by mouth daily. 30 tablet 2   QUEtiapine (SEROQUEL) 200 MG tablet Take 1 tablet (200 mg total) by mouth at bedtime. 30 tablet 3   Tetrahydrozoline HCl (VISINE OP) Place 2 drops into both eyes as needed (for dry eyes). Reported on  09/09/2015 (Patient not taking: Reported on 05/11/2023)     tiZANidine (ZANAFLEX) 4 MG capsule Take 4 mg by mouth 3 (three) times daily as needed for muscle spasms. (Patient not taking: Reported on 05/16/2023)     No current facility-administered medications for this visit.    HISTORY OF PRESENT ILLNESS:   Oncology History Overview Note   Cancer Staging  Cancer of right colon [facility]) Staging form: Colon and Rectum, AJCC 8th Edition - Pathologic stage from 10/22/2022: Stage IIA (pT3, pN0, cM0) - Signed by Malachy Mood, MD on 11/07/2022 Stage prefix: Initial diagnosis Total positive nodes: 0 Histologic grading system: 4 grade system Histologic grade (G): G2 Residual tumor (R): R0 - None     Cancer of right colon (HCC)  09/08/2022 Imaging    IMPRESSION: 1. Circumferential mass in the transverse limb of the hepatic flexure consistent with colon cancer. Possible synchronous lesion versus artifact in the rectosigmoid junction. Surgical consultation and/or colonoscopy recommended for further evaluation. 2. No inflammatory changes or obstruction in the bowel. No evidence of acute diverticulitis.     10/05/2022 Initial Diagnosis   Cancer of right colon (HCC)   10/06/2022 Imaging    IMPRESSION: 1. Stable CT of the chest. No specific findings identified to suggest metastatic disease to the chest. 2. Unchanged appearance of scarring, architectural distortion, and volume loss within the lateral left base and apical segment of the right upper lobe. 3. Stable appearance of small right upper lobe lung nodules which are likely postinflammatory in etiology. 4. Coronary artery calcifications. 5. Aortic Atherosclerosis (ICD10-I70.0) and Emphysema (ICD10-J43.9).   10/22/2022 Cancer Staging   Staging form: Colon and Rectum, AJCC 8th Edition - Pathologic stage from 10/22/2022: Stage IIA (pT3, pN0, cM0) - Signed by Malachy Mood, MD on 11/07/2022 Stage prefix: Initial diagnosis Total positive nodes:  0 Histologic grading system: 4 grade system Histologic grade (G): G2 Residual tumor (R): R0 - None   10/22/2022 Pathology Results    FINAL MICROSCOPIC DIAGNOSIS:  A. COLON, PROXIMAL RIGHT, RESECTION: Invasive colonic adenocarcinoma, 6 cm Carcinoma extends into pericolonic connective tissue (pT3) All surgical margins negative for carcinoma Thirty-nine lymph nodes negative for metastatic carcinoma (0/39) (pN0) Benign appendix See oncology table  ONCOLOGY TABLE: COLON AND RECTUM, CARCINOMA:  Resection Procedure: Right colectomy Tumor Site: Proximal transverse colon Tumor Size: 6 x 4.5 cm Macroscopic Tumor Perforation: Not identified Histologic Type: Colorectal adenocarcinoma, not otherwise specified Histologic Grade: Moderately differentiated, G2 Multiple Primary Sites: Not applicable Tumor Extension: Into pericolonic connective tissue Lymphovascular Invasion: Not identified Perineural Invasion: Not identified Treatment Effect: No known presurgical therapy Margins:      Margin Status for Invasive Carcinoma: All margins negative for carcinoma Regional Lymph Nodes:      Number of Lymph Nodes with Tumor: 0      Number of Lymph Nodes Examined: 39 Tumor Deposits: Not identified Distant Metastasis:      Distant Site(s) Involved: Not applicable Pathologic Stage Classification (pTNM, AJCC 8th Edition): pT3, pN0 Ancillary Studies: MMR and MSI are pending Representative Tumor Block:  A3-A7 (v4.2.0.1)        REVIEW OF SYSTEMS:   Constitutional: Denies fevers, chills or abnormal weight loss Eyes: Denies blurriness of vision Ears, nose, mouth, throat, and face: Denies mucositis or sore throat Respiratory: Denies cough, dyspnea or wheezes Cardiovascular: Denies palpitation, chest discomfort or lower extremity swelling Gastrointestinal:  Denies nausea, heartburn or change in bowel habits Skin: Denies abnormal skin rashes Lymphatics: Denies new lymphadenopathy or easy  bruising Neurological:Denies numbness, tingling or new weaknesses Behavioral/Psych: Mood is stable, no new changes  All other systems were reviewed with the patient and are negative.   VITALS:  Last menstrual period 01/26/2004.  Wt Readings from Last 3 Encounters:  05/11/23 105 lb (47.6 kg)  03/25/23 105 lb 1.6 oz (47.7 kg)  12/10/22 102 lb 14.4 oz (46.7 kg)    There is no height or weight on file to calculate BMI.  Performance status (ECOG): {CHL ONC [id number]  PHYSICAL EXAM:   GENERAL:alert, no distress and comfortable SKIN: skin color, texture, turgor are normal, no rashes or significant lesions EYES: normal, Conjunctiva are pink and non-injected, sclera clear OROPHARYNX:no exudate, no erythema and lips, buccal mucosa, and tongue normal  NECK: supple, thyroid normal size, non-tender, without nodularity LYMPH:  no palpable lymphadenopathy in the cervical, axillary or inguinal LUNGS: clear to auscultation and percussion with normal breathing effort HEART: regular rate & rhythm and no murmurs and no lower extremity edema ABDOMEN:abdomen soft, non-tender and normal bowel sounds Musculoskeletal:no cyanosis of digits and no clubbing  NEURO: alert & oriented x 3 with fluent speech, no focal motor/sensory deficits  LABORATORY DATA:  I have reviewed the data as listed    Component Value Date/Time   NA 138 05/13/2023 0948   NA 142 05/11/2018 1529   K 3.7 05/13/2023 0948   CL 109 05/13/2023 0948   CO2 23 05/13/2023 0948   GLUCOSE 94 05/13/2023 0948   BUN 12 05/13/2023 0948   BUN 10 05/11/2018 1529   CREATININE 0.73 05/13/2023 0948   CREATININE 0.68 09/12/2014 1240   CALCIUM 8.8 (L) 05/13/2023 0948   PROT 7.4 03/25/2023 1337   PROT 7.1 05/11/2018 1529   ALBUMIN 2.7 (L) 05/12/2023 0947   ALBUMIN 4.2 05/11/2018 1529   AST 17 03/25/2023 1337   ALT 13 03/25/2023 1337   ALKPHOS 70 03/25/2023 1337   BILITOT 0.7 03/25/2023 1337   BILITOT <0.2 05/11/2018 1529   GFRNONAA >60  05/13/2023 0948   GFRNONAA >89 09/24/2013 1500   GFRAA 112 05/11/2018 1529   GFRAA >89 09/24/2013 1500    No results found for: "SPEP", "UPEP"  Lab Results  Component Value Date   WBC 15.2 (H) 05/13/2023   NEUTROABS 12.1 (H) 05/13/2023   HGB 11.5 (L) 05/13/2023   HCT 34.1 (L) 05/13/2023   MCV 94.7 05/13/2023   PLT 239 05/13/2023      Chemistry      Component Value Date/Time   NA 138 05/13/2023 0948   NA 142 05/11/2018 1529   K 3.7 05/13/2023 0948   CL 109 05/13/2023 0948   CO2 23 05/13/2023 0948   BUN 12 05/13/2023 0948   BUN 10 05/11/2018 1529   CREATININE 0.73 05/13/2023 0948   CREATININE 0.68 09/12/2014 1240      Component Value Date/Time   CALCIUM 8.8 (L) 05/13/2023 0948   ALKPHOS 70 03/25/2023 1337   AST 17 03/25/2023 1337   ALT 13 03/25/2023 1337   BILITOT 0.7 03/25/2023 1337   BILITOT <0.2 05/11/2018 1529  RADIOGRAPHIC STUDIES: I have personally reviewed the radiological images as listed and agreed with the findings in the report. No results found.

## 2023-07-06 DIAGNOSIS — Z79899 Other long term (current) drug therapy: Secondary | ICD-10-CM | POA: Diagnosis not present

## 2023-07-11 ENCOUNTER — Telehealth: Payer: Self-pay

## 2023-07-11 NOTE — Telephone Encounter (Signed)
..   Medicaid Managed Care   Unsuccessful Outreach Note  07/11/2023 Name: Emily Cameron MRN: [id number] DOB: June 14, 1961  Referred by: Courtney Paris, NP Reason for referral : Appointment (I called the patient today to reschedule her missed phone appt with the [facility] BSW. The patient did not answer and I was not able to leave a message.)   A second unsuccessful telephone outreach was attempted today. The patient was referred to the case management team for assistance with care management and care coordination.   Follow Up Plan: The care management team will reach out to the patient again over the next 7 days.   Weston Settle Care Guide  [facility] Managed  Care Guide [facility]  [phone]

## 2023-07-12 DIAGNOSIS — Z79899 Other long term (current) drug therapy: Secondary | ICD-10-CM | POA: Diagnosis not present

## 2023-07-15 ENCOUNTER — Telehealth: Payer: Self-pay

## 2023-07-15 NOTE — Telephone Encounter (Signed)
..   Medicaid Managed Care   Unsuccessful Outreach Note  07/15/2023 Name: Emily Cameron MRN: [id number] DOB: 1961/05/18  Referred by: Courtney Paris, NP Reason for referral : Appointment   Third unsuccessful telephone outreach was attempted today. The patient was referred to the case management team for assistance with care management and care coordination. The patient's primary care provider has been notified of our unsuccessful attempts to make or maintain contact with the patient. The care management team is pleased to engage with this patient at any time in the future should he/she be interested in assistance from the care management team.   Follow Up Plan: We have been unable to make contact with the patient for follow up. The care management team is available to follow up with the patient after provider conversation with the patient regarding recommendation for care management engagement and subsequent re-referral to the care management team.   Weston Settle Care Guide  [facility] Managed  Care Guide [facility]  [phone]

## 2023-08-03 DIAGNOSIS — Z79899 Other long term (current) drug therapy: Secondary | ICD-10-CM | POA: Diagnosis not present

## 2023-08-05 DIAGNOSIS — Z79899 Other long term (current) drug therapy: Secondary | ICD-10-CM | POA: Diagnosis not present

## 2023-08-12 ENCOUNTER — Telehealth: Payer: Self-pay | Admitting: Hematology

## 2023-08-25 NOTE — Assessment & Plan Note (Deleted)
 stage II, pT3N0M0, MSS -Patient presented with lower GI bleeding -I reviewed her colonoscopy, biopsy, staging CT scan images, and the surgical pathology results with patient in great detail. -Given the stage II disease, without any high risk features, I do not recommend adjuvant chemotherapy. -We discussed the role of circulating tumor DNA for monitoring --I discussed the risk of cancer recurrence in the future. I discussed the surveillance plan, which is a physical exam and lab test (including CBC, CMP and CEA) every 3 months for the first 2 years, then every 6-12 months, colonoscopy in one year, and surveilliance CT scan every 6-12 month for up to 5 year.  -I will see her back in 3 months, plan to repeat a surveillance CT scan in February 2025

## 2023-08-25 NOTE — Progress Notes (Deleted)
 Patient Care Team: Leron Millman, NP as PCP - General (Nurse Practitioner) Sheldon Standing, MD as Consulting Physician (General Surgery) Burnette Fallow, MD as Consulting Physician (Gastroenterology) Lanny Callander, MD as Consulting Physician (Hematology and Oncology) Gordy Channing LABOR, RN as Registered Nurse Delene Thersia PARAS as Social Worker  Clinic Day:  08/25/2023  Referring physician: Leron Millman, NP  ASSESSMENT & PLAN:   Assessment & Plan: Cancer of right colon (HCC) stage II, pT3N0M0, MSS -Patient presented with lower GI bleeding -I reviewed her colonoscopy, biopsy, staging CT scan images, and the surgical pathology results with patient in great detail. -Given the stage II disease, without any high risk features, I do not recommend adjuvant chemotherapy. -We discussed the role of circulating tumor DNA for monitoring --I discussed the risk of cancer recurrence in the future. I discussed the surveillance plan, which is a physical exam and lab test (including CBC, CMP and CEA) every 3 months for the first 2 years, then every 6-12 months, colonoscopy in one year, and surveilliance CT scan every 6-12 month for up to 5 year.  -I will see her back in 3 months, plan to repeat a surveillance CT scan in February 2025      The patient understands the plans discussed today and is in agreement with them.  She knows to contact our office if she develops concerns prior to her next appointment.  I provided *** minutes of face-to-face time during this encounter and > 50% was spent counseling as documented under my assessment and plan.    Powell FORBES Lessen, NP  [facility] - A DEPT OF [facility] [address] [place] [address] Dept: [phone] Dept Fax: [phone]   No orders of the defined types were placed in this encounter.     CHIEF COMPLAINT:  CC: cancer of right colon   Current Treatment:  surveillance    INTERVAL HISTORY:  Emily Cameron is here today for repeat clinical assessment. Last saw myself on 03/25/2023. Due to have restaging CT CAP in 09/2023. Will order this today. She denies fevers or chills. She denies pain. Her appetite is good. Her weight {Weight change:10426}.  I have reviewed the past medical history, past surgical history, social history and family history with the patient and they are unchanged from previous note.  ALLERGIES:  has no known allergies.  MEDICATIONS:  Current Outpatient Medications  Medication Sig Dispense Refill   amitriptyline  (ELAVIL ) 50 MG tablet Take 50 mg by mouth at bedtime.     apixaban  (ELIQUIS ) 5 MG TABS tablet Take 1 tablet (5 mg total) by mouth 2 (two) times daily. Start after completion of apixaban  starter pack 60 tablet 2   APIXABAN  (ELIQUIS ) VTE STARTER PACK (10MG  AND 5MG ) Take as directed on package: start with two-5mg  tablets twice daily for 7 days. On day 8, switch to one-5mg  tablet twice daily. 74 each 0   ferrous sulfate  (FEROSUL) 325 (65 FE) MG tablet Take 325 mg by mouth daily with breakfast. (Patient not taking: Reported on 05/16/2023)     gabapentin  (NEURONTIN ) 600 MG tablet Take 1 tablet (600 mg total) by mouth 3 (three) times daily as needed (pain). (Patient taking differently: Take 600 mg by mouth in the morning, at noon, in the evening, and at bedtime.) 90 tablet 3   mirtazapine  (REMERON ) 30 MG tablet Take 1 tablet (30 mg total) by mouth at bedtime. 30 tablet 3   omeprazole  (PRILOSEC) 40  MG capsule Take 1 capsule (40 mg total) by mouth 2 (two) times daily as needed (acid reflux). 30 capsule 3   oxyCODONE -acetaminophen  (PERCOCET) 10-325 MG tablet Take 0.5-1 tablets by mouth every 6 (six) hours as needed for pain. 20 tablet 0   potassium chloride  (KLOR-CON ) 10 MEQ tablet Take 1 tablet (10 mEq total) by mouth daily. 30 tablet 2   QUEtiapine  (SEROQUEL ) 200 MG tablet Take 1 tablet (200 mg total) by mouth at bedtime. 30 tablet 3   Tetrahydrozoline HCl  (VISINE OP) Place 2 drops into both eyes as needed (for dry eyes). Reported on 09/09/2015 (Patient not taking: Reported on 05/11/2023)     tiZANidine  (ZANAFLEX ) 4 MG capsule Take 4 mg by mouth 3 (three) times daily as needed for muscle spasms. (Patient not taking: Reported on 05/16/2023)     No current facility-administered medications for this visit.    HISTORY OF PRESENT ILLNESS:   Oncology History Overview Note   Cancer Staging  Cancer of right colon [facility]) Staging form: Colon and Rectum, AJCC 8th Edition - Pathologic stage from 10/22/2022: Stage IIA (pT3, pN0, cM0) - Signed by Lanny Callander, MD on 11/07/2022 Stage prefix: Initial diagnosis Total positive nodes: 0 Histologic grading system: 4 grade system Histologic grade (G): G2 Residual tumor (R): R0 - None     Cancer of right colon (HCC)  09/08/2022 Imaging    IMPRESSION: 1. Circumferential mass in the transverse limb of the hepatic flexure consistent with colon cancer. Possible synchronous lesion versus artifact in the rectosigmoid junction. Surgical consultation and/or colonoscopy recommended for further evaluation. 2. No inflammatory changes or obstruction in the bowel. No evidence of acute diverticulitis.     10/05/2022 Initial Diagnosis   Cancer of right colon (HCC)   10/06/2022 Imaging    IMPRESSION: 1. Stable CT of the chest. No specific findings identified to suggest metastatic disease to the chest. 2. Unchanged appearance of scarring, architectural distortion, and volume loss within the lateral left base and apical segment of the right upper lobe. 3. Stable appearance of small right upper lobe lung nodules which are likely postinflammatory in etiology. 4. Coronary artery calcifications. 5. Aortic Atherosclerosis (ICD10-I70.0) and Emphysema (ICD10-J43.9).   10/22/2022 Cancer Staging   Staging form: Colon and Rectum, AJCC 8th Edition - Pathologic stage from 10/22/2022: Stage IIA (pT3, pN0, cM0) - Signed by Lanny Callander, MD  on 11/07/2022 Stage prefix: Initial diagnosis Total positive nodes: 0 Histologic grading system: 4 grade system Histologic grade (G): G2 Residual tumor (R): R0 - None   10/22/2022 Pathology Results    FINAL MICROSCOPIC DIAGNOSIS:  A. COLON, PROXIMAL RIGHT, RESECTION: Invasive colonic adenocarcinoma, 6 cm Carcinoma extends into pericolonic connective tissue (pT3) All surgical margins negative for carcinoma Thirty-nine lymph nodes negative for metastatic carcinoma (0/39) (pN0) Benign appendix See oncology table  ONCOLOGY TABLE: COLON AND RECTUM, CARCINOMA:  Resection Procedure: Right colectomy Tumor Site: Proximal transverse colon Tumor Size: 6 x 4.5 cm Macroscopic Tumor Perforation: Not identified Histologic Type: Colorectal adenocarcinoma, not otherwise specified Histologic Grade: Moderately differentiated, G2 Multiple Primary Sites: Not applicable Tumor Extension: Into pericolonic connective tissue Lymphovascular Invasion: Not identified Perineural Invasion: Not identified Treatment Effect: No known presurgical therapy Margins:      Margin Status for Invasive Carcinoma: All margins negative for carcinoma Regional Lymph Nodes:      Number of Lymph Nodes with Tumor: 0      Number of Lymph Nodes Examined: 39 Tumor Deposits: Not identified Distant Metastasis:  Distant Site(s) Involved: Not applicable Pathologic Stage Classification (pTNM, AJCC 8th Edition): pT3, pN0 Ancillary Studies: MMR and MSI are pending Representative Tumor Block: A3-A7 (v4.2.0.1)        REVIEW OF SYSTEMS:   Constitutional: Denies fevers, chills or abnormal weight loss Eyes: Denies blurriness of vision Ears, nose, mouth, throat, and face: Denies mucositis or sore throat Respiratory: Denies cough, dyspnea or wheezes Cardiovascular: Denies palpitation, chest discomfort or lower extremity swelling Gastrointestinal:  Denies nausea, heartburn or change in bowel habits Skin: Denies abnormal skin  rashes Lymphatics: Denies new lymphadenopathy or easy bruising Neurological:Denies numbness, tingling or new weaknesses Behavioral/Psych: Mood is stable, no new changes  All other systems were reviewed with the patient and are negative.   VITALS:  Last menstrual period 01/26/2004.  Wt Readings from Last 3 Encounters:  05/11/23 105 lb (47.6 kg)  03/25/23 105 lb 1.6 oz (47.7 kg)  12/10/22 102 lb 14.4 oz (46.7 kg)    There is no height or weight on file to calculate BMI.  Performance status (ECOG): {CHL ONC [id number]  PHYSICAL EXAM:   GENERAL:alert, no distress and comfortable SKIN: skin color, texture, turgor are normal, no rashes or significant lesions EYES: normal, Conjunctiva are pink and non-injected, sclera clear OROPHARYNX:no exudate, no erythema and lips, buccal mucosa, and tongue normal  NECK: supple, thyroid  normal size, non-tender, without nodularity LYMPH:  no palpable lymphadenopathy in the cervical, axillary or inguinal LUNGS: clear to auscultation and percussion with normal breathing effort HEART: regular rate & rhythm and no murmurs and no lower extremity edema ABDOMEN:abdomen soft, non-tender and normal bowel sounds Musculoskeletal:no cyanosis of digits and no clubbing  NEURO: alert & oriented x 3 with fluent speech, no focal motor/sensory deficits  LABORATORY DATA:  I have reviewed the data as listed    Component Value Date/Time   NA 138 05/13/2023 0948   NA 142 05/11/2018 1529   K 3.7 05/13/2023 0948   CL 109 05/13/2023 0948   CO2 23 05/13/2023 0948   GLUCOSE 94 05/13/2023 0948   BUN 12 05/13/2023 0948   BUN 10 05/11/2018 1529   CREATININE 0.73 05/13/2023 0948   CREATININE 0.68 09/12/2014 1240   CALCIUM  8.8 (L) 05/13/2023 0948   PROT 7.4 03/25/2023 1337   PROT 7.1 05/11/2018 1529   ALBUMIN 2.7 (L) 05/12/2023 0947   ALBUMIN 4.2 05/11/2018 1529   AST 17 03/25/2023 1337   ALT 13 03/25/2023 1337   ALKPHOS 70 03/25/2023 1337   BILITOT 0.7  03/25/2023 1337   BILITOT <0.2 05/11/2018 1529   GFRNONAA >60 05/13/2023 0948   GFRNONAA >89 09/24/2013 1500   GFRAA 112 05/11/2018 1529   GFRAA >89 09/24/2013 1500    No results found for: SPEP, UPEP  Lab Results  Component Value Date   WBC 15.2 (H) 05/13/2023   NEUTROABS 12.1 (H) 05/13/2023   HGB 11.5 (L) 05/13/2023   HCT 34.1 (L) 05/13/2023   MCV 94.7 05/13/2023   PLT 239 05/13/2023      Chemistry      Component Value Date/Time   NA 138 05/13/2023 0948   NA 142 05/11/2018 1529   K 3.7 05/13/2023 0948   CL 109 05/13/2023 0948   CO2 23 05/13/2023 0948   BUN 12 05/13/2023 0948   BUN 10 05/11/2018 1529   CREATININE 0.73 05/13/2023 0948   CREATININE 0.68 09/12/2014 1240      Component Value Date/Time   CALCIUM  8.8 (L) 05/13/2023 0948   ALKPHOS 70 03/25/2023 1337  AST 17 03/25/2023 1337   ALT 13 03/25/2023 1337   BILITOT 0.7 03/25/2023 1337   BILITOT <0.2 05/11/2018 1529       RADIOGRAPHIC STUDIES: I have personally reviewed the radiological images as listed and agreed with the findings in the report. No results found.

## 2023-08-26 ENCOUNTER — Telehealth: Payer: Self-pay | Admitting: *Deleted

## 2023-08-26 ENCOUNTER — Telehealth: Payer: Self-pay | Admitting: Nurse Practitioner

## 2023-08-26 ENCOUNTER — Inpatient Hospital Stay: Payer: Medicaid Other | Admitting: Nurse Practitioner

## 2023-08-26 ENCOUNTER — Inpatient Hospital Stay: Payer: Medicaid Other

## 2023-08-26 DIAGNOSIS — C182 Malignant neoplasm of ascending colon: Secondary | ICD-10-CM

## 2023-08-26 NOTE — Telephone Encounter (Signed)
 Patient called to cancel her appts for today. Message sent to scheduler to reschedule her appts. Provider notified

## 2023-09-01 ENCOUNTER — Inpatient Hospital Stay: Payer: Medicaid Other | Admitting: Nurse Practitioner

## 2023-09-01 ENCOUNTER — Inpatient Hospital Stay: Payer: Medicaid Other | Attending: Hematology

## 2023-09-01 NOTE — Progress Notes (Deleted)
 Patient Care Team: Leron Millman, NP as PCP - General (Nurse Practitioner) Sheldon Standing, MD as Consulting Physician (General Surgery) Burnette Fallow, MD as Consulting Physician (Gastroenterology) Lanny Callander, MD as Consulting Physician (Hematology and Oncology) Gordy Channing LABOR, RN as Registered Nurse Delene Thersia PARAS as Social Worker   CHIEF COMPLAINT:   Oncology History Overview Note   Cancer Staging  Cancer of right colon [facility]) Staging form: Colon and Rectum, AJCC 8th Edition - Pathologic stage from 10/22/2022: Stage IIA (pT3, pN0, cM0) - Signed by Lanny Callander, MD on 11/07/2022 Stage prefix: Initial diagnosis Total positive nodes: 0 Histologic grading system: 4 grade system Histologic grade (G): G2 Residual tumor (R): R0 - None     Cancer of right colon (HCC)  09/08/2022 Imaging    IMPRESSION: 1. Circumferential mass in the transverse limb of the hepatic flexure consistent with colon cancer. Possible synchronous lesion versus artifact in the rectosigmoid junction. Surgical consultation and/or colonoscopy recommended for further evaluation. 2. No inflammatory changes or obstruction in the bowel. No evidence of acute diverticulitis.     10/05/2022 Initial Diagnosis   Cancer of right colon (HCC)   10/06/2022 Imaging    IMPRESSION: 1. Stable CT of the chest. No specific findings identified to suggest metastatic disease to the chest. 2. Unchanged appearance of scarring, architectural distortion, and volume loss within the lateral left base and apical segment of the right upper lobe. 3. Stable appearance of small right upper lobe lung nodules which are likely postinflammatory in etiology. 4. Coronary artery calcifications. 5. Aortic Atherosclerosis (ICD10-I70.0) and Emphysema (ICD10-J43.9).   10/22/2022 Cancer Staging   Staging form: Colon and Rectum, AJCC 8th Edition - Pathologic stage from 10/22/2022: Stage IIA (pT3, pN0, cM0) - Signed by Lanny Callander, MD on  11/07/2022 Stage prefix: Initial diagnosis Total positive nodes: 0 Histologic grading system: 4 grade system Histologic grade (G): G2 Residual tumor (R): R0 - None   10/22/2022 Pathology Results    FINAL MICROSCOPIC DIAGNOSIS:  A. COLON, PROXIMAL RIGHT, RESECTION: Invasive colonic adenocarcinoma, 6 cm Carcinoma extends into pericolonic connective tissue (pT3) All surgical margins negative for carcinoma Thirty-nine lymph nodes negative for metastatic carcinoma (0/39) (pN0) Benign appendix See oncology table  ONCOLOGY TABLE: COLON AND RECTUM, CARCINOMA:  Resection Procedure: Right colectomy Tumor Site: Proximal transverse colon Tumor Size: 6 x 4.5 cm Macroscopic Tumor Perforation: Not identified Histologic Type: Colorectal adenocarcinoma, not otherwise specified Histologic Grade: Moderately differentiated, G2 Multiple Primary Sites: Not applicable Tumor Extension: Into pericolonic connective tissue Lymphovascular Invasion: Not identified Perineural Invasion: Not identified Treatment Effect: No known presurgical therapy Margins:      Margin Status for Invasive Carcinoma: All margins negative for carcinoma Regional Lymph Nodes:      Number of Lymph Nodes with Tumor: 0      Number of Lymph Nodes Examined: 39 Tumor Deposits: Not identified Distant Metastasis:      Distant Site(s) Involved: Not applicable Pathologic Stage Classification (pTNM, AJCC 8th Edition): pT3, pN0 Ancillary Studies: MMR and MSI are pending Representative Tumor Block: A3-A7 (v4.2.0.1)       CURRENT THERAPY:   INTERVAL HISTORY   ROS   Past Medical History:  Diagnosis Date   Alcohol abuse    Anemia    Anxiety    Arthritis    Cancer of hepatic flexure (HCC)    COPD (chronic obstructive pulmonary disease) (HCC)    Depression    DVT (deep venous thrombosis) (HCC) 12/24/2013   GERD (gastroesophageal reflux disease)  History of kidney stones    Hypertension    Insomnia    Other pulmonary  embolism without acute cor pulmonale (HCC) 03/05/2011   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 Marijuna not taking since 7 month.    Sleep apnea    Weight loss    due to anxiety     Past Surgical History:  Procedure Laterality Date   ABDOMINAL HYSTERECTOMY     BIOPSY  09/27/2022   Procedure: BIOPSY;  Surgeon: Burnette Fallow, MD;  Location: [facility];  Service: Gastroenterology;;   COLONOSCOPY WITH PROPOFOL  Bilateral 09/27/2022   Procedure: COLONOSCOPY WITH PROPOFOL ;  Surgeon: Burnette Fallow, MD;  Location: [facility];  Service: Gastroenterology;  Laterality: Bilateral;   CYST EXCISION     HEMOSTASIS CLIP PLACEMENT  09/27/2022   Procedure: HEMOSTASIS CLIP PLACEMENT;  Surgeon: Burnette Fallow, MD;  Location: [facility];  Service: Gastroenterology;;   LITHOTRIPSY     MICROLARYNGOSCOPY Left 06/20/2014   Procedure: MICROLARYNGOSCOPY WITH REMOVAL OF LEFT VOCAL CORD MASS;  Surgeon: Norleen Notice, MD;  Location: [facility];  Service: ENT;  Laterality: Left;   POLYPECTOMY  09/27/2022   Procedure: POLYPECTOMY;  Surgeon: Burnette Fallow, MD;  Location: [facility];  Service: Gastroenterology;;   SUBMUCOSAL TATTOO INJECTION  09/27/2022   Procedure: SUBMUCOSAL TATTOO INJECTION;  Surgeon: Burnette Fallow, MD;  Location: [facility];  Service: Gastroenterology;;     Outpatient Encounter Medications as of 09/01/2023  Medication Sig Note   amitriptyline  (ELAVIL ) 50 MG tablet Take 50 mg by mouth at bedtime.    apixaban  (ELIQUIS ) 5 MG TABS tablet Take 1 tablet (5 mg total) by mouth 2 (two) times daily. Start after completion of apixaban  starter pack 05/18/2023: Currently completing 7 day starter pack   APIXABAN  (ELIQUIS ) VTE STARTER PACK (10MG  AND 5MG ) Take as directed on package: start with two-5mg  tablets twice daily for 7  days. On day 8, switch to one-5mg  tablet twice daily.    ferrous sulfate  (FEROSUL) 325 (65 FE) MG tablet Take 325 mg by mouth daily with breakfast. (Patient not taking: Reported on 05/16/2023)    gabapentin  (NEURONTIN ) 600 MG tablet Take 1 tablet (600 mg total) by mouth 3 (three) times daily as needed (pain). (Patient taking differently: Take 600 mg by mouth in the morning, at noon, in the evening, and at bedtime.)    mirtazapine  (REMERON ) 30 MG tablet Take 1 tablet (30 mg total) by mouth at bedtime.    omeprazole  (PRILOSEC) 40 MG capsule Take 1 capsule (40 mg total) by mouth 2 (two) times daily as needed (acid reflux).    oxyCODONE -acetaminophen  (PERCOCET) 10-325 MG tablet Take 0.5-1 tablets by mouth every 6 (six) hours as needed for pain.    potassium chloride  (KLOR-CON ) 10 MEQ tablet Take 1 tablet (10 mEq total) by mouth daily.    QUEtiapine  (SEROQUEL ) 200 MG tablet Take 1 tablet (200 mg total) by mouth at bedtime.    Tetrahydrozoline HCl (VISINE OP) Place 2 drops into both eyes as needed (for dry eyes). Reported on 09/09/2015 (Patient not taking: Reported on 05/11/2023)    tiZANidine  (ZANAFLEX ) 4 MG capsule Take 4 mg by mouth 3 (three) times daily as needed for muscle spasms. (Patient not taking: Reported on 05/16/2023)    No facility-administered encounter medications on file as  of 09/01/2023.     There were no vitals filed for this visit. There is no height or weight on file to calculate BMI.   PHYSICAL EXAM GENERAL:alert, no distress and comfortable SKIN: no rash  EYES: sclera clear NECK: without mass LYMPH:  no palpable cervical or supraclavicular lymphadenopathy  LUNGS: clear with normal breathing effort HEART: regular rate & rhythm, no lower extremity edema ABDOMEN: abdomen soft, non-tender and normal bowel sounds NEURO: alert & oriented x 3 with fluent speech, no focal motor/sensory deficits Breast exam:  PAC without erythema    CBC    Component Value Date/Time   WBC 15.2 (H)  05/13/2023 0948   RBC 3.60 (L) 05/13/2023 0948   HGB 11.5 (L) 05/13/2023 0948   HGB 12.3 06/15/2018 1655   HCT 34.1 (L) 05/13/2023 0948   HCT 36.6 06/15/2018 1655   PLT 239 05/13/2023 0948   PLT 204 06/15/2018 1655   MCV 94.7 05/13/2023 0948   MCV 90 06/15/2018 1655   MCH 31.9 05/13/2023 0948   MCHC 33.7 05/13/2023 0948   RDW 13.6 05/13/2023 0948   RDW 15.1 06/15/2018 1655   LYMPHSABS 1.8 05/13/2023 0948   LYMPHSABS 3.0 06/15/2018 1655   MONOABS 1.1 (H) 05/13/2023 0948   EOSABS 0.1 05/13/2023 0948   EOSABS 0.1 06/15/2018 1655   BASOSABS 0.0 05/13/2023 0948   BASOSABS 0.0 06/15/2018 1655     CMP     Component Value Date/Time   NA 138 05/13/2023 0948   NA 142 05/11/2018 1529   K 3.7 05/13/2023 0948   CL 109 05/13/2023 0948   CO2 23 05/13/2023 0948   GLUCOSE 94 05/13/2023 0948   BUN 12 05/13/2023 0948   BUN 10 05/11/2018 1529   CREATININE 0.73 05/13/2023 0948   CREATININE 0.68 09/12/2014 1240   CALCIUM  8.8 (L) 05/13/2023 0948   PROT 7.4 03/25/2023 1337   PROT 7.1 05/11/2018 1529   ALBUMIN 2.7 (L) 05/12/2023 0947   ALBUMIN 4.2 05/11/2018 1529   AST 17 03/25/2023 1337   ALT 13 03/25/2023 1337   ALKPHOS 70 03/25/2023 1337   BILITOT 0.7 03/25/2023 1337   BILITOT <0.2 05/11/2018 1529   GFRNONAA >60 05/13/2023 0948   GFRNONAA >89 09/24/2013 1500   GFRAA 112 05/11/2018 1529   GFRAA >89 09/24/2013 1500     ASSESSMENT & PLAN:  63 year old female   Right-sided colon cancer, stage II, pT3N0M0, MSS -Patient presented with lower GI bleeding -Colonoscopy 09/27/22 ([facility]) showed partially obstructing tumor at the hepatic flexure, biopsy confirmed adenocarcinoma -Staging scans negative -S/p right colectomy 10/22/22 by Dr. Sheldon, path showed stage II without any high risk features, adjuvant chemotherapy was not indicated. -On surveillance   PLAN:  No orders of the defined types were placed in this encounter.     All questions were answered. The patient knows to call the  clinic with any problems, questions or concerns. No barriers to learning were detected. I spent *** counseling the patient face to face. The total time spent in the appointment was *** and more than 50% was on counseling, review of test results, and coordination of care.   Emily Grand, NP-C @DATE @

## 2023-09-06 DIAGNOSIS — Z131 Encounter for screening for diabetes mellitus: Secondary | ICD-10-CM | POA: Diagnosis not present

## 2023-09-06 DIAGNOSIS — Z79899 Other long term (current) drug therapy: Secondary | ICD-10-CM | POA: Diagnosis not present

## 2023-09-06 DIAGNOSIS — E559 Vitamin D deficiency, unspecified: Secondary | ICD-10-CM | POA: Diagnosis not present

## 2023-09-06 DIAGNOSIS — M129 Arthropathy, unspecified: Secondary | ICD-10-CM | POA: Diagnosis not present

## 2023-09-08 DIAGNOSIS — Z79899 Other long term (current) drug therapy: Secondary | ICD-10-CM | POA: Diagnosis not present

## 2023-10-08 DIAGNOSIS — Z79899 Other long term (current) drug therapy: Secondary | ICD-10-CM | POA: Diagnosis not present

## 2023-10-24 ENCOUNTER — Telehealth: Payer: Self-pay

## 2023-10-24 NOTE — Telephone Encounter (Signed)
 Patient called in stating she has not received a callback or heard anything from our office regarding getting her appointment scheduled.  I verified patient name, DOB, telephone number, and address. Patient let me know that she had moved and her telephone number has been changed.  I updated patient's demographics and confirmed changes with patient.  Patient's telephone call was transferred to our scheduler, Aram Beecham to further assist. Patient was able to get appointments scheduled as requested.

## 2023-11-01 ENCOUNTER — Other Ambulatory Visit: Payer: Self-pay

## 2023-11-01 NOTE — Assessment & Plan Note (Signed)
-  Stage IIA (pT3N0M0), MSS -Patient presented with lower GI bleeding -Status post right hemicolectomy -He is on cancer surveillance with routine lab including Signatera, and CT scan every 6-12 months for first 2-3 years

## 2023-11-02 ENCOUNTER — Inpatient Hospital Stay: Attending: Hematology

## 2023-11-02 ENCOUNTER — Encounter: Payer: Self-pay | Admitting: Hematology

## 2023-11-02 ENCOUNTER — Inpatient Hospital Stay (HOSPITAL_BASED_OUTPATIENT_CLINIC_OR_DEPARTMENT_OTHER): Admitting: Hematology

## 2023-11-02 VITALS — BP 134/80 | HR 102 | Temp 97.3°F | Resp 20 | Ht 64.0 in | Wt 135.5 lb

## 2023-11-02 DIAGNOSIS — Z9049 Acquired absence of other specified parts of digestive tract: Secondary | ICD-10-CM | POA: Diagnosis not present

## 2023-11-02 DIAGNOSIS — F17210 Nicotine dependence, cigarettes, uncomplicated: Secondary | ICD-10-CM | POA: Diagnosis not present

## 2023-11-02 DIAGNOSIS — F1010 Alcohol abuse, uncomplicated: Secondary | ICD-10-CM | POA: Insufficient documentation

## 2023-11-02 DIAGNOSIS — C182 Malignant neoplasm of ascending colon: Secondary | ICD-10-CM | POA: Insufficient documentation

## 2023-11-02 LAB — CBC WITH DIFFERENTIAL/PLATELET
Abs Immature Granulocytes: 0.03 10*3/uL (ref 0.00–0.07)
Basophils Absolute: 0 10*3/uL (ref 0.0–0.1)
Basophils Relative: 1 %
Eosinophils Absolute: 0.2 10*3/uL (ref 0.0–0.5)
Eosinophils Relative: 2 %
HCT: 37.3 % (ref 36.0–46.0)
Hemoglobin: 13 g/dL (ref 12.0–15.0)
Immature Granulocytes: 0 %
Lymphocytes Relative: 26 %
Lymphs Abs: 2.3 10*3/uL (ref 0.7–4.0)
MCH: 30.5 pg (ref 26.0–34.0)
MCHC: 34.9 g/dL (ref 30.0–36.0)
MCV: 87.6 fL (ref 80.0–100.0)
Monocytes Absolute: 0.8 10*3/uL (ref 0.1–1.0)
Monocytes Relative: 9 %
Neutro Abs: 5.5 10*3/uL (ref 1.7–7.7)
Neutrophils Relative %: 62 %
Platelets: 214 10*3/uL (ref 150–400)
RBC: 4.26 MIL/uL (ref 3.87–5.11)
RDW: 15 % (ref 11.5–15.5)
WBC: 8.8 10*3/uL (ref 4.0–10.5)
nRBC: 0 % (ref 0.0–0.2)

## 2023-11-02 LAB — COMPREHENSIVE METABOLIC PANEL
ALT: 17 U/L (ref 0–44)
AST: 20 U/L (ref 15–41)
Albumin: 4 g/dL (ref 3.5–5.0)
Alkaline Phosphatase: 83 U/L (ref 38–126)
Anion gap: 5 (ref 5–15)
BUN: 13 mg/dL (ref 6–20)
CO2: 29 mmol/L (ref 22–32)
Calcium: 8.8 mg/dL — ABNORMAL LOW (ref 8.9–10.3)
Chloride: 106 mmol/L (ref 98–111)
Creatinine, Ser: 0.93 mg/dL (ref 0.44–1.00)
GFR, Estimated: >60 mL/min (ref >60)
Glucose, Bld: 114 mg/dL — ABNORMAL HIGH (ref 70–99)
Potassium: 3.4 mmol/L — ABNORMAL LOW (ref 3.5–5.1)
Sodium: 140 mmol/L (ref 135–145)
Total Bilirubin: 0.3 mg/dL (ref 0.0–1.2)
Total Protein: 7.3 g/dL (ref 6.5–8.1)

## 2023-11-02 NOTE — Progress Notes (Signed)
 [facility]   [phone] [phone]   Clinic Follow up Note   Patient Care Team: Courtney Paris, NP as PCP - General (Nurse Practitioner) Karie Soda, MD as Consulting Physician (General Surgery) Willis Modena, MD as Consulting Physician (Gastroenterology) Malachy Mood, MD as Consulting Physician (Hematology and Oncology) Marcos Eke, RN as Registered Nurse Shaune Leeks as Social Worker  Date of Service:  11/02/2023  CHIEF COMPLAINT: f/u of colon cancer  CURRENT THERAPY:  Cancer surveillance  Oncology History   Cancer of right colon (HCC) -Stage IIA (pT3N0M0), MSS -Patient presented with lower GI bleeding -Status post right hemicolectomy -He is on cancer surveillance with routine lab including Signatera, and CT scan every 6-12 months for first 2-3 years    Assessment and Plan    Stage II Colon Cancer Stage II colon cancer with low to moderate risk of recurrence. She is currently asymptomatic with no new concerns. Previous anemia has resolved. Last CT scan was in September 2024, and she is due for a follow-up colonoscopy as it has been almost a year since surgery in March 2024. The risk of recurrence is closely monitored for the first two years post-surgery, with a significant drop in risk after three years. - Schedule colonoscopy within the next month - Order CT scan in September 2025  Tobacco Use Disorder She smokes half a pack of cigarettes per day, which is affecting lung health as evidenced by abnormal lung sounds. Discussed the importance of smoking cessation to prevent further lung damage and potential need for oxygen therapy. - Advise smoking cessation - Discuss risks of continued smoking and benefits of quitting   Plan -Follow-up in 6 months with lab, CT scan 1 week before - Schedule follow-up appointment with primary care physician within the next three months and a repeat CBC         SUMMARY OF ONCOLOGIC  HISTORY: Oncology History Overview Note   Cancer Staging  Cancer of right colon [facility]) Staging form: Colon and Rectum, AJCC 8th Edition - Pathologic stage from 10/22/2022: Stage IIA (pT3, pN0, cM0) - Signed by Malachy Mood, MD on 11/07/2022 Stage prefix: Initial diagnosis Total positive nodes: 0 Histologic grading system: 4 grade system Histologic grade (G): G2 Residual tumor (R): R0 - None     Cancer of right colon (HCC)  09/08/2022 Imaging    IMPRESSION: 1. Circumferential mass in the transverse limb of the hepatic flexure consistent with colon cancer. Possible synchronous lesion versus artifact in the rectosigmoid junction. Surgical consultation and/or colonoscopy recommended for further evaluation. 2. No inflammatory changes or obstruction in the bowel. No evidence of acute diverticulitis.     10/05/2022 Initial Diagnosis   Cancer of right colon (HCC)   10/06/2022 Imaging    IMPRESSION: 1. Stable CT of the chest. No specific findings identified to suggest metastatic disease to the chest. 2. Unchanged appearance of scarring, architectural distortion, and volume loss within the lateral left base and apical segment of the right upper lobe. 3. Stable appearance of small right upper lobe lung nodules which are likely postinflammatory in etiology. 4. Coronary artery calcifications. 5. Aortic Atherosclerosis (ICD10-I70.0) and Emphysema (ICD10-J43.9).   10/22/2022 Cancer Staging   Staging form: Colon and Rectum, AJCC 8th Edition - Pathologic stage from 10/22/2022: Stage IIA (pT3, pN0, cM0) - Signed by Malachy Mood, MD on 11/07/2022 Stage prefix: Initial diagnosis Total positive nodes: 0 Histologic grading system: 4 grade system Histologic grade (G): G2 Residual tumor (R): R0 - None  10/22/2022 Pathology Results    FINAL MICROSCOPIC DIAGNOSIS:  A. COLON, PROXIMAL RIGHT, RESECTION: Invasive colonic adenocarcinoma, 6 cm Carcinoma extends into pericolonic connective tissue (pT3) All  surgical margins negative for carcinoma Thirty-nine lymph nodes negative for metastatic carcinoma (0/39) (pN0) Benign appendix See oncology table  ONCOLOGY TABLE: COLON AND RECTUM, CARCINOMA:  Resection Procedure: Right colectomy Tumor Site: Proximal transverse colon Tumor Size: 6 x 4.5 cm Macroscopic Tumor Perforation: Not identified Histologic Type: Colorectal adenocarcinoma, not otherwise specified Histologic Grade: Moderately differentiated, G2 Multiple Primary Sites: Not applicable Tumor Extension: Into pericolonic connective tissue Lymphovascular Invasion: Not identified Perineural Invasion: Not identified Treatment Effect: No known presurgical therapy Margins:      Margin Status for Invasive Carcinoma: All margins negative for carcinoma Regional Lymph Nodes:      Number of Lymph Nodes with Tumor: 0      Number of Lymph Nodes Examined: 39 Tumor Deposits: Not identified Distant Metastasis:      Distant Site(s) Involved: Not applicable Pathologic Stage Classification (pTNM, AJCC 8th Edition): pT3, pN0 Ancillary Studies: MMR and MSI are pending Representative Tumor Block: A3-A7 (v4.2.0.1)       Discussed the use of AI scribe software for clinical note transcription with the patient, who gave verbal consent to proceed.  History of Present Illness   Emily Cameron, a patient with a history of stage two colon cancer, presents for a routine follow-up. She reports no new health issues in the past four months and denies any stomach issues or blood in the stool. She does mention occasional constipation, which she attributes to her consumption of Ensure. She also reports bloating, which she attributes to increased food intake. She has been adhering to her current medication regimen, which includes Ensure. She has a history of mild anemia, which has since resolved. She also mentions that she smokes half a pack of cigarettes a day.         All other systems were reviewed with the  patient and are negative.  MEDICAL HISTORY:  Past Medical History:  Diagnosis Date   Alcohol abuse    Anemia    Anxiety    Arthritis    Cancer of hepatic flexure (HCC)    COPD (chronic obstructive pulmonary disease) (HCC)    Depression    DVT (deep venous thrombosis) (HCC) 12/24/2013   GERD (gastroesophageal reflux disease)    History of kidney stones    Hypertension    Insomnia    Other pulmonary embolism without acute cor pulmonale (HCC) 03/05/2011   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 Marijuna not taking since 7 month.    Sleep apnea    Weight loss    due to anxiety    SURGICAL HISTORY: Past Surgical History:  Procedure Laterality Date   ABDOMINAL HYSTERECTOMY     BIOPSY  09/27/2022   Procedure: BIOPSY;  Surgeon: Willis Modena, MD;  Location: [facility];  Service: Gastroenterology;;   COLONOSCOPY WITH PROPOFOL Bilateral 09/27/2022   Procedure: COLONOSCOPY WITH PROPOFOL;  Surgeon: Willis Modena, MD;  Location: [facility];  Service: Gastroenterology;  Laterality: Bilateral;   CYST EXCISION     HEMOSTASIS CLIP PLACEMENT  09/27/2022   Procedure: HEMOSTASIS CLIP PLACEMENT;  Surgeon: Willis Modena, MD;  Location: [facility];  Service: Gastroenterology;;  LITHOTRIPSY     MICROLARYNGOSCOPY Left 06/20/2014   Procedure: MICROLARYNGOSCOPY WITH REMOVAL OF LEFT VOCAL CORD MASS;  Surgeon: Suzanna Obey, MD;  Location: [facility] OR;  Service: ENT;  Laterality: Left;   POLYPECTOMY  09/27/2022   Procedure: POLYPECTOMY;  Surgeon: Willis Modena, MD;  Location: [facility];  Service: Gastroenterology;;   SUBMUCOSAL TATTOO INJECTION  09/27/2022   Procedure: SUBMUCOSAL TATTOO INJECTION;  Surgeon: Willis Modena, MD;  Location: [facility];  Service: Gastroenterology;;    I have reviewed the social  history and family history with the patient and they are unchanged from previous note.  ALLERGIES:  has no known allergies.  MEDICATIONS:  Current Outpatient Medications  Medication Sig Dispense Refill   amitriptyline (ELAVIL) 50 MG tablet Take 50 mg by mouth at bedtime.     apixaban (ELIQUIS) 5 MG TABS tablet Take 1 tablet (5 mg total) by mouth 2 (two) times daily. Start after completion of apixaban starter pack 60 tablet 2   APIXABAN (ELIQUIS) VTE STARTER PACK (10MG  AND 5MG ) Take as directed on package: start with two-5mg  tablets twice daily for 7 days. On day 8, switch to one-5mg  tablet twice daily. 74 each 0   ferrous sulfate (FEROSUL) 325 (65 FE) MG tablet Take 325 mg by mouth daily with breakfast.     gabapentin (NEURONTIN) 600 MG tablet Take 1 tablet (600 mg total) by mouth 3 (three) times daily as needed (pain). (Patient taking differently: Take 600 mg by mouth in the morning, at noon, in the evening, and at bedtime.) 90 tablet 3   mirtazapine (REMERON) 30 MG tablet Take 1 tablet (30 mg total) by mouth at bedtime. 30 tablet 3   omeprazole (PRILOSEC) 40 MG capsule Take 1 capsule (40 mg total) by mouth 2 (two) times daily as needed (acid reflux). 30 capsule 3   oxyCODONE-acetaminophen (PERCOCET) 10-325 MG tablet Take 0.5-1 tablets by mouth every 6 (six) hours as needed for pain. 20 tablet 0   potassium chloride (KLOR-CON) 10 MEQ tablet Take 1 tablet (10 mEq total) by mouth daily. 30 tablet 2   QUEtiapine (SEROQUEL) 200 MG tablet Take 1 tablet (200 mg total) by mouth at bedtime. 30 tablet 3   Tetrahydrozoline HCl (VISINE OP) Place 2 drops into both eyes as needed (for dry eyes). Reported on 09/09/2015     tiZANidine (ZANAFLEX) 4 MG capsule Take 4 mg by mouth 3 (three) times daily as needed for muscle spasms.     No current facility-administered medications for this visit.    PHYSICAL EXAMINATION: ECOG PERFORMANCE STATUS: 0 - Asymptomatic  Vitals:   11/02/23 1057  BP: 134/80  Pulse:  (!) 102  Resp: 20  Temp: (!) 97.3 F (36.3 C)  SpO2: 98%   Wt Readings from Last 3 Encounters:  11/02/23 135 lb 8 oz (61.5 kg)  05/11/23 105 lb (47.6 kg)  03/25/23 105 lb 1.6 oz (47.7 kg)     GENERAL:alert, no distress and comfortable SKIN: skin color, texture, turgor are normal, no rashes or significant lesions EYES: normal, Conjunctiva are pink and non-injected, sclera clear NECK: supple, thyroid normal size, non-tender, without nodularity LYMPH:  no palpable lymphadenopathy in the cervical, axillary  LUNGS: clear to auscultation and percussion with normal breathing effort HEART: regular rate & rhythm and no murmurs and no lower extremity edema ABDOMEN:abdomen soft, non-tender and normal bowel sounds Musculoskeletal:no cyanosis of digits and no clubbing  NEURO: alert & oriented x 3 with fluent speech, no focal motor/sensory deficits   LABORATORY  DATA:  I have reviewed the data as listed    Latest Ref Rng & Units 11/02/2023   10:43 AM 05/13/2023    9:48 AM 05/12/2023    9:47 AM  CBC  WBC 4.0 - 10.5 K/uL 8.8  15.2  14.1   Hemoglobin 12.0 - 15.0 g/dL 16.1  09.6  04.5   Hematocrit 36.0 - 46.0 % 37.3  34.1  34.3   Platelets 150 - 400 K/uL 214  239  226         Latest Ref Rng & Units 11/02/2023   10:43 AM 05/13/2023    9:48 AM 05/12/2023    9:47 AM  CMP  Glucose 70 - 99 mg/dL 409  94  811   BUN 6 - 20 mg/dL 13  12  14    Creatinine 0.44 - 1.00 mg/dL 9.14  7.82  9.56   Sodium 135 - 145 mmol/L 140  138  138   Potassium 3.5 - 5.1 mmol/L 3.4  3.7  4.4   Chloride 98 - 111 mmol/L 106  109  110   CO2 22 - 32 mmol/L 29  23  18    Calcium 8.9 - 10.3 mg/dL 8.8  8.8  8.7   Total Protein 6.5 - 8.1 g/dL 7.3     Total Bilirubin 0.0 - 1.2 mg/dL 0.3     Alkaline Phos 38 - 126 U/L 83     AST 15 - 41 U/L 20     ALT 0 - 44 U/L 17         RADIOGRAPHIC STUDIES: I have personally reviewed the radiological images as listed and agreed with the findings in the report. No results found.     Orders Placed This Encounter  Procedures   CT CHEST ABDOMEN PELVIS W CONTRAST    Standing Status:   Future    Expected Date:   04/26/2024    Expiration Date:   11/01/2024    If indicated for the ordered procedure, I authorize the administration of contrast media per Radiology protocol:   Yes    Does the patient have a contrast media/X-ray dye allergy?:   Yes    Is patient pregnant?:   No    Preferred imaging location?:   [facility]    If indicated for the ordered procedure, I authorize the administration of oral contrast media per Radiology protocol:   Yes   All questions were answered. The patient knows to call the clinic with any problems, questions or concerns. No barriers to learning was detected. The total time spent in the appointment was 25 minutes.     Malachy Mood, MD 11/02/2023

## 2023-11-03 ENCOUNTER — Telehealth: Payer: Self-pay | Admitting: Hematology

## 2023-11-03 DIAGNOSIS — Z79899 Other long term (current) drug therapy: Secondary | ICD-10-CM | POA: Diagnosis not present

## 2023-11-03 NOTE — Telephone Encounter (Signed)
 Patient's contact is out of service, was able to reach patient's daughter Lillyth Spong) in regards to scheduled appointment times/dates per scheduling orders on 11/02/2023, she also has the number for her to scheduled her scan in September as well. Patient's daughter has direct contact if any appointments need to be changed or adjusted

## 2023-11-07 DIAGNOSIS — Z79899 Other long term (current) drug therapy: Secondary | ICD-10-CM | POA: Diagnosis not present

## 2023-12-06 DIAGNOSIS — Z79899 Other long term (current) drug therapy: Secondary | ICD-10-CM | POA: Diagnosis not present

## 2023-12-08 DIAGNOSIS — Z79899 Other long term (current) drug therapy: Secondary | ICD-10-CM | POA: Diagnosis not present

## 2023-12-14 DIAGNOSIS — Z85038 Personal history of other malignant neoplasm of large intestine: Secondary | ICD-10-CM | POA: Diagnosis not present

## 2023-12-14 DIAGNOSIS — Z08 Encounter for follow-up examination after completed treatment for malignant neoplasm: Secondary | ICD-10-CM | POA: Diagnosis not present

## 2023-12-14 DIAGNOSIS — Z980 Intestinal bypass and anastomosis status: Secondary | ICD-10-CM | POA: Diagnosis not present

## 2024-01-05 DIAGNOSIS — Z79899 Other long term (current) drug therapy: Secondary | ICD-10-CM | POA: Diagnosis not present

## 2024-01-09 DIAGNOSIS — Z79899 Other long term (current) drug therapy: Secondary | ICD-10-CM | POA: Diagnosis not present

## 2024-02-03 DIAGNOSIS — Z79899 Other long term (current) drug therapy: Secondary | ICD-10-CM | POA: Diagnosis not present

## 2024-02-07 DIAGNOSIS — Z79899 Other long term (current) drug therapy: Secondary | ICD-10-CM | POA: Diagnosis not present

## 2024-02-16 DIAGNOSIS — R5383 Other fatigue: Secondary | ICD-10-CM | POA: Diagnosis not present

## 2024-02-16 DIAGNOSIS — Z136 Encounter for screening for cardiovascular disorders: Secondary | ICD-10-CM | POA: Diagnosis not present

## 2024-02-16 DIAGNOSIS — Z1159 Encounter for screening for other viral diseases: Secondary | ICD-10-CM | POA: Diagnosis not present

## 2024-02-16 DIAGNOSIS — D539 Nutritional anemia, unspecified: Secondary | ICD-10-CM | POA: Diagnosis not present

## 2024-02-16 DIAGNOSIS — Z0000 Encounter for general adult medical examination without abnormal findings: Secondary | ICD-10-CM | POA: Diagnosis not present

## 2024-02-16 DIAGNOSIS — Z131 Encounter for screening for diabetes mellitus: Secondary | ICD-10-CM | POA: Diagnosis not present

## 2024-02-16 DIAGNOSIS — E559 Vitamin D deficiency, unspecified: Secondary | ICD-10-CM | POA: Diagnosis not present

## 2024-03-06 DIAGNOSIS — Z79899 Other long term (current) drug therapy: Secondary | ICD-10-CM | POA: Diagnosis not present

## 2024-03-08 DIAGNOSIS — Z79899 Other long term (current) drug therapy: Secondary | ICD-10-CM | POA: Diagnosis not present

## 2024-04-02 DIAGNOSIS — Z79899 Other long term (current) drug therapy: Secondary | ICD-10-CM | POA: Diagnosis not present

## 2024-04-06 DIAGNOSIS — Z79899 Other long term (current) drug therapy: Secondary | ICD-10-CM | POA: Diagnosis not present

## 2024-04-24 ENCOUNTER — Other Ambulatory Visit: Payer: Self-pay

## 2024-04-25 ENCOUNTER — Other Ambulatory Visit: Payer: Self-pay

## 2024-04-25 DIAGNOSIS — C182 Malignant neoplasm of ascending colon: Secondary | ICD-10-CM

## 2024-04-26 ENCOUNTER — Ambulatory Visit (HOSPITAL_COMMUNITY)
Admission: RE | Admit: 2024-04-26 | Discharge: 2024-04-26 | Disposition: A | Discharge status: Home / Self Care | Source: Ambulatory Visit | Attending: Hematology | Admitting: Hematology

## 2024-04-26 ENCOUNTER — Inpatient Hospital Stay: Attending: Nurse Practitioner

## 2024-04-26 ENCOUNTER — Other Ambulatory Visit (HOSPITAL_COMMUNITY)

## 2024-04-26 DIAGNOSIS — C182 Malignant neoplasm of ascending colon: Secondary | ICD-10-CM | POA: Diagnosis present

## 2024-04-26 DIAGNOSIS — Z7901 Long term (current) use of anticoagulants: Secondary | ICD-10-CM | POA: Diagnosis not present

## 2024-04-26 DIAGNOSIS — J432 Centrilobular emphysema: Secondary | ICD-10-CM | POA: Diagnosis not present

## 2024-04-26 DIAGNOSIS — Z79899 Other long term (current) drug therapy: Secondary | ICD-10-CM | POA: Diagnosis not present

## 2024-04-26 DIAGNOSIS — Z87891 Personal history of nicotine dependence: Secondary | ICD-10-CM | POA: Diagnosis not present

## 2024-04-26 DIAGNOSIS — N200 Calculus of kidney: Secondary | ICD-10-CM | POA: Diagnosis not present

## 2024-04-26 DIAGNOSIS — K7689 Other specified diseases of liver: Secondary | ICD-10-CM | POA: Diagnosis not present

## 2024-04-26 DIAGNOSIS — Z85038 Personal history of other malignant neoplasm of large intestine: Secondary | ICD-10-CM | POA: Diagnosis present

## 2024-04-26 LAB — CBC WITH DIFFERENTIAL (CANCER CENTER ONLY)
Abs Immature Granulocytes: 0.01 K/uL (ref 0.00–0.07)
Basophils Absolute: 0 K/uL (ref 0.0–0.1)
Basophils Relative: 0 %
Eosinophils Absolute: 0.1 K/uL (ref 0.0–0.5)
Eosinophils Relative: 1 %
HCT: 39 % (ref 36.0–46.0)
Hemoglobin: 14.2 g/dL (ref 12.0–15.0)
Immature Granulocytes: 0 %
Lymphocytes Relative: 23 %
Lymphs Abs: 1.9 K/uL (ref 0.7–4.0)
MCH: 32.6 pg (ref 26.0–34.0)
MCHC: 36.4 g/dL — ABNORMAL HIGH (ref 30.0–36.0)
MCV: 89.7 fL (ref 80.0–100.0)
Monocytes Absolute: 0.7 K/uL (ref 0.1–1.0)
Monocytes Relative: 8 %
Neutro Abs: 5.6 K/uL (ref 1.7–7.7)
Neutrophils Relative %: 68 %
Platelet Count: 167 K/uL (ref 150–400)
RBC: 4.35 MIL/uL (ref 3.87–5.11)
RDW: 15.3 % (ref 11.5–15.5)
WBC Count: 8.4 K/uL (ref 4.0–10.5)
nRBC: 0 % (ref 0.0–0.2)

## 2024-04-26 LAB — CMP (CANCER CENTER ONLY)
ALT: 13 U/L (ref 0–44)
AST: 13 U/L — ABNORMAL LOW (ref 15–41)
Albumin: 4 g/dL (ref 3.5–5.0)
Alkaline Phosphatase: 73 U/L (ref 38–126)
Anion gap: 6 (ref 5–15)
BUN: 10 mg/dL (ref 6–20)
CO2: 28 mmol/L (ref 22–32)
Calcium: 9.2 mg/dL (ref 8.9–10.3)
Chloride: 110 mmol/L (ref 98–111)
Creatinine: 0.73 mg/dL (ref 0.44–1.00)
GFR, Estimated: >60 mL/min (ref >60)
Glucose, Bld: 87 mg/dL (ref 70–99)
Potassium: 3.3 mmol/L — ABNORMAL LOW (ref 3.5–5.1)
Sodium: 144 mmol/L (ref 135–145)
Total Bilirubin: 0.6 mg/dL (ref 0.0–1.2)
Total Protein: 7.8 g/dL (ref 6.5–8.1)

## 2024-04-26 MED ORDER — IOHEXOL 9 MG/ML PO SOLN
ORAL | Status: AC
  Filled 2024-04-26: qty 1000

## 2024-04-26 MED ORDER — IOHEXOL 300 MG/ML  SOLN
100.0000 mL | Freq: Once | INTRAMUSCULAR | Status: AC | PRN
  Administered 2024-04-26: 100 mL via INTRAVENOUS

## 2024-04-26 MED ORDER — IOHEXOL 9 MG/ML PO SOLN
500.0000 mL | ORAL | Status: AC
  Administered 2024-04-26 (×2): 500 mL via ORAL

## 2024-04-27 ENCOUNTER — Telehealth: Payer: Self-pay

## 2024-04-27 ENCOUNTER — Telehealth: Payer: Self-pay | Admitting: Nurse Practitioner

## 2024-04-27 ENCOUNTER — Other Ambulatory Visit: Payer: Self-pay | Admitting: Nurse Practitioner

## 2024-04-27 MED ORDER — POTASSIUM CHLORIDE CRYS ER 10 MEQ PO TBCR: 10.0000 meq | EXTENDED_RELEASE_TABLET | Freq: Every day | ORAL | 0 refills | Status: DC

## 2024-04-27 NOTE — Telephone Encounter (Signed)
 Called and spoke with patient on the phone. Advised her that CBC was normal and CMP showed mild hypokalemia. Added D-DUR 10 mEq daily. Sent to [facility] on [address]. She did ask about results of CT CAP done yesterday. Her scan has not been read yet. She does have appointment on 05/03/2024 with me to go over the results. All questions were answered.  -Powell Lessen, NP

## 2024-04-27 NOTE — Telephone Encounter (Signed)
 Pt called wanting to speak with a provider regarding her recent lab results.  Stated this nurse will make Dr. Lanny and her team aware of the pt's call and request.  Stated someone from Dr. Demetra team will f/u w/pt.

## 2024-05-01 ENCOUNTER — Other Ambulatory Visit

## 2024-05-01 ENCOUNTER — Ambulatory Visit: Admitting: Nurse Practitioner

## 2024-05-01 DIAGNOSIS — Z79899 Other long term (current) drug therapy: Secondary | ICD-10-CM | POA: Diagnosis not present

## 2024-05-02 NOTE — Progress Notes (Signed)
 Patient Care Team: Leron Millman, NP as PCP - General (Nurse Practitioner) Sheldon Standing, MD as Consulting Physician (General Surgery) Burnette Fallow, MD as Consulting Physician (Gastroenterology) Lanny Callander, MD as Consulting Physician (Hematology and Oncology) Gordy Channing LABOR, RN as Registered Nurse Delene Thersia PARAS as Social Worker  Clinic Day:  05/03/2024  Referring physician: Leron Millman, NP  ASSESSMENT & PLAN:   Assessment & Plan: Cancer of right colon (HCC) -Stage IIA (pT3N0M0), MSS -Patient presented with lower GI bleeding -Status post right hemicolectomy -She is on cancer surveillance with routine lab including Signatera, and CT scan every 6-12 months for first 2-3 years  -12/14/2023 -surveillance colonoscopy per Dr. Burnette.  Prior end-to-side ileocolonic anastomosis in cecum and appendiceal orifice.  Anastomosis is patent and healthy appearing mucosa.  Surveillance colonoscopy due again in 11/2026. - 04/26/2024 - CT CAP showed no evidence of recurrence or metastatic disease in the chest, abdomen, or pelvis. Plan for labs and follow-up in 3 to 4 months, sooner if needed.  Plan to add Signatera to lab draw.   Hypokalemia Likely from decreased appetite and nutritional decline.  Admits she has not been taking her potassium supplement.  She will need to pick this up and take daily.  Recheck at next visit.  Right-sided colon cancer  Reviewed results of CT CAP from 04/26/2024.  Scan showed no lymphadenopathy or evidence of recurrence of metastatic disease in the chest, abdomen, or pelvis.  She does have very stable scarring in the right pulmonary apex.  This is likely secondary to history of smoking.  She had a colonoscopy with Dr. Burnette on 12/14/2023.  This showed prior end-to-side ileocolonic anastomosis in cecum and appendiceal orifice.  There was healthy-appearing mucosa.  She is due for surveillance colonoscopy in 11/2026.  Will continue colon cancer surveillance.  Plan Labs  reviewed. -Unremarkable CBC. - Mild hypokalemia with potassium at 3.3.  Recommend she pick up prescription for potassium 10 mEq daily.  This is already been sent to her pharmacy. Reviewed CT CAP from 04/26/2024 showing no evidence of lymphadenopathy, recurrence, or metastatic disease in the chest, abdomen, or pelvis.  Repeat scan in 6 to 12 months. Colonoscopy report received from Dr. Burnette, GI.  Due for surveillance colonoscopy in April 2028. Plan for labs and follow-up in 4 months, sooner if needed.    The patient understands the plans discussed today and is in agreement with them.  She knows to contact our office if she develops concerns prior to her next appointment.  I provided 25 minutes of face-to-face time during this encounter and > 50% was spent counseling as documented under my assessment and plan.    Powell FORBES Lessen, NP  [facility] - A DEPT OF [facility] [address] [place] [address] Dept: [phone] Dept Fax: [phone]   No orders of the defined types were placed in this encounter.     CHIEF COMPLAINT:  CC: Cancer of right colon  Current Treatment: Cancer surveillance  INTERVAL HISTORY:  Emily Cameron is here today for repeat clinical assessment.  She last saw Dr. Lanny on 11/02/2023.  Right hemicolectomy in March 2024.  CT CAP done 04/26/2024 showed no lymphadenopathy or evidence of metastases in the chest, abdomen, or pelvis.  She does have some scarring in the right pulmonary apex which is stable over multiple imaging studies.  She is a former smoker and scarring may be result of smoking history.  She  reports feeling well.  She denies chest pain, chest pressure, or shortness of breath. She denies headaches or visual disturbances. She denies abdominal pain, nausea, vomiting, or changes in bowel or bladder habits.  She denies fevers or chills. She denies pain. Her appetite is decreased since her surgery.  Her weight has decreased 9 pounds over last 6 months.  I have reviewed the past medical history, past surgical history, social history and family history with the patient and they are unchanged from previous note.  ALLERGIES:  has no known allergies.  MEDICATIONS:  Current Outpatient Medications  Medication Sig Dispense Refill   amitriptyline  (ELAVIL ) 50 MG tablet Take 50 mg by mouth at bedtime.     apixaban  (ELIQUIS ) 5 MG TABS tablet Take 1 tablet (5 mg total) by mouth 2 (two) times daily. Start after completion of apixaban  starter pack 60 tablet 2   ferrous sulfate  (FEROSUL) 325 (65 FE) MG tablet Take 325 mg by mouth daily with breakfast.     gabapentin  (NEURONTIN ) 600 MG tablet Take 1 tablet (600 mg total) by mouth 3 (three) times daily as needed (pain). (Patient taking differently: Take 600 mg by mouth in the morning, at noon, in the evening, and at bedtime.) 90 tablet 3   mirtazapine  (REMERON ) 30 MG tablet Take 1 tablet (30 mg total) by mouth at bedtime. 30 tablet 3   omeprazole  (PRILOSEC) 40 MG capsule Take 1 capsule (40 mg total) by mouth 2 (two) times daily as needed (acid reflux). 30 capsule 3   oxyCODONE -acetaminophen  (PERCOCET) 10-325 MG tablet Take 0.5-1 tablets by mouth every 6 (six) hours as needed for pain. 20 tablet 0   potassium chloride  (KLOR-CON  M) 10 MEQ tablet Take 1 tablet (10 mEq total) by mouth daily. 30 tablet 0   QUEtiapine  (SEROQUEL ) 200 MG tablet Take 1 tablet (200 mg total) by mouth at bedtime. 30 tablet 3   Tetrahydrozoline HCl (VISINE OP) Place 2 drops into both eyes as needed (for dry eyes). Reported on 09/09/2015     tiZANidine  (ZANAFLEX ) 4 MG capsule Take 4 mg by mouth 3 (three) times daily as needed for muscle spasms.     No current facility-administered medications for this visit.    HISTORY OF PRESENT ILLNESS:   Oncology History Overview Note   Cancer Staging  Cancer of right colon [facility]) Staging form: Colon and Rectum, AJCC 8th Edition - Pathologic  stage from 10/22/2022: Stage IIA (pT3, pN0, cM0) - Signed by Lanny Callander, MD on 11/07/2022 Stage prefix: Initial diagnosis Total positive nodes: 0 Histologic grading system: 4 grade system Histologic grade (G): G2 Residual tumor (R): R0 - None     Cancer of right colon (HCC)  09/08/2022 Imaging    IMPRESSION: 1. Circumferential mass in the transverse limb of the hepatic flexure consistent with colon cancer. Possible synchronous lesion versus artifact in the rectosigmoid junction. Surgical consultation and/or colonoscopy recommended for further evaluation. 2. No inflammatory changes or obstruction in the bowel. No evidence of acute diverticulitis.     10/05/2022 Initial Diagnosis   Cancer of right colon (HCC)   10/06/2022 Imaging    IMPRESSION: 1. Stable CT of the chest. No specific findings identified to suggest metastatic disease to the chest. 2. Unchanged appearance of scarring, architectural distortion, and volume loss within the lateral left base and apical segment of the right upper lobe. 3. Stable appearance of small right upper lobe lung nodules which are likely postinflammatory in etiology. 4. Coronary artery calcifications. 5. Aortic Atherosclerosis (ICD10-I70.0)  and Emphysema (ICD10-J43.9).   10/22/2022 Cancer Staging   Staging form: Colon and Rectum, AJCC 8th Edition - Pathologic stage from 10/22/2022: Stage IIA (pT3, pN0, cM0) - Signed by Lanny Callander, MD on 11/07/2022 Stage prefix: Initial diagnosis Total positive nodes: 0 Histologic grading system: 4 grade system Histologic grade (G): G2 Residual tumor (R): R0 - None   10/22/2022 Pathology Results    FINAL MICROSCOPIC DIAGNOSIS:  A. COLON, PROXIMAL RIGHT, RESECTION: Invasive colonic adenocarcinoma, 6 cm Carcinoma extends into pericolonic connective tissue (pT3) All surgical margins negative for carcinoma Thirty-nine lymph nodes negative for metastatic carcinoma (0/39) (pN0) Benign appendix See oncology  table  ONCOLOGY TABLE: COLON AND RECTUM, CARCINOMA:  Resection Procedure: Right colectomy Tumor Site: Proximal transverse colon Tumor Size: 6 x 4.5 cm Macroscopic Tumor Perforation: Not identified Histologic Type: Colorectal adenocarcinoma, not otherwise specified Histologic Grade: Moderately differentiated, G2 Multiple Primary Sites: Not applicable Tumor Extension: Into pericolonic connective tissue Lymphovascular Invasion: Not identified Perineural Invasion: Not identified Treatment Effect: No known presurgical therapy Margins:      Margin Status for Invasive Carcinoma: All margins negative for carcinoma Regional Lymph Nodes:      Number of Lymph Nodes with Tumor: 0      Number of Lymph Nodes Examined: 39 Tumor Deposits: Not identified Distant Metastasis:      Distant Site(s) Involved: Not applicable Pathologic Stage Classification (pTNM, AJCC 8th Edition): pT3, pN0 Ancillary Studies: MMR and MSI are pending Representative Tumor Block: A3-A7 (v4.2.0.1)    12/14/2023 Procedure   Colonoscopy  with Dr. Burnette Evidence of prior end-to-side ileocolonic anastomosis in the cecum and appendiceal orifice.  Anastomosis is patent with healthy-appearing mucosa.  Surveillance colonoscopy in 11/2026.    04/26/2024 Imaging   CT CAP with contrast  IMPRESSION: 1. Status post partial right hemicolectomy. 2. No evidence of lymphadenopathy or metastatic disease in the chest, abdomen, or pelvis. 3. Irregular scarring and architectural distortion of the right pulmonary apex, including a spiculated appearing nodule measuring 0.9 cm. Given well established stability this is most likely benign and incidental sequelae of infection or inflammation. Attention on follow-up. Additional bandlike scarring of the left lung base. 4. Diffuse bilateral bronchial wall thickening and a background of minimal paraseptal emphysema with fine centrilobular nodularity throughout the lungs, consistent with smoking related  respiratory bronchiolitis. 5. Coronary artery disease. 6. Nonobstructive right nephrolithiasis.       REVIEW OF SYSTEMS:   Constitutional: Denies fevers or chills.  Decreased appetite.  Unintentional weight loss of 9 pounds over last 6 months. Eyes: Denies blurriness of vision Ears, nose, mouth, throat, and face: Denies mucositis or sore throat Respiratory: Denies cough, dyspnea or wheezes Cardiovascular: Denies palpitation, chest discomfort or lower extremity swelling Gastrointestinal:  Denies nausea, heartburn or change in bowel habits Skin: Denies abnormal skin rashes Lymphatics: Denies new lymphadenopathy or easy bruising Neurological:Denies numbness, tingling or new weaknesses Behavioral/Psych: Mood is stable, no new changes  All other systems were reviewed with the patient and are negative.   VITALS:   Today's Vitals   05/03/24 1407  BP: (!) 135/93  Pulse: 78  Resp: 14  Temp: 97.7 F (36.5 C)  TempSrc: Temporal  SpO2: 100%  Weight: 124 lb (56.2 kg)   Body mass index is 21.28 kg/m.   Wt Readings from Last 3 Encounters:  05/12/24 125 lb (56.7 kg)  05/03/24 124 lb (56.2 kg)  11/02/23 135 lb 8 oz (61.5 kg)    Body mass index is 21.28 kg/m.  Performance status (ECOG):  1 - Symptomatic but completely ambulatory  PHYSICAL EXAM:   GENERAL:alert, no distress and comfortable SKIN: skin color, texture, turgor are normal, no rashes or significant lesions EYES: normal, Conjunctiva are pink and non-injected, sclera clear OROPHARYNX:no exudate, no erythema and lips, buccal mucosa, and tongue normal  NECK: supple, thyroid  normal size, non-tender, without nodularity LYMPH:  no palpable lymphadenopathy in the cervical, axillary or inguinal LUNGS: clear to auscultation and percussion with normal breathing effort HEART: regular rate & rhythm and no murmurs and no lower extremity edema ABDOMEN:abdomen soft, non-tender and normal bowel sounds Musculoskeletal:no cyanosis of  digits and no clubbing  NEURO: alert & oriented x 3 with fluent speech, no focal motor/sensory deficits  LABORATORY DATA:  I have reviewed the data as listed    Component Value Date/Time   NA 142 05/12/2024 1833   NA 142 05/11/2018 1529   K 2.9 (L) 05/12/2024 1833   CL 105 05/12/2024 1833   CO2 28 04/26/2024 1017   GLUCOSE 83 05/12/2024 1833   BUN 3 (L) 05/12/2024 1833   BUN 10 05/11/2018 1529   CREATININE 0.80 05/12/2024 1833   CREATININE 0.73 04/26/2024 1017   CREATININE 0.68 09/12/2014 1240   CALCIUM  9.2 04/26/2024 1017   PROT 7.8 04/26/2024 1017   PROT 7.1 05/11/2018 1529   ALBUMIN 4.0 04/26/2024 1017   ALBUMIN 4.2 05/11/2018 1529   AST 13 (L) 04/26/2024 1017   ALT 13 04/26/2024 1017   ALKPHOS 73 04/26/2024 1017   BILITOT 0.6 04/26/2024 1017   GFRNONAA >60 04/26/2024 1017   GFRNONAA >89 09/24/2013 1500   GFRAA 112 05/11/2018 1529   GFRAA >89 09/24/2013 1500    Lab Results  Component Value Date   WBC 8.5 05/12/2024   NEUTROABS 6.1 05/12/2024   HGB 13.6 05/12/2024   HCT 40.0 05/12/2024   MCV 92.8 05/12/2024   PLT 204 05/12/2024    RADIOGRAPHIC STUDIES: DG Chest Port 1 View Result Date: 05/12/2024 EXAM: 1 VIEW XRAY OF THE CHEST 05/12/2024 07:44:22 PM COMPARISON: CT chest 04/26/2024 CLINICAL HISTORY: Cough and URI symptoms. FINDINGS: LUNGS AND PLEURA: No focal pulmonary opacity. No pulmonary edema. No pleural effusion. No pneumothorax. HEART AND MEDIASTINUM: No acute abnormality of the cardiac and mediastinal silhouettes. Aortic arch atherosclerosis. BONES AND SOFT TISSUES: No acute osseous abnormality. IMPRESSION: 1. No acute abnormalities. Electronically signed by: Pinkie Pebbles MD 05/12/2024 07:51 PM EDT RP Workstation: [id number]   CT CHEST ABDOMEN PELVIS W CONTRAST Result Date: 04/29/2024 CLINICAL DATA:  Right colon cancer surveillance, status post partial colectomy * Tracking Code: BO * EXAM: CT CHEST, ABDOMEN, AND PELVIS WITH CONTRAST TECHNIQUE: Multidetector  CT imaging of the chest, abdomen and pelvis was performed following the standard protocol during bolus administration of intravenous contrast. RADIATION DOSE REDUCTION: This exam was performed according to the departmental dose-optimization program which includes automated exposure control, adjustment of the mA and/or kV according to patient size and/or use of iterative reconstruction technique. CONTRAST:  OMNIPAQUE  IOHEXOL  300 MG/ML SOLN additional oral enteric contrast COMPARISON:  CT chest, 10/06/2022, CT abdomen pelvis, 09/08/2022 FINDINGS: CT CHEST FINDINGS Cardiovascular: Scattered aortic atherosclerosis. Normal heart size. Scattered left coronary artery calcifications. No pericardial effusion. Mediastinum/Nodes: No enlarged mediastinal, hilar, or axillary lymph nodes. Thyroid  gland, trachea, and esophagus demonstrate no significant findings. Lungs/Pleura: Unchanged appearance of the chest. Irregular scarring and architectural distortion of the right pulmonary apex, including a spiculated appearing nodule measuring 0.9 cm (series 6, image 27). Additional bandlike scarring in the left lung base (series  6, image 110). Diffuse bilateral bronchial wall thickening and a background of minimal paraseptal emphysema with fine centrilobular nodularity throughout the lungs. No pleural effusion or pneumothorax. Musculoskeletal: No chest wall abnormality. No acute osseous findings. CT ABDOMEN PELVIS FINDINGS Hepatobiliary: No solid liver abnormality is seen. Unchanged tiny benign liver cysts requiring no further follow-up or characterization. No gallstones, gallbladder wall thickening, or biliary dilatation. Pancreas: Unremarkable. No pancreatic ductal dilatation or surrounding inflammatory changes. Spleen: Normal in size without significant abnormality. Adrenals/Urinary Tract: Adrenal glands are unremarkable. Multiple tiny nonobstructive calculi in the inferior pole of the right kidney. No left-sided calculi  ureteral calculi, or hydronephrosis. Bladder is unremarkable. Stomach/Bowel: Stomach is within normal limits. Status post partial right hemicolectomy. No evidence of bowel wall thickening, distention, or inflammatory changes. Vascular/Lymphatic: Aortic atherosclerosis. No enlarged abdominal or pelvic lymph nodes. Reproductive: Hysterectomy. Other: No abdominal wall hernia or abnormality. No ascites. Musculoskeletal: No acute osseous findings. IMPRESSION: 1. Status post partial right hemicolectomy. 2. No evidence of lymphadenopathy or metastatic disease in the chest, abdomen, or pelvis. 3. Irregular scarring and architectural distortion of the right pulmonary apex, including a spiculated appearing nodule measuring 0.9 cm. Given well established stability this is most likely benign and incidental sequelae of infection or inflammation. Attention on follow-up. Additional bandlike scarring of the left lung base. 4. Diffuse bilateral bronchial wall thickening and a background of minimal paraseptal emphysema with fine centrilobular nodularity throughout the lungs, consistent with smoking related respiratory bronchiolitis. 5. Coronary artery disease. 6. Nonobstructive right nephrolithiasis. Aortic Atherosclerosis (ICD10-I70.0) and Emphysema (ICD10-J43.9). Electronically Signed   By: Marolyn JONETTA Jaksch M.D.   On: 04/29/2024 08:59

## 2024-05-02 NOTE — Assessment & Plan Note (Addendum)
-  Stage IIA (pT3N0M0), MSS -Patient presented with lower GI bleeding -Status post right hemicolectomy -She is on cancer surveillance with routine lab including Signatera, and CT scan every 6-12 months for first 2-3 years  -12/14/2023 -surveillance colonoscopy per Dr. Burnette.  Prior end-to-side ileocolonic anastomosis in cecum and appendiceal orifice.  Anastomosis is patent and healthy appearing mucosa.  Surveillance colonoscopy due again in 11/2026. - 04/26/2024 - CT CAP showed no evidence of recurrence or metastatic disease in the chest, abdomen, or pelvis. Plan for labs and follow-up in 3 to 4 months, sooner if needed.  Plan to add Signatera to lab draw.

## 2024-05-03 ENCOUNTER — Inpatient Hospital Stay (HOSPITAL_BASED_OUTPATIENT_CLINIC_OR_DEPARTMENT_OTHER): Admitting: Nurse Practitioner

## 2024-05-03 ENCOUNTER — Other Ambulatory Visit: Payer: Self-pay

## 2024-05-03 VITALS — BP 135/93 | HR 78 | Temp 97.7°F | Resp 14 | Wt 124.0 lb

## 2024-05-03 DIAGNOSIS — C182 Malignant neoplasm of ascending colon: Secondary | ICD-10-CM

## 2024-05-03 DIAGNOSIS — Z85038 Personal history of other malignant neoplasm of large intestine: Secondary | ICD-10-CM | POA: Diagnosis not present

## 2024-05-03 DIAGNOSIS — Z79899 Other long term (current) drug therapy: Secondary | ICD-10-CM | POA: Diagnosis not present

## 2024-05-03 MED ORDER — APIXABAN 5 MG PO TABS: 5.0000 mg | ORAL_TABLET | Freq: Two times a day (BID) | ORAL | 2 refills | Status: AC

## 2024-05-07 ENCOUNTER — Telehealth: Payer: Self-pay

## 2024-05-07 NOTE — Telephone Encounter (Addendum)
 Contacted Dr. Elsie Cree, GI office. LVM w/ Medical Records requesting patient's recent colonoscopy report to be faxed to [phone].  Also, faxed request to [phone]. Confirmation received. Awaiting report.  ----- Message from Lacie K Burton sent at 05/07/2024  2:12 PM EDT ----- Please see Dr. Dolan msg below. thx ----- Message ----- From: Cree Elsie, MD Sent: 05/06/2024   1:26 PM EDT To: Onita Mattock, MD  She had colonoscopy December 14, 2023 in our office, next one is due in April 2028.  Sorry if you didn't get report; you can call our office [phone]) and ask to speak to Tiffany (my assistant) and she can fax you a copy.  Thanks.  Emily Cameron ----- Message ----- From: Mattock Onita, MD Sent: 05/03/2024   2:22 PM EDT To: Elsie Cree, MD; Powell FORBES Lessen, NP  Dr. Cree,  She is overdue for colonoscopy, could you get her in?  Thx  Onita

## 2024-05-10 ENCOUNTER — Telehealth: Payer: Self-pay

## 2024-05-10 ENCOUNTER — Encounter: Payer: Self-pay | Admitting: Gastroenterology

## 2024-05-10 DIAGNOSIS — C182 Malignant neoplasm of ascending colon: Secondary | ICD-10-CM

## 2024-05-10 NOTE — Progress Notes (Signed)
 Complex Care Management Note  Care Guide Note 05/10/2024 Name: Emily Cameron MRN: [id number] DOB: 1961-05-12  Emily Cameron is a 63 y.o. year old female who sees Leron Millman, NP for primary care. I reached out to Emily Cameron by phone today to offer complex care management services.  Emily Cameron was given information about Complex Care Management services today including:   The Complex Care Management services include support from the care team which includes your Nurse Care Manager, Clinical Social Worker, or Pharmacist.  The Complex Care Management team is here to help remove barriers to the health concerns and goals most important to you. Complex Care Management services are voluntary, and the patient may decline or stop services at any time by request to their care team member.   Complex Care Management Consent Status: Patient agreed to services and verbal consent obtained.   Follow up plan:  Telephone appointment with complex care management team member scheduled for:  05/30/24 at 11:30 a.m.   Encounter Outcome:  Patient Scheduled  [facility]  [facility], [facility] VBCI Assistant Direct Dial: [phone]  Fax: [phone]

## 2024-05-12 ENCOUNTER — Other Ambulatory Visit: Payer: Self-pay

## 2024-05-12 ENCOUNTER — Emergency Department (HOSPITAL_COMMUNITY)

## 2024-05-12 ENCOUNTER — Encounter (HOSPITAL_COMMUNITY): Payer: Self-pay

## 2024-05-12 ENCOUNTER — Emergency Department (HOSPITAL_COMMUNITY)
Admission: EM | Admit: 2024-05-12 | Discharge: 2024-05-12 | Disposition: A | Discharge status: Home / Self Care | Attending: Emergency Medicine | Admitting: Emergency Medicine

## 2024-05-12 DIAGNOSIS — E876 Hypokalemia: Secondary | ICD-10-CM | POA: Insufficient documentation

## 2024-05-12 DIAGNOSIS — J449 Chronic obstructive pulmonary disease, unspecified: Secondary | ICD-10-CM | POA: Diagnosis not present

## 2024-05-12 DIAGNOSIS — Z7901 Long term (current) use of anticoagulants: Secondary | ICD-10-CM | POA: Insufficient documentation

## 2024-05-12 DIAGNOSIS — I491 Atrial premature depolarization: Secondary | ICD-10-CM | POA: Diagnosis not present

## 2024-05-12 DIAGNOSIS — Z85038 Personal history of other malignant neoplasm of large intestine: Secondary | ICD-10-CM | POA: Diagnosis not present

## 2024-05-12 DIAGNOSIS — R519 Headache, unspecified: Secondary | ICD-10-CM | POA: Insufficient documentation

## 2024-05-12 DIAGNOSIS — R079 Chest pain, unspecified: Secondary | ICD-10-CM | POA: Diagnosis present

## 2024-05-12 DIAGNOSIS — R0789 Other chest pain: Secondary | ICD-10-CM | POA: Diagnosis not present

## 2024-05-12 DIAGNOSIS — F17200 Nicotine dependence, unspecified, uncomplicated: Secondary | ICD-10-CM | POA: Diagnosis not present

## 2024-05-12 DIAGNOSIS — I700 Atherosclerosis of aorta: Secondary | ICD-10-CM | POA: Diagnosis not present

## 2024-05-12 DIAGNOSIS — R109 Unspecified abdominal pain: Secondary | ICD-10-CM | POA: Insufficient documentation

## 2024-05-12 DIAGNOSIS — R059 Cough, unspecified: Secondary | ICD-10-CM | POA: Diagnosis not present

## 2024-05-12 LAB — CBC WITH DIFFERENTIAL/PLATELET
Abs Immature Granulocytes: 0.02 K/uL (ref 0.00–0.07)
Basophils Absolute: 0 K/uL (ref 0.0–0.1)
Basophils Relative: 0 %
Eosinophils Absolute: 0.1 K/uL (ref 0.0–0.5)
Eosinophils Relative: 1 %
HCT: 36 % (ref 36.0–46.0)
Hemoglobin: 12.6 g/dL (ref 12.0–15.0)
Immature Granulocytes: 0 %
Lymphocytes Relative: 18 %
Lymphs Abs: 1.5 K/uL (ref 0.7–4.0)
MCH: 32.5 pg (ref 26.0–34.0)
MCHC: 35 g/dL (ref 30.0–36.0)
MCV: 92.8 fL (ref 80.0–100.0)
Monocytes Absolute: 0.8 K/uL (ref 0.1–1.0)
Monocytes Relative: 9 %
Neutro Abs: 6.1 K/uL (ref 1.7–7.7)
Neutrophils Relative %: 72 %
Platelets: 204 K/uL (ref 150–400)
RBC: 3.88 MIL/uL (ref 3.87–5.11)
RDW: 14.9 % (ref 11.5–15.5)
WBC: 8.5 K/uL (ref 4.0–10.5)
nRBC: 0 % (ref 0.0–0.2)

## 2024-05-12 LAB — I-STAT CHEM 8, ED
BUN: 3 mg/dL — ABNORMAL LOW (ref 6–20)
Calcium, Ion: 1.1 mmol/L — ABNORMAL LOW (ref 1.15–1.40)
Chloride: 105 mmol/L (ref 98–111)
Creatinine, Ser: 0.8 mg/dL (ref 0.44–1.00)
Glucose, Bld: 83 mg/dL (ref 70–99)
HCT: 40 % (ref 36.0–46.0)
Hemoglobin: 13.6 g/dL (ref 12.0–15.0)
Potassium: 2.9 mmol/L — ABNORMAL LOW (ref 3.5–5.1)
Sodium: 142 mmol/L (ref 135–145)
TCO2: 23 mmol/L (ref 22–32)

## 2024-05-12 LAB — RESP PANEL BY RT-PCR (RSV, FLU A&B, COVID)  RVPGX2
Influenza A by PCR: NEGATIVE
Influenza B by PCR: NEGATIVE
Resp Syncytial Virus by PCR: NEGATIVE
SARS Coronavirus 2 by RT PCR: NEGATIVE

## 2024-05-12 LAB — TROPONIN I (HIGH SENSITIVITY)
Troponin I (High Sensitivity): 6 ng/L (ref <18)
Troponin I (High Sensitivity): 5 ng/L

## 2024-05-12 MED ORDER — ACETAMINOPHEN 500 MG PO TABS
1000.0000 mg | ORAL_TABLET | Freq: Once | ORAL | Status: AC
  Administered 2024-05-12: 1000 mg via ORAL
  Filled 2024-05-12: qty 2

## 2024-05-12 MED ORDER — APIXABAN 5 MG PO TABS
5.0000 mg | ORAL_TABLET | Freq: Once | ORAL | Status: AC
  Administered 2024-05-12: 5 mg via ORAL
  Filled 2024-05-12: qty 1

## 2024-05-12 MED ORDER — POTASSIUM CHLORIDE CRYS ER 20 MEQ PO TBCR
40.0000 meq | EXTENDED_RELEASE_TABLET | Freq: Once | ORAL | Status: AC
  Administered 2024-05-12: 40 meq via ORAL
  Filled 2024-05-12: qty 2

## 2024-05-12 NOTE — ED Triage Notes (Signed)
 Patient arrived by [facility] from home with multiple complaints. Patient complains of general headache, abdominal pain and cp. Patient reports that she has hx of PE and COPD and kidney stones. Patient did receive 1 sl ntg from ems with some relief and 4 baby asa. PIV prior to arrival

## 2024-05-12 NOTE — Discharge Instructions (Addendum)
 All the blood work, EKG and x-ray looked okay today except that your potassium was low.  Make sure you are eating foods with potassium in them.  It is important that you going get your prescription tomorrow and continue your blood thinners and your other medications.  Take Tylenol  as needed for pain.  Return if you have any trouble breathing, passing out or other concerns like persistent chest pain.

## 2024-05-12 NOTE — ED Notes (Signed)
Patient able to ambulate to restroom independently.

## 2024-05-12 NOTE — ED Provider Notes (Signed)
 Emily Cameron   CSN: [id number] Arrival date & time: 05/12/24  1730     Patient presents with: cp/abd pain   Emily Cameron is a 63 y.o. female.   Patient is a 63 year old female with a history of prior PE, polysubstance use, alcohol abuse, colon cancer currently without signs of metastatic disease, COPD, GERD who admits to still smoking and not always using her inhalers the way she is supposed to presenting today with complaints of chest and abdominal pain.  Patient reports that she slept on the floor last night and when she woke up she just did not feel well.  She went to the store and reports on her way back she had a headache, some sharp chest pain and some abdominal pain.  She took a gabapentin  and reports that EMS gave her nitroglycerin and some aspirin and now feels better.  Reports her abdominal pain is gone she is not having any chest pain or shortness of breath at this time.  She does report that she has some good days and bad days.  Last week she had some diarrhea but reports that has resolved and her last bowel movement was today and was normal.  She has not had a fever and denies any productive cough.  She has no known cardiac disease.  The history is provided by the patient, the EMS personnel and medical records.       Prior to Admission medications   Medication Sig Start Date End Date Taking? Authorizing Provider  amitriptyline  (ELAVIL ) 50 MG tablet Take 50 mg by mouth at bedtime.    [provider]  apixaban  (ELIQUIS ) 5 MG TABS tablet Take 1 tablet (5 mg total) by mouth 2 (two) times daily. Start after completion of apixaban  starter pack 05/03/24   Boscia, Powell BRAVO, NP  ferrous sulfate  (FEROSUL) 325 (65 FE) MG tablet Take 325 mg by mouth daily with breakfast.    [provider]  gabapentin  (NEURONTIN ) 600 MG tablet Take 1 tablet (600 mg total) by mouth 3 (three) times daily as needed (pain). Patient  taking differently: Take 600 mg by mouth in the morning, at noon, in the evening, and at bedtime. 03/25/23   Hanford Powell BRAVO, NP  mirtazapine  (REMERON ) 30 MG tablet Take 1 tablet (30 mg total) by mouth at bedtime. 03/25/23   Hanford Powell BRAVO, NP  omeprazole  (PRILOSEC) 40 MG capsule Take 1 capsule (40 mg total) by mouth 2 (two) times daily as needed (acid reflux). 03/25/23   Boscia, Heather E, NP  oxyCODONE -acetaminophen  (PERCOCET) 10-325 MG tablet Take 0.5-1 tablets by mouth every 6 (six) hours as needed for pain. 10/24/22   Sheldon Standing, MD  potassium chloride  (KLOR-CON  M) 10 MEQ tablet Take 1 tablet (10 mEq total) by mouth daily. 04/27/24   Boscia, Heather E, NP  QUEtiapine  (SEROQUEL ) 200 MG tablet Take 1 tablet (200 mg total) by mouth at bedtime. 03/25/23   Boscia, Heather E, NP  Tetrahydrozoline HCl (VISINE OP) Place 2 drops into both eyes as needed (for dry eyes). Reported on 09/09/2015    [provider]  tiZANidine  (ZANAFLEX ) 4 MG capsule Take 4 mg by mouth 3 (three) times daily as needed for muscle spasms.    [provider]    Allergies: Patient has no known allergies.    Review of Systems  Updated Vital Signs BP 135/80   Pulse 71   Temp 99.5 F (37.5 C) (Oral)  Resp (!) 22   Ht 5' 4 (1.626 m)   Wt 56.7 kg   LMP 01/26/2004   SpO2 99%   BMI 21.46 kg/m   Physical Exam Vitals and nursing Cameron reviewed.  Constitutional:      General: She is not in acute distress.    Appearance: She is well-developed.  HENT:     Head: Normocephalic and atraumatic.  Eyes:     Pupils: Pupils are equal, round, and reactive to light.  Cardiovascular:     Rate and Rhythm: Normal rate and regular rhythm.     Heart sounds: Normal heart sounds. No murmur heard.    No friction rub.  Pulmonary:     Effort: Pulmonary effort is normal.     Breath sounds: Normal breath sounds. No wheezing or rales.  Chest:     Chest wall: No tenderness.  Abdominal:     General: Bowel sounds are normal.  There is no distension.     Palpations: Abdomen is soft. There is no mass.     Tenderness: There is no abdominal tenderness. There is no right CVA tenderness, left CVA tenderness, guarding or rebound.  Musculoskeletal:        General: No tenderness. Normal range of motion.     Right lower leg: No edema.     Left lower leg: No edema.     Comments: No edema  Skin:    General: Skin is warm and dry.     Findings: No rash.  Neurological:     Mental Status: She is alert and oriented to person, place, and time. Mental status is at baseline.     Cranial Nerves: No cranial nerve deficit, dysarthria or facial asymmetry.     Sensory: Sensation is intact.     Motor: Motor function is intact.  Psychiatric:        Behavior: Behavior normal.     (all labs ordered are listed, but only abnormal results are displayed) Labs Reviewed  I-STAT CHEM 8, ED - Abnormal; Notable for the following components:      Result Value   Potassium 2.9 (*)    BUN 3 (*)    Calcium , Ion 1.10 (*)    All other components within normal limits  RESP PANEL BY RT-PCR (RSV, FLU A&B, COVID)  RVPGX2  CBC WITH DIFFERENTIAL/PLATELET  TROPONIN I (HIGH SENSITIVITY)  TROPONIN I (HIGH SENSITIVITY)    EKG: EKG Interpretation Date/Time:  Saturday May 12 2024 17:42:16 EDT Ventricular Rate:  74 PR Interval:  174 QRS Duration:  100 QT Interval:  398 QTC Calculation: 442 R Axis:   -56  Text Interpretation: Sinus rhythm Left anterior fascicular block LVH with secondary repolarization abnormality Anterior Q waves, possibly due to LVH No significant change since last tracing Confirmed by Doretha Folks ([id number]) on 05/12/2024 6:12:38 PM  Radiology: [facility] Chest Port 1 View Result Date: 05/12/2024 EXAM: 1 VIEW XRAY OF THE CHEST 05/12/2024 07:44:22 PM COMPARISON: CT chest 04/26/2024 CLINICAL HISTORY: Cough and URI symptoms. FINDINGS: LUNGS AND PLEURA: No focal pulmonary opacity. No pulmonary edema. No pleural effusion. No  pneumothorax. HEART AND MEDIASTINUM: No acute abnormality of the cardiac and mediastinal silhouettes. Aortic arch atherosclerosis. BONES AND SOFT TISSUES: No acute osseous abnormality. IMPRESSION: 1. No acute abnormalities. Electronically signed by: Pinkie Pebbles MD 05/12/2024 07:51 PM EDT RP Workstation: [id number]     Procedures   Medications Ordered in the ED  apixaban  (ELIQUIS ) tablet 5 mg (has no administration in time range)  potassium  chloride SA (KLOR-CON  M) CR tablet 40 mEq (40 mEq Oral Given 05/12/24 1939)  acetaminophen  (TYLENOL ) tablet 1,000 mg (1,000 mg Oral Given 05/12/24 2034)                                    Medical Decision Making Amount and/or Complexity of Data Reviewed Independent Historian: EMS External Data Reviewed: notes. Labs: ordered. Decision-making details documented in ED Course. Radiology: ordered and independent interpretation performed. Decision-making details documented in ED Course. ECG/medicine tests: ordered and independent interpretation performed. Decision-making details documented in ED Course.  Risk OTC drugs. Prescription drug management.   Pt with multiple medical problems and comorbidities and presenting today with a complaint that caries a high risk for morbidity and mortality.  Here today with initial complaint of headache, chest and abdominal pain.  Patient reports now she is feeling better.  She does have significant medical problems and prior history of colon cancer with recent CT done within the last week that shows no signs of recurrence of cancer mets with low suspicion for that is the cause of her pain today.  She has no abdominal pain currently and low suspicion for obstruction.  She has no symptoms classic for kidney stone.  Patient did have some stones in her kidneys which were seen on recent CT but only on the right side and she only complained of having pain earlier on the left side.  Concern for possible COVID.  Patient denies  any symptoms concerning for pneumonia at this time and on exam has no wheezing or findings of COPD exacerbation.  Low suspicion the patient's symptoms today were caused by PE.  Will do EKG and labs to ensure no evidence of ACS as patient does have risk factors.  I independently interpreted patient's EKG which shows no acute findings.  Labs and imaging are pending.  9:53 PM I independently interpreted patient's labs and viral panel is negative, troponin x 2 is normal, Chem-8 with a hypokalemia with potassium 2.9 today otherwise normal sodium and creatinine.  CBC within normal limits.  I have independently visualized and interpreted pt's images today.  Chest x-ray is normal today.  Patient given oral supplement of potassium.  She was able to eat here and had no recurrence of abdominal pain.  Patient has not taken her Eliquis  in 3 weeks because she reports that she has the prescription but she just has not picked it up.  She was given a dose here but low suspicion at this time for new PEs.  She is denying any chest pain at this time.  She is sleeping and has eaten without any difficulty.  At this time feel that patient is stable for discharge.  She has follow-up already planned.       Final diagnoses:  Nonspecific chest pain  Acute intractable headache, unspecified headache type    ED Discharge Orders     None          Doretha Folks, MD 05/12/24 2153

## 2024-05-14 ENCOUNTER — Telehealth: Payer: Self-pay

## 2024-05-14 ENCOUNTER — Encounter: Payer: Self-pay | Admitting: Nurse Practitioner

## 2024-05-14 NOTE — Progress Notes (Signed)
 Complex Care Management Care Guide Note  05/14/2024 Name: Emily Cameron MRN: [id number] DOB: [birth year]  Emily Cameron is a 63 y.o. year old female who is a primary care patient of Leron Millman, NP and is actively engaged with the care management team. I reached out to Emily Cameron by phone today to assist with re-scheduling  with the RN Case Manager.  Follow up plan: Telephone appointment with complex care management team member scheduled for:  05/21/24 at 2:15 p.m.   Emily Cameron [facility]  [facility], [facility] VBCI Assistant Direct Dial: [phone]  Fax: [phone]

## 2024-05-21 ENCOUNTER — Other Ambulatory Visit: Payer: Self-pay | Admitting: *Deleted

## 2024-05-21 NOTE — Patient Outreach (Signed)
 Complex Care Management   Visit Note  05/21/2024  Name:  Emily Cameron MRN: [id number] DOB: 12-Sep-1960  Situation: Referral received for Complex Care Management related to SDOH Barriers:  Food insecurity Utility insecurity I obtained verbal consent from Patient.  Visit completed with Parent  on the phone  Background:   Past Medical History:  Diagnosis Date   Alcohol abuse    Anemia    Anxiety    Arthritis    Cancer of hepatic flexure (HCC)    COPD (chronic obstructive pulmonary disease) (HCC)    Depression    DVT (deep venous thrombosis) (HCC) 12/24/2013   GERD (gastroesophageal reflux disease)    History of kidney stones    Hypertension    Insomnia    Other pulmonary embolism without acute cor pulmonale (HCC) 03/05/2011   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 Marijuna not taking since 7 month.    Sleep apnea    Weight loss    due to anxiety    Assessment:  Outreached completed today.  Emily Cameron informs me that she is currently living our of her husband's car.  She reports that she lives in an apartment but the electricity is off.  She reports that the power bill is in her grandson's name.  She reports that the grandson is out of town and she attempted to reach out to him to obtain assistance with getting an extension on the power bill, but she has been unable to reach him.  She can not complete the call today as her phone has very little charge on it.  I have made a BSW referral for 05/23/24 for 11 am to assist with the immediate need.   I will follow back up with patient in week.  Patient will is going to try and charge her phone to be able to speak with Child psychotherapist.  Patient Reported Symptoms:  Cognitive        Neurological      HEENT        Cardiovascular       Respiratory      Endocrine      Gastrointestinal        Genitourinary      Integumentary      Musculoskeletal          Psychosocial       Quality of Family Relationships: helpful, involved, supportive    05/21/2024    PHQ2-9 Depression Screening   Little interest or pleasure in doing things    Feeling down, depressed, or hopeless    PHQ-2 - Total Score    Trouble falling or staying asleep, or sleeping too much    Feeling tired or having little energy    Poor appetite or overeating     Feeling bad about yourself - or that you are a failure or have let yourself or your family down    Trouble concentrating on things, such as reading the newspaper or watching television    Moving or speaking so slowly that other people could have noticed.  Or the opposite - being so fidgety or restless that you have been moving around a lot more than usual    Thoughts that you would be better off dead, or hurting yourself in some  way    PHQ2-9 Total Score    If you checked off any problems, how difficult have these problems made it for you to do your work, take care of things at home, or get along with other people    Depression Interventions/Treatment      There were no vitals filed for this visit.  Medications Reviewed Today   Medications were not reviewed in this encounter     Recommendation:   Follow up with Social Worker  Follow Up Plan:   Telephone follow-up : 05/29/24 @ 9:15 am  Emily Meditz, RN, BSN, [employer] RN Care Manager [employer] [phone]

## 2024-05-21 NOTE — Patient Instructions (Signed)
 Visit Information  Emily Cameron was given information about Medicaid Managed Care team care coordination services as a part of their [facility] Community Plan Medicaid benefit.   If you would like to schedule transportation through your [facility], please call the following number at least 2 days in advance of your appointment: [phone]   Rides for urgent appointments can also be made after hours by calling Member Services.  Call the Behavioral Health Crisis Line at [phone], at any time, 24 hours a day, 7 days a week. If you are in danger or need immediate medical attention call 911.   Follow up with Child psychotherapist for assistance with Visual merchandiser.    Telephone follow up appointment with Managed Medicaid care management team member scheduled for:  05/29/24 @ 9:15 am  Lavante Toso, RN, BSN, ACM RN Care Manager [employer] [phone]

## 2024-05-23 ENCOUNTER — Other Ambulatory Visit: Payer: Self-pay

## 2024-05-23 NOTE — Patient Instructions (Signed)
 Visit Information  Thank you for taking time to visit with me today. Please don't hesitate to contact me if I can be of assistance to you before our next scheduled appointment.  Our next appointment is no further scheduled appointments.   Please call the care guide team at [phone] if you need to cancel or reschedule your appointment.   Please call the Suicide and Crisis Lifeline: 988 call the [place]  National Suicide Prevention Lifeline: 920-258-5540 or TTY: 8651411891 TTY (802) 001-7399) to talk to a trained counselor call 1-800-273-TALK (toll free, 24 hour hotline) go to [facility] [address] [place] [phone]) call 911 if you are experiencing a Mental Health or Behavioral Health Crisis or need someone to talk to.  Patient verbalizes understanding of instructions and care plan provided today and agrees to view in MyChart. Active MyChart status and patient understanding of how to access instructions and care plan via MyChart confirmed with patient.     Orlean Fey, BSW [place]  Value Based Care Institute Social Worker, [employer] [facility] [phone]

## 2024-05-23 NOTE — Patient Outreach (Signed)
 Complex Care Management   Visit Note  05/23/2024  Name:  Emily Cameron MRN: [id number] DOB: 10/22/1960  Situation: Referral received for Complex Care Management related to SDOH Barriers:  Transportation Housing   Food insecurity Financial Resource Strain I obtained verbal consent from Patient.  Visit completed with Patient  on the phone  Background:   Past Medical History:  Diagnosis Date   Alcohol abuse    Anemia    Anxiety    Arthritis    Cancer of hepatic flexure (HCC)    COPD (chronic obstructive pulmonary disease) (HCC)    Depression    DVT (deep venous thrombosis) (HCC) 12/24/2013   GERD (gastroesophageal reflux disease)    History of kidney stones    Hypertension    Insomnia    Other pulmonary embolism without acute cor pulmonale (HCC) 03/05/2011   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 Marijuna not taking since 7 month.    Sleep apnea    Weight loss    due to anxiety    Assessment:   BSW outreached patient to assess for SDOH barriers. During the call, BSW determined that the patient's main barriers include transportation, housing, food insecurity, and financial strain. Patient shared that she lives alone in a duplex with her dogs. She mentioned that her lights were recently disconnected but she received assistance to pay part of the bill and expects the power to be turned back on soon.Patient reported that she receives food stamps, but the amount is not always enough to cover all of her monthly food needs. BSW offered food pantry resources, and patient was agreeable to receiving a list. Patient also stated that she is a little behind on rent but plans to get caught up soon. She shared that her leasing office recently informed her that she must pay a fee for her pets, and she stated  that she will come up with the money to cover that cost. During the conversation, patient also expressed that she would like to change her primary care provider because her current clinic only allows walk-ins and she prefers to have appointments. This would also allow her to schedule transportation through her Medicaid plan in advance. BSW will send the patient a list of primary care providers that accept Medicaid, dentists who offer denture services and accept Medicaid, eye doctors who accept Medicaid, local food pantries, and financial assistance programs in [place].  [facility] at [facility] / [facility] [place] [facility] [employer] Food Pantry - [place] Chief of Staff Food Pantry [employer] Food Pantry [facility] Department of Social Services (DSS) Health and safety inspector (Emergency Assistance) Holiday representative - Family Services Office [place] 2-1-1 Helpline  Recommendation:   No recommendations at this time.  Follow Up Plan:   Patient has met all care management goals. Care Management case will be closed. Patient has been provided contact information should new needs arise.   Orlean Fey, BSW [place]  Value Based Care Institute Social Worker, [employer] [facility] [phone]

## 2024-05-29 ENCOUNTER — Other Ambulatory Visit: Payer: Self-pay | Admitting: *Deleted

## 2024-05-29 ENCOUNTER — Other Ambulatory Visit: Payer: Self-pay

## 2024-05-29 DIAGNOSIS — J441 Chronic obstructive pulmonary disease with (acute) exacerbation: Secondary | ICD-10-CM

## 2024-05-29 NOTE — Patient Instructions (Addendum)
 Visit Information  Ms. Emily Cameron was given information about Medicaid Managed Care team care coordination services as a part of their [facility] Community Plan Medicaid benefit.   If you would like to schedule transportation through your [facility], please call the following number at least 2 days in advance of your appointment: [phone]   Rides for urgent appointments can also be made after hours by calling Member Services.  Call the Behavioral Health Crisis Line at [phone], at any time, 24 hours a day, 7 days a week. If you are in danger or need immediate medical attention call 911.  Please see education materials related to COPD provided by MyChart link.  Patient verbalizes understanding of instructions and care plan provided today and agrees to view in MyChart. Active MyChart status and patient understanding of how to access instructions and care plan via MyChart confirmed with patient.     Telephone follow up appointment with Managed Medicaid care management team member scheduled for: 06/29/24 @ 11 am  LCSW visit scheduled for 06/25/24 @ 11 am  Hansel Devan, RN, BSN, [employer] RN Care Manager VBCI Population Health [phone]    Visit Information  Ms. Emily Cameron was given information about Medicaid Managed Care team care coordination services as a part of their [facility] Community Plan Medicaid benefit.   If you would like to schedule transportation through your [facility], please call the following number at least 2 days in advance of your appointment: [phone]   Rides for urgent appointments can also be made after hours by calling Member Services.  Call the Behavioral Health Crisis Line at [phone], at any time, 24 hours a day, 7 days a week. If you are in danger or need immediate medical attention call 911.  Please see education materials related to COPD provided by MyChart link.  Patient verbalizes  understanding of instructions and care plan provided today and agrees to view in MyChart. Active MyChart status and patient understanding of how to access instructions and care plan via MyChart confirmed with patient.     Telephone follow up appointment with Managed Medicaid care management team member scheduled for: 05/29/24 @ 11 am  Gagandeep Pettet, Charity fundraiser, Scientist, research (physical sciences), Theatre manager [employer] [phone]

## 2024-05-29 NOTE — Patient Outreach (Signed)
 Complex Care Management   Visit Note  05/29/2024  Name:  Emily Cameron MRN: [id number] DOB: Jan 13, 1961  Situation: Referral received for Complex Care Management related to COPD and SDOH Barriers:  Transportation Housing Insecurity Food insecurity Depression I obtained verbal consent from Patient.  Visit completed with Patient  on the phone  Background:   Past Medical History:  Diagnosis Date   Alcohol abuse    Anemia    Anxiety    Arthritis    Cancer of hepatic flexure (HCC)    COPD (chronic obstructive pulmonary disease) (HCC)    Depression    DVT (deep venous thrombosis) (HCC) 12/24/2013   GERD (gastroesophageal reflux disease)    History of kidney stones    Hypertension    Insomnia    Other pulmonary embolism without acute cor pulmonale (HCC) 03/05/2011   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 Marijuna not taking since 7 month.    Sleep apnea    Weight loss    due to anxiety    Assessment:  Successful outreach completed today.  Patient reports that she has received assistance from DSS to help with electric bill.  Patient reports that he is currently staying back in her apartment now.  Will refer to LCSW to assist with BHR resources for anxiety and depression.   Patient Reported Symptoms:  Cognitive Cognitive Status: Able to follow simple commands, Alert and oriented to person, place, and time, Insightful and able to interpret abstract concepts, Normal speech and language skills Cognitive/Intellectual Conditions Management [RPT]: Down Syndrome   Health Maintenance Behaviors: None, Annual physical exam, Healthy diet, Sleep adequate, Stress management, Social activities Healing Pattern: Average Health Facilitated by: Pain control, Healthy diet, Rest, Stress management  Neurological  Neurological Review of Symptoms: Headaches Neurological Management Strategies: Adequate rest, Medication therapy, Routine screening Neurological Self-Management Outcome: 3 (uncertain) Neurological Comment: Reports that she has occassional headaches.  She reports that she takes extra strength Tylenol  to help with headaches.  HEENT HEENT Symptoms Reported: No symptoms reported HEENT Management Strategies: Adequate rest, Coping strategies, Routine screening HEENT Self-Management Outcome: 4 (good)    Cardiovascular Cardiovascular Symptoms Reported: No symptoms reported Does patient have uncontrolled Hypertension?: No Cardiovascular Management Strategies: Coping strategies, Adequate rest Cardiovascular Self-Management Outcome: 4 (good)  Respiratory Respiratory Symptoms Reported: Productive cough Other Respiratory Symptoms: Patient reports she is short of breath when she ambulates long distances. Respiratory Management Strategies: Adequate rest, Routine screening Respiratory Self-Management Outcome: 3 (uncertain)  Endocrine Endocrine Symptoms Reported: No symptoms reported Is patient diabetic?: No Endocrine Self-Management Outcome: 4 (good)  Gastrointestinal Gastrointestinal Symptoms Reported: Constipation Additional Gastrointestinal Details: Reports that she takes Polyethylene Glycol to assist with constipation Gastrointestinal Management Strategies: Adequate rest Gastrointestinal Self-Management Outcome: 4 (good)    Genitourinary Genitourinary Symptoms Reported: No symptoms reported Genitourinary Management Strategies: Adequate rest Genitourinary Self-Management Outcome: 4 (good)  Integumentary Integumentary Symptoms Reported: No symptoms reported Skin Management Strategies: Adequate rest, Routine screening Skin Self-Management Outcome: 4 (good)  Musculoskeletal Musculoskelatal Symptoms Reviewed: Muscle pain Additional Musculoskeletal Details: Reports pain in lower extremities bilaterally.   Reports pain leve is a 7/10 on pain scale.  Patient reports that she sees pain doctor for chronic pain. Musculoskeletal Management Strategies: Adequate rest, Coping strategies, Routine screening, Medication therapy Falls in the past year?:  No Number of falls in past year: 1 or less Was there an injury with Fall?: No Fall Risk Category Calculator: 0 Patient Fall Risk Level: Low Fall Risk Patient at Risk for Falls Due to: No Fall Risks  Psychosocial Psychosocial Symptoms Reported: Anxiety - if selected complete GAD, Depression - if selected complete PHQ 2-9, Sadness - if selected complete PHQ 2-9 Additional Psychological Details: Would like to receive resources.  Will make LCSW referral Behavioral Management Strategies: Adequate rest, Support system Behavioral Health Self-Management Outcome: 3 (uncertain) Behavioral Health Comment: Reports that she has received couseling before Major Change/Loss/Stressor/Fears (CP): Medical condition, self Techniques to Cope with Loss/Stress/Change: Diversional activities, Medication (Reports that she needs medications to be refilled.) Quality of Family Relationships: helpful, involved, supportive Do you feel physically threatened by others?: No    05/29/2024    PHQ2-9 Depression Screening   Little interest or pleasure in doing things Several days  Feeling down, depressed, or hopeless Several days  PHQ-2 - Total Score 2  Trouble falling or staying asleep, or sleeping too much More than half the days  Feeling tired or having little energy Not at all  Poor appetite or overeating  Several days  Feeling bad about yourself - or that you are a failure or have let yourself or your family down Several days  Trouble concentrating on things, such as reading the newspaper or watching television Several days  Moving or speaking so slowly that other people could have noticed.  Or the opposite - being so fidgety or restless that you have been moving around a lot more  than usual Not at all  Thoughts that you would be better off dead, or hurting yourself in some way    PHQ2-9 Total Score 7  If you checked off any problems, how difficult have these problems made it for you to do your work, take care of things at home, or get along with other people Somewhat difficult  Depression Interventions/Treatment Medication (LCSW referral)    There were no vitals filed for this visit.  Medications Reviewed Today     Reviewed by Jorja Nichole LABOR, RN (Case Manager) on 05/29/24 at [phone]  Med List Status: <None>   Medication Order Taking? Sig Documenting Provider Last Dose Status Informant  albuterol  (VENTOLIN  HFA) 108 (90 Base) MCG/ACT inhaler [id number] Yes Inhale into the lungs every 4 (four) hours as needed for wheezing or shortness of breath. [provider]  Active   amitriptyline  (ELAVIL ) 50 MG tablet [id number]  Take 50 mg by mouth at bedtime.  Patient not taking: Reported on 05/29/2024   [provider]  Active   apixaban  (ELIQUIS ) 5 MG TABS tablet [id number] Yes Take 1 tablet (5 mg total) by mouth 2 (two) times daily. Start after completion of apixaban  starter pack Boscia, Heather E, NP  Active   budesonide-formoterol (SYMBICORT) 160-4.5 MCG/ACT inhaler [id number] Yes Inhale 2 puffs into the lungs 2 (two) times daily. [provider]  Active   ferrous sulfate  (FEROSUL) 325 (65 FE) MG tablet [id number] Yes Take 325 mg by mouth daily with breakfast. [provider]  Active Self  gabapentin  (NEURONTIN ) 600 MG tablet [id number]  Take 1 tablet (600 mg total) by mouth 3 (three) times daily as needed (pain).  Patient not taking: Reported on 05/29/2024   Boscia, Heather E, NP  Active Self  mirtazapine  (REMERON ) 30 MG tablet [id number]  Take 1 tablet (30 mg total) by mouth at bedtime.  Patient not taking: Reported on 05/29/2024  Boscia, Heather E, NP  Active Self  omeprazole  (PRILOSEC) 40 MG capsule [id number] Yes Take 1 capsule (40 mg total)  by mouth 2 (two) times daily as needed (acid reflux). Boscia, Heather E, NP  Active Self  oxyCODONE -acetaminophen  (PERCOCET) 10-325 MG tablet [id number] Yes Take 0.5-1 tablets by mouth every 6 (six) hours as needed for pain. Sheldon Standing, MD  Active Self  potassium chloride  (KLOR-CON  M) 10 MEQ tablet [id number]  Take 1 tablet (10 mEq total) by mouth daily.  Patient not taking: Reported on 05/29/2024   Boscia, Heather E, NP  Active   QUEtiapine  (SEROQUEL ) 200 MG tablet [id number]  Take 1 tablet (200 mg total) by mouth at bedtime.  Patient not taking: Reported on 05/29/2024   Boscia, Heather E, NP  Active Self  Tetrahydrozoline HCl [facility] OP) [id number]  Place 2 drops into both eyes as needed (for dry eyes). Reported on 09/09/2015  Patient not taking: Reported on 05/29/2024   [provider]  Active Self  tiZANidine  (ZANAFLEX ) 4 MG capsule [id number]  Take 4 mg by mouth 3 (three) times daily as needed for muscle spasms.  Patient not taking: Reported on 05/29/2024   [provider]  Active Self            Recommendation:   PCP Follow-up Specialty provider follow-up : Oncology- 09/13/23: Pain specialist 05/29/24 Continue Current Plan of Care  Follow Up Plan:   Telephone follow-up in 1 month: 06/29/24 @ 11 am  Rudell Ortman, RN, Scientist, research (physical sciences), Theatre manager [employer] [phone]

## 2024-05-30 ENCOUNTER — Telehealth: Admitting: *Deleted

## 2024-05-30 DIAGNOSIS — Z79899 Other long term (current) drug therapy: Secondary | ICD-10-CM | POA: Diagnosis not present

## 2024-06-01 DIAGNOSIS — Z79899 Other long term (current) drug therapy: Secondary | ICD-10-CM | POA: Diagnosis not present

## 2024-06-05 ENCOUNTER — Telehealth: Payer: Self-pay

## 2024-06-05 ENCOUNTER — Other Ambulatory Visit: Payer: Self-pay

## 2024-06-05 NOTE — Telephone Encounter (Signed)
 Pt called requesting if Dr. Lanny and her Team would write her a letter for an emotional support animal.  Pt stated she has 2 puppies which she's had for 1 year now and her landlord is now trying to charge her for each animal.  Pt stated she cannot afford to pay the additional money each month for her 2 dogs and was told if she could get a letter from her doctor stating the 2 dogs are for emotional support the additional fees would be waived.  Stated this nurse will make Dr. Lanny and her Team aware of the pt's request.

## 2024-06-05 NOTE — Telephone Encounter (Signed)
 Spoke with pt via telephone to inform pt of Devere Manna, [place] & Dr. Demetra response.  Instructed pt to reach out to [employer] or Darice Simpler, LCSW for the letter for the 2 support dogs.  Pt verbalized understanding and had no further questions.  Also, sent a message to both [place] and Darice regarding the pt's request.

## 2024-06-25 ENCOUNTER — Other Ambulatory Visit: Payer: Self-pay | Admitting: Licensed Clinical Social Worker

## 2024-06-25 NOTE — Patient Outreach (Signed)
 Complex Care Management   Visit Note  06/25/2024  Name:  Emily Cameron MRN: [id number] DOB: Nov 07, 1960  Situation: Referral received for Complex Care Management related to SDOH needs; Depression; anxiety  I obtained verbal consent from Patient.  Visit completed with Patient  on the phone  Background:   Past Medical History:  Diagnosis Date   Alcohol abuse    Anemia    Anxiety    Arthritis    Cancer of hepatic flexure (HCC)    COPD (chronic obstructive pulmonary disease) (HCC)    Depression    DVT (deep venous thrombosis) (HCC) 12/24/2013   GERD (gastroesophageal reflux disease)    History of kidney stones    Hypertension    Insomnia    Other pulmonary embolism without acute cor pulmonale (HCC) 03/05/2011   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 Marijuna not taking since 7 month.    Sleep apnea    Weight loss    due to anxiety    Assessment: Patient Reported Symptoms:  Cognitive Cognitive Status: Able to follow simple commands, Alert and oriented to person, place, and time   Health Maintenance Behaviors: Annual physical exam Health Facilitated by: Stress management  Neurological Neurological Review of Symptoms: Headaches (BP level fluctuates) Neurological Management Strategies: Adequate rest, Coping strategies  HEENT HEENT Symptoms Reported: Sudden change or loss of vision HEENT Management Strategies: Coping strategies    Cardiovascular Cardiovascular Symptoms Reported: Fatigue (headaches occasionally) Cardiovascular Management Strategies: Coping strategies  Respiratory Respiratory Symptoms Reported: Shortness of breath Other Respiratory Symptoms: SOB occasionally Respiratory Management Strategies: Adequate rest  Endocrine Endocrine Symptoms Reported: Weakness or fatigue,  Shortness of breath, Headaches, Increased thirst Is patient diabetic?: No    Gastrointestinal Gastrointestinal Symptoms Reported: Constipation Additional Gastrointestinal Details: Client said she had 75 percent of her colon removed (nearly 2 years ago) Gastrointestinal Management Strategies: Adequate rest    Genitourinary Genitourinary Symptoms Reported: Frequency Additional Genitourinary Details: frequency Genitourinary Management Strategies: Adequate rest, Coping strategies  Integumentary Integumentary Symptoms Reported: Skin changes Additional Integumentary Details: dry skin Skin Management Strategies: Adequate rest, Coping strategies  Musculoskeletal Musculoskelatal Symptoms Reviewed: Muscle pain, Unsteady gait, Weakness Musculoskeletal Management Strategies: Adequate rest, Coping strategies      Psychosocial Psychosocial Symptoms Reported: Anxiety - if selected complete GAD, Sadness - if selected complete PHQ 2-9 Additional Psychological Details: wants to receive resources for community help. Behavioral Management Strategies: Coping strategies Major Change/Loss/Stressor/Fears (CP): Medical condition, self Techniques to Cope with Loss/Stress/Change: Diversional activities, Medication Quality of Family Relationships: unable to assess Do you feel physically threatened by others?: No Financial strain; food scarcity ; transport challenges    06/25/2024    PHQ2-9 Depression Screening   Little interest or pleasure in doing things Several days  Feeling down, depressed, or hopeless Several days  PHQ-2 - Total Score 2  Trouble falling or staying asleep, or sleeping too much More than half the days  Feeling tired or having little energy More than half the days  Poor appetite or overeating  Several days  Feeling bad about yourself - or that you are a failure or have let yourself or your family down Several days  Trouble concentrating on things, such as reading the newspaper or watching  television Several days  Moving or speaking so slowly  that other people could have noticed.  Or the opposite - being so fidgety or restless that you have been moving around a lot more than usual Several days  Thoughts that you would be better off dead, or hurting yourself in some way Not at all  PHQ2-9 Total Score 10  If you checked off any problems, how difficult have these problems made it for you to do your work, take care of things at home, or get along with other people    Depression Interventions/Treatment Medication, Counseling    Vitals:  Client reported that her BP fluctuates periodically .   Medications Reviewed Today   Medications were not reviewed in this encounter     Recommendation:   PCP follow up as scheduled Attend scheduled appointments at [facility]  Take medications as prescribed Call [facility] in [place], [place] to seek to re-establish medical care for client at that medical clinic Call LCSW as needed for SW support   Follow Up Plan:   Telephone follow up appointment date/time:  08/06/24 at 9:30 AM   Glendia Pear  MSW, LCSW [place]/Value Based Care [facility] Licensed Clinical Social Worker Direct Dial:  [phone] Fax:  [phone] Website:  [email or web address]

## 2024-06-25 NOTE — Patient Instructions (Signed)
 Visit Information  Thank you for taking time to visit with me today. Please don't hesitate to contact me if I can be of assistance to you before our next scheduled appointment.  Our next appointment is by telephone on 08/06/24 at 9:30 AM   Please call the care guide team at [phone] if you need to cancel or reschedule your appointment.   Following is a copy of your care plan:   Goals Addressed             This Visit's Progress    VBCI Social Work Care Plan       Problems:   Transport needs              Food scarcity              Pain issues               Utility payment issues  CSW Clinical Goal(s):   Over the next 30 days  the Patient will attend all scheduled medical appointments as evidenced by patient report and care team review of appointment completion in electronic medical record.              Over next 30 days , client will contact community agencies of choice to inquire about community agency support possibly for client AEB patient report of contacting community agencies (DSS, Stage manager, Administrator, arts of choice. Utility assistance programs)  Interventions:  Spoke via phone with client about client needs             Discussed medication procurement            Discussed client support with RN Nichole Jobs            Discussed pain issues of client. Client said she goes to [facility] in [place], [place] for help with pain management issues             Client did receive list of community resources from [place].  Client plans to contact [facility] to seek to re-establish care at that practice.  Client said she had gone to that practice as a patient previously                Client has some support from her ex-husband. He will sometimes help her with transport needs to and from her appointments.               Client spoke of history of cancer treatments.                Discussed program support with RN, LCSW, Pharmacist              Completed assessments as  needed. Completed GAD-7. Completed PHQ 2/9              Provided counseling support for client.              Client has community resources list with food pantries, utility support agency, housing resources, administrator, arts names.  Encouraged client to call community agencies of choice to seek help for addressing client needs               Discussed mood of client. She said she is sad sometimes and anxious sometimes. She is hoping to re-establish care at [facility] in [place]               Encouraged client to call LCSW as needed for support at [phone]  Client was appreciative of call today from LCSW  Patient Goals/Self-Care Activities:  Attend scheduled appointments at [facility] Management Clinic            Take medications as prescribed             Call [facility] to seek to re-establish medical care for client at that medical clinic             Call LCSW as needed for SW support   Plan:   LCSW to call client on 08/06/24 at 9:30 AM        Please go to [facility] [address], [place] [phone]) if you are experiencing a Mental Health or Behavioral Health Crisis or need someone to talk to.  Patient verbalized understanding of Care plan and visit instructions communicated this visit   Emily Cameron  MSW, LCSW [place]/Value Based Care [facility] Licensed Clinical Social Worker Direct Dial:  [phone] Fax:  [phone] Website:  [email or web address]

## 2024-06-27 DIAGNOSIS — Z79899 Other long term (current) drug therapy: Secondary | ICD-10-CM | POA: Diagnosis not present

## 2024-06-29 DIAGNOSIS — Z79899 Other long term (current) drug therapy: Secondary | ICD-10-CM | POA: Diagnosis not present

## 2024-07-03 ENCOUNTER — Other Ambulatory Visit: Payer: Self-pay | Admitting: *Deleted

## 2024-07-03 ENCOUNTER — Other Ambulatory Visit: Payer: Self-pay

## 2024-07-03 NOTE — Patient Instructions (Signed)
 Visit Information  Ms. Sarrazin was given information about Medicaid Managed Care team care coordination services as a part of their [facility] Community Plan Medicaid benefit.   If you would like to schedule transportation through your [facility], please call the following number at least 2 days in advance of your appointment: [phone]   Rides for urgent appointments can also be made after hours by calling Member Services.  Call the Behavioral Health Crisis Line at [phone], at any time, 24 hours a day, 7 days a week. If you are in danger or need immediate medical attention call 911.  Please see education materials related to COPD provided by MyChart link.  Care plan and visit instructions communicated with the patient verbally today. Patient agrees to receive a copy in MyChart. Active MyChart status and patient understanding of how to access instructions and care plan via MyChart confirmed with patient.     Telephone follow up appointment with Managed Medicaid care management team member scheduled for: 08/08/24 @ 11 am  Deloris Mittag, RN, BSN, ACM RN Care Manager [facility] [phone]   Following is a copy of your plan of care:  There are no care plans that you recently modified to display for this patient.  Visit Information  Ms. Hochberg was given information about Medicaid Managed Care team care coordination services as a part of their [facility] Community Plan Medicaid benefit.   If you would like to schedule transportation through your [facility], please call the following number at least 2 days in advance of your appointment: [phone]   Rides for urgent appointments can also be made after hours by calling Member Services.  Call the Behavioral Health Crisis Line at [phone], at any time, 24 hours a day, 7 days a week. If you are in danger or need immediate medical attention call 911.  Please  see education materials related to COPD provided by MyChart link.  Care plan and visit instructions communicated with the patient verbally today. Patient agrees to receive a copy in MyChart. Active MyChart status and patient understanding of how to access instructions and care plan via MyChart confirmed with patient.     Telephone follow up appointment with Managed Medicaid care management team member scheduled for: 08/08/24 @ 11 am  Draden Cottingham, CHARITY FUNDRAISER, SCIENTIST, RESEARCH (PHYSICAL SCIENCES), Theatre Manager [employer] [phone]

## 2024-07-03 NOTE — Patient Outreach (Signed)
 Complex Care Management   Visit Note  07/03/2024  Name:  Emily Cameron MRN: [id number] DOB: [birth year]  Situation: Referral received for Complex Care Management related to COPD and SDOH Barriers:  Transportation Housing Insecurity Food insecurity Lack of essential utilities   I obtained verbal consent from Patient.  Visit completed with Patient  on the phone  Background:   Past Medical History:  Diagnosis Date   Alcohol abuse    Anemia    Anxiety    Arthritis    Cancer of hepatic flexure (HCC)    COPD (chronic obstructive pulmonary disease) (HCC)    Depression    DVT (deep venous thrombosis) (HCC) 12/24/2013   GERD (gastroesophageal reflux disease)    History of kidney stones    Hypertension    Insomnia    Other pulmonary embolism without acute cor pulmonale (HCC) 03/05/2011   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 Marijuna not taking since 7 month.    Sleep apnea    Weight loss    due to anxiety    Assessment: Patient Reported Symptoms:  Cognitive Cognitive Status: Able to follow simple commands, Alert and oriented to person, place, and time, Insightful and able to interpret abstract concepts, Normal speech and language skills Cognitive/Intellectual Conditions Management [RPT]: None reported or documented in medical history or problem list   Health Maintenance Behaviors: Annual physical exam, Healthy diet, Sleep adequate, Stress management Healing Pattern: Average Health Facilitated by: Stress management, Rest, Pain control, Healthy diet  Neurological Neurological Review of Symptoms: No symptoms reported Neurological Management Strategies: Adequate rest, Routine screening Neurological Self-Management Outcome: 4 (good)  HEENT HEENT Symptoms Reported: No symptoms reported HEENT  Management Strategies: Adequate rest, Routine screening HEENT Self-Management Outcome: 4 (good)    Cardiovascular Cardiovascular Symptoms Reported: Fatigue Does patient have uncontrolled Hypertension?: No Cardiovascular Management Strategies: Coping strategies, Adequate rest, Routine screening Cardiovascular Self-Management Outcome: 4 (good) Cardiovascular Comment: Patient reports occassional fatigue  Respiratory Respiratory Symptoms Reported: Shortness of breath, Wheezing Other Respiratory Symptoms: Reports occasional shortness of breath Respiratory Management Strategies: Adequate rest, Routine screening Respiratory Self-Management Outcome: 4 (good)  Endocrine Endocrine Symptoms Reported: Weakness or fatigue Is patient diabetic?: No    Gastrointestinal Gastrointestinal Symptoms Reported: No symptoms reported Gastrointestinal Management Strategies: Adequate rest Gastrointestinal Self-Management Outcome: 4 (good)    Genitourinary Genitourinary Symptoms Reported: Frequency Genitourinary Management Strategies: Adequate rest, Coping strategies Genitourinary Self-Management Outcome: 3 (uncertain)  Integumentary Integumentary Symptoms Reported: Other Other Integumentary Symptoms: Patient reports dry skin.  Reports that she uses OTC products to help keep skin moisturized Skin Management Strategies: Adequate rest, Coping strategies Skin Self-Management Outcome: 3 (uncertain)  Musculoskeletal Musculoskelatal Symptoms Reviewed: Muscle pain, Weakness, Unsteady gait Additional Musculoskeletal Details: Reports pain in lower extremities bilaterally  Reports that pain level is 2/10 on pain scale.  Patient reports that she sees the pain doctor for chronic pain.   Falls in the past year?: No Number of falls in past year: 1 or less Patient at Risk for Falls Due to: No Fall Risks  Psychosocial Psychosocial Symptoms Reported: Anxiety - if selected complete GAD, Sadness - if selected complete PHQ  2-9 Additional Psychological Details: LSCW following Behavioral Management Strategies: Coping strategies Behavioral Health Self-Management Outcome: 3 (uncertain) Major Change/Loss/Stressor/Fears (CP): Medical condition, self Techniques to [employer]  with Loss/Stress/Change: Medication, Diversional activities Quality of Family Relationships: helpful, involved, supportive Do you feel physically threatened by others?: No    07/03/2024    PHQ2-9 Depression Screening   Little interest or pleasure in doing things Several days  Feeling down, depressed, or hopeless Several days  PHQ-2 - Total Score 2  Trouble falling or staying asleep, or sleeping too much More than half the days  Feeling tired or having little energy More than half the days  Poor appetite or overeating  Several days  Feeling bad about yourself - or that you are a failure or have let yourself or your family down Several days  Trouble concentrating on things, such as reading the newspaper or watching television Several days  Moving or speaking so slowly that other people could have noticed.  Or the opposite - being so fidgety or restless that you have been moving around a lot more than usual Several days  Thoughts that you would be better off dead, or hurting yourself in some way Not at all  PHQ2-9 Total Score 10  If you checked off any problems, how difficult have these problems made it for you to do your work, take care of things at home, or get along with other people Somewhat difficult  Depression Interventions/Treatment Counseling, Medication    There were no vitals filed for this visit. Pain Scale: 0-10 Pain Score: 2  Pain Type: Chronic pain Pain Location: Leg Pain Orientation: Right, Left Pain Descriptors / Indicators: Aching, Discomfort Patients Stated Pain Goal: 2 Pain Intervention(s): Medication (See eMAR), Relaxation, Rest  Medications Reviewed Today     Reviewed by Jorja Nichole LABOR, RN (Case Manager) on 07/03/24  at 1148  Med List Status: <None>   Medication Order Taking? Sig Documenting Provider Last Dose Status Informant  albuterol  (VENTOLIN  HFA) 108 (90 Base) MCG/ACT inhaler [id number] Yes Inhale into the lungs every 4 (four) hours as needed for wheezing or shortness of breath. [provider]  Active   amitriptyline  (ELAVIL ) 50 MG tablet [id number]  Take 50 mg by mouth at bedtime.  Patient not taking: Reported on 07/03/2024   [provider]  Active   apixaban  (ELIQUIS ) 5 MG TABS tablet [id number] Yes Take 1 tablet (5 mg total) by mouth 2 (two) times daily. Start after completion of apixaban  starter pack Boscia, Heather E, NP  Active   budesonide-formoterol (SYMBICORT) 160-4.5 MCG/ACT inhaler [id number]  Inhale 2 puffs into the lungs 2 (two) times daily.  Patient not taking: Reported on 07/03/2024   [provider]  Active   ferrous sulfate  (FEROSUL) 325 (65 FE) MG tablet [id number] Yes Take 325 mg by mouth daily with breakfast. [provider]  Active Self  gabapentin  (NEURONTIN ) 600 MG tablet [id number] Yes Take 1 tablet (600 mg total) by mouth 3 (three) times daily as needed (pain).  Patient taking differently: Take 1 tablet (600 mg total) by mouth 3 (three) times daily as needed (pain).   Boscia, Heather E, NP  Active Self  mirtazapine  (REMERON ) 30 MG tablet [id number]  Take 1 tablet (30 mg total) by mouth at bedtime.  Patient not taking: Reported on 07/03/2024   Boscia, Heather E, NP  Active Self  omeprazole  (PRILOSEC) 40 MG capsule [id number] Yes Take 1 capsule (40 mg total) by mouth 2 (two) times daily as needed (acid reflux). Boscia, Heather E, NP  Active Self  oxyCODONE -acetaminophen  (PERCOCET) 10-325 MG tablet [id number] Yes Take 0.5-1 tablets by mouth every 6 (six) hours as needed  for pain. Sheldon Standing, MD  Active Self  potassium chloride  (KLOR-CON  M) 10 MEQ tablet [id number]  Take 1 tablet (10 mEq total) by mouth daily.  Patient not taking: Reported on  07/03/2024   Boscia, Heather E, NP  Active   QUEtiapine  (SEROQUEL ) 200 MG tablet [id number]  Take 1 tablet (200 mg total) by mouth at bedtime.  Patient not taking: Reported on 07/03/2024   Boscia, Heather E, NP  Active Self  Tetrahydrozoline HCl (VISINE OP) [id number]  Place 2 drops into both eyes as needed (for dry eyes). Reported on 09/09/2015  Patient not taking: Reported on 07/03/2024   [provider]  Active Self  tiZANidine  (ZANAFLEX ) 4 MG capsule [id number]  Take 4 mg by mouth 3 (three) times daily as needed for muscle spasms.  Patient not taking: Reported on 07/03/2024   [provider]  Active Self            Recommendation:   PCP Follow-up Specialty provider follow-up : Oncology-09/12/24 Continue Current Plan of Care  Follow Up Plan:   Telephone follow-up in 1 month: 08/08/24 @11  am  Lisette Mancebo, RN, BSN, ACM RN Care Manager [employer] [phone]

## 2024-07-27 DIAGNOSIS — D539 Nutritional anemia, unspecified: Secondary | ICD-10-CM | POA: Diagnosis not present

## 2024-07-27 DIAGNOSIS — E559 Vitamin D deficiency, unspecified: Secondary | ICD-10-CM | POA: Diagnosis not present

## 2024-07-27 DIAGNOSIS — E7800 Pure hypercholesterolemia, unspecified: Secondary | ICD-10-CM | POA: Diagnosis not present

## 2024-07-27 DIAGNOSIS — R5383 Other fatigue: Secondary | ICD-10-CM | POA: Diagnosis not present

## 2024-08-01 DIAGNOSIS — I10 Essential (primary) hypertension: Secondary | ICD-10-CM | POA: Diagnosis not present

## 2024-08-06 ENCOUNTER — Other Ambulatory Visit: Payer: Self-pay | Admitting: Licensed Clinical Social Worker

## 2024-08-06 NOTE — Patient Outreach (Signed)
 Complex Care Management   Visit Note  08/06/2024  Name:  Emily Cameron MRN: [id number] DOB: 07/24/1961  Situation: Referral received for Complex Care Management related to stress/anxiety issues; SDOH needs I obtained verbal consent from Patient.  Visit completed with Patient  on the phone  Background:   Past Medical History:  Diagnosis Date   Alcohol abuse    Anemia    Anxiety    Arthritis    Cancer of hepatic flexure (HCC)    COPD (chronic obstructive pulmonary disease) (HCC)    Depression    DVT (deep venous thrombosis) (HCC) 12/24/2013   GERD (gastroesophageal reflux disease)    History of kidney stones    Hypertension    Insomnia    Other pulmonary embolism without acute cor pulmonale (HCC) 03/05/2011   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 Marijuna not taking since 7 month.    Sleep apnea    Weight loss    due to anxiety    Assessment: Patient Reported Symptoms:  Cognitive Cognitive Status: Able to follow simple commands, Alert and oriented to person, place, and time, Normal speech and language skills Cognitive/Intellectual Conditions Management [RPT]: None reported or documented in medical history or problem list   Health Maintenance Behaviors: Stress management Health Facilitated by: Stress management  Neurological Neurological Review of Symptoms: No symptoms reported Neurological Management Strategies: Coping strategies  HEENT HEENT Symptoms Reported: No symptoms reported HEENT Management Strategies: Coping strategies, Adequate rest    Cardiovascular   Cardiovascular Management Strategies: Coping strategies  Respiratory Respiratory Symptoms Reported: Shortness of breath, Wheezing Respiratory Management Strategies: Coping strategies  Endocrine Endocrine Symptoms  Reported: Weakness or fatigue Is patient diabetic?: No    Gastrointestinal Gastrointestinal Symptoms Reported: No symptoms reported Gastrointestinal Management Strategies: Coping strategies    Genitourinary Genitourinary Symptoms Reported: Frequency Additional Genitourinary Details: frequency Genitourinary Management Strategies: Coping strategies  Integumentary Integumentary Symptoms Reported: Skin changes Other Integumentary Symptoms: patient reports dry skin Skin Management Strategies: Adequate rest, Coping strategies  Musculoskeletal Musculoskelatal Symptoms Reviewed: Muscle pain, Weakness, Unsteady gait Musculoskeletal Management Strategies: Adequate rest, Coping strategies      Psychosocial Psychosocial Symptoms Reported: Sadness - if selected complete PHQ 2-9, Anxiety - if selected complete GAD, Depression - if selected complete PHQ 2-9 Behavioral Management Strategies: Coping strategies Major Change/Loss/Stressor/Fears (CP): Medical condition, self Techniques to Cope with Loss/Stress/Change: Diversional activities, Medication Quality of Family Relationships: unable to assess Do you feel physically threatened by others?: No    08/06/2024    PHQ2-9 Depression Screening   Little interest or pleasure in doing things Several days  Feeling down, depressed, or hopeless Several days  PHQ-2 - Total Score 2  Trouble falling or staying asleep, or sleeping too much More than half the days  Feeling tired or having little energy More than half the days  Poor appetite or overeating  Several days  Feeling bad about yourself - or that you are a failure or have let yourself or your family down Several days  Trouble concentrating on things, such as reading the newspaper or watching television Several days  Moving or speaking so slowly that other people could have noticed.  Or the opposite - being so fidgety or restless that you have been moving around a lot more than usual  Several days   Thoughts that you would be better off dead, or hurting yourself in some way Not at all  PHQ2-9 Total Score 10  If you checked off any problems, how difficult have these problems made it for you to do your work, take care of things at home, or get along with other people Somewhat difficult  Depression Interventions/Treatment Counseling, Medication    Today's Vitals  Client did not mention any BP problems during her call with LCSW today  Pain Scale: 0-10 Pain Score: 2  Pain Type: Chronic pain Pain Location: Leg Pain Descriptors / Indicators: Aching Pain Intervention(s): Relaxation  Medications Reviewed Today   Medications were not reviewed in this encounter     Recommendation:   PCP Follow-up Continue Current Plan of Care Call RN Nichole Jobs as needed for nursing support Call LCSW as needed for SW support Attend scheduled appointments at [facility]  Follow Up Plan:   Telephone follow up appointment date/time:  09/11/2024 at 10:00 AM   Glendia Pear  MSW, LCSW [place]/[facility] Licensed Clinical Social Worker Direct Dial:  [phone] Fax:  [phone] Website:  [email or web address]

## 2024-08-06 NOTE — Patient Instructions (Signed)
 Visit Information  Thank you for taking time to visit with me today. Please don't hesitate to contact me if I can be of assistance to you before our next scheduled appointment.  Our next appointment is by telephone on 09/11/2024 at 10:00 AM   Please call the care guide team at [phone] if you need to cancel or reschedule your appointment.   Following is a copy of your care plan:   Goals Addressed             This Visit's Progress    VBCI Social Work Care Plan       Problems:   Transport needs              Food scarcity              Pain issues               Utility payment issues  CSW Clinical Goal(s):   Over the next 30 days  the Patient will attend all scheduled medical appointments as evidenced by patient report and care team review of appointment completion in electronic medical record.              Over next 30 days , client will contact community agencies of choice to inquire about community agency support possibly for client AEB patient report of contacting community agencies (DSS, Stage manager, Administrator, arts of choice. Utility assistance programs)  Interventions:  Spoke via phone with client about client needs             Discussed medication procurement            Discussed previously  client support with RN Nichole Jobs            Discussed pain issues of client. Client said she goes to [facility] in [place], [place] for help with pain management issues. Client said she is in process of locating a new doctor for medical care. She is trying to get referral to a different Pain Management clinic to help her with Pain management issues.             Client did receive list of community resources from BSW previously. She has list of community resources she can call related to needs such as food needs, housing needs, financial needs.                 Client has some support from her ex-husband. He will sometimes help her with transport needs to and from her  appointments.               Discussed program support with RN, LCSW, Pharmacist              Completed assessments as needed. Completed GAD-7. Completed PHQ 2/9              Provided counseling support for client.              Used Active Listening skills to allow client to share her feelings and discuss her current needs              Encouraged client to call LCSW as needed for support at [phone]                 Client was appreciative of call today from LCSW  Patient Goals/Self-Care Activities:  Attend scheduled appointments at [facility]            Take medications as  prescribed             Call RN Faye McLaurin as needed for nursing support             Call LCSW as needed for SW support   Plan:   LCSW to call client on 09/11/2024 at 10:00 AM        Please go to [facility] [address] [place] [phone]) if you are experiencing a Mental Health or Behavioral Health Crisis or need someone to talk to.  Patient verbalized understanding of Care plan and visit instructions communicated this visit   Emily Cameron  MSW, LCSW [place]/[facility] Licensed Clinical Social Worker Direct Dial:  [phone] Fax:  [phone] Website:  [email or web address]

## 2024-08-08 ENCOUNTER — Other Ambulatory Visit: Payer: Self-pay | Admitting: *Deleted

## 2024-08-08 NOTE — Patient Instructions (Signed)
 Toy CHRISTELLA Louder - I am sorry I was unable to reach you today for our scheduled appointment. I work with Leron Millman, NP and am calling to support your healthcare needs.  I have rescheduled your appointment for 09/19/24 @ 1130 am.  Please contact me at [phone] at your earliest convenience. I look forward to speaking with you soon.   Thank you,  Glyndon Tursi, RN, BSN, ACM RN Care Manager [employer] [phone]

## 2024-09-11 ENCOUNTER — Other Ambulatory Visit: Payer: Self-pay | Admitting: Licensed Clinical Social Worker

## 2024-09-11 ENCOUNTER — Other Ambulatory Visit: Payer: Self-pay | Admitting: Nurse Practitioner

## 2024-09-11 DIAGNOSIS — C182 Malignant neoplasm of ascending colon: Secondary | ICD-10-CM

## 2024-09-11 NOTE — Patient Outreach (Signed)
 Complex Care Management   Visit Note  09/11/2024  Name:  Emily Cameron MRN: [id number] DOB: 10-Sep-1960  Situation: Referral received for Complex Care Management related to mental health needs; anxiety issues I obtained verbal consent from Patient.  Visit completed with Patient  on the phone  Background:   Past Medical History:  Diagnosis Date   Alcohol abuse    Anemia    Anxiety    Arthritis    Cancer of hepatic flexure (HCC)    COPD (chronic obstructive pulmonary disease) (HCC)    Depression    DVT (deep venous thrombosis) (HCC) 12/24/2013   GERD (gastroesophageal reflux disease)    History of kidney stones    Hypertension    Insomnia    Other pulmonary embolism without acute cor pulmonale (HCC) 03/05/2011   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 Marijuna not taking since 7 month.    Sleep apnea    Weight loss    due to anxiety    Assessment: Patient Reported Symptoms:  Cognitive Cognitive Status: Alert and oriented to person, place, and time Cognitive/Intellectual Conditions Management [RPT]: None reported or documented in medical history or problem list   Health Maintenance Behaviors: Stress management Health Facilitated by: Stress management  Neurological Neurological Review of Symptoms: No symptoms reported Neurological Management Strategies: Coping strategies  HEENT HEENT Symptoms Reported: No symptoms reported HEENT Management Strategies: Coping strategies    Cardiovascular Cardiovascular Symptoms Reported: Fatigue Does patient have uncontrolled Hypertension?: No Cardiovascular Management Strategies: Coping strategies  Respiratory Respiratory Symptoms Reported: Shortness of breath, Wheezing Respiratory Management Strategies: Coping strategies  Endocrine Endocrine  Symptoms Reported: Weakness or fatigue    Gastrointestinal Gastrointestinal Symptoms Reported: No symptoms reported Gastrointestinal Management Strategies: Coping strategies    Genitourinary Genitourinary Symptoms Reported: Frequency Genitourinary Management Strategies: Coping strategies  Integumentary Integumentary Symptoms Reported: Skin changes Other Integumentary Symptoms: dry skin occasionally Skin Management Strategies: Coping strategies, Adequate rest  Musculoskeletal Musculoskelatal Symptoms Reviewed: Muscle pain, Weakness, Unsteady gait Musculoskeletal Management Strategies: Adequate rest, Coping strategies      Psychosocial Psychosocial Symptoms Reported: Sadness - if selected complete PHQ 2-9, Depression - if selected complete PHQ 2-9, Anxiety - if selected complete GAD Additional Psychological Details: stress related to managing ongoing health needs Behavioral Management Strategies: Coping strategies Major Change/Loss/Stressor/Fears (CP): Medical condition, self Techniques to Cope with Loss/Stress/Change: Diversional activities, Counseling Quality of Family Relationships: unable to assess Do you feel physically threatened by others?: No    09/11/2024    PHQ2-9 Depression Screening   Little interest or pleasure in doing things Several days  Feeling down, depressed, or hopeless Several days  PHQ-2 - Total Score 2  Trouble falling or staying asleep, or sleeping too much More than half the days  Feeling tired or having little energy More than half the days  Poor appetite or overeating  Several days  Feeling bad about yourself - or that you are a failure or have let yourself or your family down Several days  Trouble concentrating on things, such as reading the newspaper or watching television Several days  Moving or speaking so slowly that other people could have noticed.  Or the opposite - being so fidgety or restless that you have been moving around a lot more than usual  Several  days  Thoughts that you would be better off dead, or hurting yourself in some way Not at all  PHQ2-9 Total Score 10  If you checked off any problems, how difficult have these problems made it for you to do your work, take care of things at home, or get along with other people Somewhat difficult  Depression Interventions/Treatment Counseling    Today's Vitals  Client did not mention any BP problems during LCSW call with client today  Pain Scale: 0-10 Pain Score: 2  Pain Type: Chronic pain Pain Location: Leg Pain Descriptors / Indicators: Aching Patients Stated Pain Goal: 1 Pain Intervention(s): Relaxation  Medications Reviewed Today     Reviewed by Frances Ozell RAMAN, LCSW (Social Worker) on 09/11/24 at 1026  Med List Status: <None>   Medication Order Taking? Sig Documenting Provider Last Dose Status Informant  albuterol  (VENTOLIN  HFA) 108 (90 Base) MCG/ACT inhaler [id number] Yes Inhale into the lungs every 4 (four) hours as needed for wheezing or shortness of breath. [provider]  Active   amitriptyline  (ELAVIL ) 50 MG tablet [id number] Yes Take 50 mg by mouth at bedtime. [provider]  Active   apixaban  (ELIQUIS ) 5 MG TABS tablet [id number] Yes Take 1 tablet (5 mg total) by mouth 2 (two) times daily. Start after completion of apixaban  starter pack Boscia, Heather E, NP  Active   budesonide-formoterol (SYMBICORT) 160-4.5 MCG/ACT inhaler [id number] Yes Inhale 2 puffs into the lungs 2 (two) times daily. [provider]  Active   ferrous sulfate  (FEROSUL) 325 (65 FE) MG tablet [id number] Yes Take 325 mg by mouth daily with breakfast. [provider]  Active Self  gabapentin  (NEURONTIN ) 600 MG tablet [id number] Yes Take 1 tablet (600 mg total) by mouth 3 (three) times daily as needed (pain). Boscia, Heather E, NP  Active Self  mirtazapine  (REMERON ) 30 MG tablet [id number] Yes Take 1 tablet (30 mg total) by mouth at bedtime. Boscia, Heather E, NP   Active Self  omeprazole  (PRILOSEC) 40 MG capsule [id number] Not taking  Take 1 capsule (40 mg total) by mouth 2 (two) times daily as needed (acid reflux).  Patient not taking: Reported on 09/11/2024   Boscia, Heather E, NP  Active Self  oxyCODONE -acetaminophen  (PERCOCET) 10-325 MG tablet [id number] Yes Take 0.5-1 tablets by mouth every 6 (six) hours as needed for pain. Sheldon Standing, MD  Active Self  potassium chloride  (KLOR-CON  M) 10 MEQ tablet [id number] Not taking  Take 1 tablet (10 mEq total) by mouth daily.  Patient not taking: Reported on 09/11/2024   Boscia, Heather E, NP  Active   QUEtiapine  (SEROQUEL ) 200 MG tablet [id number] Not taking  Take 1 tablet (200 mg total) by mouth at bedtime.  Patient not taking: Reported on 09/11/2024   Boscia, Heather E, NP  Active Self  Tetrahydrozoline HCl (VISINE OP) [id number] Not taking  Place 2 drops into both eyes as needed (for dry eyes). Reported on 09/09/2015  Patient not taking: Reported on 07/03/2024   [provider]  Active Self  tiZANidine  (ZANAFLEX ) 4 MG capsule [id number] Not taking  Take 4 mg by mouth 3 (three) times daily as needed for muscle spasms.  Patient not taking: Reported on 07/03/2024   [provider]  Active Self            Recommendation:   PCP Follow-up Continue Current Plan of Care Call LCSW as needed for SW support Attend scheduled appointment tomorrow at 1:00 at Medical Oncology Allow time for rest  and relaxation  Follow Up Plan:   Telephone follow up appointment date/time:  10/22/2024 at 10:15 AM   Glendia Pear  MSW, LCSW [place]/Value Based Care [facility] Licensed Clinical Social Worker Direct Dial:  [phone] Fax:  [phone] Website:  [email or web address]

## 2024-09-11 NOTE — Patient Instructions (Signed)
 Visit Information  Thank you for taking time to visit with me today. Please don't hesitate to contact me if I can be of assistance to you before our next scheduled appointment.  Our next appointment is by telephone on 10/22/2024 at 10:15 AM   Please call the care guide team at [phone] if you need to cancel or reschedule your appointment.   Following is a copy of your care plan:   Goals Addressed             This Visit's Progress    VBCI Social Work Care Plan   On track    Problems:   Transport needs              Food scarcity              Pain issues               Utility payment issues  CSW Clinical Goal(s):   Over the next 30 days  the Patient will attend all scheduled medical appointments as evidenced by patient report and care team review of appointment completion in electronic medical record.              Over next 30 days , client will contact community agencies of choice to inquire about community agency support possibly for client AEB patient report of contacting community agencies (DSS, Stage manager, Administrator, arts of choice. Utility assistance programs)  Interventions:  Spoke via phone with client about client needs             Discussed medication procurement            Discussed client support with RN Nichole Jobs            Client did receive list of community resources from BSW previously. She has list of community resources she can call related to needs such as food needs, housing needs, financial needs.                 Client has some support from her ex-husband. He will sometimes help her with transport needs to and from her appointments.               Discussed program support with RN, LCSW, Pharmacist              Completed assessments as needed. Completed GAD-7. Completed PHQ 2/9              Provided counseling support for client.              Used Active Listening skills to allow client to share her feelings and discuss her current needs                Client said she has appointment tomorrow at 1:00 at Medical Oncology. Referral for appointment was made by NP [facility]               Client said she has been SOB and wheezing more recently.  She spoke of use of her inhalers as prescribed              Encouraged client to call LCSW as needed for support at [phone]                 Client was appreciative of call today from LCSW  Patient Goals/Self-Care Activities:  Attend scheduled appointments at [facility]            Take medications as  prescribed             Call RN Faye McLaurin as needed for nursing support             Call LCSW as needed for SW support   Plan:   LCSW to call client on 10/22/2024 at 10:15 AM        Please go to [facility] [address], [place] [phone]) if you are experiencing a Mental Health or Behavioral Health Crisis or need someone to talk to.  Patient verbalized understanding of Care plan and visit instructions communicated this visit   Glendia Pear  MSW, LCSW [place]/Value Based Care [facility] Licensed Clinical Social Worker Direct Dial:  [phone] Fax:  [phone] Website:  [email or web address]

## 2024-09-11 NOTE — Progress Notes (Unsigned)
 " Patient Care Team: Leron Millman, NP as PCP - General (Nurse Practitioner) Sheldon Standing, MD as Consulting Physician (General Surgery) Burnette Fallow, MD as Consulting Physician (Gastroenterology) Lanny Callander, MD as Consulting Physician (Hematology and Oncology) Gordy Channing LABOR, RN as Registered Nurse McLaurin, Nichole LABOR, RN as [facility] Care Management Frances Ozell GORMAN HUGHS as VBCI Care Management (Licensed Clinical Social Worker)  Clinic Day:  09/11/2024  Referring physician: Leron Millman, NP  ASSESSMENT & PLAN:   Assessment & Plan: Cancer of right colon (HCC) -Stage IIA (pT3N0M0), MSS -Patient presented with lower GI bleeding -Status post right hemicolectomy -She is on cancer surveillance with routine lab including Signatera, and CT scan every 6-12 months for first 2-3 years  -12/14/2023 -surveillance colonoscopy per Dr. Burnette.  Prior end-to-side ileocolonic anastomosis in cecum and appendiceal orifice.  Anastomosis is patent and healthy appearing mucosa.  Surveillance colonoscopy due again in 11/2026. - 04/26/2024 - CT CAP showed no evidence of recurrence or metastatic disease in the chest, abdomen, or pelvis. Plan for labs and follow-up in 3 to 4 months, sooner if needed.  Plan to add Signatera to lab draw.    The patient understands the plans discussed today and is in agreement with them.  She knows to contact our office if she develops concerns prior to her next appointment.  I provided *** minutes of face-to-face time during this encounter and > 50% was spent counseling as documented under my assessment and plan.    Powell FORBES Lessen, NP  [facility] - A DEPT OF [facility] [address] [place] [address] Dept: [phone] Dept Fax: [phone]   No orders of the defined types were placed in this encounter.     CHIEF COMPLAINT:  CC: Right colon cancer  Current Treatment: Cancer  surveillance  INTERVAL HISTORY:  Emily Cameron is here today for repeat clinical assessment.  She last saw me on 05/03/2024.  She denies fevers or chills. She denies pain. Her appetite is good. Her weight {Weight change:10426}.  I have reviewed the past medical history, past surgical history, social history and family history with the patient and they are unchanged from previous note.  ALLERGIES:  has no known allergies.  MEDICATIONS:  Current Outpatient Medications  Medication Sig Dispense Refill   albuterol  (VENTOLIN  HFA) 108 (90 Base) MCG/ACT inhaler Inhale into the lungs every 4 (four) hours as needed for wheezing or shortness of breath.     amitriptyline  (ELAVIL ) 50 MG tablet Take 50 mg by mouth at bedtime.     apixaban  (ELIQUIS ) 5 MG TABS tablet Take 1 tablet (5 mg total) by mouth 2 (two) times daily. Start after completion of apixaban  starter pack 60 tablet 2   budesonide-formoterol (SYMBICORT) 160-4.5 MCG/ACT inhaler Inhale 2 puffs into the lungs 2 (two) times daily.     ferrous sulfate  (FEROSUL) 325 (65 FE) MG tablet Take 325 mg by mouth daily with breakfast.     gabapentin  (NEURONTIN ) 600 MG tablet Take 1 tablet (600 mg total) by mouth 3 (three) times daily as needed (pain). 90 tablet 3   mirtazapine  (REMERON ) 30 MG tablet Take 1 tablet (30 mg total) by mouth at bedtime. 30 tablet 3   omeprazole  (PRILOSEC) 40 MG capsule Take 1 capsule (40 mg total) by mouth 2 (two) times daily as needed (acid reflux). (Patient not taking: Reported on 09/11/2024) 30 capsule 3   oxyCODONE -acetaminophen  (PERCOCET) 10-325 MG tablet Take 0.5-1 tablets  by mouth every 6 (six) hours as needed for pain. 20 tablet 0   potassium chloride  (KLOR-CON  M) 10 MEQ tablet Take 1 tablet (10 mEq total) by mouth daily. (Patient not taking: Reported on 09/11/2024) 30 tablet 0   QUEtiapine  (SEROQUEL ) 200 MG tablet Take 1 tablet (200 mg total) by mouth at bedtime. (Patient not taking: Reported on 09/11/2024) 30 tablet 3    Tetrahydrozoline HCl (VISINE OP) Place 2 drops into both eyes as needed (for dry eyes). Reported on 09/09/2015 (Patient not taking: Reported on 07/03/2024)     tiZANidine  (ZANAFLEX ) 4 MG capsule Take 4 mg by mouth 3 (three) times daily as needed for muscle spasms. (Patient not taking: Reported on 07/03/2024)     No current facility-administered medications for this visit.    HISTORY OF PRESENT ILLNESS:   Oncology History Overview Note   Cancer Staging  Cancer of right colon [facility]) Staging form: Colon and Rectum, AJCC 8th Edition - Pathologic stage from 10/22/2022: Stage IIA (pT3, pN0, cM0) - Signed by Lanny Callander, MD on 11/07/2022 Stage prefix: Initial diagnosis Total positive nodes: 0 Histologic grading system: 4 grade system Histologic grade (G): G2 Residual tumor (R): R0 - None     Cancer of right colon (HCC)  09/08/2022 Imaging    IMPRESSION: 1. Circumferential mass in the transverse limb of the hepatic flexure consistent with colon cancer. Possible synchronous lesion versus artifact in the rectosigmoid junction. Surgical consultation and/or colonoscopy recommended for further evaluation. 2. No inflammatory changes or obstruction in the bowel. No evidence of acute diverticulitis.     10/05/2022 Initial Diagnosis   Cancer of right colon (HCC)   10/06/2022 Imaging    IMPRESSION: 1. Stable CT of the chest. No specific findings identified to suggest metastatic disease to the chest. 2. Unchanged appearance of scarring, architectural distortion, and volume loss within the lateral left base and apical segment of the right upper lobe. 3. Stable appearance of small right upper lobe lung nodules which are likely postinflammatory in etiology. 4. Coronary artery calcifications. 5. Aortic Atherosclerosis (ICD10-I70.0) and Emphysema (ICD10-J43.9).   10/22/2022 Cancer Staging   Staging form: Colon and Rectum, AJCC 8th Edition - Pathologic stage from 10/22/2022: Stage IIA (pT3, pN0, cM0) -  Signed by Lanny Callander, MD on 11/07/2022 Stage prefix: Initial diagnosis Total positive nodes: 0 Histologic grading system: 4 grade system Histologic grade (G): G2 Residual tumor (R): R0 - None   10/22/2022 Pathology Results    FINAL MICROSCOPIC DIAGNOSIS:  A. COLON, PROXIMAL RIGHT, RESECTION: Invasive colonic adenocarcinoma, 6 cm Carcinoma extends into pericolonic connective tissue (pT3) All surgical margins negative for carcinoma Thirty-nine lymph nodes negative for metastatic carcinoma (0/39) (pN0) Benign appendix See oncology table  ONCOLOGY TABLE: COLON AND RECTUM, CARCINOMA:  Resection Procedure: Right colectomy Tumor Site: Proximal transverse colon Tumor Size: 6 x 4.5 cm Macroscopic Tumor Perforation: Not identified Histologic Type: Colorectal adenocarcinoma, not otherwise specified Histologic Grade: Moderately differentiated, G2 Multiple Primary Sites: Not applicable Tumor Extension: Into pericolonic connective tissue Lymphovascular Invasion: Not identified Perineural Invasion: Not identified Treatment Effect: No known presurgical therapy Margins:      Margin Status for Invasive Carcinoma: All margins negative for carcinoma Regional Lymph Nodes:      Number of Lymph Nodes with Tumor: 0      Number of Lymph Nodes Examined: 39 Tumor Deposits: Not identified Distant Metastasis:      Distant Site(s) Involved: Not applicable Pathologic Stage Classification (pTNM, AJCC 8th Edition): pT3, pN0 Ancillary Studies: MMR and  MSI are pending Representative Tumor Block: A3-A7 (v4.2.0.1)    12/14/2023 Procedure   Colonoscopy  with Dr. Burnette Evidence of prior end-to-side ileocolonic anastomosis in the cecum and appendiceal orifice.  Anastomosis is patent with healthy-appearing mucosa.  Surveillance colonoscopy in 11/2026.    04/26/2024 Imaging   CT CAP with contrast  IMPRESSION: 1. Status post partial right hemicolectomy. 2. No evidence of lymphadenopathy or metastatic disease in  the chest, abdomen, or pelvis. 3. Irregular scarring and architectural distortion of the right pulmonary apex, including a spiculated appearing nodule measuring 0.9 cm. Given well established stability this is most likely benign and incidental sequelae of infection or inflammation. Attention on follow-up. Additional bandlike scarring of the left lung base. 4. Diffuse bilateral bronchial wall thickening and a background of minimal paraseptal emphysema with fine centrilobular nodularity throughout the lungs, consistent with smoking related respiratory bronchiolitis. 5. Coronary artery disease. 6. Nonobstructive right nephrolithiasis.       REVIEW OF SYSTEMS:   Constitutional: Denies fevers, chills or abnormal weight loss Eyes: Denies blurriness of vision Ears, nose, mouth, throat, and face: Denies mucositis or sore throat Respiratory: Denies cough, dyspnea or wheezes Cardiovascular: Denies palpitation, chest discomfort or lower extremity swelling Gastrointestinal:  Denies nausea, heartburn or change in bowel habits Skin: Denies abnormal skin rashes Lymphatics: Denies new lymphadenopathy or easy bruising Neurological:Denies numbness, tingling or new weaknesses Behavioral/Psych: Mood is stable, no new changes  All other systems were reviewed with the patient and are negative.   VITALS:  Last menstrual period 01/26/2004.  Wt Readings from Last 3 Encounters:  05/12/24 125 lb (56.7 kg)  05/03/24 124 lb (56.2 kg)  11/02/23 135 lb 8 oz (61.5 kg)    There is no height or weight on file to calculate BMI.  Performance status (ECOG): {CHL ONC [id number]  PHYSICAL EXAM:   GENERAL:alert, no distress and comfortable SKIN: skin color, texture, turgor are normal, no rashes or significant lesions EYES: normal, Conjunctiva are pink and non-injected, sclera clear OROPHARYNX:no exudate, no erythema and lips, buccal mucosa, and tongue normal  NECK: supple, thyroid  normal size, non-tender,  without nodularity LYMPH:  no palpable lymphadenopathy in the cervical, axillary or inguinal LUNGS: clear to auscultation and percussion with normal breathing effort HEART: regular rate & rhythm and no murmurs and no lower extremity edema ABDOMEN:abdomen soft, non-tender and normal bowel sounds Musculoskeletal:no cyanosis of digits and no clubbing  NEURO: alert & oriented x 3 with fluent speech, no focal motor/sensory deficits  LABORATORY DATA:  I have reviewed the data as listed    Component Value Date/Time   NA 142 05/12/2024 1833   NA 142 05/11/2018 1529   K 2.9 (L) 05/12/2024 1833   CL 105 05/12/2024 1833   CO2 28 04/26/2024 1017   GLUCOSE 83 05/12/2024 1833   BUN 3 (L) 05/12/2024 1833   BUN 10 05/11/2018 1529   CREATININE 0.80 05/12/2024 1833   CREATININE 0.73 04/26/2024 1017   CREATININE 0.68 09/12/2014 1240   CALCIUM  9.2 04/26/2024 1017   PROT 7.8 04/26/2024 1017   PROT 7.1 05/11/2018 1529   ALBUMIN 4.0 04/26/2024 1017   ALBUMIN 4.2 05/11/2018 1529   AST 13 (L) 04/26/2024 1017   ALT 13 04/26/2024 1017   ALKPHOS 73 04/26/2024 1017   BILITOT 0.6 04/26/2024 1017   GFRNONAA >60 04/26/2024 1017   GFRNONAA >89 09/24/2013 1500   GFRAA 112 05/11/2018 1529   GFRAA >89 09/24/2013 1500    No results found for: SPEP, UPEP  Lab  Results  Component Value Date   WBC 8.5 05/12/2024   NEUTROABS 6.1 05/12/2024   HGB 13.6 05/12/2024   HCT 40.0 05/12/2024   MCV 92.8 05/12/2024   PLT 204 05/12/2024      Chemistry      Component Value Date/Time   NA 142 05/12/2024 1833   NA 142 05/11/2018 1529   K 2.9 (L) 05/12/2024 1833   CL 105 05/12/2024 1833   CO2 28 04/26/2024 1017   BUN 3 (L) 05/12/2024 1833   BUN 10 05/11/2018 1529   CREATININE 0.80 05/12/2024 1833   CREATININE 0.73 04/26/2024 1017   CREATININE 0.68 09/12/2014 1240      Component Value Date/Time   CALCIUM  9.2 04/26/2024 1017   ALKPHOS 73 04/26/2024 1017   AST 13 (L) 04/26/2024 1017   ALT 13 04/26/2024  1017   BILITOT 0.6 04/26/2024 1017       RADIOGRAPHIC STUDIES: I have personally reviewed the radiological images as listed and agreed with the findings in the report. No results found. "

## 2024-09-11 NOTE — Assessment & Plan Note (Signed)
-  Stage IIA (pT3N0M0), MSS -Patient presented with lower GI bleeding -Status post right hemicolectomy -She is on cancer surveillance with routine lab including Signatera, and CT scan every 6-12 months for first 2-3 years  -12/14/2023 -surveillance colonoscopy per Dr. Burnette.  Prior end-to-side ileocolonic anastomosis in cecum and appendiceal orifice.  Anastomosis is patent and healthy appearing mucosa.  Surveillance colonoscopy due again in 11/2026. - 04/26/2024 - CT CAP showed no evidence of recurrence or metastatic disease in the chest, abdomen, or pelvis. Plan for labs and follow-up in 3 to 4 months, sooner if needed.  Plan to add Signatera to lab draw.

## 2024-09-12 ENCOUNTER — Inpatient Hospital Stay: Admitting: Nurse Practitioner

## 2024-09-12 ENCOUNTER — Other Ambulatory Visit: Payer: Self-pay

## 2024-09-12 ENCOUNTER — Inpatient Hospital Stay

## 2024-09-12 DIAGNOSIS — C182 Malignant neoplasm of ascending colon: Secondary | ICD-10-CM

## 2024-09-12 NOTE — Progress Notes (Signed)
 Verbal order w/readback from Powell Lessen, NP for Signatera to be drawn today.  Order placed in EPIC and requisition given to [facility].

## 2024-09-19 ENCOUNTER — Telehealth: Payer: Self-pay | Admitting: *Deleted

## 2024-09-24 ENCOUNTER — Other Ambulatory Visit: Payer: Self-pay | Admitting: Nurse Practitioner

## 2024-09-24 DIAGNOSIS — C182 Malignant neoplasm of ascending colon: Secondary | ICD-10-CM

## 2024-09-26 ENCOUNTER — Inpatient Hospital Stay: Admitting: Nurse Practitioner

## 2024-09-26 ENCOUNTER — Inpatient Hospital Stay

## 2024-09-26 ENCOUNTER — Encounter: Payer: Self-pay | Admitting: Nurse Practitioner

## 2024-09-26 VITALS — BP 135/85 | HR 73 | Temp 98.3°F | Resp 17 | Wt 139.1 lb

## 2024-09-26 DIAGNOSIS — C182 Malignant neoplasm of ascending colon: Secondary | ICD-10-CM

## 2024-09-26 LAB — CMP (CANCER CENTER ONLY)
ALT: 22 U/L (ref 0–44)
AST: 24 U/L (ref 15–41)
Albumin: 3.9 g/dL (ref 3.5–5.0)
Alkaline Phosphatase: 84 U/L (ref 38–126)
Anion gap: 10 (ref 5–15)
BUN: 13 mg/dL (ref 8–23)
CO2: 23 mmol/L (ref 22–32)
Calcium: 8.8 mg/dL — ABNORMAL LOW (ref 8.9–10.3)
Chloride: 107 mmol/L (ref 98–111)
Creatinine: 0.86 mg/dL (ref 0.44–1.00)
GFR, Estimated: >60 mL/min
Glucose, Bld: 91 mg/dL (ref 70–99)
Potassium: 3.4 mmol/L — ABNORMAL LOW (ref 3.5–5.1)
Sodium: 141 mmol/L (ref 135–145)
Total Bilirubin: 0.3 mg/dL (ref 0.0–1.2)
Total Protein: 7.3 g/dL (ref 6.5–8.1)

## 2024-09-26 LAB — CBC WITH DIFFERENTIAL (CANCER CENTER ONLY)
Abs Immature Granulocytes: 0.01 10*3/uL (ref 0.00–0.07)
Basophils Absolute: 0 10*3/uL (ref 0.0–0.1)
Basophils Relative: 0 %
Eosinophils Absolute: 0.3 10*3/uL (ref 0.0–0.5)
Eosinophils Relative: 4 %
HCT: 37.1 % (ref 36.0–46.0)
Hemoglobin: 13.2 g/dL (ref 12.0–15.0)
Immature Granulocytes: 0 %
Lymphocytes Relative: 37 %
Lymphs Abs: 2.6 10*3/uL (ref 0.7–4.0)
MCH: 31.9 pg (ref 26.0–34.0)
MCHC: 35.6 g/dL (ref 30.0–36.0)
MCV: 89.6 fL (ref 80.0–100.0)
Monocytes Absolute: 0.8 10*3/uL (ref 0.1–1.0)
Monocytes Relative: 11 %
Neutro Abs: 3.3 10*3/uL (ref 1.7–7.7)
Neutrophils Relative %: 48 %
Platelet Count: 211 10*3/uL (ref 150–400)
RBC: 4.14 MIL/uL (ref 3.87–5.11)
RDW: 13.5 % (ref 11.5–15.5)
WBC Count: 7 10*3/uL (ref 4.0–10.5)
nRBC: 0 % (ref 0.0–0.2)

## 2024-09-26 LAB — CEA (ACCESS): CEA (CHCC): 5.37 ng/mL — ABNORMAL HIGH (ref 0.00–5.00)

## 2024-09-26 LAB — GENETIC SCREENING ORDER

## 2024-09-26 MED ORDER — POTASSIUM CHLORIDE CRYS ER 10 MEQ PO TBCR: 10.0000 meq | EXTENDED_RELEASE_TABLET | Freq: Every day | ORAL | 2 refills | Status: AC

## 2024-10-22 ENCOUNTER — Telehealth: Admitting: Licensed Clinical Social Worker

## 2024-11-07 ENCOUNTER — Telehealth: Payer: Self-pay | Admitting: *Deleted

## 2024-12-24 ENCOUNTER — Inpatient Hospital Stay

## 2024-12-24 ENCOUNTER — Inpatient Hospital Stay: Admitting: Nurse Practitioner
